# Patient Record
Sex: Male | Born: 1955
Health system: Southern US, Community
[De-identification: ages and names within clinical notes are randomized; demographics above are authoritative.]

## PROBLEM LIST (undated history)

## (undated) DIAGNOSIS — F410 Panic disorder [episodic paroxysmal anxiety] without agoraphobia: Secondary | ICD-10-CM

## (undated) DIAGNOSIS — Z85828 Personal history of other malignant neoplasm of skin: Secondary | ICD-10-CM

## (undated) DIAGNOSIS — F329 Major depressive disorder, single episode, unspecified: Secondary | ICD-10-CM

## (undated) DIAGNOSIS — K219 Gastro-esophageal reflux disease without esophagitis: Secondary | ICD-10-CM

## (undated) DIAGNOSIS — J849 Interstitial pulmonary disease, unspecified: Secondary | ICD-10-CM

## (undated) DIAGNOSIS — I719 Aortic aneurysm of unspecified site, without rupture: Secondary | ICD-10-CM

## (undated) DIAGNOSIS — I10 Essential (primary) hypertension: Secondary | ICD-10-CM

## (undated) DIAGNOSIS — E119 Type 2 diabetes mellitus without complications: Secondary | ICD-10-CM

## (undated) DIAGNOSIS — F32A Depression, unspecified: Secondary | ICD-10-CM

## (undated) DIAGNOSIS — E785 Hyperlipidemia, unspecified: Secondary | ICD-10-CM

## (undated) DIAGNOSIS — J189 Pneumonia, unspecified organism: Secondary | ICD-10-CM

## (undated) DIAGNOSIS — J449 Chronic obstructive pulmonary disease, unspecified: Secondary | ICD-10-CM

## (undated) DIAGNOSIS — J439 Emphysema, unspecified: Secondary | ICD-10-CM

## (undated) HISTORY — DX: Gastro-esophageal reflux disease without esophagitis: K21.9

## (undated) HISTORY — DX: Essential (primary) hypertension: I10

## (undated) HISTORY — DX: Chronic obstructive pulmonary disease, unspecified: J44.9

## (undated) HISTORY — DX: Interstitial pulmonary disease, unspecified: J84.9

## (undated) HISTORY — DX: Hyperlipidemia, unspecified: E78.5

## (undated) HISTORY — DX: Personal history of other malignant neoplasm of skin: Z85.828

## (undated) HISTORY — DX: Panic disorder (episodic paroxysmal anxiety): F41.0

## (undated) HISTORY — DX: Depression, unspecified: F32.A

## (undated) HISTORY — PX: OTHER SURGICAL HISTORY: SHX169

## (undated) HISTORY — DX: Type 2 diabetes mellitus without complications: E11.9

## (undated) HISTORY — DX: Major depressive disorder, single episode, unspecified: F32.9

## (undated) HISTORY — PX: HERNIA REPAIR: SHX51

---

## 1998-09-18 ENCOUNTER — Ambulatory Visit (HOSPITAL_COMMUNITY): Admission: RE | Admit: 1998-09-18 | Discharge: 1998-09-18 | Payer: Self-pay | Admitting: Psychiatry

## 2005-10-16 LAB — HM COLONOSCOPY: HM Colonoscopy: NORMAL

## 2005-11-09 ENCOUNTER — Inpatient Hospital Stay (HOSPITAL_COMMUNITY): Admission: EM | Admit: 2005-11-09 | Discharge: 2005-11-10 | Payer: Self-pay | Admitting: Emergency Medicine

## 2005-11-15 ENCOUNTER — Ambulatory Visit (HOSPITAL_COMMUNITY): Admission: RE | Admit: 2005-11-15 | Discharge: 2005-11-15 | Payer: Self-pay | Admitting: Interventional Cardiology

## 2006-12-31 ENCOUNTER — Ambulatory Visit (HOSPITAL_COMMUNITY): Admission: RE | Admit: 2006-12-31 | Discharge: 2006-12-31 | Payer: Self-pay | Admitting: Family Medicine

## 2008-07-16 HISTORY — PX: BRONCHOSCOPY: SUR163

## 2008-08-03 ENCOUNTER — Inpatient Hospital Stay (HOSPITAL_COMMUNITY): Admission: EM | Admit: 2008-08-03 | Discharge: 2008-08-21 | Payer: Self-pay | Admitting: Emergency Medicine

## 2008-08-03 ENCOUNTER — Ambulatory Visit: Payer: Self-pay | Admitting: Pulmonary Disease

## 2008-08-12 ENCOUNTER — Ambulatory Visit: Payer: Self-pay | Admitting: Cardiovascular Disease

## 2008-08-12 ENCOUNTER — Encounter (INDEPENDENT_AMBULATORY_CARE_PROVIDER_SITE_OTHER): Payer: Self-pay | Admitting: Pulmonary Disease

## 2008-08-26 ENCOUNTER — Ambulatory Visit: Payer: Self-pay | Admitting: Pulmonary Disease

## 2008-08-26 DIAGNOSIS — E119 Type 2 diabetes mellitus without complications: Secondary | ICD-10-CM

## 2008-08-26 DIAGNOSIS — J8410 Pulmonary fibrosis, unspecified: Secondary | ICD-10-CM | POA: Insufficient documentation

## 2008-08-26 DIAGNOSIS — R0902 Hypoxemia: Secondary | ICD-10-CM

## 2008-08-26 DIAGNOSIS — I10 Essential (primary) hypertension: Secondary | ICD-10-CM

## 2008-08-26 DIAGNOSIS — J9691 Respiratory failure, unspecified with hypoxia: Secondary | ICD-10-CM | POA: Insufficient documentation

## 2008-09-16 ENCOUNTER — Telehealth (INDEPENDENT_AMBULATORY_CARE_PROVIDER_SITE_OTHER): Payer: Self-pay | Admitting: *Deleted

## 2008-09-23 ENCOUNTER — Ambulatory Visit: Payer: Self-pay | Admitting: Pulmonary Disease

## 2008-09-23 DIAGNOSIS — J449 Chronic obstructive pulmonary disease, unspecified: Secondary | ICD-10-CM

## 2008-09-23 HISTORY — DX: Chronic obstructive pulmonary disease, unspecified: J44.9

## 2008-09-30 ENCOUNTER — Ambulatory Visit: Payer: Self-pay | Admitting: Pulmonary Disease

## 2008-09-30 ENCOUNTER — Ambulatory Visit: Payer: Self-pay | Admitting: Internal Medicine

## 2008-12-07 ENCOUNTER — Ambulatory Visit: Payer: Self-pay | Admitting: Pulmonary Disease

## 2008-12-07 DIAGNOSIS — J438 Other emphysema: Secondary | ICD-10-CM | POA: Insufficient documentation

## 2008-12-16 ENCOUNTER — Telehealth (INDEPENDENT_AMBULATORY_CARE_PROVIDER_SITE_OTHER): Payer: Self-pay | Admitting: *Deleted

## 2009-02-08 ENCOUNTER — Ambulatory Visit: Payer: Self-pay | Admitting: Pulmonary Disease

## 2009-03-11 ENCOUNTER — Ambulatory Visit: Payer: Self-pay | Admitting: Pulmonary Disease

## 2009-04-22 ENCOUNTER — Ambulatory Visit: Payer: Self-pay | Admitting: Pulmonary Disease

## 2009-07-21 ENCOUNTER — Ambulatory Visit: Payer: Self-pay | Admitting: Pulmonary Disease

## 2009-08-24 ENCOUNTER — Ambulatory Visit: Payer: Self-pay | Admitting: Pulmonary Disease

## 2009-09-13 ENCOUNTER — Encounter: Payer: Self-pay | Admitting: Pulmonary Disease

## 2009-11-17 ENCOUNTER — Ambulatory Visit: Payer: Self-pay | Admitting: Pulmonary Disease

## 2010-04-20 ENCOUNTER — Ambulatory Visit: Payer: Self-pay | Admitting: Pulmonary Disease

## 2010-04-27 ENCOUNTER — Encounter: Payer: Self-pay | Admitting: Pulmonary Disease

## 2010-04-28 ENCOUNTER — Encounter: Payer: Self-pay | Admitting: Pulmonary Disease

## 2010-05-05 ENCOUNTER — Ambulatory Visit: Payer: Self-pay | Admitting: Internal Medicine

## 2010-10-18 ENCOUNTER — Ambulatory Visit
Admission: RE | Admit: 2010-10-18 | Discharge: 2010-10-18 | Payer: Self-pay | Source: Home / Self Care | Attending: Pulmonary Disease | Admitting: Pulmonary Disease

## 2010-10-18 DIAGNOSIS — R079 Chest pain, unspecified: Secondary | ICD-10-CM | POA: Insufficient documentation

## 2010-11-17 NOTE — Assessment & Plan Note (Signed)
Summary: 4-6 months/apc   Visit Type:  Follow-up  CC:  Emphysema.  ILD.  The patient c/o increased sob and dizziness. Cough with clear mucus that is worse in the mornings. The patient says that he has had a few episodes when first waking in the mornings with SOB.Marland Kitchen  History of Present Illness: I saw [name] in follow up for his pneumonitis and COPD/emphysema.  He has occasional cough with clear sputum.  He denies fever, sweats, hemoptysis, or chest pain.  He gets an occasional whistle in his chest.  He is not using combivent much, but this helps when he uses it.  He is not smoking.  He uses his oxygen with exertion, and this helps.  He has not seen his primary care physician for more than one year, and his previous PCP is no longer practicing in the [place] area.   Current Medications (verified): 1)  Combivent 103-18 Mcg/act Aero (Ipratropium-Albuterol) .... As Needed 2)  Adult Aspirin Ec Low Strength 81 Mg Tbec (Aspirin) .... Once Daily 3)  Hydrochlorothiazide 25 Mg Tabs (Hydrochlorothiazide) .Marland Kitchen.. 1 By Mouth Daily 4)  Oxygen .... Continuous 1.5 Liters With Activity  Allergies (verified): 1)  ! * Latex 2)  ! Morphine 3)  ! * Dalmamine 4)  ! * Statins  Past History:  Past Medical History: Reviewed history from 03/11/2009 and no changes required. HTN      - Echo 10/09 EF 60% Dyslipidemia GERD Panic attacks ILD COPD      - PFT 09/23/08 FEV1 2.89 (84%), FVC 3.70 (78%), TLC 5.28 (78%), DLCO 64%;mixed obstructive and        restrictive pattern     - Spirometry 03/11/09 FEV1 3.35 (97%), FVC 4.10 (86%), FEV1% 82 Steroid responsive pneumonitis 04/09     - required mechanical ventilation during hospitalization     - The following tests during his hospitalization were negative: PCP, ANA, HIV, BAL     - ACE 25, RF 20, CRP 28, A1AT 263, ESR 109 Depression  Past Surgical History: Reviewed history from 08/26/2008 and no changes required. 2 hernia Left shoulder Ganglion cyst on  foot skin cancer Ear surgery Bronchoscopy 10/09  Vital Signs:  Patient profile:   55 year old male Height:      70 inches (177.80 cm) Weight:      217 pounds (98.64 kg) BMI:     31.25 O2 Sat:      97 % on 1.5 L/min Temp:     98.1 degrees F (36.72 degrees C) oral Pulse rate:   119 / minute BP sitting:   120 / 80  (right arm) Cuff size:   regular  Vitals Entered By: Michel Bickers CMA (April 20, 2010 2:53 PM)  O2 Sat at Rest %:  97 O2 Flow:  1.5 L/min CC: Emphysema.  ILD.  The patient c/o increased sob and dizziness. Cough with clear mucus that is worse in the mornings. The patient says that he has had a few episodes when first waking in the mornings with SOB. Comments Medications reviewed and phone number verified. Michel Bickers CMA  April 20, 2010 2:54 PM   Physical Exam  General:  on supplemental oxygen and obese.   Nose:  Narrow nasal angle, no discharge. Mouth:  no deformity or lesions Neck:  no JVD.   Lungs:  Decreased breath sounds, shallow respirations, coarse breath sounds, no wheezing Heart:  regular rhythm, normal rate, and no murmurs.   Extremities:  no clubbing, cyanosis,  edema, or deformity noted Cervical Nodes:  no significant adenopathy   Impression & Recommendations:  Problem # 1:  EMPHYSEMA (ICD-492.8)  Continue on as needed combivent.  Orders: Est. Patient Level III (16109) Primary Care Referral (Primary)  Problem # 2:  INTERSTITIAL LUNG DISEASE (ICD-515)  Stable.  Continue clinical observation.  Problem # 3:  HYPOXEMIA (ICD-799.02)  continue oxygen 1.5 liters as needed with exertion.  will re-assess need for home oxygen at next follow up.  Orders: Est. Patient Level III (60454) Primary Care Referral (Primary)  Problem # 4:  HYPERTENSION (ICD-401.9) he no longer has a primary care physician.  will see if he can get set up with a PCP with [facility].  Complete Medication List: 1)  Combivent 103-18 Mcg/act Aero (Ipratropium-albuterol) .... As  needed 2)  Adult Aspirin Ec Low Strength 81 Mg Tbec (Aspirin) .... Once daily 3)  Hydrochlorothiazide 25 Mg Tabs (Hydrochlorothiazide) .Marland Kitchen.. 1 by mouth daily 4)  Oxygen  .... Continuous 1.5 liters with activity  Patient Instructions: 1)  Will arrange for referral to primary care doctor 2)  Combivent two puffs up to four times per day as needed  3)  Follow up in 6 months

## 2010-11-17 NOTE — Procedures (Signed)
Summary: Oximetry/[facility]  Oximetry/[facility]   Imported By: Sherian Rein 05/12/2010 09:52:55  _____________________________________________________________________  External Attachment:    Type:   Image     Comment:   External Document

## 2010-11-17 NOTE — Assessment & Plan Note (Signed)
Summary: rov ////kp   Primary Provider/Referring Provider:  Ace Gins  CC:  Pt here for follow up. Pt states no improvement from last OV. Pt c/o S.O.B with activity.  History of Present Illness: I saw [name] in follow up for his pneumonitis and COPD/emphysema.  His breathing has been about the same.  He gets occasional cough and wheeze.  He brings up clear sputum in the morning.  He denies fever, chest pain, or hemoptysis.  He uses his combivent about 3 or 4 times per week.  He does get winded when he exerts himself too much.     Current Medications (verified): 1)  Combivent 103-18 Mcg/act Aero (Ipratropium-Albuterol) .... As Needed 2)  Prilosec Otc 20 Mg Tbec (Omeprazole Magnesium) .... Once Daily As Needed 3)  Adult Aspirin Ec Low Strength 81 Mg Tbec (Aspirin) .... Once Daily 4)  Hydrochlorothiazide 25 Mg Tabs (Hydrochlorothiazide) .Marland Kitchen.. 1 By Mouth Daily 5)  Oxygen .... Continuous 1.5 Liters With Activity  Allergies (verified): 1)  ! * Latex 2)  ! Morphine 3)  ! * Dalmamine 4)  ! * Statins  Past History:  Past Medical History: Reviewed history from 03/11/2009 and no changes required. HTN      - Echo 10/09 EF 60% Dyslipidemia GERD Panic attacks ILD COPD      - PFT 09/23/08 FEV1 2.89 (84%), FVC 3.70 (78%), TLC 5.28 (78%), DLCO 64%;mixed obstructive and        restrictive pattern     - Spirometry 03/11/09 FEV1 3.35 (97%), FVC 4.10 (86%), FEV1% 82 Steroid responsive pneumonitis 04/09     - required mechanical ventilation during hospitalization     - The following tests during his hospitalization were negative: PCP, ANA, HIV, BAL     - ACE 25, RF 20, CRP 28, A1AT 263, ESR 109 Depression  Past Surgical History: Reviewed history from 08/26/2008 and no changes required. 2 hernia Left shoulder Ganglion cyst on foot skin cancer Ear surgery Bronchoscopy 10/09  Vital Signs:  Patient profile:   55 year old male Height:      70 inches Weight:      223.13  pounds O2 Sat:      97 % on Room air Temp:     98.9 degrees F oral Pulse rate:   108 / minute BP sitting:   148 / 92  (left arm) Cuff size:   regular  Vitals Entered By: Zackery Barefoot CMA (November 17, 2009 1:35 PM)  O2 Flow:  Room air CC: Pt here for follow up. Pt states no improvement from last OV. Pt c/o S.O.B with activity Comments Medications reviewed with patient Verified pt's contact number Zackery Barefoot CMA  November 17, 2009 1:39 PM   Ambulatory Pulse Oximetry  Resting; HR_110____    02 Sat_96%ra____  Lap1 (185 feet)   HR__95___   02 Sat__93%ra___ Lap2 (185 feet)   HR__106___   02 Sat__95%ra___    Lap3 (185 feet)   HR__100___   02 Sat__95%ra___  _x__Test Completed without Difficulty ___Test Stopped due to:  Gweneth Dimitri RN  November 17, 2009 1:56 PM    Physical Exam  Nose:  Narrow nasal angle, no discharge. Mouth:  no deformity or lesions Neck:  no JVD.   Lungs:  Decreased breath sounds, shallow respirations, coarse breath sounds, no wheezing Heart:  regular rhythm, normal rate, and no murmurs.   Abdomen:  bowel sounds positive; abdomen soft and non-tender without masses, or organomegaly Extremities:  no  clubbing, cyanosis, edema, or deformity noted Cervical Nodes:  no significant adenopathy   Impression & Recommendations:  Problem # 1:  EMPHYSEMA (ICD-492.8)  Continue on as needed combivent.  Problem # 2:  INTERSTITIAL LUNG DISEASE (ICD-515)  Stable.  Continue clinical observation.  Problem # 3:  HYPOXEMIA (ICD-799.02) Oxygen level on room air was okay at rest and exertion.  Will arrange for ONO on room air.  Medications Added to Medication List This Visit: 1)  Combivent 103-18 Mcg/act Aero (Ipratropium-albuterol) .... As needed 2)  Oxygen  .... Continuous 1.5 liters with activity  Complete Medication List: 1)  Combivent 103-18 Mcg/act Aero (Ipratropium-albuterol) .... As needed 2)  Prilosec Otc 20 Mg Tbec (Omeprazole magnesium) .... Once  daily as needed 3)  Adult Aspirin Ec Low Strength 81 Mg Tbec (Aspirin) .... Once daily 4)  Hydrochlorothiazide 25 Mg Tabs (Hydrochlorothiazide) .Marland Kitchen.. 1 by mouth daily 5)  Oxygen  .... Continuous 1.5 liters with activity  Other Orders: Est. Patient Level III (16109) DME Referral (DME)  Patient Instructions: 1)  Follow up in 4 to 6 months   Immunization History:  Pneumovax Immunization History:    Pneumovax:  historical (08/17/2008)

## 2010-11-17 NOTE — Assessment & Plan Note (Signed)
Summary: NEW/ NO INSU/ $184/[facility]WS  #   Vital Signs:  Patient profile:   55 year old male Height:      70 inches Weight:      214.13 pounds BMI:     30.84 O2 Sat:      98 % on 2 L/min Temp:     98.0 degrees F oral Pulse rate:   80 / minute Pulse rhythm:   regular Resp:     16 per minute BP sitting:   118 / 80  (left arm) Cuff size:   large  Vitals Entered By: Rock Nephew CMA (May 05, 2010 4:41 PM)  Nutrition Counseling: Patient's BMI is greater than 25 and therefore counseled on weight management options.  O2 Flow:  2 L/min  Primary Care Provider:  Etta Grandchild MD   History of Present Illness:  Hypertension Follow-Up      This is a 55 year old man who presents for Hypertension follow-up.  The patient denies lightheadedness, urinary frequency, headaches, edema, rash, and fatigue.  Associated symptoms include exercise intolerance.  The patient denies the following associated symptoms: chest pain, chest pressure, dyspnea, palpitations, syncope, leg edema, and pedal edema.  Compliance with medications (by patient report) has been near 100%.  The patient reports that dietary compliance has been good.  The patient reports exercising 3-4X per week.  Adjunctive measures currently used by the patient include salt restriction and relaxation.    Preventive Screening-Counseling & Management  Alcohol-Tobacco     Alcohol drinks/day: <1     Alcohol type: all     >5/day in last 3 mos: no     Alcohol Counseling: not indicated; use of alcohol is not excessive or problematic     Feels need to cut down: no     Feels annoyed by complaints: no     Feels guilty re: drinking: no     Needs 'eye opener' in am: no     Smoking Status: quit     Year Quit: 2005     Pack years: 15y 1pk a day or more     Tobacco Counseling: to remain off tobacco products  Caffeine-Diet-Exercise     Does Patient Exercise: yes  Hep-HIV-STD-Contraception     Hepatitis Risk: no risk noted     HIV Risk: no risk  noted     STD Risk: no risk noted      Drug Use:  no.        Blood Transfusions:  no.    Medications Prior to Update: 1)  Combivent 103-18 Mcg/act Aero (Ipratropium-Albuterol) .... As Needed 2)  Adult Aspirin Ec Low Strength 81 Mg Tbec (Aspirin) .... Once Daily 3)  Hydrochlorothiazide 25 Mg Tabs (Hydrochlorothiazide) .Marland Kitchen.. 1 By Mouth Daily 4)  Oxygen .... Continuous 1.5 Liters With Activity  Current Medications (verified): 1)  Combivent 103-18 Mcg/act Aero (Ipratropium-Albuterol) .... As Needed 2)  Adult Aspirin Ec Low Strength 81 Mg Tbec (Aspirin) .... Once Daily 3)  Hydrochlorothiazide 25 Mg Tabs (Hydrochlorothiazide) .Marland Kitchen.. 1 By Mouth Daily 4)  Oxygen .... Continuous 1.5 Liters With Activity  Allergies (verified): 1)  ! * Latex 2)  ! Morphine 3)  ! * Dalmamine 4)  ! * Statins  Past History:  Past Medical History: Last updated: 03/11/2009 HTN      - Echo 10/09 EF 60% Dyslipidemia GERD Panic attacks ILD COPD      - PFT 09/23/08 FEV1 2.89 (84%), FVC 3.70 (78%), TLC 5.28 (78%), DLCO 64%;mixed  obstructive and        restrictive pattern     - Spirometry 03/11/09 FEV1 3.35 (97%), FVC 4.10 (86%), FEV1% 82 Steroid responsive pneumonitis 04/09     - required mechanical ventilation during hospitalization     - The following tests during his hospitalization were negative: PCP, ANA, HIV, BAL     - ACE 25, RF 20, CRP 28, A1AT 263, ESR 109 Depression  Past Surgical History: Last updated: 08/26/2008 2 hernia Left shoulder Ganglion cyst on foot skin cancer Ear surgery Bronchoscopy 10/09  Family History: Last updated: 12/07/2008 Mother - 1, fire accident Father - [place] age 67 Brother - [place] Sister - allergy, asthma Brother - Emphysema  Social History: Last updated: 05/05/2010 Divorced.  No children. 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 Mother - 22, fire accident Father - MI age 29 Brother - MI Sister - allergy, asthma Brother - Emphysema  Social History: Reviewed history from 08/26/2008 and no changes required. Divorced.  No children. 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  The patient denies anorexia, fever, weight loss, weight gain, chest pain, prolonged cough, headaches, hemoptysis, abdominal pain, hematuria, difficulty walking, and depression.    Physical Exam  General:  alert, well-developed, well-nourished, well-hydrated, appropriate dress, normal appearance, and healthy-appearing.   Head:  normocephalic and atraumatic.   Mouth:  no deformity or lesions Neck:  no JVD.   Lungs:  Decreased breath sounds, shallow respirations, coarse breath sounds, no wheezing Heart:  regular rhythm, normal rate, and no murmurs.   Abdomen:  bowel sounds positive; abdomen soft and non-tender without masses, or organomegaly Msk:  normal ROM, no joint tenderness, no joint swelling, no joint warmth, and no redness over joints.   Pulses:  pulses normal Extremities:  no clubbing, cyanosis, edema, or deformity noted Skin:  turgor normal, color normal, no rashes, no suspicious lesions, no ecchymoses, no petechiae, no purpura, no ulcerations, and no edema.   Cervical Nodes:  no anterior cervical adenopathy and no posterior cervical adenopathy.   Psych:  Cognition and judgment appear intact. Alert and cooperative with normal attention span and concentration. No apparent delusions, illusions, hallucinations  Diabetes  Management Exam:    Foot Exam (with socks and/or shoes not present):       Sensory-Pinprick/Light touch:          Left medial foot (L-4): normal          Left dorsal foot (L-5): normal          Left lateral foot (S-1): normal          Right medial foot (L-4): normal          Right dorsal foot (L-5): normal          Right lateral foot (S-1): normal       Sensory-Monofilament:          Left foot: normal          Right foot: normal  Inspection:          Left foot: normal          Right foot: normal       Nails:          Left foot: normal          Right foot: normal   Impression & Recommendations:  Problem # 1:  HYPERTENSION (ICD-401.9) Assessment Improved  His updated medication list for this problem includes:    Hydrochlorothiazide 25 Mg Tabs (Hydrochlorothiazide) .Marland Kitchen... 1 by mouth daily  BP today: 118/80 Prior BP: 120/80 (04/20/2010)  Problem # 2:  EMPHYSEMA (ICD-492.8) Assessment: Unchanged  Complete Medication List: 1)  Combivent 103-18 Mcg/act Aero (Ipratropium-albuterol) .... As needed 2)  Adult Aspirin Ec Low Strength 81 Mg Tbec (Aspirin) .... Once daily 3)  Hydrochlorothiazide 25 Mg Tabs (Hydrochlorothiazide) .Marland Kitchen.. 1 by mouth daily 4)  Oxygen  .... Continuous 1.5 liters with activity  Patient Instructions: 1)  Please schedule a follow-up appointment in 2 months. 2)  Check your blood sugars regularly. If your readings are usually above 200  or below 70 you should contact our office. 3)  It is important that your Diabetic A1c level is checked every 3 months. 4)  See your eye doctor yearly to check for diabetic eye damage. 5)  Check your feet each night for sore areas, calluses or signs of infection. 6)  Check your Blood Pressure regularly. If it is above 130/80: you should make an appointment. Prescriptions: COMBIVENT 103-18 MCG/ACT AERO (IPRATROPIUM-ALBUTEROL) as needed  #15 Gram x 11   Entered and Authorized by:   Etta Grandchild MD   Signed by:   Etta Grandchild MD on 05/05/2010   Method used:   Print then Give to Patient   RxID:   [id number]   Preventive Care Screening  Last Tetanus Booster:    Date:  10/16/2005    Results:  Historical   Colonoscopy:    Date:  10/16/2005    Results:  normal

## 2010-11-17 NOTE — Assessment & Plan Note (Signed)
Summary: f/u 6 months///kp   Primary Provider/Referring Provider:  Etta Grandchild MD  CC:  6 month follow up. Pt c/o intermittent sharp to sore chest discomfort, breathing is the same but has increased use of Combivent and oxygen liters, and dizziness.  History of Present Illness: I saw [name] in follow up for his pneumonitis and COPD/emphysema.  He developed allergies and cough with sputum about one month ago.  He did not seek therapy for this.  While he was having this he developed pleuritic type chest pain which would typical last a few minutes.  His cough and chest pain have improved.  He was also having fever and sinus congestion, but these have improved also.  He denies hemoptysis.  He is using his combivent 3 or 4 times per week.  He uses his oxygen with exeriton.  He has gotten approval for medicare, and this will take effect in April 2012.  Preventive Screening-Counseling & Management  Alcohol-Tobacco     Alcohol drinks/day: <1     Alcohol type: all     Smoking Status: quit     Year Quit: 2005     Pack years: 15y 1pk a day or more  Current Medications (verified): 1)  Combivent 103-18 Mcg/act Aero (Ipratropium-Albuterol) .... As Needed 2)  Adult Aspirin Ec Low Strength 81 Mg Tbec (Aspirin) .... Once Daily 3)  Hydrochlorothiazide 25 Mg Tabs (Hydrochlorothiazide) .Marland Kitchen.. 1 By Mouth Daily 4)  Oxygen .... Continuous 1.5 Liters With Activity  Allergies (verified): 1)  ! * Latex 2)  ! Morphine 3)  ! * Dalmamine 4)  ! * Statins  Past History:  Past Medical History: Reviewed history from 03/11/2009 and no changes required. HTN      - Echo 10/09 EF 60% Dyslipidemia GERD Panic attacks ILD COPD      - PFT 09/23/08 FEV1 2.89 (84%), FVC 3.70 (78%), TLC 5.28 (78%), DLCO 64%;mixed obstructive and        restrictive pattern     - Spirometry 03/11/09 FEV1 3.35 (97%), FVC 4.10 (86%), FEV1% 82 Steroid responsive pneumonitis 04/09     - required mechanical ventilation during  hospitalization     - The following tests during his hospitalization were negative: PCP, ANA, HIV, BAL     - ACE 25, RF 20, CRP 28, A1AT 263, ESR 109 Depression  Past Surgical History: Reviewed history from 08/26/2008 and no changes required. 2 hernia Left shoulder Ganglion cyst on foot skin cancer Ear surgery Bronchoscopy 10/09  Vital Signs:  Patient profile:   55 year old male Height:      70 inches Weight:      216.4 pounds BMI:     31.16 O2 Sat:      98 % on 1.5 L/min pulsed Temp:     97.8 degrees F oral Pulse rate:   106 / minute BP sitting:   132 / 84  (left arm) Cuff size:   large  Vitals Entered By: [name] CMA (October 18, 2010 1:59 PM)  O2 Flow:  1.5 L/min pulsed CC: 6 month follow up. Pt c/o intermittent sharp to sore chest discomfort, breathing is the same but has increased use of Combivent and oxygen liters, dizziness Comments Medications reviewed with patient Verified contact number and pharmacy with patient [name] CMA  October 18, 2010 1:59 PM    Physical Exam  General:  on supplemental oxygen and obese.   Nose:  Narrow nasal angle, no discharge. Mouth:  no deformity or lesions Neck:  no JVD.   Lungs:  Decreased breath sounds, shallow respirations, coarse breath sounds, no wheezing Heart:  regular rhythm, normal rate, and no murmurs.   Extremities:  no clubbing, cyanosis, edema, or deformity noted Neurologic:  normal CN II-XII and strength normal.   Cervical Nodes:  no significant adenopathy Psych:  alert and cooperative; normal mood and affect; normal attention span and concentration   Impression & Recommendations:  Problem # 1:  EMPHYSEMA (ICD-492.8) Continue combivent.  Will re-assess with additional PFT in April.  Problem # 2:  INTERSTITIAL LUNG DISEASE (ICD-515)  Stable.  Continue clinical observation.  Will re-assess at next visit.  Problem # 3:  HYPOXEMIA (ICD-799.02) Continue oxygen with exertion.  Will re-assess at  next visit.  Problem # 4:  CHEST PAIN (ICD-786.50) His recent symptoms were likely related to bronchitis.  He has improved clinically.  I have recommended clinical observation.  I don't think he needs a chest xray at this time.  Other Orders: Est. Patient Level III (54098)  Patient Instructions: 1)  Follow up in April 2012   Immunization History:  Influenza Immunization History:    Influenza:  historical (08/22/2010)   Prevention & Chronic Care Immunizations   Influenza vaccine: Historical  (08/22/2010)    Tetanus booster: 10/16/2005: Historical    Pneumococcal vaccine: Historical  (08/17/2008)  Colorectal Screening   Hemoccult: Not documented    Colonoscopy: normal  (10/16/2005)  Other Screening   PSA: Not documented   Smoking status: quit  (10/18/2010)  Diabetes Mellitus   HgbA1C: Not documented    Eye exam: Not documented    Foot exam: yes  (05/05/2010)   High risk foot: Not documented   Foot care education: Not documented    Urine microalbumin/creatinine ratio: Not documented  Lipids   Total Cholesterol: Not documented   LDL: Not documented   LDL Direct: Not documented   HDL: Not documented   Triglycerides: Not documented  Hypertension   Last Blood Pressure: 132 / 84  (10/18/2010)   Serum creatinine: Not documented   Serum potassium Not documented  Self-Management Support :    Diabetes self-management support: Not documented    Hypertension self-management support: Not documented

## 2010-11-17 NOTE — Miscellaneous (Signed)
Summary: Overnight oximetry on room air  Clinical Lists Changes Test time 10hrs 18 min.  Mean SpO2 92%, lowest 83%.  Spent 1hr 13 min(12%) with SpO2 < 90%, and 52(1.1%) sec with SpO2 < 88%.  Results d/w pt over the phone.  Advised to continue with current use of supplemental oxygen with exertion and sleep.

## 2010-11-23 ENCOUNTER — Other Ambulatory Visit: Payer: Self-pay

## 2010-11-23 ENCOUNTER — Ambulatory Visit (INDEPENDENT_AMBULATORY_CARE_PROVIDER_SITE_OTHER): Payer: Self-pay | Admitting: Internal Medicine

## 2010-11-23 ENCOUNTER — Other Ambulatory Visit: Payer: Self-pay | Admitting: Internal Medicine

## 2010-11-23 ENCOUNTER — Encounter: Payer: Self-pay | Admitting: Internal Medicine

## 2010-11-23 ENCOUNTER — Encounter (INDEPENDENT_AMBULATORY_CARE_PROVIDER_SITE_OTHER): Payer: Self-pay | Admitting: *Deleted

## 2010-11-23 DIAGNOSIS — E119 Type 2 diabetes mellitus without complications: Secondary | ICD-10-CM

## 2010-11-23 DIAGNOSIS — F4322 Adjustment disorder with anxiety: Secondary | ICD-10-CM | POA: Insufficient documentation

## 2010-11-23 DIAGNOSIS — R0902 Hypoxemia: Secondary | ICD-10-CM

## 2010-11-23 DIAGNOSIS — I10 Essential (primary) hypertension: Secondary | ICD-10-CM

## 2010-11-23 LAB — HM DIABETES FOOT EXAM

## 2010-11-23 LAB — HEPATIC FUNCTION PANEL
ALT: 26 U/L (ref 0–53)
AST: 20 U/L (ref 0–37)
Alkaline Phosphatase: 71 U/L (ref 39–117)
Bilirubin, Direct: 0 mg/dL (ref 0.0–0.3)
Total Bilirubin: 0.5 mg/dL (ref 0.3–1.2)

## 2010-11-23 LAB — CBC WITH DIFFERENTIAL/PLATELET
Basophils Absolute: 0.1 10*3/uL (ref 0.0–0.1)
Eosinophils Relative: 2.9 % (ref 0.0–5.0)
HCT: 45.7 % (ref 39.0–52.0)
Lymphocytes Relative: 22.4 % (ref 12.0–46.0)
Monocytes Relative: 8.5 % (ref 3.0–12.0)
Neutrophils Relative %: 64.8 % (ref 43.0–77.0)
Platelets: 252 10*3/uL (ref 150.0–400.0)
RDW: 13.5 % (ref 11.5–14.6)
WBC: 8.7 10*3/uL (ref 4.5–10.5)

## 2010-11-23 LAB — BASIC METABOLIC PANEL
BUN: 15 mg/dL (ref 6–23)
Calcium: 9.8 mg/dL (ref 8.4–10.5)
GFR: 79.14 mL/min (ref >60.00)
Glucose, Bld: 101 mg/dL — ABNORMAL HIGH (ref 70–99)
Potassium: 4.1 mEq/L (ref 3.5–5.1)

## 2010-11-23 LAB — HEMOGLOBIN A1C: Hgb A1c MFr Bld: 5.8 % (ref 4.6–6.5)
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
## 2011-02-28 NOTE — Discharge Summary (Signed)
NAMEMAKALE, PINDELL                ACCOUNT NO.:  [id number]   MEDICAL RECORD NO.:  [id number]          PATIENT TYPE:  INP   LOCATION:  3038                         FACILITY:  [facility]   PHYSICIAN:  Oley Balm. Sung Amabile, MD   DATE OF BIRTH:  30-Aug-1956   DATE OF ADMISSION:  08/02/2008  DATE OF DISCHARGE:  08/18/2008                               DISCHARGE SUMMARY   FINAL DIAGNOSES:  1. Pneumonitis with resultant acute respiratory failure/ventilator-      dependent respiratory failure.  2. Hypertension.   PROCEDURES:  1. Right subclavian triple lumen catheter placed, August 04, 2008.  2. Endotracheal tube placed, August 04, 2008.  3. Foley catheter placed, August 04, 2008.  4. Oral gastric tube placed, August 04, 2008.   LABORATORY DATA:  Pneumocystis smear on August 04, 2008, negative.  Bronchioalveolar lavage on August 04, 2008, negative.  Blood cultures  on August 04, 2008 negative.  On August 17, 2008; sodium 140,  potassium 3.8, chloride 108, CO2 of 24, glucose 144, BUN 24, creatinine  0.82.  White blood cell count 16.4, hemoglobin 10.4, hematocrit 30.4,  platelet count 485.  Angiotensin-converting enzyme 25, this was obtained  on August 12, 2008.  ANA on August 12, 2008 negative.  Rheumatoid  factor less than 20 on August 12, 2008.  C-reactive protein August 12, 2008, 9.5.  Alpha 1-antitrypsin 263.  ESR 109.  HIV antibody  nonreactive.  BNP 108.   BRIEF HISTORY:  This is a 55 year old male patient admitted on August 03, 2008, with a diagnosis of pneumonia, hypotension, and hypoxemia.  The patient developed persistent desaturation with increased work of  breathing and metabolic acidosis and respiratory alkalosis.  He was  initially admitted on to the [facility] Internal Medicine Service and was  transferred to Critical Care Service for these reasons.  The patient  became progressively hypoxic with increased work of breathing  necessitating endotracheal intubation and  mechanical ventilation.  He  was therefore transferred to the Critical Care Service.   HOSPITAL COURSE:  1. Acute respiratory failure in the setting of pneumonitis of unclear      etiology.  Mr. Florea as previously mentioned was intubated for      acute hypoxic respiratory failure in the setting of diffuse      pulmonary infiltrates.  Given his negative culture findings, he was      empirically placed on high-dose steroids started on August 16, 2008.  He had a dramatic response to steroids and was successfully      extubated following this.  Upon time of critical care sign off,      certainly viral pneumonitis is his potential etiology, additionally      BOOP/COPD are potential reasons for development of pulmonary      infiltrates.  At any rate, the patient is rapidly improving.  He is      currently off antibiotics and now will be transitioned to a      prednisone taper.  Upon time of critical care sign off, he is  currently on 30 mg of Solu-Medrol IV every 6 hours for four doses,      he will then go 30 mg q.12 h. for four doses, then prednisone 40 mg      daily for four doses, then prednisone 20 mg daily to be followed in      the outpatient setting by Pulmonology.  He will require outpatient      followup and followup chest x-ray.  2. Hypertension.  This is currently being managed with his current      antihypertensive regimen.  See MAR.   INSTRUCTIONS UPON TIME OF DISCHARGE:  1. Currently, cleared for physical activity, occupational therapy, and      rehabilitation services.  2. Diet as tolerated.  3. Discharge medications currently will defer until final time of      discharge, which will be eventually dictated by the [facility]      Internal Medicine Service.      Zenia Resides, NP      Oley Balm. Sung Amabile, MD  Electronically Signed    PB/MEDQ  D:  08/19/2008  T:  08/20/2008  Job:  [id number]

## 2011-02-28 NOTE — H&P (Signed)
NAMEJATAVION, PEASTER                ACCOUNT NO.:  [id number]   MEDICAL RECORD NO.:  [id number]          PATIENT TYPE:  EMS   LOCATION:  MAJO                         FACILITY:  [facility]   PHYSICIAN:  Lonia Blood, M.D.      DATE OF BIRTH:  [birth year]   DATE OF ADMISSION:  08/02/2008  DATE OF DISCHARGE:                              HISTORY & PHYSICAL   PRIMARY CARE PHYSICIAN:  [facility].   PRESENTING COMPLAINT:  Fever, cough, and shortness of breath.   HISTORY OF PRESENT ILLNESS:  The patient is a 55 year old man with only  history of hypertension.  The patient came in with complaint of flu-like  symptoms for about a week including fever up to 102, some cough, and  some sore throat.  He has had headaches, some diarrhea, nausea, and  vomiting.  In the last 2 days, the fever resolved, but the patient has  continued to have shortness of breath.  He has also had some cough,  pleuritic chest pain, and sputum which is yellow.  The shortness of  breath has continued.  The symptoms were not relieved by anything.  He  has had some nasal congestion and nausea.  He denied any chest pain.  Denied any hemoptysis.  He denied any other complaint except malaise.   His past medical history is significant only for hypertension.   Allergy to [facility] and MORPHINE that causes itching and nausea.   Medications include fexofenadine 180 mg daily, flunisolide once a day,  hydrochlorothiazide 12.5 mg daily, and lisinopril 40 mg daily.   SOCIAL HISTORY:  The patient lives in [place].  He was a former  smoker, but has not smoked in a while.  Denied alcohol or IV drug use.   FAMILY HISTORY:  Significant for hypertension and coronary artery  disease.  His father died at the age of 66 from coronary artery disease.   REVIEW OF SYSTEMS:  A 12-point review of systems is negative except for  HPI.   PHYSICAL EXAMINATION:  VITAL SIGNS:  Temperature is 97.6, his blood  pressure is currently 82/50,  pulse initially 103, currently 80,  respiratory rate 20, sats 86% on room air.  GENERAL:  The patient is awake, alert, obviously in acute distress.  HEENT:  PERRL.  EOMI.  NECK:  Supple.  No JVD, no lymphadenopathy.  RESPIRATORY:  He has decreased air entry bilaterally with some crackles.  Mild rales.  CARDIOVASCULAR:  Mildly tachycardiac.  ABDOMEN:  Soft and nontender with positive bowel sounds.  EXTREMITIES:  No edema, cyanosis, or clubbing.   His labs showed a white count of 7.0, hemoglobin 14.7, and platelet  count 239.  Sodium is 135, potassium 3.7, chloride 100, BUN 44,  creatinine 1.8, glucose 123 with ionized calcium of 1.16.  His chest x-  ray showed low lung volumes with patchy interstitial and air space  opacities with atypical infection being possible.   ASSESSMENT:  This is a 55 year old gentleman presenting with flu-like  symptoms, febrile illness, and patchy infiltrate suggestive of atypical  pneumonia.  The patient also has significant hypoxia,  active renal  failure, and significant hypotension, which has not responded to initial  fluid therapy.   PLAN:  1. Atypical pneumonia with hypotension.  The patient seemed to have      evidenced what appears to be septic shock at this point.  We will      admit him to step-down unit.  We will start IV Rocephin and      Zithromax and IV fluids.  If his blood pressure does not improve      with fluid resuscitation, I will consider adding a pressor like      dopamine.  I will continue to monitor him very closely.  We will      consider H1N1 as part of his course, although he is not febrile at      this point.  That may have happened earlier and the patient is      having secondary bacterial infection.  2. Hypoxemia.  Again, we will put the patient on oxygen and follow him      closely in the hospital.  If his symptoms worsen, we will not      hesitate to use either BiPAP or intubation as necessary.  I will      check ABG on the  patient as well.  Also, check CT chest to further      characterize his lesions.  3. Acute renal failure.  I will hydrate the patient.  I will follow      his renal function closely.  4. History of hypertension.  I will hold his blood pressure      medications at this point until the patient's blood pressure      stabilizes, but now that he is hypotensive, we will not give him      any blood pressure lowering medicine.      Lonia Blood, M.D.  Electronically Signed     LG/MEDQ  D:  08/03/2008  T:  08/03/2008  Job:  [id number]

## 2011-02-28 NOTE — Discharge Summary (Signed)
NAMEMESSIAH, AHR NO.:  [id number]   MEDICAL RECORD NO.:  [id number]          PATIENT TYPE:  INP   LOCATION:  3038                         FACILITY:  [facility]   PHYSICIAN:  Marcellus Scott, MD     DATE OF BIRTH:  1956-04-20   DATE OF ADMISSION:  08/03/2008  DATE OF DISCHARGE:  08/21/2008                               DISCHARGE SUMMARY   ADDENDUM   PRIMARY MEDICAL DOCTOR:  Ace Gins of [facility].   PRIMARY PULMONOLOGIST:  Coralyn Helling, MD.   This is an addendum to the interim discharge summary that was done by  the pulmonary critical care team on August 19, 2008.  Please refer to  that note for details.  This summary will update the events since  August 19, 2008.   DISCHARGE DIAGNOSES:  1. Steroid-responsive pneumonitis.  2. Right upper lobe chronic interstitial lung disease.  3. Hypoxemia secondary to steroid-responsive pneumonitis.  4. Status post ventilatory-dependent respiratory failure.  5. Status post bronchoalveolitis obliterans with organizing pneumonia.  6. Chronic obstructive pulmonary disease.  7. Rule out obstructive sleep apnea syndrome.  8. Hypertension.  9. Gastroesophageal reflux disease.  10.Anemia.  11.Leukocytosis.   DISCHARGE MEDICATIONS:  1. Enteric-coated aspirin 81 mg p.o. daily.  2. Prilosec over the counter 1 p.o. daily.  3. Combivent 2 puffs inhaled q.i.d.  4. Prednisone taper which is 40 mg p.o. daily from August 22, 2008      for 4 days, then 20 mg p.o. daily until seen by pulmonary      physicians.  5. HCTZ 12.5 mg p.o. daily.  6. Oxygen via nasal cannula 2 liters per minute.   DISCONTINUED MEDICATIONS:  1. Fexofenadine.  2. Flunisolide.  3. Lisinopril.   PERTINENT LABS SINCE August 19 2008:  Chest x-ray on August 20, 2008.  Impression:  Diffuse parenchymal disease persists with findings  suspicious for an acute infiltrate superimposed on chronic disease.   PERTINENT LABS:  Cardiac panel  today x1 negative.  Basic metabolic panel  on August 19, 2008 unremarkable with BUN 21, creatinine 0.79.  CBC on  August 19, 2008 with hemoglobin 11.6, hematocrit 34.2, white blood  cells 10.3, platelets 482.   CONTINUED CONSULTATIONS:  Pulmonary critical care team, Dr. Craige Cotta.   HOSPITAL COURSE AND PATIENT DISPOSITION:  Since August 19, 2008 the  patient's main complaint was one of dyspnea on moderate exertion and  minimally-productive cough.  He has been intermittently hypoxemic both  at rest and mainly on exertion.  This has, however, continued to  gradually improve.  Pulmonary physicians have followed the patient today  and have indicated that from their standpoint the patient can be  discharged on home 02 and a steroid taper as indicated above.  He is to  follow up with them in 1-2 weeks' time.  Arrangements will be made for a  home health RN and home oxygen.  The patient complained of a brief  episode of epigastric sharp pain with retrosternal burning which  spontaneously subsided, especially after burping.  The patient at this  time was found to be  saturating at 93% on room air.  With exertion on  room air his oxygen saturations are anywhere between 85-87%.  At this  time the patient is stable to be discharged and followed up by the  pulmonary physicians and his primary medical doctor as an outpatient.  He has been advised to seek immediate medical attention if there is any  deterioration of his condition.      Marcellus Scott, MD  Electronically Signed     AH/MEDQ  D:  08/21/2008  T:  08/21/2008  Job:  [id number]   cc:   Ace Gins, MD  Coralyn Helling, MD

## 2011-03-03 NOTE — H&P (Signed)
NAMERAYDELL, MANERS                ACCOUNT NO.:  [id number]   MEDICAL RECORD NO.:  [id number]          PATIENT TYPE:  INP   LOCATION:  0103                         FACILITY:  [facility]   PHYSICIAN:  Lonia Blood, M.D.       DATE OF BIRTH:  1956-02-02   DATE OF ADMISSION:  11/09/2005  DATE OF DISCHARGE:                                HISTORY & PHYSICAL   PRIMARY CARE PHYSICIAN:  [name]. Andrey Campanile, M.D.   CHIEF COMPLAINT:  Chest pain.   HISTORY OF PRESENT ILLNESS:  [name] is a 55 year old Caucasian male with  a history of hypertension, tobacco abuse, and hyperlipidemia, who presents  to [facility] emergency room for a couple of episodes of sharp, left-sided  chest pain that lasted for about a couple of minutes and that occurred while  he was working.  He does not associate the pain with the effort, but he does  a lot of physical effort hauling doors.  His pain was associated with  nausea, dizziness, but not with the diaphoresis or dyspnea.  The patient has  no prior cardiac history.  Currently, he is in no acute distress and does  not have any chest pain.   PAST MEDICAL HISTORY:  1.  Hypertension.  2.  Hernia.  3.  Left shoulder surgery.  4.  Hyperlipidemia.  5.  Gastroesophageal reflux disease.  6.  Head injury.   FAMILY HISTORY:  Patient's father died at age 85 with a myocardial  infarction.  The patient's mother died at age 57 in a fire.  The patient has  seven siblings.  All are alive.  One brother had an MI.   SOCIAL HISTORY:  The patient drinks alcohol once a week.  He used to smoke a  pack of cigarettes a day but quit two years ago.  He is divorced and has no  children.  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   He has an allergy to [facility]  He also reports that he is intolerant to  STATINS, they make him have myalgias.   HOME MEDICATIONS:  1.  Micardis.  2.  Aspirin 81 mg daily.  3.  Allegra p.r.n.  4.  Ambien p.r.n. at bedtime for insomnia.   REVIEW OF SYSTEMS:   As per HPI.  All other systems are negative.   PHYSICAL EXAMINATION:  VITAL SIGNS:  Upon admission, temperature 97.1, pulse  101, respirations 19, blood pressure 128/80.  Saturation 100% on room air.  GENERAL:  Patient is well-developed and well-nourished in no acute distress.  Alert and oriented to person, place, and time.  HEENT:  Head is normocephalic and atraumatic.  Pupils are equal, round and  reactive to light and accommodation.  Extraocular muscles are intact.  Sclerae are anicteric.  Conjunctivae pink.  Throat is clear.  NECK:  Without JVD.  No carotid bruits.  No thyromegaly.  CHEST:  Clear to auscultation bilaterally without wheezes, rhonchi, or  crackles.  HEART:  PMI is in the 5th intercostal space.  Heart has a regular rate and  rhythm without murmurs, rubs  or gallops.  ABDOMEN:  Soft, nontender, nondistended.  Bowel sounds are present.  EXTREMITIES:  No edema.  Pulses present.  SKIN:  Warm and dry.  MUSCULOSKELETAL:  Patient has intact muscle bulk and tone.  NEUROLOGIC:  Cranial nerves III-XII are intact.  Strength is 5/5 in all four  extremities.  Sensation is intact.   LABORATORY VALUES:  At the time of admission, sodium is 138, potassium 3.6,  chloride 106, bicarb 22, BUN 10, creatinine 0.9, glucose 103.  White blood  cell count 10.5, hemoglobin 14.3, platelet count 274.  Myoglobin is 96.5, CK-  MB 1.2.  Troponin I less than 0.05.   Portable chest x-ray shows questionable left hilar opacity.   EKG shows normal sinus rhythm.   ASSESSMENT/PLAN:  1.  Chest pain, atypical angina.  Multiple risk factors for coronary artery      disease.  Doubt the patient has an acute coronary syndrome.  The plan is      to admit to telemetry, rule out myocardial infarction.  Continue      aspirin.  Add beta blockers.  Cardiology consultation in the morning for      a possible stress test.  2.  Hypertension:  For now, we will use beta blocker.  3.  Hyperlipidemia:  We will check a  fasting lipid panel and decide about      the need to use statin.      Lonia Blood, M.D.  Electronically Signed     SL/MEDQ  D:  11/09/2005  T:  11/09/2005  Job:  [id number]   cc:   [name]. Andrey Campanile, M.D.  Fax: [phone]

## 2011-03-03 NOTE — Consult Note (Signed)
NAMEHILTON, [name]                ACCOUNT NO.:  [id number]   MEDICAL RECORD NO.:  [id number]          PATIENT TYPE:  INP   LOCATION:  1432                         FACILITY:  [facility]   PHYSICIAN:  [name], MDDATE OF BIRTH:  [birth year]   DATE OF CONSULTATION:  11/10/2005  DATE OF DISCHARGE:                                   CONSULTATION   REFERRED BY:  Eric [name], M.D.   PRIMARY CARE PHYSICIAN:  Eric [name], M.D.   CHIEF COMPLAINT:  Chest pain.   HISTORY OF PRESENT ILLNESS:  [name] is a 55 year old Caucasian male with  a history of hypertension, hyperlipidemia and prior tobacco abuse. He  presented to the emergency room yesterday due to intermittent exertional  chest pain for the past week. Yesterday, he had a very severe episode which  was rated 7-8/10. It stayed in his left chest and he described it as a  crampy and squeezing pain. It lasted a few minutes, he did have some  shortness of breath and some nausea. He did not have palpitations. He did  not have syncope, no diaphoresis. He has had chest pain before and was  evaluated a few years ago. He did not have any evidence of coronary artery  disease during his previous evaluation. His chest pain was somewhat relieved  with nitroglycerin and aspirin that was given in the emergency room. Since  that time, he has not had any severe chest pain per his report. Overall, he  feels fairly well.   PAST MEDICAL HISTORY:  1.  Hypertension.  2.  Dyslipidemia.  3.  Insomnia.  4.  GERD.  5.  History of panic attacks.   MEDIATIONS AT HOME:  1.  Micardis, unknown dosage.  2.  Aspirin 81 mg daily.  3.  Allegra p.r.n.  4.  Ambien p.r.n.   ALLERGIES:  DALMANE, ZETIA, and intolerant to STATIN secondary to myalgias.   PAST SURGICAL HISTORY:  Hernia repair, left shoulder surgery, plastic  surgery on his ear and ganglion cyst removal.   FAMILY HISTORY:  Father died of an MI at age 20. He has a brother who has  had  coronary artery disease.   SOCIAL HISTORY:  He is divorced without children and he lives alone. He does  not smoke. He quit 2 years ago. He does drink alcohol and he does not use  illicit drugs. He used marijuana in the past, he has not used in the past 5  years. He does not exercise.   REVIEW OF SYSTEMS:  Significant for chest pain, shortness of breath, and  fatigue as well as nausea and dizziness. He denies diaphoresis, syncope. He  has not had any fevers or chills, no weight loss, no focal weakness. All  other systems negative.   PHYSICAL EXAMINATION:  VITAL SIGNS:  Blood pressure 100/58, pulse of 55,  respiratory rate of 16.  GENERAL:  He is awake, alert, in no apparent distress.  HEENT:  Normocephalic, atraumatic. Eyes, extraocular movements intact.  Mouth, oropharynx is clear.  NECK:  Supple. No JVD, no carotid bruits.  CARDIOVASCULAR:  Regular rate and rhythm, S1, S2. No murmurs, rubs or  gallops.  LUNGS:  Clear to auscultation bilaterally.  ABDOMEN:  Soft, nontender, nondistended.  EXTREMITIES:  Showed no edema, 2+ dorsalis pedis pulses bilaterally.  SKIN:  No rash.  BACK:  No kyphoscoliosis.  NEUROLOGIC:  No focal deficit.  PSYCHE:  Normal mood and affect.   LABORATORY DATA:  Troponin's negative x3 on point of care test. He has 2  other normal troponin's. LDL of 134, hematocrit of 09.8, creatinine of 0.9.  ECG showed normal sinus rhythm, no ST-T wave changes. Chest x-ray showed no  active disease, no arrhythmias noted on telemetry.   MEDICAL DECISION MAKING:  A 55 year old with chest pain and hypertension.   PLAN:  1.  I had a long discussion with the patient regarding various options. We      discussed possible catheterization on Monday after an inpatient stay      over the weekend. He prefers to go home and have an outpatient stress      test. We have scheduled the stress test on Monday morning, January 29.      The patient does know that if has more chest pain he  will need to return      to the emergency room. Since he is aware that he has to come back if he      has more symptoms, I think the outpatient stress test is reasonable.  2.  He will continue aspirin 81 mg daily.  3.  He will be given sublingual nitroglycerin as well.  4.  We also discussed diet and other lifestyle modifications to help lower      his cholesterol.  5.  I encouraged him to continue to abstain from smoking.  6.  I will see the patient on Monday at the time of his stress test.      [name], MD  Electronically Signed     JSV/MEDQ  D:  11/10/2005  T:  11/11/2005  Job:  [id number]

## 2011-03-03 NOTE — Discharge Summary (Signed)
[name], [name]                ACCOUNT NO.:  [id number]   MEDICAL RECORD NO.:  [id number]          PATIENT TYPE:  INP   LOCATION:  1432                         FACILITY:  [facility]   PHYSICIAN:  Isidor Holts, M.D.  DATE OF BIRTH:  03-23-1956   DATE OF ADMISSION:  11/09/2005  DATE OF DISCHARGE:  11/10/2005                                 DISCHARGE SUMMARY   PRIMARY MEDICAL DOCTOR:  Dr. Benedetto Goad   DISCHARGE DIAGNOSES:  1.  Recurrent chest pain, rule out coronary artery disease.  2.  Hypertension.  3.  Dyslipidemia.  4.  Gastroesophageal reflux disease.   DISCHARGE MEDICATIONS:  1.  Aspirin 325 mg p.o. daily.  2.  Nitroglycerin 0.4 mg sublingual p.r.n. every 5 minutes for chest pain.  3.  Continue Micardis at pre-admission dosage.   PROCEDURES:  1.  Chest x-ray dated November 10, 2005.  Heart size normal, no effusions or      edema.  No focal opacities identified.  No active cardiopulmonary      disease.  2.  EKG dated November 09, 2005.  This showed sinus rhythm, regular, normal      axis, rate 73 per minute, no acute ischemic changes.   CONSULTATIONS:  Dr. Eldridge Dace, [facility].   ADMISSION HISTORY:  As in the H&P of November 09, 2005, dictated by Dr. Lonia Blood. However, in brief, this is a 55 year old male, with known history of  hypertension, hyperlipidemia, GERD, and positive family history of early  coronary artery disease, who presents with recurrent episodes of left-sided  chest pain, precipitated by exertion, denied shortness of breath or  diaphoresis.  The patient was admitted for further evaluation,  investigation, and management.   CLINICAL COURSE:  1.  Recurrent chest pain.  The patient does have a variety of risk factors      for coronary artery disease, including gender, age, prior smoking      history, dyslipidemia, hypertension, and positive family history.  It      was felt that patient needed appropriate work-up to rule out coronary      artery  disease.  He was placed on telemetric monitoring which was quite      uneventful, remained in sinus rhythm, had only one small twinge of chest      discomfort in the a.m. of November 10, 2005, otherwise remained      asymptomatic.  Chest x-ray was quite unremarkable.  Cardiac enzymes      remained unelevated.  Dr. Eldridge Dace from [facility] was called on      consultation, he saw the patient and tried to persuade him to stay until      11/13/05, to have a stress test, but the patient was insistent on being      discharged.  He has therefore been discharged on aspirin and sublingual      p.r.n. nitrate, and arrangements have been put in place for patient to      have an outpatient stress test on 11/13/05.  After discussion with Dr.      Eldridge Dace, recommendation is  that the patient be taken off beta blockers      for now, so as not to interfere with stress test findings.  The patient      is agreeable with this plan.   1.  Hypertension.  The patient remained normotensive throughout the course      of his hospital stay.   1.  Dyslipidemia.  Lipid profile shows the following findings:  Total      cholesterol 194, triglycerides 90, HDL 42, LDL 134.  This appears      reasonable.  I understand that in the past, the patient has been unable      to tolerate STATINS, due to myalgia, however, should he prove to have      significant coronary artery disease, the target LDL should be less than      70.  The patient may benefit from Zetia and [place], which I will leave      to the patient's primary medical doctor.   1.  Gastroesophageal reflux disease.  This did not prove troublesome during      the course of the patient's hospital stay.   DISPOSITION:  The patient was discharged in satisfactory condition on  November 10, 2005.   DIET:  Healthy heart diet.   ACTIVITY:  As tolerated.   WOUND CARE:  Not applicable.   PAIN MANAGEMENT:  Not applicable.   FOLLOW UP INSTRUCTIONS:  The patient is  to return to Dr. Hoyle Barr office,  [facility], on November 13, 2005.  He is to have a stress test at that  time.  Further management will depend on stress test results.  This has been  communicated to the patient.  He has been supplied with the appropriate  contact information.  He has verbalized understanding.  He is otherwise, to  follow up routinely with his primary medical doctor, Dr. Benedetto Goad, per  prior scheduled appointments.      Isidor Holts, M.D.  Electronically Signed     CO/MEDQ  D:  11/10/2005  T:  11/10/2005  Job:  [id number]   cc:   [name]. Andrey Campanile, M.D.  Fax: [phone]   [name], MD  Fax: [phone]

## 2011-03-07 ENCOUNTER — Other Ambulatory Visit (INDEPENDENT_AMBULATORY_CARE_PROVIDER_SITE_OTHER): Payer: Medicare Other

## 2011-03-07 ENCOUNTER — Encounter: Payer: Self-pay | Admitting: Internal Medicine

## 2011-03-07 ENCOUNTER — Ambulatory Visit (INDEPENDENT_AMBULATORY_CARE_PROVIDER_SITE_OTHER): Payer: Medicare Other | Admitting: Internal Medicine

## 2011-03-07 ENCOUNTER — Ambulatory Visit (INDEPENDENT_AMBULATORY_CARE_PROVIDER_SITE_OTHER)
Admission: RE | Admit: 2011-03-07 | Discharge: 2011-03-07 | Disposition: A | Discharge status: Home / Self Care | Payer: Medicare Other | Source: Ambulatory Visit | Attending: Internal Medicine | Admitting: Internal Medicine

## 2011-03-07 ENCOUNTER — Other Ambulatory Visit (INDEPENDENT_AMBULATORY_CARE_PROVIDER_SITE_OTHER): Payer: Medicare Other | Admitting: Internal Medicine

## 2011-03-07 VITALS — BP 112/68 | HR 82 | Temp 98.3°F | Ht 70.0 in | Wt 214.0 lb

## 2011-03-07 DIAGNOSIS — J8410 Pulmonary fibrosis, unspecified: Secondary | ICD-10-CM

## 2011-03-07 DIAGNOSIS — I10 Essential (primary) hypertension: Secondary | ICD-10-CM

## 2011-03-07 DIAGNOSIS — Z79899 Other long term (current) drug therapy: Secondary | ICD-10-CM

## 2011-03-07 DIAGNOSIS — Z0000 Encounter for general adult medical examination without abnormal findings: Secondary | ICD-10-CM

## 2011-03-07 DIAGNOSIS — Z13220 Encounter for screening for lipoid disorders: Secondary | ICD-10-CM

## 2011-03-07 DIAGNOSIS — E119 Type 2 diabetes mellitus without complications: Secondary | ICD-10-CM

## 2011-03-07 DIAGNOSIS — R0902 Hypoxemia: Secondary | ICD-10-CM

## 2011-03-07 DIAGNOSIS — Z125 Encounter for screening for malignant neoplasm of prostate: Secondary | ICD-10-CM

## 2011-03-07 LAB — COMPREHENSIVE METABOLIC PANEL
Albumin: 4 g/dL (ref 3.5–5.2)
Calcium: 9.6 mg/dL (ref 8.4–10.5)
Chloride: 109 mEq/L (ref 96–112)
GFR: 71.04 mL/min (ref >60.00)
Total Protein: 6.3 g/dL (ref 6.0–8.3)

## 2011-03-07 LAB — LDL CHOLESTEROL, DIRECT: Direct LDL: 219.3 mg/dL

## 2011-03-07 LAB — LIPID PANEL: Cholesterol: 257 mg/dL — ABNORMAL HIGH (ref 0–200)

## 2011-03-07 NOTE — Progress Notes (Signed)
Subjective:    Patient ID: [name], male    DOB: 1956-06-10, 55 y.o.   MRN: [id number]  HPI He returns for a complete physical and he tells me that he needs to have a chest xray done so Dr. Craige Cotta can see it during his next visit with Dr. Craige Cotta. He fells well and says that his SOB and DOE are at their baseline levels.   Review of Systems  Constitutional: Negative for fever, chills, diaphoresis, activity change, appetite change, fatigue and unexpected weight change.  HENT: Negative for sore throat, facial swelling, trouble swallowing, neck pain, neck stiffness and voice change.   Eyes: Negative for photophobia and visual disturbance.  Respiratory: Negative for apnea, cough, choking, chest tightness, shortness of breath, wheezing and stridor.   Cardiovascular: Negative for chest pain, palpitations and leg swelling.  Gastrointestinal: Negative for nausea, vomiting, abdominal pain, diarrhea, constipation, blood in stool and abdominal distention.  Genitourinary: Negative for dysuria, urgency, frequency, hematuria, decreased urine volume, enuresis and difficulty urinating.  Musculoskeletal: Negative for myalgias, back pain, joint swelling, arthralgias and gait problem.  Skin: Negative for color change, pallor and rash.  Neurological: Negative for dizziness, tremors, seizures, syncope, facial asymmetry, speech difficulty, weakness, light-headedness, numbness and headaches.  Hematological: Negative for adenopathy. Does not bruise/bleed easily.  Psychiatric/Behavioral: Negative for suicidal ideas, hallucinations, behavioral problems, confusion, sleep disturbance, self-injury, dysphoric mood, decreased concentration and agitation. The patient is not nervous/anxious and is not hyperactive.        Objective:   Physical Exam  Vitals reviewed. Constitutional: He is oriented to person, place, and time. He appears well-developed and well-nourished. No distress.  HENT:  Head: Normocephalic and  atraumatic.  Right Ear: External ear normal.  Left Ear: External ear normal.  Nose: Nose normal.  Mouth/Throat: Oropharynx is clear and moist. No oropharyngeal exudate.  Eyes: Conjunctivae and EOM are normal. Pupils are equal, round, and reactive to light. Right eye exhibits no discharge. Left eye exhibits no discharge. No scleral icterus.  Neck: Normal range of motion. Neck supple. No JVD present. No tracheal deviation present. No thyromegaly present.  Cardiovascular: Normal rate, regular rhythm, normal heart sounds and intact distal pulses.  Exam reveals no gallop and no friction rub.   No murmur heard. Pulmonary/Chest: Effort normal and breath sounds normal. No stridor. No respiratory distress. He has no wheezes. He has no rales. He exhibits no tenderness.  Abdominal: Soft. Bowel sounds are normal. He exhibits no distension and no mass. There is no tenderness. There is no rebound and no guarding. Hernia confirmed negative in the right inguinal area and confirmed negative in the left inguinal area.  Genitourinary: Rectum normal, prostate normal, testes normal and penis normal. Rectal exam shows no external hemorrhoid, no internal hemorrhoid, no fissure, no mass, no tenderness and anal tone normal. Guaiac negative stool. Prostate is not enlarged and not tender. Cremasteric reflex is present. Right testis shows no mass, no swelling and no tenderness. Left testis shows no mass, no swelling and no tenderness. Circumcised. No phimosis, hypospadias, penile erythema or penile tenderness. No discharge found.  Musculoskeletal: Normal range of motion. He exhibits no edema and no tenderness.  Lymphadenopathy:    He has no cervical adenopathy.       Right: No inguinal adenopathy present.       Left: No inguinal adenopathy present.  Neurological: He is alert and oriented to person, place, and time. He has normal reflexes. He displays normal reflexes. No cranial nerve deficit. He exhibits  normal muscle tone.  Coordination normal.  Skin: Skin is warm and dry. No rash noted. He is not diaphoretic. No erythema. No pallor.  Psychiatric: He has a normal mood and affect. His behavior is normal. Judgment and thought content normal.        Lab Results  Component Value Date   WBC 8.7 11/23/2010   HGB 16.0 11/23/2010   HCT 45.7 11/23/2010   PLT 252.0 11/23/2010   ALT 26 11/23/2010   AST 20 11/23/2010   NA 140 11/23/2010   K 4.1 11/23/2010   CL 105 11/23/2010   CREATININE 1.0 11/23/2010   BUN 15 11/23/2010   CO2 26 11/23/2010   HGBA1C 5.8 11/23/2010    Assessment & Plan:

## 2011-03-07 NOTE — Patient Instructions (Signed)
Health Maintenance in Males MAINTAIN REGULAR HEALTH EXAMS  Maintain a healthy diet and normal weight. Increased weight leads to problems with blood pressure and diabetes. Decrease fat in the diet and increase exercise. Obtain a proper diet from your caregiver if necessary.   High blood pressure causes heart and blood vessel problems. Check blood pressures regularly and keep your blood pressure at normal limits. Aerobic exercise helps this. Persistent elevations of blood pressure should be treated with medications if weight loss and exercise are ineffective.   Avoid smoking, drinking in excess (more than 2 drinks per day), or use of street drugs. Do not share needles with anyone. Ask for help if you need assistance or instructions on stopping the use of alcohol, cigarettes, or drugs.   Maintain normal blood lipids and cholesterol. Your caregiver can give you information to lower your risk of heart disease or stroke.   Ask your caregiver if you are in need of early heart disease screening because of a strong family history of heart disease or signs of elevated testosterone (male sex hormone) levels. These can predispose you to early heart disease.   Practice safe sex. Practicing safe sex decreases your risk for a sexually transmitted infection (STI). Some of the STIs are gonorrhea, chlamydia, syphilis, trichimonas, herpes, human papillomavirus (HPV), and human immunodeficiency virus (HIV). Herpes, HIV, and HPV are viral illnesses that have no cure. These can result in disability, cancer, and death.   It is not safe for someone who has AIDS or is HIV positive to have unprotected sex with a partner who is HIV positive. The reason for this is the fact that there are many different strains of HIV. If you have a strain that is readily treated with medications and then suddenly introduce a strain from a partner that has no further treatment options, you may suddenly have a strain of HIV that is untreatable.  Even if you are both positive for HIV, it is still necessary to practice safe sex.   Use sunscreen with a SPF of 15 or greater. Being outside in the sun when your shadow caused by the sun is shorter than you are, means you are being exposed to sun at greater intensity. Lighter skinned people are at a greater risk of skin cancer.   Keep carbon monoxide and smoke detectors in your home and functioning at all times. Change the batteries every 6 months.   Do monthly examinations of your testicles. The best time to do this is after a hot shower or bath when the tissues are loose. Notify your caregivers of any lumps, tenderness, or changes in size or shape.   Notify your caregiver of new moles or changes in moles, especially if there is a change in shape or color. Also notify your caregiver if a mole is larger than the size of a pencil eraser.   Stay current with your tetanus shots and other required immunizations.  The Body Mass Index (BMI) is a way of measuring how much of your body is fat. Having a BMI above 27 increases the risk of heart disease, diabetes, hypertension, stroke, and other problems related to obesity. Document Released: 03/30/2008 Document Re-Released: 03/22/2010 [employer] Patient Information 2011 [employer]. 

## 2011-03-13 ENCOUNTER — Encounter: Payer: Self-pay | Admitting: Internal Medicine

## 2011-03-13 DIAGNOSIS — Z0000 Encounter for general adult medical examination without abnormal findings: Secondary | ICD-10-CM | POA: Insufficient documentation

## 2011-03-13 NOTE — Assessment & Plan Note (Signed)
His blood sugars have been well controlled, I will recheck his labs today

## 2011-03-13 NOTE — Assessment & Plan Note (Signed)
BP looks good today, I will monitor his lytes and renal function today

## 2011-03-13 NOTE — Assessment & Plan Note (Signed)
Routine labs done, will follow-up on them and advise further

## 2011-03-13 NOTE — Assessment & Plan Note (Signed)
O2 level looks good today, cxr ordered at his request

## 2011-07-05 ENCOUNTER — Encounter: Payer: Self-pay | Admitting: Internal Medicine

## 2011-07-05 ENCOUNTER — Other Ambulatory Visit (INDEPENDENT_AMBULATORY_CARE_PROVIDER_SITE_OTHER): Payer: Medicare Other

## 2011-07-05 ENCOUNTER — Ambulatory Visit (INDEPENDENT_AMBULATORY_CARE_PROVIDER_SITE_OTHER): Payer: Medicare Other | Admitting: Internal Medicine

## 2011-07-05 ENCOUNTER — Other Ambulatory Visit: Payer: Self-pay | Admitting: Internal Medicine

## 2011-07-05 VITALS — BP 132/80 | HR 79 | Temp 98.1°F | Resp 16 | Wt 214.2 lb

## 2011-07-05 DIAGNOSIS — E782 Mixed hyperlipidemia: Secondary | ICD-10-CM

## 2011-07-05 DIAGNOSIS — I10 Essential (primary) hypertension: Secondary | ICD-10-CM

## 2011-07-05 DIAGNOSIS — E119 Type 2 diabetes mellitus without complications: Secondary | ICD-10-CM

## 2011-07-05 DIAGNOSIS — Z23 Encounter for immunization: Secondary | ICD-10-CM

## 2011-07-05 DIAGNOSIS — J438 Other emphysema: Secondary | ICD-10-CM

## 2011-07-05 DIAGNOSIS — E785 Hyperlipidemia, unspecified: Secondary | ICD-10-CM | POA: Insufficient documentation

## 2011-07-05 DIAGNOSIS — E781 Pure hyperglyceridemia: Secondary | ICD-10-CM | POA: Insufficient documentation

## 2011-07-05 LAB — CBC WITH DIFFERENTIAL/PLATELET
Basophils Absolute: 0.1 10*3/uL (ref 0.0–0.1)
HCT: 43.6 % (ref 39.0–52.0)
Lymphocytes Relative: 23.1 % (ref 12.0–46.0)
Lymphs Abs: 2 10*3/uL (ref 0.7–4.0)
Monocytes Relative: 10.9 % (ref 3.0–12.0)
Platelets: 234 10*3/uL (ref 150.0–400.0)
RDW: 13.7 % (ref 11.5–14.6)

## 2011-07-05 LAB — LIPID PANEL
Cholesterol: 266 mg/dL — ABNORMAL HIGH (ref 0–200)
VLDL: 27.6 mg/dL (ref 0.0–40.0)

## 2011-07-05 LAB — COMPREHENSIVE METABOLIC PANEL
Albumin: 4 g/dL (ref 3.5–5.2)
Alkaline Phosphatase: 76 U/L (ref 39–117)
BUN: 13 mg/dL (ref 6–23)
CO2: 27 mEq/L (ref 19–32)
Calcium: 9.5 mg/dL (ref 8.4–10.5)
GFR: 77.23 mL/min (ref >60.00)
Glucose, Bld: 74 mg/dL (ref 70–99)
Potassium: 3.7 mEq/L (ref 3.5–5.1)

## 2011-07-05 LAB — TSH: TSH: 1.29 u[IU]/mL (ref 0.35–5.50)

## 2011-07-05 MED ORDER — COLESEVELAM HCL 3.75 G PO PACK: 1.0000 | PACK | Freq: Every day | ORAL | Status: DC

## 2011-07-05 NOTE — Progress Notes (Signed)
Subjective:    Patient ID: EL PILE, male    DOB: [birth year], 55 y.o.   MRN: [id number]  Hypertension This is a chronic problem. The current episode started more than 1 year ago. The problem has been gradually improving since onset. The problem is controlled. Associated symptoms include shortness of breath (chronic and unchanged). Pertinent negatives include no anxiety, blurred vision, chest pain, headaches, malaise/fatigue, neck pain, orthopnea, palpitations, peripheral edema, PND or sweats. There are no associated agents to hypertension. Past treatments include diuretics. The current treatment provides moderate improvement. There are no compliance problems.       Review of Systems  Constitutional: Negative.  Negative for malaise/fatigue.  HENT: Negative for sore throat, facial swelling, trouble swallowing, neck pain and voice change.   Eyes: Negative.  Negative for blurred vision.  Respiratory: Positive for shortness of breath (chronic and unchanged). Negative for apnea, cough, choking, chest tightness, wheezing and stridor.   Cardiovascular: Negative for chest pain, palpitations, orthopnea, leg swelling and PND.  Gastrointestinal: Negative for nausea, vomiting, abdominal pain, constipation, abdominal distention and anal bleeding.  Genitourinary: Negative for dysuria, urgency, frequency, hematuria, flank pain, decreased urine volume, enuresis and difficulty urinating.  Musculoskeletal: Negative for myalgias, back pain, joint swelling, arthralgias and gait problem.  Skin: Negative for color change, pallor, rash and wound.  Neurological: Negative for dizziness, tremors, syncope, facial asymmetry, speech difficulty, weakness, light-headedness, numbness and headaches.  Hematological: Negative for adenopathy. Does not bruise/bleed easily.  Psychiatric/Behavioral: Negative.        Objective:   Physical Exam  Vitals reviewed. Constitutional: He is oriented to person, place, and time. He  appears well-developed and well-nourished. No distress.  HENT:  Mouth/Throat: Oropharynx is clear and moist. No oropharyngeal exudate.  Eyes: Conjunctivae are normal. Right eye exhibits no discharge. Left eye exhibits no discharge. No scleral icterus.  Neck: Normal range of motion. Neck supple. No JVD present. No tracheal deviation present. No thyromegaly present.  Cardiovascular: Normal rate, regular rhythm, normal heart sounds and intact distal pulses.  Exam reveals no gallop and no friction rub.   No murmur heard. Pulmonary/Chest: Effort normal and breath sounds normal. No stridor. No respiratory distress. He has no wheezes. He has no rales. He exhibits no tenderness.  Abdominal: Soft. Bowel sounds are normal. He exhibits no distension and no mass. There is no tenderness. There is no rebound and no guarding.  Musculoskeletal: Normal range of motion. He exhibits no edema and no tenderness.  Lymphadenopathy:    He has no cervical adenopathy.  Neurological: He is oriented to person, place, and time. He displays normal reflexes. He exhibits normal muscle tone.  Skin: Skin is warm and dry. No rash noted. He is not diaphoretic. No erythema. No pallor.  Psychiatric: He has a normal mood and affect. His behavior is normal. Judgment and thought content normal.      Lab Results  Component Value Date   WBC 8.7 11/23/2010   HGB 16.0 11/23/2010   HCT 45.7 11/23/2010   PLT 252.0 11/23/2010   CHOL 257* 03/07/2011   TRIG 158.0* 03/07/2011   HDL 40.30 03/07/2011   LDLDIRECT 219.3 03/07/2011   ALT 31 03/07/2011   AST 24 03/07/2011   NA 140 03/07/2011   K 4.2 03/07/2011   CL 109 03/07/2011   CREATININE 1.1 03/07/2011   BUN 21 03/07/2011   CO2 25 03/07/2011   PSA 0.50 03/07/2011   HGBA1C 5.6 03/07/2011      Assessment & Plan:

## 2011-07-05 NOTE — Patient Instructions (Signed)
Diabetes, Type 2 Diabetes is a lasting (chronic) disease. In type 2 diabetes, the pancreas does not make enough insulin (a hormone), and the body does not respond normally to the insulin that is made. This type of diabetes was also previously called adult onset diabetes. About 90% of all those who have diabetes have type 2. It usually occurs after the age of 40 but can occur at any age. CAUSES Unlike type 1 diabetes, which happens because insulin is no longer being made, type 2 diabetes happens because the body is making less insulin and has trouble using the insulin properly. SYMPTOMS  Drinking more than usual.   Urinating more than usual.   Blurred vision.   Dry, itchy skin.   Frequent infection like yeast infections in women.   More tired than usual (fatigue).  TREATMENT  Healthy eating.   Exercise.   Medication, if needed.   Monitoring blood glucose (sugar).   Seeing your caregiver regularly.  HOME CARE INSTRUCTIONS  Check your blood glucose (sugar) at least once daily. More frequent monitoring may be necessary, depending on your medications and on how well your diabetes is controlled. Your caregiver will advise you.   Take your medicine as directed by your caregiver.   Do not smoke.   Make wise food choices. Ask your caregiver for information. Weight loss can improve your diabetes.   Learn about low blood glucose (hypoglycemia) and how to treat it.   Get your eyes checked regularly.   Have a yearly physical exam. Have your blood pressure checked. Get your blood and urine tested.   Wear a pendant or bracelet saying that you have diabetes.   Check your feet every night for sores. Let your caregiver know if you have sores that are not healing.  SEEK MEDICAL CARE IF:  You are having problems keeping your blood glucose at target range.   You feel you might be having problems with your medicines.   You have symptoms of an illness that is not improving after 24  hours.   You have a sore or wound that is not healing.   You notice a change in vision or a new problem with your vision.   You develop a fever of more than 100.5.  Document Released: 10/02/2005 Document Re-Released: 10/24/2009 [employer] Patient Information 2011 [employer]. 

## 2011-07-06 ENCOUNTER — Encounter: Payer: Self-pay | Admitting: Internal Medicine

## 2011-07-06 LAB — LDL CHOLESTEROL, DIRECT: Direct LDL: 191.1 mg/dL

## 2011-07-06 NOTE — Assessment & Plan Note (Signed)
His BP is well controlled 

## 2011-07-06 NOTE — Assessment & Plan Note (Signed)
I will check his A1C  

## 2011-07-06 NOTE — Assessment & Plan Note (Signed)
He refuses to take a statin, he's afraid of side effects and will not change his mind

## 2011-07-13 ENCOUNTER — Ambulatory Visit: Payer: Medicare Other | Admitting: Pulmonary Disease

## 2011-07-17 LAB — BLOOD GAS, ARTERIAL
Acid-Base Excess: 0.6
Acid-base deficit: 0.7
Acid-base deficit: 2.5 — ABNORMAL HIGH
Acid-base deficit: 6.5 — ABNORMAL HIGH
Bicarbonate: 18.1 — ABNORMAL LOW
Bicarbonate: 18.3 — ABNORMAL LOW
Bicarbonate: 22.8
Bicarbonate: 24
Bicarbonate: 24.7 — ABNORMAL HIGH
Bicarbonate: 24.9 — ABNORMAL HIGH
Drawn by: 310571
FIO2: 0.4
FIO2: 0.4
FIO2: 0.4
FIO2: 0.5
FIO2: 0.6
FIO2: 0.6
MECHVT: 0.49
MECHVT: 0.49
MECHVT: 490
MECHVT: 560
O2 Content: 15
O2 Saturation: 91.7
O2 Saturation: 92.3
O2 Saturation: 94.1
O2 Saturation: 94.2
O2 Saturation: 98.5
O2 Saturation: 98.6
O2 Saturation: 99.2
PEEP: 10
PEEP: 5
PEEP: 8
Patient temperature: 98.6
Patient temperature: 98.6
Patient temperature: 98.6
Patient temperature: 98.6
Patient temperature: 98.6
Patient temperature: 98.6
Patient temperature: 98.8
RATE: 32
RATE: 32
RATE: 32
RATE: 35
TCO2: 18.9
TCO2: 19.2
TCO2: 23
TCO2: 25.7
pCO2 arterial: 30.6 — ABNORMAL LOW
pCO2 arterial: 32.5 — ABNORMAL LOW
pCO2 arterial: 33.4 — ABNORMAL LOW
pCO2 arterial: 33.8 — ABNORMAL LOW
pH, Arterial: 7.381
pH, Arterial: 7.469 — ABNORMAL HIGH
pH, Arterial: 7.472 — ABNORMAL HIGH
pH, Arterial: 7.48 — ABNORMAL HIGH
pH, Arterial: 7.493 — ABNORMAL HIGH
pO2, Arterial: 57 — ABNORMAL LOW
pO2, Arterial: 58.7 — ABNORMAL LOW
pO2, Arterial: 59.7 — ABNORMAL LOW
pO2, Arterial: 59.7 — ABNORMAL LOW
pO2, Arterial: 66 — ABNORMAL LOW
pO2, Arterial: 89.4

## 2011-07-17 LAB — GLUCOSE, CAPILLARY
Glucose-Capillary: 100 — ABNORMAL HIGH
Glucose-Capillary: 100 — ABNORMAL HIGH
Glucose-Capillary: 101 — ABNORMAL HIGH
Glucose-Capillary: 102 — ABNORMAL HIGH
Glucose-Capillary: 103 — ABNORMAL HIGH
Glucose-Capillary: 103 — ABNORMAL HIGH
Glucose-Capillary: 104 — ABNORMAL HIGH
Glucose-Capillary: 106 — ABNORMAL HIGH
Glucose-Capillary: 107 — ABNORMAL HIGH
Glucose-Capillary: 107 — ABNORMAL HIGH
Glucose-Capillary: 108 — ABNORMAL HIGH
Glucose-Capillary: 110 — ABNORMAL HIGH
Glucose-Capillary: 113 — ABNORMAL HIGH
Glucose-Capillary: 115 — ABNORMAL HIGH
Glucose-Capillary: 118 — ABNORMAL HIGH
Glucose-Capillary: 119 — ABNORMAL HIGH
Glucose-Capillary: 119 — ABNORMAL HIGH
Glucose-Capillary: 121 — ABNORMAL HIGH
Glucose-Capillary: 123 — ABNORMAL HIGH
Glucose-Capillary: 125 — ABNORMAL HIGH
Glucose-Capillary: 126 — ABNORMAL HIGH
Glucose-Capillary: 146 — ABNORMAL HIGH
Glucose-Capillary: 155 — ABNORMAL HIGH
Glucose-Capillary: 165 — ABNORMAL HIGH
Glucose-Capillary: 204 — ABNORMAL HIGH
Glucose-Capillary: 85
Glucose-Capillary: 87
Glucose-Capillary: 89
Glucose-Capillary: 90
Glucose-Capillary: 92
Glucose-Capillary: 94
Glucose-Capillary: 97
Glucose-Capillary: 97
Glucose-Capillary: 99

## 2011-07-17 LAB — CBC
HCT: 27.1 — ABNORMAL LOW
HCT: 27.9 — ABNORMAL LOW
HCT: 28.5 — ABNORMAL LOW
HCT: 28.9 — ABNORMAL LOW
HCT: 31.7 — ABNORMAL LOW
Hemoglobin: 10.3 — ABNORMAL LOW
Hemoglobin: 10.4 — ABNORMAL LOW
Hemoglobin: 11.2 — ABNORMAL LOW
Hemoglobin: 14.7
Hemoglobin: 9.4 — ABNORMAL LOW
Hemoglobin: 9.6 — ABNORMAL LOW
Hemoglobin: 9.8 — ABNORMAL LOW
Hemoglobin: 9.8 — ABNORMAL LOW
MCHC: 33.8
MCHC: 34.1
MCHC: 34.2
MCHC: 34.7
MCHC: 34.8
MCHC: 35.1
MCV: 88.8
MCV: 89.9
MCV: 90
MCV: 90.3
MCV: 90.8
Platelets: 229
Platelets: 282
Platelets: 335
Platelets: 428 — ABNORMAL HIGH
Platelets: 444 — ABNORMAL HIGH
Platelets: 517 — ABNORMAL HIGH
RBC: 3.1 — ABNORMAL LOW
RBC: 3.12 — ABNORMAL LOW
RBC: 3.17 — ABNORMAL LOW
RBC: 3.22 — ABNORMAL LOW
RBC: 3.34 — ABNORMAL LOW
RBC: 3.57 — ABNORMAL LOW
RBC: 4.03 — ABNORMAL LOW
RBC: 4.79
RDW: 13
RDW: 13.1
RDW: 13.1
RDW: 13.2
RDW: 13.3
RDW: 13.5
RDW: 13.5
WBC: 11 — ABNORMAL HIGH
WBC: 12.8 — ABNORMAL HIGH
WBC: 5.5
WBC: 6.7
WBC: 6.8
WBC: 7
WBC: 8
WBC: 8.3

## 2011-07-17 LAB — ALPHA-1-ANTITRYPSIN: A-1 Antitrypsin, Ser: 263 — ABNORMAL HIGH

## 2011-07-17 LAB — COMPREHENSIVE METABOLIC PANEL
ALT: 112 — ABNORMAL HIGH
ALT: 49
ALT: 65 — ABNORMAL HIGH
AST: 30
AST: 53 — ABNORMAL HIGH
Albumin: 2 — ABNORMAL LOW
Albumin: 2.1 — ABNORMAL LOW
CO2: 19
CO2: 22
Calcium: 8.3 — ABNORMAL LOW
Chloride: 109
Chloride: 116 — ABNORMAL HIGH
Creatinine, Ser: 0.88
GFR calc Af Amer: >60
GFR calc Af Amer: >60
GFR calc non Af Amer: >60
GFR calc non Af Amer: >60
Glucose, Bld: 128 — ABNORMAL HIGH
Potassium: 3.1 — ABNORMAL LOW
Potassium: 3.5
Potassium: 5.2 — ABNORMAL HIGH
Sodium: 141
Sodium: 141
Sodium: 142
Total Bilirubin: 0.7
Total Bilirubin: 0.7
Total Protein: 5.8 — ABNORMAL LOW

## 2011-07-17 LAB — BASIC METABOLIC PANEL
BUN: 10
BUN: 12
BUN: 6
BUN: 6
BUN: 8
CO2: 24
CO2: 24
CO2: 25
Calcium: 7.7 — ABNORMAL LOW
Calcium: 8.1 — ABNORMAL LOW
Calcium: 8.6
Calcium: 8.7
Calcium: 8.9
Calcium: 9.2
Chloride: 109
Chloride: 109
Chloride: 110
Chloride: 111
Chloride: 112
Creatinine, Ser: 0.62
Creatinine, Ser: 0.68
Creatinine, Ser: 0.68
Creatinine, Ser: 0.84
GFR calc Af Amer: >60
GFR calc Af Amer: >60
GFR calc Af Amer: >60
GFR calc non Af Amer: >60
GFR calc non Af Amer: >60
GFR calc non Af Amer: >60
GFR calc non Af Amer: >60
GFR calc non Af Amer: >60
GFR calc non Af Amer: >60
Glucose, Bld: 103 — ABNORMAL HIGH
Glucose, Bld: 109 — ABNORMAL HIGH
Glucose, Bld: 126 — ABNORMAL HIGH
Glucose, Bld: 153 — ABNORMAL HIGH
Glucose, Bld: 160 — ABNORMAL HIGH
Glucose, Bld: 97
Potassium: 3.1 — ABNORMAL LOW
Potassium: 3.6
Potassium: 3.6
Potassium: 3.9
Potassium: 4.5
Sodium: 139
Sodium: 139
Sodium: 141
Sodium: 142
Sodium: 143

## 2011-07-17 LAB — APTT: aPTT: 25

## 2011-07-17 LAB — CULTURE, BLOOD (SINGLE): Culture: NO GROWTH

## 2011-07-17 LAB — URINALYSIS, MICROSCOPIC ONLY
Bilirubin Urine: NEGATIVE
Ketones, ur: NEGATIVE
Nitrite: NEGATIVE
Urobilinogen, UA: 0.2
pH: 5

## 2011-07-17 LAB — FOLATE: Folate: 10.2

## 2011-07-17 LAB — POCT I-STAT, CHEM 8
BUN: 44 — ABNORMAL HIGH
Chloride: 100
Glucose, Bld: 123 — ABNORMAL HIGH
HCT: 42
Potassium: 3.7

## 2011-07-17 LAB — IRON AND TIBC
Iron: 30 — ABNORMAL LOW
Saturation Ratios: 17 — ABNORMAL LOW
TIBC: 174 — ABNORMAL LOW

## 2011-07-17 LAB — RAPID URINE DRUG SCREEN, HOSP PERFORMED
Cocaine: NOT DETECTED
Tetrahydrocannabinol: NOT DETECTED

## 2011-07-17 LAB — CULTURE, BLOOD (ROUTINE X 2)

## 2011-07-17 LAB — HEPATIC FUNCTION PANEL
ALT: 51
AST: 39 — ABNORMAL HIGH
Bilirubin, Direct: 0.2
Total Bilirubin: 0.6

## 2011-07-17 LAB — DIFFERENTIAL
Basophils Relative: 1
Eosinophils Absolute: 0.1
Eosinophils Relative: 1
Lymphocytes Relative: 14
Lymphs Abs: 0.7
Lymphs Abs: 1
Monocytes Absolute: 0.9
Monocytes Relative: 14 — ABNORMAL HIGH
Monocytes Relative: 14 — ABNORMAL HIGH
Neutro Abs: 5
Neutrophils Relative %: 71

## 2011-07-17 LAB — POCT I-STAT 3, ART BLOOD GAS (G3+): pH, Arterial: 7.406

## 2011-07-17 LAB — HIV ANTIBODY (ROUTINE TESTING W REFLEX): HIV: NONREACTIVE

## 2011-07-17 LAB — PHOSPHORUS
Phosphorus: 3.1
Phosphorus: 3.8
Phosphorus: 4
Phosphorus: 4.2

## 2011-07-17 LAB — CARDIAC PANEL(CRET KIN+CKTOT+MB+TROPI)
CK, MB: 1.2
CK, MB: 1.7
Troponin I: <0.01

## 2011-07-17 LAB — CK TOTAL AND CKMB (NOT AT ARMC): CK, MB: 1.6

## 2011-07-17 LAB — MAGNESIUM
Magnesium: 2.2
Magnesium: 2.3

## 2011-07-17 LAB — PROTIME-INR: INR: 1.2

## 2011-07-17 LAB — CULTURE, BAL-QUANTITATIVE W GRAM STAIN
Colony Count: NO GROWTH
Culture: NO GROWTH

## 2011-07-17 LAB — RETICULOCYTES: Retic Count, Absolute: 42.7

## 2011-07-17 LAB — P CARINII SMEAR DFA

## 2011-07-17 LAB — B-NATRIURETIC PEPTIDE (CONVERTED LAB): Pro B Natriuretic peptide (BNP): 108 — ABNORMAL HIGH

## 2011-07-17 LAB — TSH: TSH: 1.372

## 2011-07-17 LAB — TROPONIN I: Troponin I: 0.01

## 2011-07-17 LAB — SEDIMENTATION RATE: Sed Rate: 109 — ABNORMAL HIGH

## 2011-07-17 LAB — VIRUS CULTURE

## 2011-07-18 LAB — CBC
HCT: 30.4 — ABNORMAL LOW
Hemoglobin: 10 — ABNORMAL LOW
Hemoglobin: 10.4 — ABNORMAL LOW
MCHC: 34.4
Platelets: 482 — ABNORMAL HIGH
Platelets: 485 — ABNORMAL HIGH
RBC: 3.26 — ABNORMAL LOW
RBC: 3.74 — ABNORMAL LOW
RDW: 13.8
WBC: 10.3
WBC: 18.3 — ABNORMAL HIGH

## 2011-07-18 LAB — BASIC METABOLIC PANEL
BUN: 21
Calcium: 8.8
Chloride: 108
Creatinine, Ser: 0.79
Creatinine, Ser: 0.82
GFR calc Af Amer: >60
GFR calc Af Amer: >60
GFR calc non Af Amer: >60
GFR calc non Af Amer: >60

## 2011-07-18 LAB — GLUCOSE, CAPILLARY
Glucose-Capillary: 118 — ABNORMAL HIGH
Glucose-Capillary: 128 — ABNORMAL HIGH
Glucose-Capillary: 139 — ABNORMAL HIGH
Glucose-Capillary: 146 — ABNORMAL HIGH
Glucose-Capillary: 149 — ABNORMAL HIGH
Glucose-Capillary: 151 — ABNORMAL HIGH
Glucose-Capillary: 152 — ABNORMAL HIGH
Glucose-Capillary: 156 — ABNORMAL HIGH
Glucose-Capillary: 164 — ABNORMAL HIGH
Glucose-Capillary: 180 — ABNORMAL HIGH
Glucose-Capillary: 92

## 2011-07-18 LAB — COMPREHENSIVE METABOLIC PANEL
ALT: 110 — ABNORMAL HIGH
AST: 39 — ABNORMAL HIGH
Alkaline Phosphatase: 97
CO2: 24
Calcium: 8.9
GFR calc Af Amer: >60
GFR calc non Af Amer: >60
Glucose, Bld: 154 — ABNORMAL HIGH
Potassium: 3.8
Sodium: 140

## 2011-07-18 LAB — CARDIAC PANEL(CRET KIN+CKTOT+MB+TROPI)
CK, MB: 0.9
Relative Index: INVALID
Troponin I: 0.01

## 2011-08-08 ENCOUNTER — Telehealth: Payer: Self-pay | Admitting: Pulmonary Disease

## 2011-08-08 ENCOUNTER — Encounter: Payer: Self-pay | Admitting: Pulmonary Disease

## 2011-08-08 ENCOUNTER — Ambulatory Visit (INDEPENDENT_AMBULATORY_CARE_PROVIDER_SITE_OTHER): Payer: Medicare Other | Admitting: Pulmonary Disease

## 2011-08-08 DIAGNOSIS — J8410 Pulmonary fibrosis, unspecified: Secondary | ICD-10-CM

## 2011-08-08 DIAGNOSIS — R0902 Hypoxemia: Secondary | ICD-10-CM

## 2011-08-08 DIAGNOSIS — J438 Other emphysema: Secondary | ICD-10-CM

## 2011-08-08 MED ORDER — BUDESONIDE-FORMOTEROL FUMARATE 160-4.5 MCG/ACT IN AERO: 2.0000 | INHALATION_SPRAY | Freq: Two times a day (BID) | RESPIRATORY_TRACT | Status: DC

## 2011-08-08 MED ORDER — IPRATROPIUM-ALBUTEROL 18-103 MCG/ACT IN AERO: 2.0000 | INHALATION_SPRAY | RESPIRATORY_TRACT | Status: DC | PRN

## 2011-08-08 NOTE — Telephone Encounter (Signed)
Called pharmacy spoke with [name] her the directions on the combivent inhaler.

## 2011-08-08 NOTE — Assessment & Plan Note (Signed)
Continue 1.5 to 2 liters oxygen with exertion and sleep.

## 2011-08-08 NOTE — Progress Notes (Signed)
Chief Complaint  Patient presents with  . Follow-up    Pt states he has his good and bad days. Pt has very little dry cough. Pt c/o wheezing, chest tightness x 2 days    History of Present Illness:  55 yo male former smoker with COPD/emphysema.  He now has insurance.  He has been using combivent twice per day since his allergies have gotten worse.  He has more wheeze and cough with clear sputum.  He has clear nasal drainage.  He has been using 1.5 to 2 liters oxygen with exertion and sleep.  He has a portable pulse oximeter and his reading have gone down to mid 80's at times w/o oxygen.  When this happens he feels tired and fatigued.  He had his flu shot last month.   Past Medical History  Diagnosis Date  . GERD (gastroesophageal reflux disease)   . Depression   . ILD (interstitial lung disease)   . Panic attack   . Dyslipidemia   . Hypertension     Echo 10/09 EF 60%  . Dyslipidemia   . COPD (chronic obstructive pulmonary disease) 09/23/08    -PFT 09/23/08 FEV1 2.89(84%), FVC 3.70 (78%), TLC 5.28 (78%), DLCO 64% mixed obstructive and  restrictive pattern.  Spirometry 03/11/09 FEV 3.35 (97%),FVC 4.10 (86%), FEV% 82. Steroid responsive pneumonitis 04/09- required mechanical ventilation during hospitalization - The following hospital test were negative: PCP,ANA,HIV,BAL. - ACE25,RF20,CRP 28,A1AT 263, ESR 109  . History of skin cancer     Past Surgical History  Procedure Date  . Hernia repair     2 hernia  . Left shoulder surgery   . Ganglion cyst on foot   . Bronchoscopy 10/09    Current Outpatient Prescriptions on File Prior to Visit  Medication Sig Dispense Refill  . aspirin 81 MG EC tablet Take 81 mg by mouth daily.        . clonazePAM (KLONOPIN) 1 MG tablet Take 1 mg by mouth 2 (two) times daily as needed. Take 1/2 -1 by mouth two times a day as needed for anxiety       . Colesevelam HCl 3.75 G PACK Take 1 each by mouth daily.  90 each  3  . hydrochlorothiazide 25 MG tablet  Take 25 mg by mouth daily.        . NON FORMULARY Oxygen continuous 1.5 liters with activity         Allergies  Allergen Reactions  . Latex   . Morphine   . Statins     REACTION: weakness, muscle pains    Physical Exam:  Blood pressure 118/82, pulse 117, temperature 98.2 F (36.8 C), temperature source Oral, height 5\' 10"  (1.778 m), weight 213 lb 9.6 oz (96.888 kg), SpO2 96.00%.  General - Obese HEENT - Clear nasal drainage, no sinus tenderness, TM clear, no oral exudate, no LAN Cardiac - S1S2 regular, no murmur Chest - prolonged exhalation, no wheeze/rales/dullness Abdomen - soft, nontender Extremities - no e/c/c Skin - no rashes Neurologic - normal strength, CN intact Psychiatric - normal mood, behavior  Lab Results  Component Value Date   WBC 8.8 07/05/2011   HGB 14.8 07/05/2011   HCT 43.6 07/05/2011   PLT 234.0 07/05/2011   GLUCOSE 74 07/05/2011   CHOL 266* 07/05/2011   TRIG 138.0 07/05/2011   HDL 42.70 07/05/2011   LDLDIRECT 191.1 07/05/2011   ALT 31 07/05/2011   AST 24 07/05/2011   NA 144 07/05/2011   K 3.7 07/05/2011  CL 110 07/05/2011   CREATININE 1.1 07/05/2011   BUN 13 07/05/2011   CO2 27 07/05/2011   TSH 1.29 07/05/2011   PSA 0.50 03/07/2011   INR 1.2 08/04/2008   HGBA1C 5.5 07/05/2011   CHEST - 2 VIEW 03/07/11 Comparison: 07/21/2009  Findings: Chronic interstitial markings, likely reflecting emphysema. No pleural effusion or pneumothorax.  Cardiomediastinal silhouette is within normal limits.  Mild degenerative changes of the visualized thoracolumbar spine.  IMPRESSION:  No evidence of acute cardiopulmonary disease. Emphysematous changes.   Assessment/Plan:  EMPHYSEMA He has noticed more trouble with his breathing with increase in his allergies.  Will have him try symbicort (have given him sample of this).  He is to continue as needed combivent.  Advised him he can try using nasal irrigation and allegra as needed for his sinuses.  Will arrange for pulmonary  function test and call him with results.  Hypoxemia Continue 1.5 to 2 liters oxygen with exertion and sleep.     Outpatient Encounter Prescriptions as of 08/08/2011  Medication Sig Dispense Refill  . albuterol-ipratropium (COMBIVENT) 18-103 MCG/ACT inhaler Inhale 2 puffs into the lungs as needed.  1 Inhaler  11  . aspirin 81 MG EC tablet Take 81 mg by mouth daily.        . clonazePAM (KLONOPIN) 1 MG tablet Take 1 mg by mouth 2 (two) times daily as needed. Take 1/2 -1 by mouth two times a day as needed for anxiety       . Colesevelam HCl 3.75 G PACK Take 1 each by mouth daily.  90 each  3  . fexofenadine (ALLEGRA) 180 MG tablet Take 180 mg by mouth daily.        . hydrochlorothiazide 25 MG tablet Take 25 mg by mouth daily.        Marland Kitchen ibuprofen (ADVIL,MOTRIN) 200 MG tablet Take 200 mg by mouth every 6 (six) hours as needed.        . NON FORMULARY Oxygen continuous 1.5 liters with activity       . DISCONTD: albuterol-ipratropium (COMBIVENT) 18-103 MCG/ACT inhaler Inhale 2 puffs into the lungs as needed.        . budesonide-formoterol (SYMBICORT) 160-4.5 MCG/ACT inhaler Inhale 2 puffs into the lungs 2 (two) times daily.  1 Inhaler  12    Socrates Cahoon 08/08/2011, 3:29 PM

## 2011-08-08 NOTE — Patient Instructions (Signed)
Will schedule breathing test (PFT) and call with results Symbicort two puffs twice per day, and rinse mouth after using Try using nasal irrigation (saline sinus rinse) as needed Follow up in 6 months

## 2011-08-08 NOTE — Assessment & Plan Note (Signed)
He has noticed more trouble with his breathing with increase in his allergies.  Will have him try symbicort (have given him sample of this).  He is to continue as needed combivent.  Advised him he can try using nasal irrigation and allegra as needed for his sinuses.  Will arrange for pulmonary function test and call him with results.

## 2011-08-23 ENCOUNTER — Ambulatory Visit (INDEPENDENT_AMBULATORY_CARE_PROVIDER_SITE_OTHER): Payer: Medicare Other | Admitting: Pulmonary Disease

## 2011-08-23 DIAGNOSIS — J438 Other emphysema: Secondary | ICD-10-CM

## 2011-08-23 LAB — PULMONARY FUNCTION TEST

## 2011-08-23 NOTE — Progress Notes (Signed)
PFT was done today.  

## 2011-09-19 ENCOUNTER — Encounter: Payer: Self-pay | Admitting: Pulmonary Disease

## 2011-09-19 ENCOUNTER — Telehealth: Payer: Self-pay | Admitting: Pulmonary Disease

## 2011-09-19 NOTE — Telephone Encounter (Signed)
PFT 08/23/11>>FEV1 3.68(110%), FEV1% 75, TLC 5.38(80%), DLCO 60%, no BD  Will have my nurse inform patient that PFT shows mild changes of emphysema, but numbers looked better compared to 2009.  No change to current inhaler regimen.

## 2011-09-20 NOTE — Telephone Encounter (Signed)
I spoke with patient about results and he verbalized understanding and had no questions 

## 2011-10-11 ENCOUNTER — Encounter: Payer: Self-pay | Admitting: Pulmonary Disease

## 2011-11-02 ENCOUNTER — Encounter: Payer: Self-pay | Admitting: Internal Medicine

## 2011-11-02 ENCOUNTER — Other Ambulatory Visit (INDEPENDENT_AMBULATORY_CARE_PROVIDER_SITE_OTHER): Payer: Medicare Other

## 2011-11-02 ENCOUNTER — Ambulatory Visit (INDEPENDENT_AMBULATORY_CARE_PROVIDER_SITE_OTHER): Payer: Medicare Other | Admitting: Internal Medicine

## 2011-11-02 DIAGNOSIS — I10 Essential (primary) hypertension: Secondary | ICD-10-CM

## 2011-11-02 DIAGNOSIS — E782 Mixed hyperlipidemia: Secondary | ICD-10-CM

## 2011-11-02 DIAGNOSIS — E119 Type 2 diabetes mellitus without complications: Secondary | ICD-10-CM

## 2011-11-02 LAB — CBC WITH DIFFERENTIAL/PLATELET
Basophils Absolute: 0.1 10*3/uL (ref 0.0–0.1)
Eosinophils Absolute: 0.3 10*3/uL (ref 0.0–0.7)
HCT: 43.7 % (ref 39.0–52.0)
Hemoglobin: 15.1 g/dL (ref 13.0–17.0)
Lymphs Abs: 2 10*3/uL (ref 0.7–4.0)
MCHC: 34.7 g/dL (ref 30.0–36.0)
Monocytes Relative: 8.3 % (ref 3.0–12.0)
Neutro Abs: 6.5 10*3/uL (ref 1.4–7.7)
RDW: 13.4 % (ref 11.5–14.6)

## 2011-11-02 LAB — URINALYSIS, ROUTINE W REFLEX MICROSCOPIC
Hgb urine dipstick: NEGATIVE
Ketones, ur: NEGATIVE
Leukocytes, UA: NEGATIVE
Specific Gravity, Urine: >=1.03 (ref 1.000–1.030)
Urine Glucose: NEGATIVE
Urobilinogen, UA: 0.2 (ref 0.0–1.0)

## 2011-11-02 NOTE — Progress Notes (Signed)
Subjective:    Patient ID: [name], male    DOB: 04-10-1956, 56 y.o.   MRN: [id number]  Diabetes He presents for his follow-up diabetic visit. He has type 2 diabetes mellitus. His disease course has been stable. There are no hypoglycemic associated symptoms. Pertinent negatives for hypoglycemia include no dizziness, headaches, seizures, speech difficulty, sweats or tremors. Pertinent negatives for diabetes include no blurred vision, no chest pain, no fatigue, no foot paresthesias, no foot ulcerations, no polydipsia, no polyphagia, no polyuria, no visual change, no weakness and no weight loss. There are no hypoglycemic complications. Symptoms are stable. There are no diabetic complications. Current diabetic treatment includes diet. He is compliant with treatment all of the time. His weight is stable. He is following a generally healthy diet. Meal planning includes avoidance of concentrated sweets. He has not had a previous visit with a dietician. He participates in exercise intermittently. There is no change in his home blood glucose trend. An ACE inhibitor/angiotensin II receptor blocker is not being taken. He does not see a podiatrist.Eye exam is not current.  Hypertension This is a chronic problem. The current episode started more than 1 year ago. The problem has been gradually improving since onset. The problem is controlled. Pertinent negatives include no anxiety, blurred vision, chest pain, headaches, malaise/fatigue, neck pain, orthopnea, palpitations, peripheral edema, PND, shortness of breath or sweats. Past treatments include diuretics. The current treatment provides significant improvement. There are no compliance problems.       Review of Systems  Constitutional: Negative.  Negative for weight loss, malaise/fatigue and fatigue.  HENT: Negative.  Negative for neck pain.   Eyes: Negative.  Negative for blurred vision.  Respiratory: Negative for cough, chest tightness, shortness of  breath, wheezing and stridor.   Cardiovascular: Negative for chest pain, palpitations, orthopnea, leg swelling and PND.  Gastrointestinal: Negative for nausea, vomiting, abdominal pain, diarrhea, constipation and anal bleeding.  Genitourinary: Negative.  Negative for polyuria.  Musculoskeletal: Negative for myalgias, back pain, joint swelling, arthralgias and gait problem.  Skin: Negative.   Neurological: Negative for dizziness, tremors, seizures, syncope, facial asymmetry, speech difficulty, weakness, light-headedness, numbness and headaches.  Hematological: Negative for polydipsia, polyphagia and adenopathy. Does not bruise/bleed easily.  Psychiatric/Behavioral: Negative.        Objective:   Physical Exam  Vitals reviewed. Constitutional: He is oriented to person, place, and time. He appears well-developed and well-nourished. No distress.  HENT:  Head: Normocephalic and atraumatic.  Mouth/Throat: Oropharynx is clear and moist. No oropharyngeal exudate.  Eyes: Conjunctivae are normal. Right eye exhibits no discharge. Left eye exhibits no discharge. No scleral icterus.  Neck: Normal range of motion. Neck supple. No JVD present. No tracheal deviation present. No thyromegaly present.  Cardiovascular: Normal rate, regular rhythm, normal heart sounds and intact distal pulses.  Exam reveals no gallop and no friction rub.   No murmur heard. Pulmonary/Chest: Effort normal and breath sounds normal. No stridor. No respiratory distress. He has no wheezes. He has no rales. He exhibits no tenderness.  Abdominal: Soft. Bowel sounds are normal. He exhibits no distension and no mass. There is no tenderness. There is no rebound and no guarding.  Musculoskeletal: Normal range of motion. He exhibits no edema and no tenderness.  Lymphadenopathy:    He has no cervical adenopathy.  Neurological: He is oriented to person, place, and time.  Skin: Skin is warm and dry. No rash noted. He is not diaphoretic. No  erythema. No pallor.  Psychiatric: He  has a normal mood and affect. His behavior is normal. Judgment and thought content normal.      Lab Results  Component Value Date   WBC 8.8 07/05/2011   HGB 14.8 07/05/2011   HCT 43.6 07/05/2011   PLT 234.0 07/05/2011   GLUCOSE 74 07/05/2011   CHOL 266* 07/05/2011   TRIG 138.0 07/05/2011   HDL 42.70 07/05/2011   LDLDIRECT 191.1 07/05/2011   ALT 31 07/05/2011   AST 24 07/05/2011   NA 144 07/05/2011   K 3.7 07/05/2011   CL 110 07/05/2011   CREATININE 1.1 07/05/2011   BUN 13 07/05/2011   CO2 27 07/05/2011   TSH 1.29 07/05/2011   PSA 0.50 03/07/2011   INR 1.2 08/04/2008   HGBA1C 5.5 07/05/2011      Assessment & Plan:

## 2011-11-02 NOTE — Patient Instructions (Signed)

## 2011-11-03 LAB — COMPREHENSIVE METABOLIC PANEL
AST: 20 U/L (ref 0–37)
BUN: 20 mg/dL (ref 6–23)
Calcium: 9.1 mg/dL (ref 8.4–10.5)
Chloride: 105 mEq/L (ref 96–112)
Creatinine, Ser: 1 mg/dL (ref 0.4–1.5)

## 2011-11-03 LAB — TSH: TSH: 1.48 u[IU]/mL (ref 0.35–5.50)

## 2011-11-03 LAB — LIPID PANEL: Cholesterol: 240 mg/dL — ABNORMAL HIGH (ref 0–200)

## 2011-11-03 NOTE — Assessment & Plan Note (Signed)
I will check his a1c today 

## 2011-11-03 NOTE — Assessment & Plan Note (Signed)
He will not take a statin, I will check his trig level today

## 2011-11-03 NOTE — Assessment & Plan Note (Signed)
His BP is well controlled, I will check his lytes and renal function 

## 2011-12-07 ENCOUNTER — Telehealth: Payer: Self-pay

## 2011-12-07 NOTE — Telephone Encounter (Signed)
Patient called [facility] requesting lab results from 10/2011, never received letter thanks

## 2011-12-07 NOTE — Telephone Encounter (Signed)
Last labs showed high blood sugar but not diabetic yet, and high cholesterol and triglycerides, all others were ok

## 2011-12-08 NOTE — Telephone Encounter (Signed)
Letter mailed

## 2011-12-27 ENCOUNTER — Other Ambulatory Visit: Payer: Self-pay | Admitting: Internal Medicine

## 2012-02-06 ENCOUNTER — Encounter: Payer: Self-pay | Admitting: Pulmonary Disease

## 2012-02-06 ENCOUNTER — Ambulatory Visit (INDEPENDENT_AMBULATORY_CARE_PROVIDER_SITE_OTHER): Payer: Medicare Other | Admitting: Pulmonary Disease

## 2012-02-06 VITALS — BP 122/76 | HR 93 | Temp 98.3°F | Ht 70.0 in | Wt 217.2 lb

## 2012-02-06 DIAGNOSIS — R05 Cough: Secondary | ICD-10-CM | POA: Insufficient documentation

## 2012-02-06 DIAGNOSIS — J438 Other emphysema: Secondary | ICD-10-CM

## 2012-02-06 DIAGNOSIS — J8410 Pulmonary fibrosis, unspecified: Secondary | ICD-10-CM

## 2012-02-06 DIAGNOSIS — J309 Allergic rhinitis, unspecified: Secondary | ICD-10-CM

## 2012-02-06 DIAGNOSIS — R0902 Hypoxemia: Secondary | ICD-10-CM

## 2012-02-06 NOTE — Progress Notes (Signed)
Chief Complaint  Patient presents with  . Follow-up    Pt states he still gets SOB w/ exertion, little wheezing and chest tx, cough w/ clear phlem, PND due to allergies--taking allegra to help with allergies    History of Present Illness: [name] is a 56 y.o. male former smoker with Emphysema and exertional/nocturnal hypoxemia.  Has hx of steroid responsive pneumonitis in 2009.  He has noticed more sinus congestion, and ear pressure with increase in allergies.  He has dyspnea on exertion, but no worse that usual.  He will use his oxygen at times with exertion.  He does not always use it with sleep.  He is not having much cough.  He occasionally brings up clear sputum.  He uses his combivent 2 or 3 times per week.  He was prescribed symbicort, but has not started using this.   Past Medical History  Diagnosis Date  . GERD (gastroesophageal reflux disease)   . Depression   . ILD (interstitial lung disease)   . Panic attack   . Dyslipidemia   . Hypertension     Echo 10/09 EF 60%  . Dyslipidemia   . COPD (chronic obstructive pulmonary disease) 09/23/08    -PFT 09/23/08 FEV1 2.89(84%), FVC 3.70 (78%), TLC 5.28 (78%), DLCO 64% mixed obstructive and  restrictive pattern.  Spirometry 03/11/09 FEV 3.35 (97%),FVC 4.10 (86%), FEV% 82. Steroid responsive pneumonitis 04/09- required mechanical ventilation during hospitalization - The following hospital test were negative: PCP,ANA,HIV,BAL. - ACE25,RF20,CRP 28,A1AT 263, ESR 109  . History of skin cancer     Past Surgical History  Procedure Date  . Hernia repair     2 hernia  . Left shoulder surgery   . Ganglion cyst on foot   . Bronchoscopy 10/09    Allergies  Allergen Reactions  . Latex   . Morphine   . Statins     REACTION: weakness, muscle pains    Physical Exam:  Blood pressure 122/76, pulse 93, temperature 98.3 F (36.8 C), temperature source Oral, height 5\' 10"  (1.778 m), weight 217 lb 3.2 oz (98.521 kg), SpO2 98.00%. Body  mass index is 31.16 kg/(m^2). Wt Readings from Last 2 Encounters:  02/06/12 217 lb 3.2 oz (98.521 kg)  11/02/11 210 lb (95.255 kg)    General - Obese  HEENT - Clear nasal drainage, no sinus tenderness, TM clear, no oral exudate, no LAN  Cardiac - S1S2 regular, no murmur  Chest - prolonged exhalation, no wheeze/rales/dullness  Abdomen - soft, nontender  Extremities - no e/c/c  Skin - no rashes  Neurologic - normal strength, CN intact  Psychiatric - normal mood, behavior  PFT 08/23/11>>FEV1 3.68(110%), FEV1% 75, TLC 5.38(80%), DLCO 60%, no BD  Assessment/Plan:  Outpatient Encounter Prescriptions as of 02/06/2012  Medication Sig Dispense Refill  . albuterol-ipratropium (COMBIVENT) 18-103 MCG/ACT inhaler Inhale 2 puffs into the lungs as needed.  1 Inhaler  11  . aspirin 81 MG EC tablet Take 81 mg by mouth daily.        . budesonide-formoterol (SYMBICORT) 160-4.5 MCG/ACT inhaler Inhale 2 puffs into the lungs 2 (two) times daily.  1 Inhaler  12  . fexofenadine (ALLEGRA) 180 MG tablet Take 180 mg by mouth daily.        . hydrochlorothiazide (HYDRODIURIL) 25 MG tablet TAKE ONE TABLET BY MOUTH EVERY DAY  90 tablet  1  . ibuprofen (ADVIL,MOTRIN) 200 MG tablet Take 200 mg by mouth every 6 (six) hours as needed.        Marland Kitchen  NON FORMULARY Oxygen continuous 1.5 liters with activity       . DISCONTD: clonazePAM (KLONOPIN) 1 MG tablet Take 1 mg by mouth 2 (two) times daily as needed. Take 1/2 -1 by mouth two times a day as needed for anxiety       . DISCONTD: Colesevelam HCl 3.75 G PACK Take 1 each by mouth daily.  90 each  3    [name] Pager:  [phone] 02/06/2012, 2:04 PM

## 2012-02-06 NOTE — Assessment & Plan Note (Signed)
Had steroid responsive pneumonitis in 2009.  He has diffusion defect on PFT, but this has been stable.  Recent chest xrays have been stable also.  Monitor clinically.   

## 2012-02-06 NOTE — Assessment & Plan Note (Signed)
He has required supplemental oxygen with exertion and sleep since he was in hospital in 2009 with steroid responsive pneumonitis.     

## 2012-02-06 NOTE — Assessment & Plan Note (Signed)
He can continue combivent as needed.  I don't think he needs symbicort at this time.  Will follow up in one year.  He is to call if help needed sooner.

## 2012-02-06 NOTE — Assessment & Plan Note (Signed)
Continue allegra as needed.

## 2012-02-06 NOTE — Patient Instructions (Signed)
Follow up in one year Call if help needed sooner 

## 2012-05-01 ENCOUNTER — Encounter: Payer: Self-pay | Admitting: Internal Medicine

## 2012-05-01 ENCOUNTER — Other Ambulatory Visit (INDEPENDENT_AMBULATORY_CARE_PROVIDER_SITE_OTHER): Payer: Medicare Other

## 2012-05-01 ENCOUNTER — Ambulatory Visit (INDEPENDENT_AMBULATORY_CARE_PROVIDER_SITE_OTHER): Payer: Medicare Other | Admitting: Internal Medicine

## 2012-05-01 VITALS — BP 122/80 | HR 76 | Temp 97.2°F | Resp 16 | Wt 212.0 lb

## 2012-05-01 DIAGNOSIS — E119 Type 2 diabetes mellitus without complications: Secondary | ICD-10-CM

## 2012-05-01 DIAGNOSIS — I10 Essential (primary) hypertension: Secondary | ICD-10-CM

## 2012-05-01 LAB — URINALYSIS, ROUTINE W REFLEX MICROSCOPIC
Leukocytes, UA: NEGATIVE
Nitrite: NEGATIVE
Specific Gravity, Urine: 1.01 (ref 1.000–1.030)
Urobilinogen, UA: 0.2 (ref 0.0–1.0)
pH: 5.5 (ref 5.0–8.0)

## 2012-05-01 LAB — BASIC METABOLIC PANEL
CO2: 24 mEq/L (ref 19–32)
Calcium: 9.2 mg/dL (ref 8.4–10.5)
Chloride: 106 mEq/L (ref 96–112)
Glucose, Bld: 116 mg/dL — ABNORMAL HIGH (ref 70–99)
Sodium: 139 mEq/L (ref 135–145)

## 2012-05-01 LAB — HEMOGLOBIN A1C: Hgb A1c MFr Bld: 5.6 % (ref 4.6–6.5)

## 2012-05-01 LAB — MICROALBUMIN / CREATININE URINE RATIO
Creatinine,U: 71.2 mg/dL
Microalb Creat Ratio: 0.3 mg/g (ref 0.0–30.0)

## 2012-05-01 NOTE — Progress Notes (Signed)
  Subjective:    Patient ID: [name], male    DOB: 1956/06/21, 56 y.o.   MRN: [id number]  Hypertension This is a chronic problem. The current episode started more than 1 year ago. The problem has been gradually improving since onset. The problem is controlled. Associated symptoms include shortness of breath (chronic, unchanged). Pertinent negatives include no anxiety, blurred vision, chest pain, headaches, malaise/fatigue, neck pain, orthopnea, palpitations, peripheral edema, PND or sweats. There are no associated agents to hypertension. Risk factors for coronary artery disease include no known risk factors. Past treatments include diuretics. The current treatment provides significant improvement. There are no compliance problems.       Review of Systems  Constitutional: Negative.  Negative for malaise/fatigue.  HENT: Negative.  Negative for neck pain.   Eyes: Negative.  Negative for blurred vision.  Respiratory: Positive for shortness of breath (chronic, unchanged).   Cardiovascular: Negative.  Negative for chest pain, palpitations, orthopnea and PND.  Gastrointestinal: Negative.   Genitourinary: Negative.   Musculoskeletal: Negative.   Skin: Negative.   Neurological: Negative.  Negative for headaches.  Hematological: Negative.   Psychiatric/Behavioral: Negative.        Objective:   Physical Exam  Vitals reviewed. Constitutional: He is oriented to person, place, and time. He appears well-developed and well-nourished. No distress.  HENT:  Head: Normocephalic and atraumatic.  Mouth/Throat: Oropharynx is clear and moist. No oropharyngeal exudate.  Eyes: Conjunctivae are normal. Right eye exhibits no discharge. Left eye exhibits no discharge. No scleral icterus.  Neck: Normal range of motion. Neck supple. No JVD present. No tracheal deviation present. No thyromegaly present.  Cardiovascular: Normal rate, regular rhythm, normal heart sounds and intact distal pulses.  Exam reveals  no gallop and no friction rub.   No murmur heard. Pulmonary/Chest: Effort normal and breath sounds normal. No stridor. No respiratory distress. He has no decreased breath sounds. He has no wheezes. He has no rhonchi. He has no rales. He exhibits no tenderness.  Abdominal: Soft. Bowel sounds are normal. He exhibits no distension and no mass. There is no tenderness. There is no rebound and no guarding.  Musculoskeletal: Normal range of motion. He exhibits no edema and no tenderness.  Lymphadenopathy:    He has no cervical adenopathy.  Neurological: He is oriented to person, place, and time.  Skin: Skin is warm and dry. No rash noted. He is not diaphoretic. No erythema. No pallor.  Psychiatric: He has a normal mood and affect. His behavior is normal. Judgment and thought content normal.      Lab Results  Component Value Date   WBC 9.8 11/02/2011   HGB 15.1 11/02/2011   HCT 43.7 11/02/2011   PLT 232.0 11/02/2011   GLUCOSE 109* 11/02/2011   CHOL 240* 11/02/2011   TRIG 180.0* 11/02/2011   HDL 37.80* 11/02/2011   LDLDIRECT 163.2 11/02/2011   ALT 27 11/02/2011   AST 20 11/02/2011   NA 137 11/02/2011   K 4.0 11/02/2011   CL 105 11/02/2011   CREATININE 1.0 11/02/2011   BUN 20 11/02/2011   CO2 25 11/02/2011   TSH 1.48 11/02/2011   PSA 0.50 03/07/2011   INR 1.2 08/04/2008   HGBA1C 5.6 11/02/2011      Assessment & Plan:

## 2012-05-01 NOTE — Patient Instructions (Signed)

## 2012-05-02 NOTE — Assessment & Plan Note (Signed)
I will check his a1c today and will see if he needs any meds, I will also check his renal function

## 2012-05-02 NOTE — Assessment & Plan Note (Signed)
His BP is well controlled, I will check his lytes and renal function today 

## 2012-07-14 ENCOUNTER — Other Ambulatory Visit: Payer: Self-pay | Admitting: Internal Medicine

## 2012-07-16 ENCOUNTER — Other Ambulatory Visit: Payer: Self-pay | Admitting: Internal Medicine

## 2012-07-17 ENCOUNTER — Other Ambulatory Visit: Payer: Self-pay | Admitting: Internal Medicine

## 2012-09-04 ENCOUNTER — Encounter: Payer: Self-pay | Admitting: Internal Medicine

## 2012-09-04 ENCOUNTER — Ambulatory Visit (INDEPENDENT_AMBULATORY_CARE_PROVIDER_SITE_OTHER): Payer: Medicare Other | Admitting: Internal Medicine

## 2012-09-04 ENCOUNTER — Other Ambulatory Visit (INDEPENDENT_AMBULATORY_CARE_PROVIDER_SITE_OTHER): Payer: Medicare Other

## 2012-09-04 VITALS — BP 118/94 | HR 92 | Temp 97.2°F | Resp 20 | Ht 70.0 in | Wt 214.0 lb

## 2012-09-04 DIAGNOSIS — I10 Essential (primary) hypertension: Secondary | ICD-10-CM

## 2012-09-04 DIAGNOSIS — Z0000 Encounter for general adult medical examination without abnormal findings: Secondary | ICD-10-CM

## 2012-09-04 DIAGNOSIS — Z125 Encounter for screening for malignant neoplasm of prostate: Secondary | ICD-10-CM

## 2012-09-04 LAB — COMPREHENSIVE METABOLIC PANEL
Albumin: 4.2 g/dL (ref 3.5–5.2)
BUN: 14 mg/dL (ref 6–23)
CO2: 25 mEq/L (ref 19–32)
Calcium: 9.4 mg/dL (ref 8.4–10.5)
Chloride: 104 mEq/L (ref 96–112)
Glucose, Bld: 117 mg/dL — ABNORMAL HIGH (ref 70–99)
Potassium: 4 mEq/L (ref 3.5–5.1)

## 2012-09-04 LAB — CBC WITH DIFFERENTIAL/PLATELET
Basophils Relative: 1.2 % (ref 0.0–3.0)
Eosinophils Absolute: 0.3 10*3/uL (ref 0.0–0.7)
Eosinophils Relative: 3.3 % (ref 0.0–5.0)
Lymphocytes Relative: 25.6 % (ref 12.0–46.0)
Neutrophils Relative %: 61.2 % (ref 43.0–77.0)
RBC: 5.07 Mil/uL (ref 4.22–5.81)
WBC: 9.2 10*3/uL (ref 4.5–10.5)

## 2012-09-04 LAB — TSH: TSH: 1.3 u[IU]/mL (ref 0.35–5.50)

## 2012-09-04 MED ORDER — HYDROCHLOROTHIAZIDE 25 MG PO TABS: 25.0000 mg | ORAL_TABLET | Freq: Every day | ORAL | Status: DC

## 2012-09-04 MED ORDER — NEBIVOLOL HCL 5 MG PO TABS: 5.0000 mg | ORAL_TABLET | Freq: Every day | ORAL | Status: DC

## 2012-09-04 NOTE — Progress Notes (Signed)
Subjective:    Patient ID: [name], male    DOB: [birth year], 56 y.o.   MRN: [id number]  Hypertension This is a chronic problem. The current episode started more than 1 year ago. The problem has been gradually worsening since onset. The problem is uncontrolled. Associated symptoms include shortness of breath (chronic,unchanged). Pertinent negatives include no anxiety, blurred vision, chest pain, headaches, malaise/fatigue, neck pain, orthopnea, palpitations, peripheral edema, PND or sweats. There are no associated agents to hypertension. Past treatments include diuretics. The current treatment provides moderate improvement. Compliance problems include exercise and diet.       Review of Systems  Constitutional: Negative for fever, chills, malaise/fatigue, diaphoresis, activity change, appetite change, fatigue and unexpected weight change.  HENT: Negative.  Negative for neck pain.   Eyes: Negative.  Negative for blurred vision.  Respiratory: Positive for shortness of breath (chronic,unchanged). Negative for apnea, cough, choking, chest tightness, wheezing and stridor.   Cardiovascular: Negative for chest pain, palpitations, orthopnea, leg swelling and PND.  Gastrointestinal: Negative for nausea, vomiting, abdominal pain, diarrhea, constipation and blood in stool.  Genitourinary: Negative.   Musculoskeletal: Negative for myalgias, back pain, joint swelling, arthralgias and gait problem.  Skin: Negative for color change, pallor, rash and wound.  Neurological: Negative.  Negative for headaches.  Hematological: Negative for adenopathy. Does not bruise/bleed easily.  Psychiatric/Behavioral: Negative.        Objective:   Physical Exam  Vitals reviewed. Constitutional: He is oriented to person, place, and time. He appears well-developed and well-nourished. No distress.  HENT:  Head: Normocephalic and atraumatic.  Mouth/Throat: Oropharynx is clear and moist. No oropharyngeal exudate.    Eyes: Conjunctivae normal are normal. Right eye exhibits no discharge. Left eye exhibits no discharge. No scleral icterus.  Neck: Normal range of motion. Neck supple. No JVD present. No tracheal deviation present. No thyromegaly present.  Cardiovascular: Normal rate, regular rhythm, normal heart sounds and intact distal pulses.  Exam reveals no gallop and no friction rub.   No murmur heard. Pulmonary/Chest: Effort normal and breath sounds normal. No stridor. No respiratory distress. He has no wheezes. He has no rales. He exhibits no tenderness.  Abdominal: Soft. Bowel sounds are normal. He exhibits no distension and no mass. There is no tenderness. There is no rebound and no guarding. Hernia confirmed negative in the right inguinal area and confirmed negative in the left inguinal area.  Genitourinary: Rectum normal, prostate normal, testes normal and penis normal. Rectal exam shows no external hemorrhoid, no internal hemorrhoid, no fissure, no mass and no tenderness. Guaiac negative stool. Prostate is not enlarged and not tender. Right testis shows no mass, no swelling and no tenderness. Right testis is descended. Left testis shows no mass, no swelling and no tenderness. Left testis is descended. Circumcised. No penile erythema or penile tenderness. No discharge found.  Musculoskeletal: Normal range of motion. He exhibits no edema and no tenderness.  Lymphadenopathy:    He has no cervical adenopathy.       Right: No inguinal adenopathy present.       Left: No inguinal adenopathy present.  Neurological: He is oriented to person, place, and time.  Skin: Skin is warm and dry. No rash noted. He is not diaphoretic. No erythema. No pallor.  Psychiatric: He has a normal mood and affect. His behavior is normal. Judgment and thought content normal.      Lab Results  Component Value Date   WBC 9.8 11/02/2011   HGB 15.1 11/02/2011  HCT 43.7 11/02/2011   PLT 232.0 11/02/2011   GLUCOSE 116* 05/01/2012    CHOL 240* 11/02/2011   TRIG 180.0* 11/02/2011   HDL 37.80* 11/02/2011   LDLDIRECT 163.2 11/02/2011   ALT 27 11/02/2011   AST 20 11/02/2011   NA 139 05/01/2012   K 4.0 05/01/2012   CL 106 05/01/2012   CREATININE 1.0 05/01/2012   BUN 16 05/01/2012   CO2 24 05/01/2012   TSH 1.48 11/02/2011   PSA 0.50 03/07/2011   INR 1.2 08/04/2008   HGBA1C 5.6 05/01/2012   MICROALBUR 0.2 05/01/2012      Assessment & Plan:

## 2012-09-04 NOTE — Patient Instructions (Signed)
Health Maintenance, Males A healthy lifestyle and preventative care can promote health and wellness.  Maintain regular health, dental, and eye exams.  Eat a healthy diet. Foods like vegetables, fruits, whole grains, low-fat dairy products, and lean protein foods contain the nutrients you need without too many calories. Decrease your intake of foods high in solid fats, added sugars, and salt. Get information about a proper diet from your caregiver, if necessary.  Regular physical exercise is one of the most important things you can do for your health. Most adults should get at least 150 minutes of moderate-intensity exercise (any activity that increases your heart rate and causes you to sweat) each week. In addition, most adults need muscle-strengthening exercises on 2 or more days a week.   Maintain a healthy weight. The body mass index (BMI) is a screening tool to identify possible weight problems. It provides an estimate of body fat based on height and weight. Your caregiver can help determine your BMI, and can help you achieve or maintain a healthy weight. For adults 20 years and older:  A BMI below 18.5 is considered underweight.  A BMI of 18.5 to 24.9 is normal.  A BMI of 25 to 29.9 is considered overweight.  A BMI of 30 and above is considered obese.  Maintain normal blood lipids and cholesterol by exercising and minimizing your intake of saturated fat. Eat a balanced diet with plenty of fruits and vegetables. Blood tests for lipids and cholesterol should begin at age 20 and be repeated every 5 years. If your lipid or cholesterol levels are high, you are over 50, or you are a high risk for heart disease, you may need your cholesterol levels checked more frequently.Ongoing high lipid and cholesterol levels should be treated with medicines, if diet and exercise are not effective.  If you smoke, find out from your caregiver how to quit. If you do not use tobacco, do not start.  If you  choose to drink alcohol, do not exceed 2 drinks per day. One drink is considered to be 12 ounces (355 mL) of beer, 5 ounces (148 mL) of wine, or 1.5 ounces (44 mL) of liquor.  Avoid use of street drugs. Do not share needles with anyone. Ask for help if you need support or instructions about stopping the use of drugs.  High blood pressure causes heart disease and increases the risk of stroke. Blood pressure should be checked at least every 1 to 2 years. Ongoing high blood pressure should be treated with medicines if weight loss and exercise are not effective.  If you are 45 to 58 years old, ask your caregiver if you should take aspirin to prevent heart disease.  Diabetes screening involves taking a blood sample to check your fasting blood sugar level. This should be done once every 3 years, after age 45, if you are within normal weight and without risk factors for diabetes. Testing should be considered at a younger age or be carried out more frequently if you are overweight and have at least 1 risk factor for diabetes.  Colorectal cancer can be detected and often prevented. Most routine colorectal cancer screening begins at the age of 50 and continues through age 75. However, your caregiver may recommend screening at an earlier age if you have risk factors for colon cancer. On a yearly basis, your caregiver may provide home test kits to check for hidden blood in the stool. Use of a small camera at the end of a tube,   to directly examine the colon (sigmoidoscopy or colonoscopy), can detect the earliest forms of colorectal cancer. Talk to your caregiver about this at age 50, when routine screening begins. Direct examination of the colon should be repeated every 5 to 10 years through age 75, unless early forms of pre-cancerous polyps or small growths are found.  Hepatitis C blood testing is recommended for all people born from 1945 through 1965 and any individual with known risks for hepatitis C.  Healthy  men should no longer receive prostate-specific antigen (PSA) blood tests as part of routine cancer screening. Consult with your caregiver about prostate cancer screening.  Testicular cancer screening is not recommended for adolescents or adult males who have no symptoms. Screening includes self-exam, caregiver exam, and other screening tests. Consult with your caregiver about any symptoms you have or any concerns you have about testicular cancer.  Practice safe sex. Use condoms and avoid high-risk sexual practices to reduce the spread of sexually transmitted infections (STIs).  Use sunscreen with a sun protection factor (SPF) of 30 or greater. Apply sunscreen liberally and repeatedly throughout the day. You should seek shade when your shadow is shorter than you. Protect yourself by wearing long sleeves, pants, a wide-brimmed hat, and sunglasses year round, whenever you are outdoors.  Notify your caregiver of new moles or changes in moles, especially if there is a change in shape or color. Also notify your caregiver if a mole is larger than the size of a pencil eraser.  A one-time screening for abdominal aortic aneurysm (AAA) and surgical repair of large AAAs by sound wave imaging (ultrasonography) is recommended for ages 65 to 75 years who are current or former smokers.  Stay current with your immunizations. Document Released: 03/30/2008 Document Revised: 12/25/2011 Document Reviewed: 02/27/2011 [employer] Patient Information 2013 [employer]. Hypertension As your heart beats, it forces blood through your arteries. This force is your blood pressure. If the pressure is too high, it is called hypertension (HTN) or high blood pressure. HTN is dangerous because you may have it and not know it. High blood pressure may mean that your heart has to work harder to pump blood. Your arteries may be narrow or stiff. The extra work puts you at risk for heart disease, stroke, and other problems.  Blood pressure  consists of two numbers, a higher number over a lower, 110/72, for example. It is stated as "110 over 72." The ideal is below 120 for the top number (systolic) and under 80 for the bottom (diastolic). Write down your blood pressure today. You should pay close attention to your blood pressure if you have certain conditions such as:  Heart failure.  Prior heart attack.  Diabetes  Chronic kidney disease.  Prior stroke.  Multiple risk factors for heart disease. To see if you have HTN, your blood pressure should be measured while you are seated with your arm held at the level of the heart. It should be measured at least twice. A one-time elevated blood pressure reading (especially in the Emergency Department) does not mean that you need treatment. There may be conditions in which the blood pressure is different between your right and left arms. It is important to see your caregiver soon for a recheck. Most people have essential hypertension which means that there is not a specific cause. This type of high blood pressure may be lowered by changing lifestyle factors such as:  Stress.  Smoking.  Lack of exercise.  Excessive weight.  Drug/tobacco/alcohol use.    Eating less salt. Most people do not have symptoms from high blood pressure until it has caused damage to the body. Effective treatment can often prevent, delay or reduce that damage. TREATMENT  When a cause has been identified, treatment for high blood pressure is directed at the cause. There are a large number of medications to treat HTN. These fall into several categories, and your caregiver will help you select the medicines that are best for you. Medications may have side effects. You should review side effects with your caregiver. If your blood pressure stays high after you have made lifestyle changes or started on medicines,   Your medication(s) may need to be changed.  Other problems may need to be addressed.  Be certain you  understand your prescriptions, and know how and when to take your medicine.  Be sure to follow up with your caregiver within the time frame advised (usually within two weeks) to have your blood pressure rechecked and to review your medications.  If you are taking more than one medicine to lower your blood pressure, make sure you know how and at what times they should be taken. Taking two medicines at the same time can result in blood pressure that is too low. SEEK IMMEDIATE MEDICAL CARE IF:  You develop a severe headache, blurred or changing vision, or confusion.  You have unusual weakness or numbness, or a faint feeling.  You have severe chest or abdominal pain, vomiting, or breathing problems. MAKE SURE YOU:   Understand these instructions.  Will watch your condition.  Will get help right away if you are not doing well or get worse. Document Released: 10/02/2005 Document Revised: 12/25/2011 Document Reviewed: 05/22/2008 [employer] Patient Information 2013 [employer].  

## 2012-09-04 NOTE — Assessment & Plan Note (Signed)

## 2012-09-04 NOTE — Assessment & Plan Note (Signed)
His BP is not well controlled so I have added Bystolic to the HCTZ, today I will check his lytes and renal function

## 2012-10-17 ENCOUNTER — Ambulatory Visit (INDEPENDENT_AMBULATORY_CARE_PROVIDER_SITE_OTHER): Payer: Medicare Other | Admitting: Internal Medicine

## 2012-10-17 ENCOUNTER — Encounter: Payer: Self-pay | Admitting: Internal Medicine

## 2012-10-17 VITALS — BP 128/82 | HR 81 | Temp 98.4°F | Resp 16 | Wt 218.0 lb

## 2012-10-17 DIAGNOSIS — E782 Mixed hyperlipidemia: Secondary | ICD-10-CM

## 2012-10-17 DIAGNOSIS — I10 Essential (primary) hypertension: Secondary | ICD-10-CM

## 2012-10-17 NOTE — Progress Notes (Signed)
  Subjective:    Patient ID: [name], male    DOB: 11-03-1955, 57 y.o.   MRN: [id number]  Hypertension This is a chronic problem. The current episode started more than 1 year ago. The problem has been gradually improving since onset. The problem is controlled. Pertinent negatives include no anxiety, blurred vision, chest pain, headaches, malaise/fatigue, neck pain, orthopnea, palpitations, peripheral edema, PND, shortness of breath or sweats. Past treatments include beta blockers and diuretics. The current treatment provides significant improvement. Compliance problems include exercise and diet.       Review of Systems  Constitutional: Negative.  Negative for malaise/fatigue.  HENT: Negative.  Negative for neck pain.   Eyes: Negative.  Negative for blurred vision.  Respiratory: Negative.  Negative for shortness of breath.   Cardiovascular: Negative.  Negative for chest pain, palpitations, orthopnea and PND.  Gastrointestinal: Negative.   Genitourinary: Negative.   Musculoskeletal: Negative.   Skin: Negative.   Neurological: Negative.  Negative for headaches.  Hematological: Negative.   Psychiatric/Behavioral: Negative.        Objective:   Physical Exam  Vitals reviewed. Constitutional: He is oriented to person, place, and time. He appears well-developed and well-nourished. No distress.  HENT:  Head: Normocephalic and atraumatic.  Mouth/Throat: Oropharynx is clear and moist. No oropharyngeal exudate.  Eyes: Conjunctivae normal are normal. Right eye exhibits no discharge. Left eye exhibits no discharge. No scleral icterus.  Neck: Normal range of motion. Neck supple. No JVD present. No tracheal deviation present. No thyromegaly present.  Cardiovascular: Normal rate, regular rhythm, normal heart sounds and intact distal pulses.  Exam reveals no gallop and no friction rub.   No murmur heard. Pulmonary/Chest: Effort normal and breath sounds normal. No stridor. No respiratory  distress. He has no wheezes. He has no rales. He exhibits no tenderness.  Abdominal: Soft. Bowel sounds are normal. He exhibits no distension and no mass. There is no tenderness. There is no rebound and no guarding.  Musculoskeletal: Normal range of motion. He exhibits no edema and no tenderness.  Lymphadenopathy:    He has no cervical adenopathy.  Neurological: He is oriented to person, place, and time.  Skin: Skin is warm and dry. No rash noted. He is not diaphoretic. No erythema. No pallor.  Psychiatric: He has a normal mood and affect. His behavior is normal. Judgment and thought content normal.      Lab Results  Component Value Date   WBC 9.2 09/04/2012   HGB 15.5 09/04/2012   HCT 45.5 09/04/2012   PLT 239.0 09/04/2012   GLUCOSE 117* 09/04/2012   CHOL 240* 11/02/2011   TRIG 180.0* 11/02/2011   HDL 37.80* 11/02/2011   LDLDIRECT 163.2 11/02/2011   ALT 32 09/04/2012   AST 22 09/04/2012   NA 137 09/04/2012   K 4.0 09/04/2012   CL 104 09/04/2012   CREATININE 1.1 09/04/2012   BUN 14 09/04/2012   CO2 25 09/04/2012   TSH 1.30 09/04/2012   PSA 0.52 09/04/2012   INR 1.2 08/04/2008   HGBA1C 5.6 05/01/2012   MICROALBUR 0.2 05/01/2012      Assessment & Plan:

## 2012-10-17 NOTE — Assessment & Plan Note (Signed)
His BP is well controlled 

## 2012-10-17 NOTE — Assessment & Plan Note (Signed)
He will not take a statin 

## 2012-10-17 NOTE — Patient Instructions (Signed)

## 2013-02-05 ENCOUNTER — Ambulatory Visit (INDEPENDENT_AMBULATORY_CARE_PROVIDER_SITE_OTHER): Payer: Medicare Other | Admitting: Pulmonary Disease

## 2013-02-05 ENCOUNTER — Encounter: Payer: Self-pay | Admitting: Pulmonary Disease

## 2013-02-05 VITALS — BP 108/72 | HR 100 | Temp 97.9°F | Ht 70.0 in | Wt 218.0 lb

## 2013-02-05 DIAGNOSIS — J8410 Pulmonary fibrosis, unspecified: Secondary | ICD-10-CM

## 2013-02-05 DIAGNOSIS — J438 Other emphysema: Secondary | ICD-10-CM

## 2013-02-05 DIAGNOSIS — R0902 Hypoxemia: Secondary | ICD-10-CM

## 2013-02-05 MED ORDER — MOMETASONE FUROATE 50 MCG/ACT NA SUSP: 2.0000 | Freq: Every day | NASAL | Status: DC

## 2013-02-05 MED ORDER — IPRATROPIUM-ALBUTEROL 20-100 MCG/ACT IN AERS: 1.0000 | INHALATION_SPRAY | Freq: Four times a day (QID) | RESPIRATORY_TRACT | Status: DC | PRN

## 2013-02-05 NOTE — Assessment & Plan Note (Signed)
He has required supplemental oxygen with exertion and sleep since he was in hospital in 2009 with steroid responsive pneumonitis.     

## 2013-02-05 NOTE — Assessment & Plan Note (Signed)
Had steroid responsive pneumonitis in 2009.  He has diffusion defect on PFT, but this has been stable.  Recent chest xrays have been stable also.  Monitor clinically.

## 2013-02-05 NOTE — Assessment & Plan Note (Signed)
Have given him sample of nasonex.  He can try either claritin or zyrtec in place of allegra.

## 2013-02-05 NOTE — Assessment & Plan Note (Signed)
Stable on spirometry.  Will continue prn combivent.

## 2013-02-05 NOTE — Patient Instructions (Signed)
Nasonex two sprays each nostril daily until sample finished Can try using claritin or zyrtec in place of allegra (fexofenidine) for allergies Follow up in 1 year

## 2013-02-05 NOTE — Addendum Note (Signed)
Addended by: Tommie Sams on: 02/05/2013 03:38 PM   Modules accepted: Orders

## 2013-02-05 NOTE — Progress Notes (Signed)
Chief Complaint  Patient presents with  . Follow-up    Pt reports he has his good and bad days with his breathing. Since pollen started falling it has been a little more short. Pt states he is sore in his ribs. Pt c/o slight dry cough, PND, clears his throat, itchy eyes d/t allergies. Pt is taking fexofenadine but not helping.     History of Present Illness: [name] is a 57 y.o. male former smoker with Emphysema and exertional/nocturnal hypoxemia.  Has hx of steroid responsive pneumonitis in 2009.  He has noticed more cough and sputum over the past few weeks.  He feels this is coming from his sinuses and then draining into his throat.  He has tried fexofenadine for his allergies, but this has not helped.  He has noticed occasional wheezing.  He has been getting sore in his sides when coughing.  He denies fever.  He does not need to use combivent much, but this helps when he uses it.  He is still using oxygen with exertion, but not so much at night.   TESTS: A1AT 08/12/08 >> A1AT 263 PFT 09/23/08 >> FEV1 2.89 (84%), FEV1% 78, TLC 5.28 (78%), DLCO 64% PFT 08/23/11 >> FEV1 3.68(110%), FEV1% 75, TLC 5.38(80%), DLCO 60%, no BD Spirometry 02/05/13 >> FEV1 3.62 (97%), FEV1% 79  [name]  has a past medical history of GERD (gastroesophageal reflux disease); Depression; ILD (interstitial lung disease); Panic attack; Dyslipidemia; Hypertension; Dyslipidemia; COPD (chronic obstructive pulmonary disease) (09/23/08); and History of skin cancer.  [name]  has past surgical history that includes Hernia repair; Left shoulder surgery; Ganglion cyst on foot; and Bronchoscopy (10/09).  Prior to Admission medications   Medication Sig Start Date End Date Taking? Authorizing Provider  albuterol-ipratropium (COMBIVENT) 18-103 MCG/ACT inhaler Inhale 2 puffs into the lungs as needed. 08/08/11  Yes Coralyn Helling, MD  aspirin 81 MG EC tablet Take 81 mg by mouth daily.     Yes Historical Provider, MD   fexofenadine (ALLEGRA) 180 MG tablet Take 180 mg by mouth daily.     Yes Historical Provider, MD  hydrochlorothiazide (HYDRODIURIL) 25 MG tablet Take 1 tablet (25 mg total) by mouth daily. 09/04/12  Yes Etta Grandchild, MD  ibuprofen (ADVIL,MOTRIN) 200 MG tablet Take 200 mg by mouth every 6 (six) hours as needed.     Yes Historical Provider, MD  nebivolol (BYSTOLIC) 5 MG tablet Take 1 tablet (5 mg total) by mouth daily. 09/04/12  Yes Etta Grandchild, MD  NON FORMULARY Oxygen continuous 1.5 liters with activity    Yes Historical Provider, MD    Allergies  Allergen Reactions  . Latex   . Morphine   . Statins     REACTION: weakness, muscle pains     Physical Exam:  General - No distress ENT - No sinus tenderness, no oral exudate, no LAN Cardiac - s1s2 regular, no murmur Chest - No wheeze/rales/dullness Back - No focal tenderness Abd - Soft, non-tender Ext - No edema Neuro - Normal strength Skin - No rashes Psych - normal mood, and behavior   Assessment/Plan:  Coralyn Helling, MD [place] Pager:  [phone]

## 2013-02-17 ENCOUNTER — Encounter: Payer: Self-pay | Admitting: Internal Medicine

## 2013-02-17 ENCOUNTER — Ambulatory Visit (INDEPENDENT_AMBULATORY_CARE_PROVIDER_SITE_OTHER): Payer: Medicare Other

## 2013-02-17 ENCOUNTER — Ambulatory Visit (INDEPENDENT_AMBULATORY_CARE_PROVIDER_SITE_OTHER): Payer: Medicare Other | Admitting: Internal Medicine

## 2013-02-17 VITALS — BP 118/76 | HR 79 | Temp 98.3°F | Resp 16 | Wt 216.0 lb

## 2013-02-17 DIAGNOSIS — I10 Essential (primary) hypertension: Secondary | ICD-10-CM

## 2013-02-17 DIAGNOSIS — E782 Mixed hyperlipidemia: Secondary | ICD-10-CM

## 2013-02-17 NOTE — Progress Notes (Signed)
Subjective:    Patient ID: [name], male    DOB: 1956-06-05, 57 y.o.   MRN: [id number]  Hypertension This is a chronic problem. The current episode started more than 1 year ago. The problem is unchanged. The problem is controlled. Associated symptoms include malaise/fatigue. Pertinent negatives include no anxiety, blurred vision, chest pain, headaches, neck pain, orthopnea, palpitations, peripheral edema, PND, shortness of breath or sweats. Agents associated with hypertension include NSAIDs. Past treatments include beta blockers and diuretics. The current treatment provides significant improvement. Compliance problems include exercise and diet.       Review of Systems  Constitutional: Positive for malaise/fatigue. Negative for fever, chills, diaphoresis, activity change, appetite change, fatigue and unexpected weight change.  HENT: Negative.  Negative for neck pain.   Eyes: Negative.  Negative for blurred vision.  Respiratory: Negative.  Negative for cough, chest tightness, shortness of breath, wheezing and stridor.   Cardiovascular: Negative.  Negative for chest pain, palpitations, orthopnea, leg swelling and PND.  Gastrointestinal: Negative.  Negative for nausea, vomiting, abdominal pain, diarrhea, constipation and blood in stool.  Endocrine: Negative.   Genitourinary: Negative.   Musculoskeletal: Negative.  Negative for myalgias, back pain, joint swelling, arthralgias and gait problem.  Skin: Negative.  Negative for color change, pallor, rash and wound.  Allergic/Immunologic: Negative.   Neurological: Negative.  Negative for dizziness, speech difficulty, weakness, light-headedness and headaches.  Hematological: Negative.  Negative for adenopathy. Does not bruise/bleed easily.  Psychiatric/Behavioral: Negative.        Objective:   Physical Exam  Vitals reviewed. Constitutional: He is oriented to person, place, and time. He appears well-developed and well-nourished.  Non-toxic  appearance. He has a sickly appearance (on continuous oxygen). He does not appear ill. No distress.  HENT:  Head: Normocephalic and atraumatic.  Mouth/Throat: Oropharynx is clear and moist. No oropharyngeal exudate.  Eyes: Conjunctivae are normal. Right eye exhibits no discharge. Left eye exhibits no discharge. No scleral icterus.  Neck: Normal range of motion. Neck supple. No JVD present. No tracheal deviation present. No thyromegaly present.  Cardiovascular: Normal rate, regular rhythm, normal heart sounds and intact distal pulses.  Exam reveals no gallop and no friction rub.   No murmur heard. Pulmonary/Chest: Effort normal and breath sounds normal. No stridor. No respiratory distress. He has no wheezes. He has no rales. He exhibits no tenderness.  Abdominal: Soft. Bowel sounds are normal. He exhibits no distension and no mass. There is no tenderness. There is no rebound and no guarding.  Musculoskeletal: Normal range of motion. He exhibits no edema and no tenderness.  Lymphadenopathy:    He has no cervical adenopathy.  Neurological: He is oriented to person, place, and time.  Skin: Skin is warm and dry. No rash noted. He is not diaphoretic. No erythema. No pallor.  Psychiatric: He has a normal mood and affect. His behavior is normal. Judgment and thought content normal.     Lab Results  Component Value Date   WBC 9.2 09/04/2012   HGB 15.5 09/04/2012   HCT 45.5 09/04/2012   PLT 239.0 09/04/2012   GLUCOSE 117* 09/04/2012   CHOL 240* 11/02/2011   TRIG 180.0* 11/02/2011   HDL 37.80* 11/02/2011   LDLDIRECT 163.2 11/02/2011   ALT 32 09/04/2012   AST 22 09/04/2012   NA 137 09/04/2012   K 4.0 09/04/2012   CL 104 09/04/2012   CREATININE 1.1 09/04/2012   BUN 14 09/04/2012   CO2 25 09/04/2012   TSH 1.30 09/04/2012  PSA 0.52 09/04/2012   INR 1.2 08/04/2008   HGBA1C 5.6 05/01/2012   MICROALBUR 0.2 05/01/2012       Assessment & Plan:

## 2013-02-17 NOTE — Assessment & Plan Note (Signed)
I will recheck his FLP and will treat if needed

## 2013-02-17 NOTE — Assessment & Plan Note (Signed)
His BP is well controlled Today I will check his lytes and renal function 

## 2013-02-17 NOTE — Patient Instructions (Signed)

## 2013-02-18 LAB — COMPREHENSIVE METABOLIC PANEL
ALT: 32 U/L (ref 0–53)
AST: 21 U/L (ref 0–37)
Alkaline Phosphatase: 71 U/L (ref 39–117)
BUN: 19 mg/dL (ref 6–23)
Calcium: 9.7 mg/dL (ref 8.4–10.5)
Chloride: 106 mEq/L (ref 96–112)
Creatinine, Ser: 1 mg/dL (ref 0.4–1.5)
Potassium: 3.8 mEq/L (ref 3.5–5.1)

## 2013-02-18 LAB — TSH: TSH: 1.61 u[IU]/mL (ref 0.35–5.50)

## 2013-02-18 LAB — LIPID PANEL
HDL: 33.4 mg/dL — ABNORMAL LOW (ref >39.00)
Total CHOL/HDL Ratio: 9
Triglycerides: 390 mg/dL — ABNORMAL HIGH (ref 0.0–149.0)

## 2013-02-19 ENCOUNTER — Encounter: Payer: Self-pay | Admitting: Internal Medicine

## 2013-02-19 LAB — LDL CHOLESTEROL, DIRECT: Direct LDL: 185.1 mg/dL

## 2013-06-25 ENCOUNTER — Ambulatory Visit (INDEPENDENT_AMBULATORY_CARE_PROVIDER_SITE_OTHER): Payer: Medicare Other | Admitting: Internal Medicine

## 2013-06-25 ENCOUNTER — Encounter: Payer: Self-pay | Admitting: Internal Medicine

## 2013-06-25 ENCOUNTER — Ambulatory Visit (INDEPENDENT_AMBULATORY_CARE_PROVIDER_SITE_OTHER): Payer: Medicare Other

## 2013-06-25 VITALS — BP 126/84 | HR 110 | Temp 98.6°F | Resp 24 | Wt 208.0 lb

## 2013-06-25 DIAGNOSIS — H6011 Cellulitis of right external ear: Secondary | ICD-10-CM

## 2013-06-25 DIAGNOSIS — I10 Essential (primary) hypertension: Secondary | ICD-10-CM

## 2013-06-25 DIAGNOSIS — H60399 Other infective otitis externa, unspecified ear: Secondary | ICD-10-CM

## 2013-06-25 DIAGNOSIS — E782 Mixed hyperlipidemia: Secondary | ICD-10-CM

## 2013-06-25 DIAGNOSIS — H6010 Cellulitis of external ear, unspecified ear: Secondary | ICD-10-CM | POA: Insufficient documentation

## 2013-06-25 DIAGNOSIS — J309 Allergic rhinitis, unspecified: Secondary | ICD-10-CM

## 2013-06-25 DIAGNOSIS — Z23 Encounter for immunization: Secondary | ICD-10-CM

## 2013-06-25 LAB — COMPREHENSIVE METABOLIC PANEL
Albumin: 4.3 g/dL (ref 3.5–5.2)
BUN: 13 mg/dL (ref 6–23)
CO2: 26 mEq/L (ref 19–32)
Calcium: 9.5 mg/dL (ref 8.4–10.5)
Chloride: 102 mEq/L (ref 96–112)
Creatinine, Ser: 1 mg/dL (ref 0.4–1.5)
GFR: 85.06 mL/min (ref >60.00)
Glucose, Bld: 114 mg/dL — ABNORMAL HIGH (ref 70–99)
Potassium: 3.5 mEq/L (ref 3.5–5.1)

## 2013-06-25 LAB — LIPID PANEL
Cholesterol: 263 mg/dL — ABNORMAL HIGH (ref 0–200)
VLDL: 42 mg/dL — ABNORMAL HIGH (ref 0.0–40.0)

## 2013-06-25 MED ORDER — CEFUROXIME AXETIL 500 MG PO TABS: 500.0000 mg | ORAL_TABLET | Freq: Two times a day (BID) | ORAL | Status: DC

## 2013-06-25 MED ORDER — MOMETASONE FUROATE 50 MCG/ACT NA SUSP: 4.0000 | Freq: Every day | NASAL | Status: DC

## 2013-06-25 MED ORDER — MUPIROCIN 2 % EX OINT: TOPICAL_OINTMENT | Freq: Three times a day (TID) | CUTANEOUS | Status: DC

## 2013-06-25 NOTE — Progress Notes (Signed)
Subjective:    Patient ID: [name], male    DOB: 01-18-1956, 57 y.o.   MRN: [id number]  HPI Comments: He returns today to f/up on his lipids but he also complains that a bug bit his right ear lobe about three weeks ago and since then he has had some sores on the area that keep draining yellow pus. There is mild pain and swelling as well.  Hyperlipidemia This is a chronic problem. The problem is resistant. Recent lipid tests were reviewed and are variable. Exacerbating diseases include obesity. He has no history of chronic renal disease, diabetes, hypothyroidism, liver disease or nephrotic syndrome. Factors aggravating his hyperlipidemia include fatty foods. Associated symptoms include shortness of breath. Pertinent negatives include no chest pain, focal sensory loss, focal weakness, leg pain or myalgias. Current antihyperlipidemic treatment includes diet change. The current treatment provides no improvement of lipids.      Review of Systems  Constitutional: Negative for fever, chills, diaphoresis, appetite change and fatigue.  HENT: Negative.   Eyes: Negative.   Respiratory: Positive for shortness of breath. Negative for apnea, cough, choking, chest tightness, wheezing and stridor.   Cardiovascular: Negative.  Negative for chest pain, palpitations and leg swelling.  Gastrointestinal: Negative.  Negative for nausea, vomiting, abdominal pain, diarrhea and constipation.  Endocrine: Negative.   Genitourinary: Negative.   Musculoskeletal: Negative.  Negative for myalgias.  Skin: Negative.   Allergic/Immunologic: Negative.   Neurological: Negative.  Negative for dizziness, tremors, focal weakness, speech difficulty, weakness, light-headedness, numbness and headaches.  Hematological: Negative.  Negative for adenopathy. Does not bruise/bleed easily.  Psychiatric/Behavioral: Negative.        Objective:   Physical Exam  Vitals reviewed. Constitutional: He appears well-developed and  well-nourished. No distress.  HENT:  Head: Normocephalic and atraumatic.  Right Ear: Hearing, tympanic membrane and ear canal normal. There is drainage.  Left Ear: Hearing, tympanic membrane, external ear and ear canal normal.  Ears:  Mouth/Throat: No oropharyngeal exudate.  Eyes: Conjunctivae are normal. Right eye exhibits no discharge. Left eye exhibits no discharge. No scleral icterus.  Neck: Normal range of motion. Neck supple. No JVD present. No tracheal deviation present. No thyromegaly present.  Cardiovascular: Normal rate, regular rhythm, normal heart sounds and intact distal pulses.  Exam reveals no gallop and no friction rub.   No murmur heard. Pulmonary/Chest: Effort normal and breath sounds normal. No stridor. No respiratory distress. He has no wheezes. He has no rales. He exhibits no tenderness.  Abdominal: Soft. Bowel sounds are normal. He exhibits no distension and no mass. There is no tenderness. There is no rebound and no guarding.  Musculoskeletal: Normal range of motion. He exhibits no edema and no tenderness.  Lymphadenopathy:    He has no cervical adenopathy.  Skin: Skin is warm and dry. No rash noted. He is not diaphoretic. No erythema. No pallor.  Psychiatric: He has a normal mood and affect. His behavior is normal. Judgment and thought content normal.     Lab Results  Component Value Date   WBC 9.2 09/04/2012   HGB 15.5 09/04/2012   HCT 45.5 09/04/2012   PLT 239.0 09/04/2012   GLUCOSE 108* 02/17/2013   CHOL 297* 02/17/2013   TRIG 390.0* 02/17/2013   HDL 33.40* 02/17/2013   LDLDIRECT 185.1 02/17/2013   ALT 32 02/17/2013   AST 21 02/17/2013   NA 138 02/17/2013   K 3.8 02/17/2013   CL 106 02/17/2013   CREATININE 1.0 02/17/2013   BUN 19 02/17/2013  CO2 22 02/17/2013   TSH 1.61 02/17/2013   PSA 0.52 09/04/2012   INR 1.2 08/04/2008   HGBA1C 5.6 05/01/2012   MICROALBUR 0.2 05/01/2012       Assessment & Plan:

## 2013-06-25 NOTE — Assessment & Plan Note (Signed)
This looks like impetigo - will treat with ceftin and bactroban

## 2013-06-25 NOTE — Assessment & Plan Note (Signed)
His BP is well controlled Today I will check his lytes and renal function 

## 2013-06-25 NOTE — Assessment & Plan Note (Signed)
He is working on his lifestyle modifications Will recheck his FLP today He will not take a statin

## 2013-06-25 NOTE — Patient Instructions (Signed)
Cellulitis Cellulitis is an infection of the skin and the tissue beneath it. The infected area is usually red and tender. Cellulitis occurs most often in the arms and lower legs.  CAUSES  Cellulitis is caused by bacteria that enter the skin through cracks or cuts in the skin. The most common types of bacteria that cause cellulitis are Staphylococcus and Streptococcus. SYMPTOMS   Redness and warmth.  Swelling.  Tenderness or pain.  Fever. DIAGNOSIS  Your caregiver can usually determine what is wrong based on a physical exam. Blood tests may also be done. TREATMENT  Treatment usually involves taking an antibiotic medicine. HOME CARE INSTRUCTIONS   Take your antibiotics as directed. Finish them even if you start to feel better.  Keep the infected arm or leg elevated to reduce swelling.  Apply a warm cloth to the affected area up to 4 times per day to relieve pain.  Only take over-the-counter or prescription medicines for pain, discomfort, or fever as directed by your caregiver.  Keep all follow-up appointments as directed by your caregiver. SEEK MEDICAL CARE IF:   You notice red streaks coming from the infected area.  Your red area gets larger or turns dark in color.  Your bone or joint underneath the infected area becomes painful after the skin has healed.  Your infection returns in the same area or another area.  You notice a swollen bump in the infected area.  You develop new symptoms. SEEK IMMEDIATE MEDICAL CARE IF:   You have a fever.  You feel very sleepy.  You develop vomiting or diarrhea.  You have a general ill feeling (malaise) with muscle aches and pains. MAKE SURE YOU:   Understand these instructions.  Will watch your condition.  Will get help right away if you are not doing well or get worse. Document Released: 07/12/2005 Document Revised: 04/02/2012 Document Reviewed: 12/18/2011 [employer] Patient Information 2014 [employer].  

## 2013-07-09 ENCOUNTER — Encounter: Payer: Self-pay | Admitting: Internal Medicine

## 2013-07-09 ENCOUNTER — Ambulatory Visit (INDEPENDENT_AMBULATORY_CARE_PROVIDER_SITE_OTHER): Payer: Medicare Other | Admitting: Internal Medicine

## 2013-07-09 VITALS — BP 116/78 | HR 104 | Temp 98.6°F | Resp 16 | Wt 209.8 lb

## 2013-07-09 DIAGNOSIS — I10 Essential (primary) hypertension: Secondary | ICD-10-CM

## 2013-07-09 DIAGNOSIS — E785 Hyperlipidemia, unspecified: Secondary | ICD-10-CM

## 2013-07-09 DIAGNOSIS — H60399 Other infective otitis externa, unspecified ear: Secondary | ICD-10-CM

## 2013-07-09 DIAGNOSIS — E781 Pure hyperglyceridemia: Secondary | ICD-10-CM | POA: Insufficient documentation

## 2013-07-09 DIAGNOSIS — H6011 Cellulitis of right external ear: Secondary | ICD-10-CM

## 2013-07-09 MED ORDER — COLESEVELAM HCL 3.75 G PO PACK: 1.0000 | PACK | Freq: Every day | ORAL | Status: DC

## 2013-07-09 NOTE — Assessment & Plan Note (Signed)
He will try welchol

## 2013-07-09 NOTE — Assessment & Plan Note (Signed)
This has resolved.

## 2013-07-09 NOTE — Assessment & Plan Note (Signed)
His BP is well controlled 

## 2013-07-09 NOTE — Patient Instructions (Signed)

## 2013-07-09 NOTE — Assessment & Plan Note (Signed)
He will reduce his caloric intake and will lose weight

## 2013-07-09 NOTE — Progress Notes (Signed)
  Subjective:    Patient ID: [name], male    DOB: 02-Mar-1956, 57 y.o.   MRN: [id number]  Wound Check He was originally treated 10 to 14 days ago. Previous treatment included oral antibiotics. His temperature was unmeasured prior to arrival. There has been no drainage from the wound. There is no redness present. There is no swelling present. The pain has no pain. He has no difficulty moving the affected extremity or digit.      Review of Systems  Constitutional: Negative.  Negative for fever, chills, diaphoresis, appetite change and fatigue.  HENT: Negative.   Eyes: Negative.   Respiratory: Positive for shortness of breath. Negative for cough, choking, chest tightness and stridor.   Cardiovascular: Negative.  Negative for chest pain, palpitations and leg swelling.  Gastrointestinal: Negative.  Negative for nausea, vomiting, abdominal pain, diarrhea, constipation and blood in stool.  Endocrine: Negative.   Genitourinary: Negative.   Musculoskeletal: Negative.  Negative for myalgias, back pain, joint swelling, arthralgias and gait problem.  Skin: Negative.   Allergic/Immunologic: Negative.   Neurological: Negative.  Negative for dizziness, tremors, weakness, light-headedness, numbness and headaches.  Hematological: Negative.  Negative for adenopathy. Does not bruise/bleed easily.  Psychiatric/Behavioral: Negative.        Objective:   Physical Exam  Vitals reviewed. Constitutional: He is oriented to person, place, and time. He appears well-developed and well-nourished. No distress.  HENT:  Head: Normocephalic and atraumatic.  Ears:  Mouth/Throat: Oropharynx is clear and moist. No oropharyngeal exudate.  Eyes: Conjunctivae and EOM are normal. Right eye exhibits no discharge. Left eye exhibits no discharge. No scleral icterus.  Neck: Normal range of motion. Neck supple. No JVD present. No tracheal deviation present. No thyromegaly present.  Cardiovascular: Normal rate, regular  rhythm, normal heart sounds and intact distal pulses.  Exam reveals no gallop and no friction rub.   No murmur heard. Pulmonary/Chest: Breath sounds normal. No accessory muscle usage or stridor. Tachypnea noted. No respiratory distress. He has no decreased breath sounds. He has no wheezes. He has no rhonchi. He has no rales. He exhibits no tenderness.  Abdominal: Soft. Bowel sounds are normal. He exhibits no distension and no mass. There is no tenderness. There is no rebound and no guarding.  Musculoskeletal: Normal range of motion. He exhibits no edema and no tenderness.  Lymphadenopathy:    He has no cervical adenopathy.  Neurological: He is oriented to person, place, and time.  Skin: Skin is warm and dry. No rash noted. He is not diaphoretic. No erythema. No pallor.  Psychiatric: He has a normal mood and affect. His behavior is normal. Judgment and thought content normal.      Lab Results  Component Value Date   WBC 9.2 09/04/2012   HGB 15.5 09/04/2012   HCT 45.5 09/04/2012   PLT 239.0 09/04/2012   GLUCOSE 114* 06/25/2013   CHOL 263* 06/25/2013   TRIG 210.0* 06/25/2013   HDL 33.10* 06/25/2013   LDLDIRECT 189.3 06/25/2013   ALT 31 06/25/2013   AST 22 06/25/2013   NA 137 06/25/2013   K 3.5 06/25/2013   CL 102 06/25/2013   CREATININE 1.0 06/25/2013   BUN 13 06/25/2013   CO2 26 06/25/2013   TSH 1.61 02/17/2013   PSA 0.52 09/04/2012   INR 1.2 08/04/2008   HGBA1C 5.6 05/01/2012   MICROALBUR 0.2 05/01/2012      Assessment & Plan:

## 2013-07-22 ENCOUNTER — Other Ambulatory Visit: Payer: Self-pay | Admitting: Internal Medicine

## 2013-07-24 ENCOUNTER — Other Ambulatory Visit: Payer: Self-pay | Admitting: Internal Medicine

## 2013-10-03 ENCOUNTER — Telehealth: Payer: Self-pay

## 2013-10-03 MED ORDER — FLUNISOLIDE 29 MCG/ACT NA SOLN: 2.0000 | Freq: Two times a day (BID) | NASAL | Status: DC

## 2013-10-03 NOTE — Telephone Encounter (Signed)
Revcieved fax from insurance stating that they will no longer pay for Nasonex effective 10/16/13, alternatives are fluisolide, and fluticasone. Per MD ok to switch to flunisolide

## 2013-11-12 ENCOUNTER — Other Ambulatory Visit (INDEPENDENT_AMBULATORY_CARE_PROVIDER_SITE_OTHER): Payer: Medicare Other

## 2013-11-12 ENCOUNTER — Ambulatory Visit (INDEPENDENT_AMBULATORY_CARE_PROVIDER_SITE_OTHER): Payer: Medicare Other | Admitting: Internal Medicine

## 2013-11-12 ENCOUNTER — Encounter: Payer: Self-pay | Admitting: Internal Medicine

## 2013-11-12 VITALS — BP 136/88 | HR 88 | Temp 98.1°F | Resp 16 | Ht 70.0 in | Wt 208.0 lb

## 2013-11-12 DIAGNOSIS — I10 Essential (primary) hypertension: Secondary | ICD-10-CM

## 2013-11-12 DIAGNOSIS — R7309 Other abnormal glucose: Secondary | ICD-10-CM

## 2013-11-12 DIAGNOSIS — E118 Type 2 diabetes mellitus with unspecified complications: Secondary | ICD-10-CM | POA: Insufficient documentation

## 2013-11-12 LAB — BASIC METABOLIC PANEL
BUN: 15 mg/dL (ref 6–23)
CALCIUM: 9.2 mg/dL (ref 8.4–10.5)
CHLORIDE: 104 meq/L (ref 96–112)
CO2: 26 mEq/L (ref 19–32)
CREATININE: 1 mg/dL (ref 0.4–1.5)
GFR: 81.99 mL/min (ref >60.00)
Glucose, Bld: 124 mg/dL — ABNORMAL HIGH (ref 70–99)
Potassium: 3.9 mEq/L (ref 3.5–5.1)
Sodium: 137 mEq/L (ref 135–145)

## 2013-11-12 LAB — HEMOGLOBIN A1C: HEMOGLOBIN A1C: 6 % (ref 4.6–6.5)

## 2013-11-12 NOTE — Patient Instructions (Signed)

## 2013-11-12 NOTE — Assessment & Plan Note (Signed)
He has pre-diabetes 

## 2013-11-12 NOTE — Progress Notes (Signed)
Pre visit review using our clinic review tool, if applicable. No additional management support is needed unless otherwise documented below in the visit note. 

## 2013-11-12 NOTE — Progress Notes (Signed)
   Subjective:    Patient ID: [name], male    DOB: 08/07/1956, 58 y.o.   MRN: [id number]  Hypertension This is a chronic problem. The current episode started more than 1 year ago. The problem is unchanged. The problem is controlled. Associated symptoms include shortness of breath. Pertinent negatives include no anxiety, blurred vision, chest pain, headaches, malaise/fatigue, neck pain, orthopnea, palpitations, peripheral edema, PND or sweats. Past treatments include diuretics. The current treatment provides mild improvement. Compliance problems include exercise and diet.       Review of Systems  Constitutional: Negative.  Negative for fever, chills, malaise/fatigue, diaphoresis, appetite change and fatigue.  HENT: Negative.   Eyes: Negative.  Negative for blurred vision.  Respiratory: Positive for shortness of breath. Negative for apnea, cough, choking, chest tightness and stridor.   Cardiovascular: Negative.  Negative for chest pain, palpitations, orthopnea, leg swelling and PND.  Gastrointestinal: Negative.  Negative for nausea, vomiting, abdominal pain, diarrhea, constipation and blood in stool.  Endocrine: Negative.   Genitourinary: Negative.   Musculoskeletal: Negative.  Negative for neck pain.  Skin: Negative.   Allergic/Immunologic: Negative.   Neurological: Negative.  Negative for headaches.  Hematological: Negative.  Negative for adenopathy. Does not bruise/bleed easily.  Psychiatric/Behavioral: Negative.        Objective:   Physical Exam  Vitals reviewed. Constitutional: He is oriented to person, place, and time. He appears well-developed and well-nourished. No distress.  HENT:  Head: Normocephalic and atraumatic.  Mouth/Throat: Oropharynx is clear and moist. No oropharyngeal exudate.  Eyes: Conjunctivae are normal. Right eye exhibits no discharge. Left eye exhibits no discharge. No scleral icterus.  Neck: Normal range of motion. Neck supple. No JVD present. No  tracheal deviation present. No thyromegaly present.  Cardiovascular: Normal rate, regular rhythm, normal heart sounds and intact distal pulses.  Exam reveals no gallop and no friction rub.   No murmur heard. Pulmonary/Chest: Effort normal and breath sounds normal. No stridor. No respiratory distress. He has no wheezes. He has no rales. He exhibits no tenderness.  Abdominal: Soft. Bowel sounds are normal. He exhibits no distension and no mass. There is no tenderness. There is no rebound and no guarding.  Musculoskeletal: Normal range of motion. He exhibits no edema and no tenderness.  Lymphadenopathy:    He has no cervical adenopathy.  Neurological: He is oriented to person, place, and time.  Skin: Skin is warm and dry. No rash noted. He is not diaphoretic. No erythema. No pallor.  Psychiatric: He has a normal mood and affect. His behavior is normal. Judgment and thought content normal.     Lab Results  Component Value Date   WBC 9.2 09/04/2012   HGB 15.5 09/04/2012   HCT 45.5 09/04/2012   PLT 239.0 09/04/2012   GLUCOSE 114* 06/25/2013   CHOL 263* 06/25/2013   TRIG 210.0* 06/25/2013   HDL 33.10* 06/25/2013   LDLDIRECT 189.3 06/25/2013   ALT 31 06/25/2013   AST 22 06/25/2013   NA 137 06/25/2013   K 3.5 06/25/2013   CL 102 06/25/2013   CREATININE 1.0 06/25/2013   BUN 13 06/25/2013   CO2 26 06/25/2013   TSH 1.61 02/17/2013   PSA 0.52 09/04/2012   INR 1.2 08/04/2008   HGBA1C 5.6 05/01/2012   MICROALBUR 0.2 05/01/2012       Assessment & Plan:

## 2013-11-12 NOTE — Assessment & Plan Note (Signed)
His BP is well controlled Lytes and renal function are stable 

## 2014-01-28 ENCOUNTER — Encounter: Payer: Self-pay | Admitting: Nurse Practitioner

## 2014-01-28 ENCOUNTER — Ambulatory Visit (INDEPENDENT_AMBULATORY_CARE_PROVIDER_SITE_OTHER): Payer: Medicare Other | Admitting: Nurse Practitioner

## 2014-01-28 VITALS — BP 136/90 | HR 73 | Temp 98.1°F | Ht 70.0 in | Wt 222.0 lb

## 2014-01-28 DIAGNOSIS — J441 Chronic obstructive pulmonary disease with (acute) exacerbation: Secondary | ICD-10-CM

## 2014-01-28 MED ORDER — DOXYCYCLINE HYCLATE 100 MG PO TABS: 100.0000 mg | ORAL_TABLET | Freq: Two times a day (BID) | ORAL | Status: DC

## 2014-01-28 NOTE — Patient Instructions (Signed)
Please start antibiotic. Also rinse sinuses daily throughout allergy season with Lloyd HugerNeil med sinus rinse to clear sinuses of pollen and other things you are allergic to. Follow up with pulmonary doctor or call us sooner if you are not feeling better.

## 2014-01-28 NOTE — Progress Notes (Signed)
Pre visit review using our clinic review tool, if applicable. No additional management support is needed unless otherwise documented below in the visit note. 

## 2014-01-28 NOTE — Progress Notes (Signed)
   Subjective:    Patient ID: [name], male    DOB: 05/26/1956, 58 y.o.   MRN: [id number]  Cough This is a recurrent problem. The current episode started in the past 7 days. The problem has been unchanged. The cough is productive of sputum (yellow). Associated symptoms include nasal congestion, postnasal drip, a sore throat and wheezing. Pertinent negatives include no chest pain, chills, ear congestion, fever, headaches or shortness of breath (no more than usual. pt uses oxygen 24h). The symptoms are aggravated by pollens. Treatments tried: nasonex-occasionally due to cost, takes allegra occasionally-doesn't think helps. The treatment provided no relief. His past medical history is significant for COPD and environmental allergies.      Review of Systems  Constitutional: Negative for fever, chills and fatigue.  HENT: Positive for congestion, postnasal drip, sinus pressure and sore throat.   Respiratory: Positive for cough, chest tightness and wheezing. Negative for shortness of breath (no more than usual. pt uses oxygen 24h).   Cardiovascular: Negative for chest pain.  Musculoskeletal: Negative for back pain.  Allergic/Immunologic: Positive for environmental allergies.  Neurological: Negative for headaches.       Objective:   Physical Exam  Vitals reviewed. Constitutional: He is oriented to person, place, and time. He appears well-developed and well-nourished. No distress.  HENT:  Head: Normocephalic and atraumatic.  Right Ear: External ear normal.  Left Ear: External ear normal.  Mouth/Throat: Oropharynx is clear and moist. No oropharyngeal exudate.  Eyes: Conjunctivae are normal. Right eye exhibits no discharge. Left eye exhibits no discharge.  Neck: Normal range of motion. Neck supple. No thyromegaly present.  Cardiovascular: Normal rate, regular rhythm and normal heart sounds.   No murmur heard. Pulmonary/Chest: Effort normal and breath sounds normal. No respiratory  distress. He has no wheezes. He has no rales.  Using 02 by Lequire   Lymphadenopathy:    He has no cervical adenopathy.  Neurological: He is alert and oriented to person, place, and time.  Skin: Skin is warm and dry.  Psychiatric: He has a normal mood and affect. His behavior is normal. Thought content normal.          Assessment & Plan:  1. COPD exacerbation Possibly allergy flare - doxycycline (VIBRA-TABS) 100 MG tablet; Take 1 tablet (100 mg total) by mouth 2 (two) times daily.  Dispense: 14 tablet; Refill: 0 Daily sinus rinse. Use throughout allergy season.

## 2014-02-04 ENCOUNTER — Other Ambulatory Visit: Payer: Self-pay | Admitting: Internal Medicine

## 2014-02-09 ENCOUNTER — Encounter: Payer: Self-pay | Admitting: Pulmonary Disease

## 2014-02-09 ENCOUNTER — Ambulatory Visit (INDEPENDENT_AMBULATORY_CARE_PROVIDER_SITE_OTHER): Payer: Medicare Other | Admitting: Pulmonary Disease

## 2014-02-09 ENCOUNTER — Ambulatory Visit (INDEPENDENT_AMBULATORY_CARE_PROVIDER_SITE_OTHER)
Admission: RE | Admit: 2014-02-09 | Discharge: 2014-02-09 | Disposition: A | Discharge status: Home / Self Care | Payer: Medicare Other | Source: Ambulatory Visit | Attending: Pulmonary Disease | Admitting: Pulmonary Disease

## 2014-02-09 VITALS — BP 126/92 | HR 103 | Ht 70.0 in | Wt 224.6 lb

## 2014-02-09 DIAGNOSIS — R0902 Hypoxemia: Secondary | ICD-10-CM

## 2014-02-09 DIAGNOSIS — R058 Other specified cough: Secondary | ICD-10-CM

## 2014-02-09 DIAGNOSIS — J438 Other emphysema: Secondary | ICD-10-CM

## 2014-02-09 DIAGNOSIS — J8410 Pulmonary fibrosis, unspecified: Secondary | ICD-10-CM

## 2014-02-09 DIAGNOSIS — J849 Interstitial pulmonary disease, unspecified: Secondary | ICD-10-CM

## 2014-02-09 DIAGNOSIS — J439 Emphysema, unspecified: Secondary | ICD-10-CM

## 2014-02-09 DIAGNOSIS — R05 Cough: Secondary | ICD-10-CM

## 2014-02-09 DIAGNOSIS — R059 Cough, unspecified: Secondary | ICD-10-CM

## 2014-02-09 MED ORDER — FLUTICASONE PROPIONATE 50 MCG/ACT NA SUSP: 2.0000 | Freq: Every day | NASAL | Status: DC

## 2014-02-09 NOTE — Assessment & Plan Note (Signed)
Had steroid responsive pneumonitis in 2009.  Will arrange for PFT and CXR to further assess status of this.

## 2014-02-09 NOTE — Assessment & Plan Note (Signed)
He can continue nasal irrigation and try generic flonase.

## 2014-02-09 NOTE — Assessment & Plan Note (Signed)
He is to continue prn combivent >> finances have limited ability for additional therapies.  Will arrange for repeat PFT.

## 2014-02-09 NOTE — Assessment & Plan Note (Signed)
He has required supplemental oxygen with exertion and sleep since he was in hospital in 2009 with steroid responsive pneumonitis.

## 2014-02-09 NOTE — Patient Instructions (Signed)
Chest xray today Will schedule breathing test (PFT) Flonase two sprays each nostril daily as needed for allergies and sinus congestion Follow up in 1 year

## 2014-02-09 NOTE — Progress Notes (Signed)
Chief Complaint  Patient presents with  . Follow-up    Pt c/o breathing has mildly worsened. Pt states he has had 2 episodes of desat since last OV. C/o SOB with moderate activity. C/o intermittent CP with and without activity. Denies cough.     History of Present Illness: [name] is a 58 y.o. male former smoker with Emphysema and exertional/nocturnal hypoxemia.  Has hx of steroid responsive pneumonitis in 2009.  He was given course of doxycycline earlier this month.  He has trouble with sinus congestion and post-nasal drip.  This triggers a cough.  He uses combivent 2 to 3 times per week and this helps.  He continues to use oxygen > he checked his oxygen at home and was low when not using oxygen with activity.  He denies fever, chest pain, skin rash, or hemoptysis.  TESTS: A1AT 08/12/08 >> A1AT 263 PFT 09/23/08 >> FEV1 2.89 (84%), FEV1% 78, TLC 5.28 (78%), DLCO 64% PFT 08/23/11 >> FEV1 3.68(110%), FEV1% 75, TLC 5.38(80%), DLCO 60%, no BD Spirometry 02/05/13 >> FEV1 3.62 (97%), FEV1% 79  [name]  has a past medical history of GERD (gastroesophageal reflux disease); Depression; ILD (interstitial lung disease); Panic attack; Dyslipidemia; Hypertension; Dyslipidemia; COPD (chronic obstructive pulmonary disease) (09/23/08); and History of skin cancer.  [name]  has past surgical history that includes Hernia repair; Left shoulder surgery; Ganglion cyst on foot; and Bronchoscopy (10/09).  Prior to Admission medications   Medication Sig Start Date End Date Taking? Authorizing Provider  albuterol-ipratropium (COMBIVENT) 18-103 MCG/ACT inhaler Inhale 2 puffs into the lungs as needed. 08/08/11  Yes Coralyn HellingVineet Destany Severns, MD  aspirin 81 MG EC tablet Take 81 mg by mouth daily.     Yes Historical Provider, MD  fexofenadine (ALLEGRA) 180 MG tablet Take 180 mg by mouth daily.     Yes Historical Provider, MD  hydrochlorothiazide (HYDRODIURIL) 25 MG tablet Take 1 tablet (25 mg total) by mouth  daily. 09/04/12  Yes Etta Grandchildhomas L Jones, MD  ibuprofen (ADVIL,MOTRIN) 200 MG tablet Take 200 mg by mouth every 6 (six) hours as needed.     Yes Historical Provider, MD  nebivolol (BYSTOLIC) 5 MG tablet Take 1 tablet (5 mg total) by mouth daily. 09/04/12  Yes Etta Grandchildhomas L Jones, MD  NON FORMULARY Oxygen continuous 1.5 liters with activity    Yes Historical Provider, MD    Allergies  Allergen Reactions  . Latex   . Morphine   . Statins     REACTION: weakness, muscle pains     Physical Exam:  General - No distress, wearing oxygen ENT - No sinus tenderness, no oral exudate, no LAN Cardiac - s1s2 regular, no murmur Chest - No wheeze/rales/dullness Back - No focal tenderness Abd - Soft, non-tender Ext - No edema Neuro - Normal strength Skin - No rashes Psych - normal mood, and behavior   Assessment/Plan:  Coralyn HellingVineet Pate Aylward, MD [facility] Pager:  [phone]

## 2014-02-10 ENCOUNTER — Telehealth: Payer: Self-pay | Admitting: Pulmonary Disease

## 2014-02-10 NOTE — Telephone Encounter (Signed)
Dg Chest 2 View  02/09/2014   CLINICAL DATA:  COPD.  Former smoker.  EXAM: CHEST  2 VIEW  COMPARISON:  None.  FINDINGS: The heart size and mediastinal contours are within normal limits. Marked coarsened interstitial markings are identified bilaterally. No superimposed airspace consolidation identified. The visualized skeletal structures are unremarkable.  IMPRESSION: 1. Chronic interstitial lung disease. 2. No acute cardiopulmonary abnormalities.   Electronically Signed   By: Signa Kellaylor  Stroud M.D.   On: 02/09/2014 14:32    Will have my nurse inform pt that CXR shows chronic changes of emphysema and scar tissue similar to old xrays.  No change to current treatment plan.  Will call back once breathing test results available.

## 2014-02-19 NOTE — Telephone Encounter (Signed)
Pt is aware of results. 

## 2014-03-12 ENCOUNTER — Ambulatory Visit (INDEPENDENT_AMBULATORY_CARE_PROVIDER_SITE_OTHER): Payer: Medicare Other | Admitting: Pulmonary Disease

## 2014-03-12 DIAGNOSIS — J438 Other emphysema: Secondary | ICD-10-CM

## 2014-03-12 DIAGNOSIS — J849 Interstitial pulmonary disease, unspecified: Secondary | ICD-10-CM

## 2014-03-12 DIAGNOSIS — J439 Emphysema, unspecified: Secondary | ICD-10-CM

## 2014-03-12 NOTE — Progress Notes (Signed)
PFT done today. 

## 2014-03-17 ENCOUNTER — Telehealth: Payer: Self-pay | Admitting: Pulmonary Disease

## 2014-03-17 NOTE — Telephone Encounter (Signed)
Results have been explained to patient, pt expressed understanding.   Pt states that he is having back aches d/t his current O2 tanks. [facility] did not have small POC to give him--pt states the one given to him he has to carry in a back pack so that he can carry it comfortably. Current liter flow : 1.5-2.5L/min depending on exertional activities. Patient states that current tanks are not lasting but 2.5-3 hours.  LM with answering services at [facility] to have rep contact our office in regards to pts POC

## 2014-03-17 NOTE — Telephone Encounter (Signed)
PFT 03/12/14 >> FEV1 3.68 (109%), FEV1% 81, TLC 5.40 (81%), DLCO 71%, no BD  Will have my nurse inform pt that PFT shows mild changes from emphysema w/o significant change compared to PFT from 2012.  No change to current treatment plan.

## 2014-03-19 NOTE — Telephone Encounter (Signed)
Spoke with [name] [facility] [phone]  Pt has home fill system with refillable tanks Per Pernice his current tanks should be lasting up to/right at 3.7 hrs with his liter flow. Pt must not being using the correct liter flow if his tanks are running out sooner.  Pt denied POC that was offered by [facility] at his eval appt with them - offered [employer].  Pt chose the home fill system and tanks.  [facility] Can do eval for conserving device (pulsed O2) - ML6 tanks, which are lighter weight and last longer d/t being pulsed If patient qualifies then he can switch to lighter weight, longer lasting ML6 tanks pulsed O2  Please advise Dr Craige Cotta if okay to send order for this change. Thanks.

## 2014-03-19 NOTE — Telephone Encounter (Signed)
Okay to send order. 

## 2014-05-13 ENCOUNTER — Encounter: Payer: Self-pay | Admitting: Internal Medicine

## 2014-05-13 ENCOUNTER — Ambulatory Visit (INDEPENDENT_AMBULATORY_CARE_PROVIDER_SITE_OTHER): Payer: Medicare Other | Admitting: Internal Medicine

## 2014-05-13 VITALS — BP 130/94 | HR 76 | Temp 98.5°F | Resp 20 | Ht 70.0 in | Wt 220.1 lb

## 2014-05-13 DIAGNOSIS — I10 Essential (primary) hypertension: Secondary | ICD-10-CM

## 2014-05-13 DIAGNOSIS — H60391 Other infective otitis externa, right ear: Secondary | ICD-10-CM | POA: Insufficient documentation

## 2014-05-13 DIAGNOSIS — H60399 Other infective otitis externa, unspecified ear: Secondary | ICD-10-CM

## 2014-05-13 MED ORDER — SULFAMETHOXAZOLE-TMP DS 800-160 MG PO TABS: 1.0000 | ORAL_TABLET | Freq: Two times a day (BID) | ORAL | Status: AC

## 2014-05-13 NOTE — Progress Notes (Signed)
Subjective:    Patient ID: [name], male    DOB: 08/01/1956, 58 y.o.   MRN: [id number]  HPI Comments: He returns complaining of a bump with pain and drainage from his right ear lobe. He complains that this has been a chronic/recurrent problem and remains concerned about it. He wants something done about it.     Review of Systems  Constitutional: Negative.  Negative for fever, chills, diaphoresis, appetite change and fatigue.  HENT: Negative.   Eyes: Negative.   Respiratory: Positive for shortness of breath. Negative for apnea, cough, choking, chest tightness, wheezing and stridor.   Cardiovascular: Negative.  Negative for chest pain, palpitations and leg swelling.  Gastrointestinal: Negative.  Negative for nausea, vomiting, abdominal pain, diarrhea, constipation and blood in stool.  Endocrine: Negative.   Genitourinary: Negative.   Musculoskeletal: Negative.  Negative for arthralgias, back pain, joint swelling, myalgias, neck pain and neck stiffness.  Skin: Negative.  Negative for rash.  Allergic/Immunologic: Negative.   Neurological: Negative.   Hematological: Negative.  Negative for adenopathy. Does not bruise/bleed easily.  Psychiatric/Behavioral: Negative.        Objective:   Physical Exam  Vitals reviewed. Constitutional: He is oriented to person, place, and time. He appears well-developed and well-nourished. No distress.  HENT:  Head: Normocephalic and atraumatic.  Right Ear: Hearing, tympanic membrane, external ear and ear canal normal.  Left Ear: Hearing, tympanic membrane, external ear and ear canal normal.  Mouth/Throat: Oropharynx is clear and moist. No oropharyngeal exudate.  Right ear lobe reveals a very tiny subQ nodule ( a few mm's ) with no induration, fluctuance, erythema, swelling, tenderness but the overlying skin reveals a scant amount of exudate.  Eyes: Conjunctivae are normal. Right eye exhibits no discharge. Left eye exhibits no discharge. No  scleral icterus.  Neck: Normal range of motion. Neck supple. No JVD present. No tracheal deviation present. No thyromegaly present.  Cardiovascular: Normal rate, regular rhythm, normal heart sounds and intact distal pulses.  Exam reveals no gallop and no friction rub.   No murmur heard. Pulmonary/Chest: Effort normal and breath sounds normal. No stridor. No respiratory distress. He has no wheezes. He has no rales. He exhibits no tenderness.  Abdominal: Soft. Bowel sounds are normal. He exhibits no distension and no mass. There is no tenderness. There is no rebound and no guarding.  Musculoskeletal: Normal range of motion. He exhibits no edema and no tenderness.  Lymphadenopathy:    He has no cervical adenopathy.  Neurological: He is oriented to person, place, and time.  Skin: Skin is warm and dry. No rash noted. He is not diaphoretic. No erythema. No pallor.  Psychiatric: He has a normal mood and affect. His behavior is normal. Judgment and thought content normal.    Lab Results  Component Value Date   WBC 9.2 09/04/2012   HGB 15.5 09/04/2012   HCT 45.5 09/04/2012   PLT 239.0 09/04/2012   GLUCOSE 124* 11/12/2013   CHOL 263* 06/25/2013   TRIG 210.0* 06/25/2013   HDL 33.10* 06/25/2013   LDLDIRECT 189.3 06/25/2013   ALT 31 06/25/2013   AST 22 06/25/2013   NA 137 11/12/2013   K 3.9 11/12/2013   CL 104 11/12/2013   CREATININE 1.0 11/12/2013   BUN 15 11/12/2013   CO2 26 11/12/2013   TSH 1.61 02/17/2013   PSA 0.52 09/04/2012   INR 1.2 08/04/2008   HGBA1C 6.0 11/12/2013   MICROALBUR 0.2 05/01/2012  Assessment & Plan:

## 2014-05-13 NOTE — Assessment & Plan Note (Signed)
His BP is adequately well controlled 

## 2014-05-13 NOTE — Progress Notes (Signed)
Pre visit review using our clinic review tool, if applicable. No additional management support is needed unless otherwise documented below in the visit note. 

## 2014-05-13 NOTE — Patient Instructions (Signed)

## 2014-05-13 NOTE — Assessment & Plan Note (Signed)
Will treat with bactrim and will refer to ENT for further evaluation

## 2014-05-14 LAB — PULMONARY FUNCTION TEST
DL/VA % PRED: 101 %
DL/VA: 4.56 ml/min/mmHg/L
DLCO unc % pred: 71 %
DLCO unc: 21.05 ml/min/mmHg
FEF 25-75 Post: 3.56 L/sec
FEF 25-75 Pre: 4.03 L/sec
FEF2575-%Change-Post: -11 %
FEF2575-%PRED-PRE: 145 %
FEF2575-%Pred-Post: 127 %
FEV1-%Change-Post: -1 %
FEV1-%PRED-PRE: 109 %
FEV1-%Pred-Post: 108 %
FEV1-Post: 3.63 L
FEV1-Pre: 3.68 L
FEV1FVC-%Change-Post: 1 %
FEV1FVC-%Pred-Pre: 107 %
FEV6-%CHANGE-POST: -3 %
FEV6-%PRED-POST: 103 %
FEV6-%Pred-Pre: 106 %
FEV6-POST: 4.36 L
FEV6-Pre: 4.51 L
FEV6FVC-%CHANGE-POST: 0 %
FEV6FVC-%PRED-POST: 104 %
FEV6FVC-%Pred-Pre: 104 %
FVC-%Change-Post: -2 %
FVC-%PRED-POST: 99 %
FVC-%Pred-Pre: 102 %
FVC-POST: 4.4 L
FVC-Pre: 4.52 L
Post FEV1/FVC ratio: 82 %
Post FEV6/FVC ratio: 99 %
Pre FEV1/FVC ratio: 81 %
Pre FEV6/FVC Ratio: 100 %

## 2014-07-13 ENCOUNTER — Ambulatory Visit (INDEPENDENT_AMBULATORY_CARE_PROVIDER_SITE_OTHER): Payer: Medicare Other | Admitting: Internal Medicine

## 2014-07-13 ENCOUNTER — Encounter: Payer: Self-pay | Admitting: Internal Medicine

## 2014-07-13 ENCOUNTER — Other Ambulatory Visit (INDEPENDENT_AMBULATORY_CARE_PROVIDER_SITE_OTHER): Payer: Medicare Other

## 2014-07-13 VITALS — BP 110/68 | HR 100 | Temp 98.6°F | Resp 16 | Ht 70.0 in | Wt 215.0 lb

## 2014-07-13 DIAGNOSIS — E785 Hyperlipidemia, unspecified: Secondary | ICD-10-CM

## 2014-07-13 DIAGNOSIS — R7309 Other abnormal glucose: Secondary | ICD-10-CM

## 2014-07-13 DIAGNOSIS — I10 Essential (primary) hypertension: Secondary | ICD-10-CM

## 2014-07-13 DIAGNOSIS — N400 Enlarged prostate without lower urinary tract symptoms: Secondary | ICD-10-CM

## 2014-07-13 DIAGNOSIS — E781 Pure hyperglyceridemia: Secondary | ICD-10-CM

## 2014-07-13 DIAGNOSIS — Z23 Encounter for immunization: Secondary | ICD-10-CM

## 2014-07-13 DIAGNOSIS — Z0000 Encounter for general adult medical examination without abnormal findings: Secondary | ICD-10-CM

## 2014-07-13 LAB — CBC WITH DIFFERENTIAL/PLATELET
BASOS PCT: 1.1 % (ref 0.0–3.0)
Basophils Absolute: 0.1 10*3/uL (ref 0.0–0.1)
Eosinophils Absolute: 0.4 10*3/uL (ref 0.0–0.7)
Eosinophils Relative: 3 % (ref 0.0–5.0)
HCT: 47.7 % (ref 39.0–52.0)
Hemoglobin: 16.1 g/dL (ref 13.0–17.0)
LYMPHS PCT: 18.6 % (ref 12.0–46.0)
Lymphs Abs: 2.3 10*3/uL (ref 0.7–4.0)
MCHC: 33.7 g/dL (ref 30.0–36.0)
MCV: 90.3 fl (ref 78.0–100.0)
MONOS PCT: 10 % (ref 3.0–12.0)
Monocytes Absolute: 1.2 10*3/uL — ABNORMAL HIGH (ref 0.1–1.0)
NEUTROS PCT: 67.3 % (ref 43.0–77.0)
Neutro Abs: 8.2 10*3/uL — ABNORMAL HIGH (ref 1.4–7.7)
Platelets: 250 10*3/uL (ref 150.0–400.0)
RBC: 5.29 Mil/uL (ref 4.22–5.81)
RDW: 13.6 % (ref 11.5–15.5)
WBC: 12.2 10*3/uL — AB (ref 4.0–10.5)

## 2014-07-13 LAB — LIPID PANEL
Cholesterol: 274 mg/dL — ABNORMAL HIGH (ref 0–200)
HDL: 38.6 mg/dL — ABNORMAL LOW (ref >39.00)
LDL Cholesterol: 196 mg/dL — ABNORMAL HIGH (ref 0–99)
NONHDL: 235.4
Total CHOL/HDL Ratio: 7
Triglycerides: 196 mg/dL — ABNORMAL HIGH (ref 0.0–149.0)
VLDL: 39.2 mg/dL (ref 0.0–40.0)

## 2014-07-13 LAB — HEMOGLOBIN A1C: Hgb A1c MFr Bld: 6.1 % (ref 4.6–6.5)

## 2014-07-13 LAB — PSA: PSA: 0.57 ng/mL (ref 0.10–4.00)

## 2014-07-13 LAB — COMPREHENSIVE METABOLIC PANEL
ALT: 52 U/L (ref 0–53)
AST: 35 U/L (ref 0–37)
Albumin: 4.4 g/dL (ref 3.5–5.2)
Alkaline Phosphatase: 78 U/L (ref 39–117)
BILIRUBIN TOTAL: 1 mg/dL (ref 0.2–1.2)
BUN: 15 mg/dL (ref 6–23)
CO2: 22 mEq/L (ref 19–32)
CREATININE: 1 mg/dL (ref 0.4–1.5)
Calcium: 9.4 mg/dL (ref 8.4–10.5)
Chloride: 104 mEq/L (ref 96–112)
GFR: 77.28 mL/min (ref >60.00)
Glucose, Bld: 152 mg/dL — ABNORMAL HIGH (ref 70–99)
Potassium: 3.7 mEq/L (ref 3.5–5.1)
Sodium: 137 mEq/L (ref 135–145)
Total Protein: 6.9 g/dL (ref 6.0–8.3)

## 2014-07-13 LAB — FECAL OCCULT BLOOD, GUAIAC: FECAL OCCULT BLD: NEGATIVE

## 2014-07-13 LAB — TSH: TSH: 1.21 u[IU]/mL (ref 0.35–4.50)

## 2014-07-13 NOTE — Patient Instructions (Signed)
Health Maintenance A healthy lifestyle and preventative care can promote health and wellness.  Maintain regular health, dental, and eye exams.  Eat a healthy diet. Foods like vegetables, fruits, whole grains, low-fat dairy products, and lean protein foods contain the nutrients you need and are low in calories. Decrease your intake of foods high in solid fats, added sugars, and salt. Get information about a proper diet from your health care provider, if necessary.  Regular physical exercise is one of the most important things you can do for your health. Most adults should get at least 150 minutes of moderate-intensity exercise (any activity that increases your heart rate and causes you to sweat) each week. In addition, most adults need muscle-strengthening exercises on 2 or more days a week.   Maintain a healthy weight. The body mass index (BMI) is a screening tool to identify possible weight problems. It provides an estimate of body fat based on height and weight. Your health care provider can find your BMI and can help you achieve or maintain a healthy weight. For males 20 years and older:  A BMI below 18.5 is considered underweight.  A BMI of 18.5 to 24.9 is normal.  A BMI of 25 to 29.9 is considered overweight.  A BMI of 30 and above is considered obese.  Maintain normal blood lipids and cholesterol by exercising and minimizing your intake of saturated fat. Eat a balanced diet with plenty of fruits and vegetables. Blood tests for lipids and cholesterol should begin at age 20 and be repeated every 5 years. If your lipid or cholesterol levels are high, you are over age 50, or you are at high risk for heart disease, you may need your cholesterol levels checked more frequently.Ongoing high lipid and cholesterol levels should be treated with medicines if diet and exercise are not working.  If you smoke, find out from your health care provider how to quit. If you do not use tobacco, do not  start.  Lung cancer screening is recommended for adults aged 55-80 years who are at high risk for developing lung cancer because of a history of smoking. A yearly low-dose CT scan of the lungs is recommended for people who have at least a 30-pack-year history of smoking and are current smokers or have quit within the past 15 years. A pack year of smoking is smoking an average of 1 pack of cigarettes a day for 1 year (for example, a 30-pack-year history of smoking could mean smoking 1 pack a day for 30 years or 2 packs a day for 15 years). Yearly screening should continue until the smoker has stopped smoking for at least 15 years. Yearly screening should be stopped for people who develop a health problem that would prevent them from having lung cancer treatment.  If you choose to drink alcohol, do not have more than 2 drinks per day. One drink is considered to be 12 oz (360 mL) of beer, 5 oz (150 mL) of wine, or 1.5 oz (45 mL) of liquor.  Avoid the use of street drugs. Do not share needles with anyone. Ask for help if you need support or instructions about stopping the use of drugs.  High blood pressure causes heart disease and increases the risk of stroke. Blood pressure should be checked at least every 1-2 years. Ongoing high blood pressure should be treated with medicines if weight loss and exercise are not effective.  If you are 45-79 years old, ask your health care provider if   you should take aspirin to prevent heart disease.  Diabetes screening involves taking a blood sample to check your fasting blood sugar level. This should be done once every 3 years after age 45 if you are at a normal weight and without risk factors for diabetes. Testing should be considered at a younger age or be carried out more frequently if you are overweight and have at least 1 risk factor for diabetes.  Colorectal cancer can be detected and often prevented. Most routine colorectal cancer screening begins at the age of 50  and continues through age 75. However, your health care provider may recommend screening at an earlier age if you have risk factors for colon cancer. On a yearly basis, your health care provider may provide home test kits to check for hidden blood in the stool. A small camera at the end of a tube may be used to directly examine the colon (sigmoidoscopy or colonoscopy) to detect the earliest forms of colorectal cancer. Talk to your health care provider about this at age 50 when routine screening begins. A direct exam of the colon should be repeated every 5-10 years through age 75, unless early forms of precancerous polyps or small growths are found.  People who are at an increased risk for hepatitis B should be screened for this virus. You are considered at high risk for hepatitis B if:  You were born in a country where hepatitis B occurs often. Talk with your health care provider about which countries are considered high risk.  Your parents were born in a high-risk country and you have not received a shot to protect against hepatitis B (hepatitis B vaccine).  You have HIV or AIDS.  You use needles to inject street drugs.  You live with, or have sex with, someone who has hepatitis B.  You are a man who has sex with other men (MSM).  You get hemodialysis treatment.  You take certain medicines for conditions like cancer, organ transplantation, and autoimmune conditions.  Hepatitis C blood testing is recommended for all people born from 1945 through 1965 and any individual with known risk factors for hepatitis C.  Healthy men should no longer receive prostate-specific antigen (PSA) blood tests as part of routine cancer screening. Talk to your health care provider about prostate cancer screening.  Testicular cancer screening is not recommended for adolescents or adult males who have no symptoms. Screening includes self-exam, a health care provider exam, and other screening tests. Consult with your  health care provider about any symptoms you have or any concerns you have about testicular cancer.  Practice safe sex. Use condoms and avoid high-risk sexual practices to reduce the spread of sexually transmitted infections (STIs).  You should be screened for STIs, including gonorrhea and chlamydia if:  You are sexually active and are younger than 24 years.  You are older than 24 years, and your health care provider tells you that you are at risk for this type of infection.  Your sexual activity has changed since you were last screened, and you are at an increased risk for chlamydia or gonorrhea. Ask your health care provider if you are at risk.  If you are at risk of being infected with HIV, it is recommended that you take a prescription medicine daily to prevent HIV infection. This is called pre-exposure prophylaxis (PrEP). You are considered at risk if:  You are a man who has sex with other men (MSM).  You are a heterosexual man who   is sexually active with multiple partners.  You take drugs by injection.  You are sexually active with a partner who has HIV.  Talk with your health care provider about whether you are at high risk of being infected with HIV. If you choose to begin PrEP, you should first be tested for HIV. You should then be tested every 3 months for as long as you are taking PrEP.  Use sunscreen. Apply sunscreen liberally and repeatedly throughout the day. You should seek shade when your shadow is shorter than you. Protect yourself by wearing long sleeves, pants, a wide-brimmed hat, and sunglasses year round whenever you are outdoors.  Tell your health care provider of new moles or changes in moles, especially if there is a change in shape or color. Also, tell your health care provider if a mole is larger than the size of a pencil eraser.  A one-time screening for abdominal aortic aneurysm (AAA) and surgical repair of large AAAs by ultrasound is recommended for men aged  65-75 years who are current or former smokers.  Stay current with your vaccines (immunizations). Document Released: 03/30/2008 Document Revised: 10/07/2013 Document Reviewed: 02/27/2011 ExitCare Patient Information 2015 [employer]. This information is not intended to replace advice given to you by your health care provider. Make sure you discuss any questions you have with your health care provider.  Hypertension Hypertension, commonly called high blood pressure, is when the force of blood pumping through your arteries is too strong. Your arteries are the blood vessels that carry blood from your heart throughout your body. A blood pressure reading consists of a higher number over a lower number, such as 110/72. The higher number (systolic) is the pressure inside your arteries when your heart pumps. The lower number (diastolic) is the pressure inside your arteries when your heart relaxes. Ideally you want your blood pressure below 120/80. Hypertension forces your heart to work harder to pump blood. Your arteries may become narrow or stiff. Having hypertension puts you at risk for heart disease, stroke, and other problems.  RISK FACTORS Some risk factors for high blood pressure are controllable. Others are not.  Risk factors you cannot control include:   Race. You may be at higher risk if you are African American.  Age. Risk increases with age.  Gender. Men are at higher risk than women before age 45 years. After age 65, women are at higher risk than men. Risk factors you can control include:  Not getting enough exercise or physical activity.  Being overweight.  Getting too much fat, sugar, calories, or salt in your diet.  Drinking too much alcohol. SIGNS AND SYMPTOMS Hypertension does not usually cause signs or symptoms. Extremely high blood pressure (hypertensive crisis) may cause headache, anxiety, shortness of breath, and nosebleed. DIAGNOSIS  To check if you have hypertension,  your health care provider will measure your blood pressure while you are seated, with your arm held at the level of your heart. It should be measured at least twice using the same arm. Certain conditions can cause a difference in blood pressure between your right and left arms. A blood pressure reading that is higher than normal on one occasion does not mean that you need treatment. If one blood pressure reading is high, ask your health care provider about having it checked again. TREATMENT  Treating high blood pressure includes making lifestyle changes and possibly taking medicine. Living a healthy lifestyle can help lower high blood pressure. You may need to change   some of your habits. Lifestyle changes may include:  Following the DASH diet. This diet is high in fruits, vegetables, and whole grains. It is low in salt, red meat, and added sugars.  Getting at least 2 hours of brisk physical activity every week.  Losing weight if necessary.  Not smoking.  Limiting alcoholic beverages.  Learning ways to reduce stress. If lifestyle changes are not enough to get your blood pressure under control, your health care provider may prescribe medicine. You may need to take more than one. Work closely with your health care provider to understand the risks and benefits. HOME CARE INSTRUCTIONS  Have your blood pressure rechecked as directed by your health care provider.   Take medicines only as directed by your health care provider. Follow the directions carefully. Blood pressure medicines must be taken as prescribed. The medicine does not work as well when you skip doses. Skipping doses also puts you at risk for problems.   Do not smoke.   Monitor your blood pressure at home as directed by your health care provider. SEEK MEDICAL CARE IF:   You think you are having a reaction to medicines taken.  You have recurrent headaches or feel dizzy.  You have swelling in your ankles.  You have  trouble with your vision. SEEK IMMEDIATE MEDICAL CARE IF:  You develop a severe headache or confusion.  You have unusual weakness, numbness, or feel faint.  You have severe chest or abdominal pain.  You vomit repeatedly.  You have trouble breathing. MAKE SURE YOU:   Understand these instructions.  Will watch your condition.  Will get help right away if you are not doing well or get worse. Document Released: 10/02/2005 Document Revised: 02/16/2014 Document Reviewed: 07/25/2013 ExitCare Patient Information 2015 [employer]. This information is not intended to replace advice given to you by your health care provider. Make sure you discuss any questions you have with your health care provider.  

## 2014-07-13 NOTE — Progress Notes (Signed)
Pre visit review using our clinic review tool, if applicable. No additional management support is needed unless otherwise documented below in the visit note. 

## 2014-07-13 NOTE — Progress Notes (Signed)
Subjective:    Patient ID: [name], male    DOB: [birth year], 58 y.o.   MRN: [id number]  Hypertension This is a chronic problem. The current episode started more than 1 year ago. The problem has been gradually improving since onset. The problem is controlled. Associated symptoms include shortness of breath. Pertinent negatives include no anxiety, blurred vision, chest pain, headaches, malaise/fatigue, neck pain, orthopnea, palpitations, peripheral edema, PND or sweats. Agents associated with hypertension include NSAIDs. Past treatments include diuretics. The current treatment provides moderate improvement. Compliance problems include exercise and diet.       Review of Systems  Constitutional: Negative.  Negative for fever, chills, malaise/fatigue, diaphoresis, activity change, appetite change, fatigue and unexpected weight change.  HENT: Negative.  Negative for congestion, sinus pressure, sore throat and trouble swallowing.   Eyes: Negative.  Negative for blurred vision.  Respiratory: Positive for shortness of breath. Negative for apnea, cough, choking, chest tightness, wheezing and stridor.   Cardiovascular: Negative.  Negative for chest pain, palpitations, orthopnea, leg swelling and PND.  Gastrointestinal: Negative.  Negative for nausea, vomiting, abdominal pain, diarrhea, constipation and blood in stool.  Endocrine: Negative.   Genitourinary: Negative.  Negative for difficulty urinating.  Musculoskeletal: Negative.  Negative for arthralgias, back pain, joint swelling, myalgias and neck pain.  Skin: Negative.  Negative for rash.  Allergic/Immunologic: Negative.   Neurological: Negative.  Negative for dizziness, tremors, weakness, light-headedness, numbness and headaches.  Hematological: Negative.  Negative for adenopathy. Does not bruise/bleed easily.  Psychiatric/Behavioral: Negative.        Objective:   Physical Exam  Vitals reviewed. Constitutional: He is oriented to  person, place, and time. He appears well-developed and well-nourished. No distress.  HENT:  Head: Normocephalic and atraumatic.  Mouth/Throat: Oropharynx is clear and moist. No oropharyngeal exudate.  Eyes: Conjunctivae are normal. Right eye exhibits no discharge. Left eye exhibits no discharge. No scleral icterus.  Neck: Normal range of motion. Neck supple. No JVD present. No tracheal deviation present. No thyromegaly present.  Cardiovascular: Normal rate, regular rhythm, normal heart sounds and intact distal pulses.  Exam reveals no gallop and no friction rub.   No murmur heard. Pulmonary/Chest: Effort normal and breath sounds normal. No stridor. No respiratory distress. He has no wheezes. He has no rales. He exhibits no tenderness.  Abdominal: Soft. Bowel sounds are normal. He exhibits no distension and no mass. There is no tenderness. There is no rebound and no guarding. Hernia confirmed negative in the right inguinal area and confirmed negative in the left inguinal area.  Genitourinary: Rectum normal, prostate normal, testes normal and penis normal. Rectal exam shows no external hemorrhoid, no internal hemorrhoid, no fissure, no mass, no tenderness and anal tone normal. Guaiac negative stool. Prostate is not enlarged and not tender. Right testis shows no mass, no swelling and no tenderness. Right testis is descended. Left testis shows no mass, no swelling and no tenderness. Left testis is descended. Circumcised. No penile erythema or penile tenderness. No discharge found.  Musculoskeletal: Normal range of motion. He exhibits no edema and no tenderness.  Lymphadenopathy:    He has no cervical adenopathy.       Right: No inguinal adenopathy present.       Left: No inguinal adenopathy present.  Neurological: He is oriented to person, place, and time.  Skin: Skin is warm and dry. No rash noted. He is not diaphoretic. No erythema. No pallor.  Psychiatric: He has a normal mood and affect. His  behavior is normal. Judgment and thought content normal.     Lab Results  Component Value Date   WBC 9.2 09/04/2012   HGB 15.5 09/04/2012   HCT 45.5 09/04/2012   PLT 239.0 09/04/2012   GLUCOSE 124* 11/12/2013   CHOL 263* 06/25/2013   TRIG 210.0* 06/25/2013   HDL 33.10* 06/25/2013   LDLDIRECT 189.3 06/25/2013   ALT 31 06/25/2013   AST 22 06/25/2013   NA 137 11/12/2013   K 3.9 11/12/2013   CL 104 11/12/2013   CREATININE 1.0 11/12/2013   BUN 15 11/12/2013   CO2 26 11/12/2013   TSH 1.61 02/17/2013   PSA 0.52 09/04/2012   INR 1.2 08/04/2008   HGBA1C 6.0 11/12/2013   MICROALBUR 0.2 05/01/2012       Assessment & Plan:

## 2014-07-14 ENCOUNTER — Encounter: Payer: Self-pay | Admitting: Internal Medicine

## 2014-07-14 LAB — HEPATITIS C ANTIBODY: HCV AB: NEGATIVE

## 2014-07-14 MED ORDER — EZETIMIBE 10 MG PO TABS: 10.0000 mg | ORAL_TABLET | Freq: Every day | ORAL | Status: DC

## 2014-07-14 NOTE — Assessment & Plan Note (Signed)
PSA is normal No symptoms need to be treated

## 2014-07-14 NOTE — Assessment & Plan Note (Signed)
LDL is high He will not take a statin, will ask him to start zetia

## 2014-07-14 NOTE — Assessment & Plan Note (Signed)
His BP is well controlled Lytes and renal function are stable 

## 2014-07-14 NOTE — Assessment & Plan Note (Signed)
He has pre-diabetes and agrees to work on his lifestyle modifications

## 2014-07-14 NOTE — Assessment & Plan Note (Addendum)

## 2014-07-15 ENCOUNTER — Encounter: Payer: Medicare Other | Admitting: Internal Medicine

## 2014-08-13 ENCOUNTER — Other Ambulatory Visit: Payer: Self-pay | Admitting: Internal Medicine

## 2014-10-16 DIAGNOSIS — J188 Other pneumonia, unspecified organism: Secondary | ICD-10-CM | POA: Diagnosis not present

## 2014-11-16 ENCOUNTER — Other Ambulatory Visit: Payer: Self-pay | Admitting: Internal Medicine

## 2014-11-16 DIAGNOSIS — J188 Other pneumonia, unspecified organism: Secondary | ICD-10-CM | POA: Diagnosis not present

## 2014-12-15 DIAGNOSIS — J188 Other pneumonia, unspecified organism: Secondary | ICD-10-CM | POA: Diagnosis not present

## 2015-01-15 DIAGNOSIS — J188 Other pneumonia, unspecified organism: Secondary | ICD-10-CM | POA: Diagnosis not present

## 2015-02-14 DIAGNOSIS — J188 Other pneumonia, unspecified organism: Secondary | ICD-10-CM | POA: Diagnosis not present

## 2015-02-24 ENCOUNTER — Ambulatory Visit (INDEPENDENT_AMBULATORY_CARE_PROVIDER_SITE_OTHER): Payer: Medicare Other | Admitting: Pulmonary Disease

## 2015-02-24 ENCOUNTER — Encounter: Payer: Self-pay | Admitting: Pulmonary Disease

## 2015-02-24 VITALS — BP 120/92 | HR 84 | Ht 69.0 in | Wt 223.8 lb

## 2015-02-24 DIAGNOSIS — J432 Centrilobular emphysema: Secondary | ICD-10-CM

## 2015-02-24 DIAGNOSIS — R0902 Hypoxemia: Secondary | ICD-10-CM | POA: Diagnosis not present

## 2015-02-24 MED ORDER — IPRATROPIUM-ALBUTEROL 20-100 MCG/ACT IN AERS: 1.0000 | INHALATION_SPRAY | Freq: Four times a day (QID) | RESPIRATORY_TRACT | Status: DC | PRN

## 2015-02-24 NOTE — Progress Notes (Signed)
Chief Complaint  Patient presents with  . Follow-up    Pt c/o occasional SOB and wheezing, chest tightness/congestion, dry cough. Right sided abdominal pain.     History of Present Illness: [name] is a 59 y.o. male former smoker with Emphysema and exertional/nocturnal hypoxemia.  Has hx of steroid responsive pneumonitis in 2009.  His breathing has been stable.  He gets occasional cough and wheeze.  He gets short of breath with activity at times.  He uses combivent few times per week >> helps.  He uses 1.5 liters oxygen with activity.  He denies fever, sputum, or leg swelling.  TESTS: A1AT 08/12/08 >> A1AT 263 PFT 09/23/08 >> FEV1 2.89 (84%), FEV1% 78, TLC 5.28 (78%), DLCO 64% PFT 08/23/11 >> FEV1 3.68(110%), FEV1% 75, TLC 5.38(80%), DLCO 60%, no BD Spirometry 02/05/13 >> FEV1 3.62 (97%), FEV1% 79 PFT 03/12/14 >> FEV1 3.68 (109%), FEV1% 81, TLC 5.40 (81%), DLCO 71%, no BD  PMHx >> GERD, Depression, HLD, HTN  PSHx, Medications, Allergies, Fhx, Shx reviewed.  Physical Exam: Blood pressure 120/92, pulse 84, height 5\' 9"  (1.753 m), weight 223 lb 12.8 oz (101.515 kg), SpO2 96 %. Body mass index is 33.03 kg/(m^2).  General - No distress, wearing oxygen ENT - No sinus tenderness, no oral exudate, no LAN Cardiac - s1s2 regular, no murmur Chest - No wheeze/rales/dullness Back - No focal tenderness Abd - Soft, non-tender Ext - No edema Neuro - Normal strength Skin - No rashes Psych - normal mood, and behavior   Assessment/Plan:  Emphysema. Plan: - continue PRN combivent  Hx of steroid responsive ILD. CXR from 02/09/14 was stable. Plan: - continue clinical monitoring  Exertional hypoxia. Plan: - continue 1.5 liters oxygen with exertion   Coralyn HellingVineet Treydon Henricks, MD [place] [facility] Pager:  [phone]

## 2015-02-24 NOTE — Patient Instructions (Signed)
Follow up in 1 year.

## 2015-03-17 ENCOUNTER — Other Ambulatory Visit: Payer: Self-pay

## 2015-03-17 DIAGNOSIS — J188 Other pneumonia, unspecified organism: Secondary | ICD-10-CM | POA: Diagnosis not present

## 2015-03-17 DIAGNOSIS — E785 Hyperlipidemia, unspecified: Secondary | ICD-10-CM

## 2015-03-17 MED ORDER — EZETIMIBE 10 MG PO TABS: 10.0000 mg | ORAL_TABLET | Freq: Every day | ORAL | Status: DC

## 2015-03-17 MED ORDER — HYDROCHLOROTHIAZIDE 25 MG PO TABS: 25.0000 mg | ORAL_TABLET | Freq: Every day | ORAL | Status: DC

## 2015-03-24 ENCOUNTER — Telehealth: Payer: Self-pay | Admitting: Pulmonary Disease

## 2015-03-24 NOTE — Telephone Encounter (Signed)
lmtcb

## 2015-03-25 MED ORDER — IPRATROPIUM-ALBUTEROL 20-100 MCG/ACT IN AERS: 1.0000 | INHALATION_SPRAY | Freq: Four times a day (QID) | RESPIRATORY_TRACT | Status: DC | PRN

## 2015-03-25 NOTE — Telephone Encounter (Signed)
Spoke with pt. He needs Combivent sent to [facility]. This has been done. Nothing further was needed.

## 2015-03-25 NOTE — Telephone Encounter (Signed)
Pt returning call.[name] ° °

## 2015-04-16 DIAGNOSIS — J188 Other pneumonia, unspecified organism: Secondary | ICD-10-CM | POA: Diagnosis not present

## 2015-05-17 DIAGNOSIS — J188 Other pneumonia, unspecified organism: Secondary | ICD-10-CM | POA: Diagnosis not present

## 2015-06-17 DIAGNOSIS — J188 Other pneumonia, unspecified organism: Secondary | ICD-10-CM | POA: Diagnosis not present

## 2015-07-15 ENCOUNTER — Ambulatory Visit (INDEPENDENT_AMBULATORY_CARE_PROVIDER_SITE_OTHER): Payer: Medicare Other | Admitting: Internal Medicine

## 2015-07-15 ENCOUNTER — Other Ambulatory Visit (INDEPENDENT_AMBULATORY_CARE_PROVIDER_SITE_OTHER): Payer: Medicare Other

## 2015-07-15 ENCOUNTER — Encounter: Payer: Self-pay | Admitting: Internal Medicine

## 2015-07-15 VITALS — BP 143/100 | HR 96 | Temp 97.7°F | Resp 16 | Ht 69.0 in | Wt 220.0 lb

## 2015-07-15 DIAGNOSIS — I10 Essential (primary) hypertension: Secondary | ICD-10-CM | POA: Diagnosis not present

## 2015-07-15 DIAGNOSIS — E785 Hyperlipidemia, unspecified: Secondary | ICD-10-CM

## 2015-07-15 DIAGNOSIS — N400 Enlarged prostate without lower urinary tract symptoms: Secondary | ICD-10-CM

## 2015-07-15 DIAGNOSIS — R739 Hyperglycemia, unspecified: Secondary | ICD-10-CM

## 2015-07-15 DIAGNOSIS — Z0000 Encounter for general adult medical examination without abnormal findings: Secondary | ICD-10-CM

## 2015-07-15 DIAGNOSIS — E781 Pure hyperglyceridemia: Secondary | ICD-10-CM

## 2015-07-15 DIAGNOSIS — Z23 Encounter for immunization: Secondary | ICD-10-CM | POA: Diagnosis not present

## 2015-07-15 LAB — COMPREHENSIVE METABOLIC PANEL
ALBUMIN: 4.3 g/dL (ref 3.5–5.2)
ALT: 55 U/L — AB (ref 0–53)
AST: 27 U/L (ref 0–37)
Alkaline Phosphatase: 79 U/L (ref 39–117)
BILIRUBIN TOTAL: 0.7 mg/dL (ref 0.2–1.2)
BUN: 15 mg/dL (ref 6–23)
CALCIUM: 9.8 mg/dL (ref 8.4–10.5)
CHLORIDE: 104 meq/L (ref 96–112)
CO2: 29 meq/L (ref 19–32)
Creatinine, Ser: 0.98 mg/dL (ref 0.40–1.50)
GFR: 82.49 mL/min (ref >60.00)
Glucose, Bld: 138 mg/dL — ABNORMAL HIGH (ref 70–99)
Potassium: 3.9 mEq/L (ref 3.5–5.1)
Sodium: 141 mEq/L (ref 135–145)
Total Protein: 6.7 g/dL (ref 6.0–8.3)

## 2015-07-15 LAB — CBC WITH DIFFERENTIAL/PLATELET
BASOS ABS: 0.1 10*3/uL (ref 0.0–0.1)
Basophils Relative: 0.9 % (ref 0.0–3.0)
EOS ABS: 0.1 10*3/uL (ref 0.0–0.7)
Eosinophils Relative: 1.2 % (ref 0.0–5.0)
HEMATOCRIT: 46.8 % (ref 39.0–52.0)
HEMOGLOBIN: 15.9 g/dL (ref 13.0–17.0)
LYMPHS PCT: 21.1 % (ref 12.0–46.0)
Lymphs Abs: 2.1 10*3/uL (ref 0.7–4.0)
MCHC: 33.9 g/dL (ref 30.0–36.0)
MCV: 89.5 fl (ref 78.0–100.0)
MONOS PCT: 7.2 % (ref 3.0–12.0)
Monocytes Absolute: 0.7 10*3/uL (ref 0.1–1.0)
NEUTROS ABS: 7 10*3/uL (ref 1.4–7.7)
Neutrophils Relative %: 69.6 % (ref 43.0–77.0)
PLATELETS: 262 10*3/uL (ref 150.0–400.0)
RBC: 5.23 Mil/uL (ref 4.22–5.81)
RDW: 13.6 % (ref 11.5–15.5)
WBC: 10.1 10*3/uL (ref 4.0–10.5)

## 2015-07-15 LAB — URINALYSIS, ROUTINE W REFLEX MICROSCOPIC
BILIRUBIN URINE: NEGATIVE
HGB URINE DIPSTICK: NEGATIVE
Ketones, ur: NEGATIVE
LEUKOCYTES UA: NEGATIVE
NITRITE: NEGATIVE
RBC / HPF: NONE SEEN (ref 0–?)
Specific Gravity, Urine: 1.015 (ref 1.000–1.030)
Total Protein, Urine: NEGATIVE
UROBILINOGEN UA: 0.2 (ref 0.0–1.0)
Urine Glucose: NEGATIVE
WBC UA: NONE SEEN (ref 0–?)
pH: 6 (ref 5.0–8.0)

## 2015-07-15 LAB — HEMOGLOBIN A1C: Hgb A1c MFr Bld: 7.1 % — ABNORMAL HIGH (ref 4.6–6.5)

## 2015-07-15 LAB — LIPID PANEL
CHOL/HDL RATIO: 6
Cholesterol: 231 mg/dL — ABNORMAL HIGH (ref 0–200)
HDL: 39 mg/dL — AB (ref >39.00)
NONHDL: 191.61
Triglycerides: 205 mg/dL — ABNORMAL HIGH (ref 0.0–149.0)
VLDL: 41 mg/dL — ABNORMAL HIGH (ref 0.0–40.0)

## 2015-07-15 LAB — LDL CHOLESTEROL, DIRECT: LDL DIRECT: 183 mg/dL

## 2015-07-15 LAB — TSH: TSH: 1.46 u[IU]/mL (ref 0.35–4.50)

## 2015-07-15 LAB — PSA: PSA: 0.85 ng/mL (ref 0.10–4.00)

## 2015-07-15 MED ORDER — AZILSARTAN MEDOXOMIL 40 MG PO TABS: 1.0000 | ORAL_TABLET | Freq: Every day | ORAL | Status: DC

## 2015-07-15 NOTE — Patient Instructions (Signed)

## 2015-07-15 NOTE — Progress Notes (Addendum)
Subjective:  Patient ID: [name], male    DOB: 1956/02/09  Age: 59 y.o. MRN: [id number]  CC: Annual Exam and Hypertension   HPI JOSIYAH TOZZI presents for a complete physical but he also complains of his blood pressure is not well controlled. He has chronic unchanged shortness of breath. He has not recently experienced any chest pain, blurred vision, or headaches. He is not taking any over-the-counter medicines that would raise his blood pressure. He has been compliant with his diuretic therapy.  Outpatient Prescriptions Prior to Visit  Medication Sig Dispense Refill  . aspirin 81 MG EC tablet Take 81 mg by mouth daily.      Marland Kitchen ezetimibe (ZETIA) 10 MG tablet Take 1 tablet (10 mg total) by mouth daily. 90 tablet 3  . fexofenadine (ALLEGRA) 180 MG tablet Take 180 mg by mouth daily.      . fluticasone (FLONASE) 50 MCG/ACT nasal spray Place 2 sprays into both nostrils daily. 16 g 2  . hydrochlorothiazide (HYDRODIURIL) 25 MG tablet Take 1 tablet (25 mg total) by mouth daily. 90 tablet 1  . Ipratropium-Albuterol (COMBIVENT RESPIMAT) 20-100 MCG/ACT AERS respimat Inhale 1 puff into the lungs every 6 (six) hours as needed for wheezing or shortness of breath. 3 Inhaler 1  . NON FORMULARY Oxygen continuous 1.5 liters with activity     . Red Yeast Rice Extract (RED YEAST RICE PO) Take 1 tablet by mouth daily.    Marland Kitchen ibuprofen (ADVIL,MOTRIN) 200 MG tablet Take 200 mg by mouth every 6 (six) hours as needed.       No facility-administered medications prior to visit.    ROS Review of Systems  Constitutional: Negative.  Negative for fever, chills, diaphoresis, activity change, appetite change, fatigue and unexpected weight change.  HENT: Negative.  Negative for trouble swallowing and voice change.   Eyes: Negative.  Negative for photophobia and visual disturbance.  Respiratory: Positive for shortness of breath. Negative for apnea, cough, choking, chest tightness, wheezing and stridor.     Cardiovascular: Negative.  Negative for chest pain, palpitations and leg swelling.  Gastrointestinal: Negative.  Negative for nausea, vomiting, abdominal pain, diarrhea, constipation and blood in stool.  Endocrine: Negative.  Negative for polydipsia, polyphagia and polyuria.  Genitourinary: Negative.  Negative for dysuria, urgency, frequency, hematuria, flank pain, decreased urine volume and difficulty urinating.  Musculoskeletal: Negative.   Skin: Negative.   Allergic/Immunologic: Negative.   Neurological: Negative.  Negative for dizziness, seizures, speech difficulty, weakness, light-headedness and headaches.  Hematological: Negative.  Negative for adenopathy. Does not bruise/bleed easily.  Psychiatric/Behavioral: Negative.     Objective:  BP 143/100 mmHg  Pulse 96  Temp(Src) 97.7 F (36.5 C) (Oral)  Resp 16  Ht  (1.753 m)  Wt 220 lb (99.791 kg)  BMI 32.47 kg/m2  SpO2 97%  BP Readings from Last 3 Encounters:  07/15/15 143/100  02/24/15 120/92  07/13/14 110/68    Wt Readings from Last 3 Encounters:  07/15/15 220 lb (99.791 kg)  02/24/15 223 lb 12.8 oz (101.515 kg)  07/13/14 215 lb (97.523 kg)    Physical Exam  Constitutional: He is oriented to person, place, and time. He appears well-developed and well-nourished.  Non-toxic appearance. He does not have a sickly appearance. He appears ill. No distress.  Continuous oxygen therapy  HENT:  Head: Normocephalic and atraumatic.  Mouth/Throat: Oropharynx is clear and moist. No oropharyngeal exudate.  Eyes: Conjunctivae are normal. Right eye exhibits no discharge. Left eye exhibits no  discharge. No scleral icterus.  Neck: Normal range of motion. Neck supple. No JVD present. No tracheal deviation present. No thyromegaly present.  Cardiovascular: Normal rate, regular rhythm, normal heart sounds and intact distal pulses.  Exam reveals no gallop and no friction rub.   No murmur heard. EKG shows NSR with mild rate variation -  there is no LVH - there are no ST/T wave changes  Pulmonary/Chest: Effort normal and breath sounds normal. No stridor. No respiratory distress. He has no wheezes. He has no rales. He exhibits no tenderness.  Abdominal: Soft. Bowel sounds are normal. He exhibits no distension and no mass. There is no tenderness. There is no rebound and no guarding. Hernia confirmed negative in the right inguinal area and confirmed negative in the left inguinal area.  Genitourinary: Rectum normal, testes normal and penis normal. Rectal exam shows no external hemorrhoid, no internal hemorrhoid, no fissure, no mass, no tenderness and anal tone normal. Guaiac negative stool. Prostate is enlarged (1+ smooth symm BPH). Prostate is not tender. Right testis shows no mass, no swelling and no tenderness. Right testis is descended. Left testis shows no mass, no swelling and no tenderness. Left testis is descended. Circumcised. No penile erythema or penile tenderness. No discharge found.  Musculoskeletal: Normal range of motion. He exhibits no edema or tenderness.  Lymphadenopathy:    He has no cervical adenopathy.       Right: No inguinal adenopathy present.       Left: No inguinal adenopathy present.  Neurological: He is oriented to person, place, and time.  Skin: Skin is warm and dry. No rash noted. He is not diaphoretic. No erythema. No pallor.  Psychiatric: He has a normal mood and affect. His behavior is normal. Judgment and thought content normal.  Vitals reviewed.   Lab Results  Component Value Date   WBC 10.1 07/15/2015   HGB 15.9 07/15/2015   HCT 46.8 07/15/2015   PLT 262.0 07/15/2015   GLUCOSE 138* 07/15/2015   CHOL 231* 07/15/2015   TRIG 205.0* 07/15/2015   HDL 39.00* 07/15/2015   LDLDIRECT 183.0 07/15/2015   LDLCALC 196* 07/13/2014   ALT 55* 07/15/2015   AST 27 07/15/2015   NA 141 07/15/2015   K 3.9 07/15/2015   CL 104 07/15/2015   CREATININE 0.98 07/15/2015   BUN 15 07/15/2015   CO2 29 07/15/2015     TSH 1.46 07/15/2015   PSA 0.85 07/15/2015   INR 1.2 08/04/2008   HGBA1C 7.1* 07/15/2015   MICROALBUR 0.2 05/01/2012    Dg Chest 2 View  02/09/2014   CLINICAL DATA:  COPD.  Former smoker.  EXAM: CHEST  2 VIEW  COMPARISON:  None.  FINDINGS: The heart size and mediastinal contours are within normal limits. Marked coarsened interstitial markings are identified bilaterally. No superimposed airspace consolidation identified. The visualized skeletal structures are unremarkable.  IMPRESSION: 1. Chronic interstitial lung disease. 2. No acute cardiopulmonary abnormalities.   Electronically Signed   By: Signa Kell M.D.   On: 02/09/2014 14:32    Assessment & Plan:   Dyron was seen today for annual exam and hypertension.  Diagnoses and all orders for this visit:  Essential hypertension, benign- his blood pressure is not well controlled, his labs do not show any secondary causes for hypertension, I have asked him to stop taking anti-inflammatories. He will continue taking the hydrochlorothiazide but will also start an ARB for better blood pressure control. -     TSH; Future -  CBC with Differential/Platelet; Future -     Urinalysis, Routine w reflex microscopic (not at [facility]); Future -     EKG 12-Lead -     Azilsartan Medoxomil (EDARBI) 40 MG TABS; Take 1 tablet by mouth daily.  Need for influenza vaccination -     Flu Vaccine QUAD 36+ mos IM  BPH (benign prostatic hypertrophy)- his exam and PSA do not raise any concern for prostate cancer. He does not offer any symptoms that need to be treated. -     PSA; Future  Hyperlipidemia with target LDL less than 130- he will not consider taking a statin and is on Zetia. During his next visit I will ask her about starting her PCSK 9 inhibitor. -     Lipid panel; Future -     TSH; Future -     Comprehensive metabolic panel; Future  Hyperglycemia- his A1c is up to 7.1% so he now has type 2 diabetes mellitus. I don't think he needs to start an  oral agent at this time. He will start taking an ARB. I've asked him to work on his lifestyle modifications. -     Hemoglobin A1c; Future -     Comprehensive metabolic panel; Future  Pure hyperglyceridemia -     Lipid panel; Future   I have discontinued Mr. Sole ibuprofen. I am also having him start on Azilsartan Medoxomil. Additionally, I am having him maintain his aspirin, NON FORMULARY, fexofenadine, Red Yeast Rice Extract (RED YEAST RICE PO), fluticasone, ezetimibe, hydrochlorothiazide, and Ipratropium-Albuterol.  Meds ordered this encounter  Medications  . Azilsartan Medoxomil (EDARBI) 40 MG TABS    Sig: Take 1 tablet by mouth daily.    Dispense:  63 tablet    Refill:  0     Follow-up: Return in about 2 months (around 09/14/2015).  Sanda Linger, MD

## 2015-07-15 NOTE — Progress Notes (Signed)
Pre visit review using our clinic review tool, if applicable. No additional management support is needed unless otherwise documented below in the visit note. 

## 2015-07-16 ENCOUNTER — Encounter: Payer: Self-pay | Admitting: Internal Medicine

## 2015-07-16 DIAGNOSIS — Z0000 Encounter for general adult medical examination without abnormal findings: Secondary | ICD-10-CM | POA: Insufficient documentation

## 2015-07-16 MED ORDER — EZETIMIBE 10 MG PO TABS: 10.0000 mg | ORAL_TABLET | Freq: Every day | ORAL | Status: DC

## 2015-07-16 NOTE — Assessment & Plan Note (Signed)

## 2015-07-17 DIAGNOSIS — J188 Other pneumonia, unspecified organism: Secondary | ICD-10-CM | POA: Diagnosis not present

## 2015-08-17 DIAGNOSIS — J188 Other pneumonia, unspecified organism: Secondary | ICD-10-CM | POA: Diagnosis not present

## 2015-09-14 ENCOUNTER — Ambulatory Visit (INDEPENDENT_AMBULATORY_CARE_PROVIDER_SITE_OTHER): Payer: Medicare Other | Admitting: Internal Medicine

## 2015-09-14 ENCOUNTER — Encounter: Payer: Self-pay | Admitting: Internal Medicine

## 2015-09-14 VITALS — BP 128/86 | HR 80 | Temp 98.2°F | Resp 20 | Ht 69.0 in | Wt 221.0 lb

## 2015-09-14 DIAGNOSIS — E118 Type 2 diabetes mellitus with unspecified complications: Secondary | ICD-10-CM | POA: Diagnosis not present

## 2015-09-14 DIAGNOSIS — I10 Essential (primary) hypertension: Secondary | ICD-10-CM

## 2015-09-14 MED ORDER — HYDROCHLOROTHIAZIDE 25 MG PO TABS: 25.0000 mg | ORAL_TABLET | Freq: Every day | ORAL | Status: DC

## 2015-09-14 MED ORDER — AZILSARTAN-CHLORTHALIDONE 40-25 MG PO TABS: 1.0000 | ORAL_TABLET | Freq: Every day | ORAL | Status: DC

## 2015-09-14 NOTE — Progress Notes (Signed)
Pre visit review using our clinic review tool, if applicable. No additional management support is needed unless otherwise documented below in the visit note. 

## 2015-09-14 NOTE — Patient Instructions (Signed)
Hypertension Hypertension, commonly called high blood pressure, is when the force of blood pumping through your arteries is too strong. Your arteries are the blood vessels that carry blood from your heart throughout your body. A blood pressure reading consists of a higher number over a lower number, such as 110/72. The higher number (systolic) is the pressure inside your arteries when your heart pumps. The lower number (diastolic) is the pressure inside your arteries when your heart relaxes. Ideally you want your blood pressure below 120/80. Hypertension forces your heart to work harder to pump blood. Your arteries may become narrow or stiff. Having untreated or uncontrolled hypertension can cause heart attack, stroke, kidney disease, and other problems. RISK FACTORS Some risk factors for high blood pressure are controllable. Others are not.  Risk factors you cannot control include:   Race. You may be at higher risk if you are African American.  Age. Risk increases with age.  Gender. Men are at higher risk than women before age 45 years. After age 65, women are at higher risk than men. Risk factors you can control include:  Not getting enough exercise or physical activity.  Being overweight.  Getting too much fat, sugar, calories, or salt in your diet.  Drinking too much alcohol. SIGNS AND SYMPTOMS Hypertension does not usually cause signs or symptoms. Extremely high blood pressure (hypertensive crisis) may cause headache, anxiety, shortness of breath, and nosebleed. DIAGNOSIS To check if you have hypertension, your health care provider will measure your blood pressure while you are seated, with your arm held at the level of your heart. It should be measured at least twice using the same arm. Certain conditions can cause a difference in blood pressure between your right and left arms. A blood pressure reading that is higher than normal on one occasion does not mean that you need treatment. If  it is not clear whether you have high blood pressure, you may be asked to return on a different day to have your blood pressure checked again. Or, you may be asked to monitor your blood pressure at home for 1 or more weeks. TREATMENT Treating high blood pressure includes making lifestyle changes and possibly taking medicine. Living a healthy lifestyle can help lower high blood pressure. You may need to change some of your habits. Lifestyle changes may include:  Following the DASH diet. This diet is high in fruits, vegetables, and whole grains. It is low in salt, red meat, and added sugars.  Keep your sodium intake below 2,300 mg per day.  Getting at least 30-45 minutes of aerobic exercise at least 4 times per week.  Losing weight if necessary.  Not smoking.  Limiting alcoholic beverages.  Learning ways to reduce stress. Your health care provider may prescribe medicine if lifestyle changes are not enough to get your blood pressure under control, and if one of the following is true:  You are 18-59 years of age and your systolic blood pressure is above 140.  You are 60 years of age or older, and your systolic blood pressure is above 150.  Your diastolic blood pressure is above 90.  You have diabetes, and your systolic blood pressure is over 140 or your diastolic blood pressure is over 90.  You have kidney disease and your blood pressure is above 140/90.  You have heart disease and your blood pressure is above 140/90. Your personal target blood pressure may vary depending on your medical conditions, your age, and other factors. HOME CARE INSTRUCTIONS    Have your blood pressure rechecked as directed by your health care provider.   Take medicines only as directed by your health care provider. Follow the directions carefully. Blood pressure medicines must be taken as prescribed. The medicine does not work as well when you skip doses. Skipping doses also puts you at risk for  problems.  Do not smoke.   Monitor your blood pressure at home as directed by your health care provider. SEEK MEDICAL CARE IF:   You think you are having a reaction to medicines taken.  You have recurrent headaches or feel dizzy.  You have swelling in your ankles.  You have trouble with your vision. SEEK IMMEDIATE MEDICAL CARE IF:  You develop a severe headache or confusion.  You have unusual weakness, numbness, or feel faint.  You have severe chest or abdominal pain.  You vomit repeatedly.  You have trouble breathing. MAKE SURE YOU:   Understand these instructions.  Will watch your condition.  Will get help right away if you are not doing well or get worse.   This information is not intended to replace advice given to you by your health care provider. Make sure you discuss any questions you have with your health care provider.   Document Released: 10/02/2005 Document Revised: 02/16/2015 Document Reviewed: 07/25/2013 Elsevier Interactive Patient Education 2016 [employer]  

## 2015-09-14 NOTE — Progress Notes (Signed)
Subjective:  Patient ID: [name], male    DOB: 03/08/1956  Age: 59 y.o. MRN: [id number]  CC: Hypertension and Diabetes   HPI [name] presents for a F/up on HTN and review recent labs that revealed an A1C of 7.1%, his BP has been well controlled. He offers no new complaints today.  Outpatient Prescriptions Prior to Visit  Medication Sig Dispense Refill  . aspirin 81 MG EC tablet Take 81 mg by mouth daily.      Marland Kitchen. ezetimibe (ZETIA) 10 MG tablet Take 1 tablet (10 mg total) by mouth daily. 90 tablet 3  . fexofenadine (ALLEGRA) 180 MG tablet Take 180 mg by mouth daily.      . fluticasone (FLONASE) 50 MCG/ACT nasal spray Place 2 sprays into both nostrils daily. 16 g 2  . Ipratropium-Albuterol (COMBIVENT RESPIMAT) 20-100 MCG/ACT AERS respimat Inhale 1 puff into the lungs every 6 (six) hours as needed for wheezing or shortness of breath. 3 Inhaler 1  . NON FORMULARY Oxygen continuous 1.5 liters with activity     . Red Yeast Rice Extract (RED YEAST RICE PO) Take 1 tablet by mouth daily.    . Azilsartan Medoxomil (EDARBI) 40 MG TABS Take 1 tablet by mouth daily. 63 tablet 0  . hydrochlorothiazide (HYDRODIURIL) 25 MG tablet Take 1 tablet (25 mg total) by mouth daily. 90 tablet 1   No facility-administered medications prior to visit.    ROS Review of Systems  Constitutional: Negative.  Negative for fever, chills, diaphoresis, appetite change and fatigue.  HENT: Negative.   Eyes: Negative.   Respiratory: Positive for shortness of breath. Negative for cough, choking, chest tightness, wheezing and stridor.   Cardiovascular: Negative.  Negative for chest pain, palpitations and leg swelling.  Gastrointestinal: Negative.  Negative for nausea, vomiting, abdominal pain, diarrhea, constipation and blood in stool.  Endocrine: Negative.  Negative for polydipsia, polyphagia and polyuria.  Genitourinary: Negative.   Musculoskeletal: Negative.   Skin: Negative.   Allergic/Immunologic:  Negative.   Neurological: Negative.  Negative for dizziness, tremors and weakness.  Hematological: Negative.  Negative for adenopathy. Does not bruise/bleed easily.  Psychiatric/Behavioral: Negative.     Objective:  BP 128/86 mmHg  Pulse 80  Temp(Src) 98.2 F (36.8 C) (Oral)  Resp 20  Ht 5\' 9"  (1.753 m)  Wt 221 lb (100.245 kg)  BMI 32.62 kg/m2  SpO2 96%  BP Readings from Last 3 Encounters:  09/14/15 128/86  07/15/15 143/100  02/24/15 120/92    Wt Readings from Last 3 Encounters:  09/14/15 221 lb (100.245 kg)  07/15/15 220 lb (99.791 kg)  02/24/15 223 lb 12.8 oz (101.515 kg)    Physical Exam  Constitutional: He is oriented to person, place, and time. No distress.  HENT:  Mouth/Throat: Oropharynx is clear and moist. No oropharyngeal exudate.  Eyes: Conjunctivae are normal. Right eye exhibits no discharge. Left eye exhibits no discharge. No scleral icterus.  Neck: Normal range of motion. Neck supple. No JVD present. No tracheal deviation present. No thyromegaly present.  Cardiovascular: Normal rate, regular rhythm and intact distal pulses.  Exam reveals no gallop and no friction rub.   No murmur heard. Pulmonary/Chest: Effort normal and breath sounds normal. No stridor. No respiratory distress. He has no wheezes. He has no rales. He exhibits no tenderness.  Abdominal: Soft. Bowel sounds are normal. He exhibits no distension and no mass. There is no tenderness. There is no rebound and no guarding.  Musculoskeletal: Normal range of motion.  He exhibits no edema or tenderness.  Lymphadenopathy:    He has no cervical adenopathy.  Neurological: He is oriented to person, place, and time.  Skin: Skin is warm and dry. No rash noted. He is not diaphoretic. No erythema. No pallor.  Vitals reviewed.   Lab Results  Component Value Date   WBC 10.1 07/15/2015   HGB 15.9 07/15/2015   HCT 46.8 07/15/2015   PLT 262.0 07/15/2015   GLUCOSE 138* 07/15/2015   CHOL 231* 07/15/2015   TRIG  205.0* 07/15/2015   HDL 39.00* 07/15/2015   LDLDIRECT 183.0 07/15/2015   LDLCALC 196* 07/13/2014   ALT 55* 07/15/2015   AST 27 07/15/2015   NA 141 07/15/2015   K 3.9 07/15/2015   CL 104 07/15/2015   CREATININE 0.98 07/15/2015   BUN 15 07/15/2015   CO2 29 07/15/2015   TSH 1.46 07/15/2015   PSA 0.85 07/15/2015   INR 1.2 08/04/2008   HGBA1C 7.1* 07/15/2015   MICROALBUR 0.2 05/01/2012    Dg Chest 2 View  02/09/2014  CLINICAL DATA:  COPD.  Former smoker. EXAM: CHEST  2 VIEW COMPARISON:  None. FINDINGS: The heart size and mediastinal contours are within normal limits. Marked coarsened interstitial markings are identified bilaterally. No superimposed airspace consolidation identified. The visualized skeletal structures are unremarkable. IMPRESSION: 1. Chronic interstitial lung disease. 2. No acute cardiopulmonary abnormalities. Electronically Signed   By: Signa Kell M.D.   On: 02/09/2014 14:32    Assessment & Plan:   [name] was seen today for hypertension and diabetes.  Diagnoses and all orders for this visit:  Essential hypertension, benign- his BP is well controlled, I will consolidate his ARB/thiazide combo into Edarbyclor -     Azilsartan-Chlorthalidone (EDARBYCLOR) 40-25 MG TABS; Take 1 tablet by mouth daily.  Type 2 diabetes mellitus with complication, without long-term current use of insulin (HCC)- he does not need a medication for this, he will work on a weight loss program, I will recheck his A1C in about 4 months  Other orders -     Discontinue: hydrochlorothiazide (HYDRODIURIL) 25 MG tablet; Take 1 tablet (25 mg total) by mouth daily.  I have discontinued Mr. Virts hydrochlorothiazide, Azilsartan Medoxomil, and hydrochlorothiazide. I am also having him start on Azilsartan-Chlorthalidone. Additionally, I am having him maintain his aspirin, NON FORMULARY, fexofenadine, Red Yeast Rice Extract (RED YEAST RICE PO), fluticasone, Ipratropium-Albuterol, and ezetimibe.  Meds  ordered this encounter  Medications  . DISCONTD: hydrochlorothiazide (HYDRODIURIL) 25 MG tablet    Sig: Take 1 tablet (25 mg total) by mouth daily.    Dispense:  90 tablet    Refill:  1  . Azilsartan-Chlorthalidone (EDARBYCLOR) 40-25 MG TABS    Sig: Take 1 tablet by mouth daily.    Dispense:  90 tablet    Refill:  1     Follow-up: Return in about 4 months (around 01/12/2016).  Sanda Linger, MD

## 2015-09-16 DIAGNOSIS — J188 Other pneumonia, unspecified organism: Secondary | ICD-10-CM | POA: Diagnosis not present

## 2015-10-17 DIAGNOSIS — J188 Other pneumonia, unspecified organism: Secondary | ICD-10-CM | POA: Diagnosis not present

## 2015-11-17 DIAGNOSIS — J188 Other pneumonia, unspecified organism: Secondary | ICD-10-CM | POA: Diagnosis not present

## 2015-12-15 DIAGNOSIS — J188 Other pneumonia, unspecified organism: Secondary | ICD-10-CM | POA: Diagnosis not present

## 2016-01-12 ENCOUNTER — Ambulatory Visit (INDEPENDENT_AMBULATORY_CARE_PROVIDER_SITE_OTHER): Payer: Medicare Other | Admitting: Internal Medicine

## 2016-01-12 ENCOUNTER — Other Ambulatory Visit (INDEPENDENT_AMBULATORY_CARE_PROVIDER_SITE_OTHER): Payer: Medicare Other

## 2016-01-12 ENCOUNTER — Encounter: Payer: Self-pay | Admitting: Internal Medicine

## 2016-01-12 VITALS — BP 98/70 | HR 71 | Temp 98.0°F | Resp 16 | Ht 69.0 in | Wt 210.0 lb

## 2016-01-12 DIAGNOSIS — E785 Hyperlipidemia, unspecified: Secondary | ICD-10-CM

## 2016-01-12 DIAGNOSIS — Z23 Encounter for immunization: Secondary | ICD-10-CM | POA: Diagnosis not present

## 2016-01-12 DIAGNOSIS — I10 Essential (primary) hypertension: Secondary | ICD-10-CM

## 2016-01-12 DIAGNOSIS — E118 Type 2 diabetes mellitus with unspecified complications: Secondary | ICD-10-CM

## 2016-01-12 DIAGNOSIS — E781 Pure hyperglyceridemia: Secondary | ICD-10-CM

## 2016-01-12 DIAGNOSIS — Z1211 Encounter for screening for malignant neoplasm of colon: Secondary | ICD-10-CM | POA: Insufficient documentation

## 2016-01-12 LAB — URINALYSIS, ROUTINE W REFLEX MICROSCOPIC
BILIRUBIN URINE: NEGATIVE
HGB URINE DIPSTICK: NEGATIVE
LEUKOCYTES UA: NEGATIVE
NITRITE: NEGATIVE
RBC / HPF: NONE SEEN (ref 0–?)
Specific Gravity, Urine: <=1.005 — AB (ref 1.000–1.030)
TOTAL PROTEIN, URINE-UPE24: NEGATIVE
Urine Glucose: >=1000 — AB
Urobilinogen, UA: 0.2 (ref 0.0–1.0)
pH: 5.5 (ref 5.0–8.0)

## 2016-01-12 LAB — MICROALBUMIN / CREATININE URINE RATIO
Creatinine,U: 60.1 mg/dL
MICROALB/CREAT RATIO: 1.2 mg/g (ref 0.0–30.0)
Microalb, Ur: <0.7 mg/dL (ref 0.0–1.9)

## 2016-01-12 LAB — BASIC METABOLIC PANEL
BUN: 24 mg/dL — AB (ref 6–23)
CHLORIDE: 100 meq/L (ref 96–112)
CO2: 25 meq/L (ref 19–32)
CREATININE: 0.98 mg/dL (ref 0.40–1.50)
Calcium: 9.8 mg/dL (ref 8.4–10.5)
GFR: 82.36 mL/min (ref >60.00)
GLUCOSE: 309 mg/dL — AB (ref 70–99)
Potassium: 3.9 mEq/L (ref 3.5–5.1)
Sodium: 133 mEq/L — ABNORMAL LOW (ref 135–145)

## 2016-01-12 LAB — LIPID PANEL
Cholesterol: 276 mg/dL — ABNORMAL HIGH (ref 0–200)
HDL: 30.2 mg/dL — AB (ref >39.00)
Total CHOL/HDL Ratio: 9
Triglycerides: 438 mg/dL — ABNORMAL HIGH (ref 0.0–149.0)

## 2016-01-12 LAB — LDL CHOLESTEROL, DIRECT: LDL DIRECT: 131 mg/dL

## 2016-01-12 LAB — TSH: TSH: 1.23 u[IU]/mL (ref 0.35–4.50)

## 2016-01-12 LAB — HEMOGLOBIN A1C

## 2016-01-12 NOTE — Progress Notes (Signed)
Subjective:  Patient ID: [name], male    DOB: 07-23-1956  Age: 60 y.o. MRN: [id number]  CC: Hypertension; Hyperlipidemia; and Diabetes   HPI [name] presents for follow-up on diabetes, hypertension and hypercholesterolemia. He complains of increased thirst. His blood sugars have not been well controlled with numbers that are around 285, 290 and 210 throughout the day. With lifestyle modifications he has been able to lose 11 pounds.  Outpatient Prescriptions Prior to Visit  Medication Sig Dispense Refill  . aspirin 81 MG EC tablet Take 81 mg by mouth daily.      . Azilsartan-Chlorthalidone (EDARBYCLOR) 40-25 MG TABS Take 1 tablet by mouth daily. 90 tablet 1  . fexofenadine (ALLEGRA) 180 MG tablet Take 180 mg by mouth daily.      . fluticasone (FLONASE) 50 MCG/ACT nasal spray Place 2 sprays into both nostrils daily. 16 g 2  . Ipratropium-Albuterol (COMBIVENT RESPIMAT) 20-100 MCG/ACT AERS respimat Inhale 1 puff into the lungs every 6 (six) hours as needed for wheezing or shortness of breath. 3 Inhaler 1  . NON FORMULARY Oxygen continuous 1.5 liters with activity     . Red Yeast Rice Extract (RED YEAST RICE PO) Take 1 tablet by mouth daily.    Marland Kitchen ezetimibe (ZETIA) 10 MG tablet Take 1 tablet (10 mg total) by mouth daily. (Patient not taking: Reported on 01/12/2016) 90 tablet 3   No facility-administered medications prior to visit.    ROS Review of Systems  Constitutional: Negative.  Negative for fever, chills, diaphoresis, appetite change and fatigue.  HENT: Negative.   Eyes: Negative.  Negative for visual disturbance.  Respiratory: Positive for shortness of breath. Negative for cough, choking, chest tightness, wheezing and stridor.   Cardiovascular: Negative.  Negative for chest pain, palpitations and leg swelling.  Gastrointestinal: Negative.  Negative for nausea, vomiting, abdominal pain, diarrhea, constipation and blood in stool.  Endocrine: Positive for polydipsia.  Negative for polyphagia and polyuria.  Genitourinary: Negative.  Negative for difficulty urinating.  Musculoskeletal: Negative.  Negative for myalgias, back pain, joint swelling, arthralgias and neck pain.  Skin: Negative.  Negative for color change.  Allergic/Immunologic: Negative.   Neurological: Negative.  Negative for dizziness, tremors, speech difficulty, light-headedness, numbness and headaches.  Hematological: Negative.  Negative for adenopathy. Does not bruise/bleed easily.  Psychiatric/Behavioral: Negative.     Objective:  BP 98/70 mmHg  Pulse 71  Temp(Src) 98 F (36.7 C) (Oral)  Resp 16  Ht  (1.753 m)  Wt 210 lb (95.255 kg)  BMI 31.00 kg/m2  SpO2 95%  BP Readings from Last 3 Encounters:  01/12/16 98/70  09/14/15 128/86  07/15/15 143/100    Wt Readings from Last 3 Encounters:  01/12/16 210 lb (95.255 kg)  09/14/15 221 lb (100.245 kg)  07/15/15 220 lb (99.791 kg)    Physical Exam  Constitutional: He is oriented to person, place, and time. No distress.  HENT:  Mouth/Throat: Oropharynx is clear and moist. No oropharyngeal exudate.  Eyes: Conjunctivae are normal. Right eye exhibits no discharge. Left eye exhibits no discharge. No scleral icterus.  Neck: Normal range of motion. Neck supple. No JVD present. No tracheal deviation present. No thyromegaly present.  Cardiovascular: Normal rate, regular rhythm, normal heart sounds and intact distal pulses.  Exam reveals no gallop and no friction rub.   No murmur heard. Pulmonary/Chest: Effort normal and breath sounds normal. No stridor. No respiratory distress. He has no wheezes. He has no rales. He exhibits no tenderness.  Abdominal: Soft. Bowel sounds are normal. He exhibits no distension and no mass. There is no tenderness. There is no rebound and no guarding.  Musculoskeletal: Normal range of motion. He exhibits no edema or tenderness.  Lymphadenopathy:    He has no cervical adenopathy.  Neurological: He is  oriented to person, place, and time.  Skin: Skin is warm and dry. No rash noted. He is not diaphoretic. No erythema. No pallor.  Vitals reviewed.   Lab Results  Component Value Date   WBC 10.1 07/15/2015   HGB 15.9 07/15/2015   HCT 46.8 07/15/2015   PLT 262.0 07/15/2015   GLUCOSE 309* 01/12/2016   CHOL 276* 01/12/2016   TRIG * 01/12/2016    438.0 Triglyceride is over 400; calculations on Lipids are invalid.   HDL 30.20* 01/12/2016   LDLDIRECT 131.0 01/12/2016   LDLCALC 196* 07/13/2014   ALT 55* 07/15/2015   AST 27 07/15/2015   NA 133* 01/12/2016   K 3.9 01/12/2016   CL 100 01/12/2016   CREATININE 0.98 01/12/2016   BUN 24* 01/12/2016   CO2 25 01/12/2016   TSH 1.23 01/12/2016   PSA 0.85 07/15/2015   INR 1.2 08/04/2008   HGBA1C 10.9 Repeated and verified X2.* 01/12/2016   MICROALBUR <0.7 01/12/2016    Dg Chest 2 View  02/09/2014  CLINICAL DATA:  COPD.  Former smoker. EXAM: CHEST  2 VIEW COMPARISON:  None. FINDINGS: The heart size and mediastinal contours are within normal limits. Marked coarsened interstitial markings are identified bilaterally. No superimposed airspace consolidation identified. The visualized skeletal structures are unremarkable. IMPRESSION: 1. Chronic interstitial lung disease. 2. No acute cardiopulmonary abnormalities. Electronically Signed   By: Signa Kellaylor  Stroud M.D.   On: 02/09/2014 14:32    Assessment & Plan:   [name] was seen today for hypertension, hyperlipidemia and diabetes.  Diagnoses and all orders for this visit:  Essential hypertension, benign- his blood pressure is adequately well-controlled, lytes and renal function are stable. -     Basic metabolic panel; Future -     Urinalysis, Routine w reflex microscopic (not at [facility]); Future  Type 2 diabetes mellitus with complication, without long-term current use of insulin (HCC)- his A1c is up to 10.9% and he has a C-peptide level in the upper range of normal so I think the main cause for his  diabetes is insulin resistance, I initially prescribed Jentadueto but was told that it was too expensive so I have I therefore asked him to start metformin and pioglitazone. I've also asked him to see endocrinology for further evaluation of this and to get his annual eye exam done. -     Basic metabolic panel; Future -     TSH; Future -     Microalbumin / creatinine urine ratio; Future -     C-peptide; Future -     Ambulatory referral to Ophthalmology -     Ambulatory referral to Endocrinology -     Discontinue: Linagliptin-Metformin HCl ER (JENTADUETO XR) 2.02-999 MG TB24; Take 2 tablets by mouth daily. -     metFORMIN (GLUCOPHAGE) 1000 MG tablet; Take 1 tablet (1,000 mg total) by mouth 2 (two) times daily with a meal. -     pioglitazone (ACTOS) 15 MG tablet; Take 1 tablet (15 mg total) by mouth daily.  Pure hyperglyceridemia- his triglycerides are up to 438 so I've asked him to start taking and omega-3 fish oil treat this. -     Lipid panel; Future  Hyperlipidemia with target  LDL less than 130- zetia is too expensive and he refuses to take a statin, will treat his high triglyceride level. -     Lipid panel; Future -     Hemoglobin A1c; Future  Screen for colon cancer -     Ambulatory referral to Gastroenterology  Need for Tdap vaccination -     Tdap vaccine greater than or equal to 7yo IM  I have discontinued Mr. Deroos ezetimibe and Linagliptin-Metformin HCl ER. I am also having him start on metFORMIN and pioglitazone. Additionally, I am having him maintain his aspirin, NON FORMULARY, fexofenadine, Red Yeast Rice Extract (RED YEAST RICE PO), fluticasone, Ipratropium-Albuterol, and Azilsartan-Chlorthalidone.  Meds ordered this encounter  Medications  . DISCONTD: Linagliptin-Metformin HCl ER (JENTADUETO XR) 2.02-999 MG TB24    Sig: Take 2 tablets by mouth daily.    Dispense:  60 tablet    Refill:  5  . metFORMIN (GLUCOPHAGE) 1000 MG tablet    Sig: Take 1 tablet (1,000 mg total) by  mouth 2 (two) times daily with a meal.    Dispense:  180 tablet    Refill:  1  . pioglitazone (ACTOS) 15 MG tablet    Sig: Take 1 tablet (15 mg total) by mouth daily.    Dispense:  90 tablet    Refill:  1     Follow-up: Return in about 4 months (around 05/13/2016).  Sanda Linger, MD

## 2016-01-12 NOTE — Progress Notes (Signed)
Pre visit review using our clinic review tool, if applicable. No additional management support is needed unless otherwise documented below in the visit note. 

## 2016-01-12 NOTE — Patient Instructions (Signed)

## 2016-01-13 ENCOUNTER — Encounter: Payer: Self-pay | Admitting: Internal Medicine

## 2016-01-13 LAB — C-PEPTIDE: C-Peptide: 3.75 ng/mL (ref 0.80–3.85)

## 2016-01-13 MED ORDER — LINAGLIPTIN-METFORMIN HCL ER 2.5-1000 MG PO TB24: 2.0000 | ORAL_TABLET | Freq: Every day | ORAL | Status: DC

## 2016-01-14 ENCOUNTER — Telehealth: Payer: Self-pay | Admitting: *Deleted

## 2016-01-14 MED ORDER — PIOGLITAZONE HCL 15 MG PO TABS: 15.0000 mg | ORAL_TABLET | Freq: Every day | ORAL | Status: DC

## 2016-01-14 MED ORDER — METFORMIN HCL 1000 MG PO TABS: 1000.0000 mg | ORAL_TABLET | Freq: Two times a day (BID) | ORAL | Status: DC

## 2016-01-14 NOTE — Telephone Encounter (Signed)
Receive call pt states Dr. Yetta BarreJones sent a new diabetes medication (Jentadueto) med cost over $400, and he can not afford. Requesting md to rx something else cheaper. Pls send to [facility]. Inform pt md out of the office will be Monday when he address msg...Raechel Chute/lmb

## 2016-01-15 DIAGNOSIS — J188 Other pneumonia, unspecified organism: Secondary | ICD-10-CM | POA: Diagnosis not present

## 2016-01-16 MED ORDER — ICOSAPENT ETHYL 1 G PO CAPS: 2.0000 | ORAL_CAPSULE | Freq: Two times a day (BID) | ORAL | Status: DC

## 2016-01-17 NOTE — Telephone Encounter (Signed)
Notified pt w/MD response.../lmb 

## 2016-01-17 NOTE — Telephone Encounter (Signed)
Pls advise on msg below.../lmb 

## 2016-01-17 NOTE — Telephone Encounter (Signed)
This has been taken care of A generic option was sent to his pharmacy Also note that his triglycerides were very high so I sent a fish oil supplement to his pharmacy to lower his triglyceride levels.

## 2016-01-18 ENCOUNTER — Telehealth: Payer: Self-pay | Admitting: *Deleted

## 2016-01-18 ENCOUNTER — Other Ambulatory Visit: Payer: Self-pay | Admitting: *Deleted

## 2016-01-18 DIAGNOSIS — E781 Pure hyperglyceridemia: Secondary | ICD-10-CM

## 2016-01-18 MED ORDER — OMEGA-3-ACID ETHYL ESTERS 1 G PO CAPS: 2.0000 g | ORAL_CAPSULE | Freq: Two times a day (BID) | ORAL | Status: DC

## 2016-01-18 NOTE — Telephone Encounter (Signed)
Notified pt md sent alternative med to pharmacy...Raechel Chute/lmb

## 2016-01-18 NOTE — Telephone Encounter (Signed)
changed

## 2016-01-18 NOTE — Telephone Encounter (Signed)
Received call pt states the supplement md sent in yesterday his insurance does not cover & he can't afford med. Wanting to know is there a cheaper alternative he can take...Raechel Chute/lmb

## 2016-02-04 ENCOUNTER — Encounter: Payer: Medicare Other | Attending: Endocrinology | Admitting: Dietician

## 2016-02-04 ENCOUNTER — Ambulatory Visit (INDEPENDENT_AMBULATORY_CARE_PROVIDER_SITE_OTHER): Payer: Medicare Other | Admitting: Endocrinology

## 2016-02-04 ENCOUNTER — Encounter: Payer: Self-pay | Admitting: Dietician

## 2016-02-04 ENCOUNTER — Encounter: Payer: Self-pay | Admitting: Endocrinology

## 2016-02-04 VITALS — Ht 69.0 in | Wt 213.0 lb

## 2016-02-04 VITALS — BP 116/80 | HR 100 | Temp 98.5°F | Ht 69.0 in | Wt 213.0 lb

## 2016-02-04 DIAGNOSIS — E118 Type 2 diabetes mellitus with unspecified complications: Secondary | ICD-10-CM | POA: Diagnosis not present

## 2016-02-04 DIAGNOSIS — E1165 Type 2 diabetes mellitus with hyperglycemia: Secondary | ICD-10-CM

## 2016-02-04 DIAGNOSIS — IMO0002 Reserved for concepts with insufficient information to code with codable children: Secondary | ICD-10-CM

## 2016-02-04 MED ORDER — PIOGLITAZONE HCL 45 MG PO TABS: 45.0000 mg | ORAL_TABLET | Freq: Every day | ORAL | Status: DC

## 2016-02-04 MED ORDER — GLUCOSE BLOOD VI STRP: 1.0000 | ORAL_STRIP | Freq: Every day | Status: DC

## 2016-02-04 NOTE — Progress Notes (Signed)
Subjective:    Patient ID: [name], male    DOB: 1956-03-09, 60 y.o.   MRN: [id number]  HPI pt states DM was dx'ed in 2007; he has mild neuropathy of the lower extremities, but no associated chronic complications; he has never been on insulin (except while in the hospital on steroids); pt says his diet is good, but exercise is limited by lung dz; he has never had pancreatitis, severe hypoglycemia or DKA.  He has been on meds x a few weeks only.   Past Medical History  Diagnosis Date  . GERD (gastroesophageal reflux disease)   . Depression   . ILD (interstitial lung disease) ([place])   . Panic attack   . Dyslipidemia   . Hypertension     Echo 10/09 EF 60%  . Dyslipidemia   . COPD (chronic obstructive pulmonary disease) ([place]) 09/23/08    -PFT 09/23/08 FEV1 2.89(84%), FVC 3.70 (78%), TLC 5.28 (78%), DLCO 64% mixed obstructive and  restrictive pattern.  Spirometry 03/11/09 FEV 3.35 (97%),FVC 4.10 (86%), FEV% 82. Steroid responsive pneumonitis 04/09- required mechanical ventilation during hospitalization - The following hospital test were negative: PCP,ANA,HIV,BAL. - ACE25,RF20,CRP 28,A1AT 263, ESR 109  . History of skin cancer   . Diabetes mellitus without complication [facility])     Past Surgical History  Procedure Laterality Date  . Hernia repair      2 hernia  . Left shoulder surgery    . Ganglion cyst on foot    . Bronchoscopy  10/09    Social History   Social History  . Marital Status: Divorced    Spouse Name: N/A  . Number of Children: N/A  . Years of Education: N/A   Occupational History  . Theme park manager    Social History Main Topics  . Smoking status: Former Smoker -- 1.00 packs/day for 16 years    Types: Cigarettes    Quit date: 10/17/2003  . Smokeless tobacco: Never Used  . Alcohol Use: No  . Drug Use: No  . Sexual Activity: Not Currently   Other Topics Concern  . Not on file   Social History Narrative   Divorced. No Children   Drug use-No   Regular  exercise-yes   Lives alone    Current Outpatient Prescriptions on File Prior to Visit  Medication Sig Dispense Refill  . aspirin 81 MG EC tablet Take 81 mg by mouth daily.      . Azilsartan-Chlorthalidone (EDARBYCLOR) 40-25 MG TABS Take 1 tablet by mouth daily. 90 tablet 1  . fexofenadine (ALLEGRA) 180 MG tablet Take 180 mg by mouth daily.      . fluticasone (FLONASE) 50 MCG/ACT nasal spray Place 2 sprays into both nostrils daily. 16 g 2  . Ipratropium-Albuterol (COMBIVENT RESPIMAT) 20-100 MCG/ACT AERS respimat Inhale 1 puff into the lungs every 6 (six) hours as needed for wheezing or shortness of breath. 3 Inhaler 1  . metFORMIN (GLUCOPHAGE) 1000 MG tablet Take 1 tablet (1,000 mg total) by mouth 2 (two) times daily with a meal. 180 tablet 1  . NON FORMULARY Oxygen continuous 1.5 liters with activity     . omega-3 acid ethyl esters (LOVAZA) 1 g capsule Take 2 capsules (2 g total) by mouth 2 (two) times daily. 120 capsule 11  . Red Yeast Rice Extract (RED YEAST RICE PO) Take 1 tablet by mouth daily.     No current facility-administered medications on file prior to visit.    Allergies  Allergen Reactions  .  Latex   . Morphine   . Statins     REACTION: weakness, muscle pains    Family History  Problem Relation Age of Onset  . Heart attack Father 62  . Heart attack Brother   . Emphysema Brother   . Diabetes Brother   . Diabetes Father     BP 116/80 mmHg  Pulse 100  Temp(Src) 98.5 F (36.9 C) (Oral)  Ht _0  (1.753 m)  Wt 213 lb (96.616 kg)  BMI 31.44 kg/m2  SpO2 96%    Review of Systems denies weight loss, blurry vision, headache, chest pain, n/v, urinary frequency, excessive diaphoresis, memory loss, cold intolerance, and easy bruising.  He has chronic doe, rhinorrhea, and leg cramps.       Objective:   Physical Exam VS: see vs page GEN: no distress.  Has 02 on HEAD: head: no deformity eyes: no periorbital swelling, no proptosis external nose and ears are  normal mouth: no lesion seen NECK: supple, thyroid is not enlarged CHEST WALL: no deformity LUNGS: clear to auscultation CV: reg rate and rhythm, no murmur ABD: abdomen is soft, nontender.  no hepatosplenomegaly.  not distended.  no hernia MUSCULOSKELETAL: muscle bulk and strength are grossly normal.  no obvious joint swelling.  gait is normal and steady EXTEMITIES: no deformity.  no ulcer on the feet.  feet are of normal color and temp.  no edema PULSES: dorsalis pedis intact bilat.  no carotid bruit NEURO:  cn 2-12 grossly intact.   readily moves all 4's.  sensation is intact to touch on the feet SKIN:  Normal texture and temperature.  No rash or suspicious lesion is visible.   NODES:  None palpable at the neck PSYCH: alert, well-oriented.  Does not appear anxious nor depressed.     Lab Results  Component Value Date   HGBA1C 10.9 Repeated and verified X2.* 01/12/2016   I personally reviewed electrocardiogram tracing (07/15/15): Indication:  Impression: normal  I have reviewed outside records, and summarized: Pt was noted to have severely elevated a1c, and referred here.     Assessment & Plan:  DM: severe exacerbation.  He probably needs insulin. pulm fibrosis: new to me.  As he may need to go back on steroids, I also have him learn insulin injections at a future ov here.  Patient is advised the following: Patient Instructions  good diet and exercise significantly improve the control of your diabetes.  please let me know if you wish to be referred to a dietician.  high blood sugar is very risky to your health.  you should see an eye doctor and dentist every year.  It is very important to get all recommended vaccinations.  controlling your blood pressure and cholesterol drastically reduces the damage diabetes does to your body.  Those who smoke should quit.  please discuss these with your doctor.  check your blood sugar once a day.  vary the time of day when you check, between  before the 3 meals, and at bedtime.  also check if you have symptoms of your blood sugar being too high or too low.  please keep a record of the readings and bring it to your next appointment here (or you can bring the meter itself).  You can write it on any piece of paper.  please call us sooner if your blood sugar goes below 70, or if you have a lot of readings over 200.   You may need additional medication, so please call  if the blood sugar goes over 200.   Please come back for a follow-up appointment in 3 months.

## 2016-02-04 NOTE — Patient Instructions (Signed)
Plan:  Aim for 3 Carb Choices per meal (45 grams) +/- 1 either way  Aim for 0-1 Carbs per snack if hungry  Include protein (beans,dairy, meat) in moderation with your meals and snacks Consider reading food labels for Total Carbohydrate and Fat Grams of foods Consider  increasing your activity level by walking or arm chair exercises for 10 minutes daily as tolerated Consider checking BG at alternate times per day as directed by MD  Consider taking medication as directed by MD  Be sure to have a light meal or snack in the middle of the day. Consider supplement such as Glucerna or Boost Glucose Control.

## 2016-02-04 NOTE — Patient Instructions (Addendum)
good diet and exercise significantly improve the control of your diabetes.  please let me know if you wish to be referred to a dietician.  high blood sugar is very risky to your health.  you should see an eye doctor and dentist every year.  It is very important to get all recommended vaccinations.  controlling your blood pressure and cholesterol drastically reduces the damage diabetes does to your body.  Those who smoke should quit.  please discuss these with your doctor.  check your blood sugar once a day.  vary the time of day when you check, between before the 3 meals, and at bedtime.  also check if you have symptoms of your blood sugar being too high or too low.  please keep a record of the readings and bring it to your next appointment here (or you can bring the meter itself).  You can write it on any piece of paper.  please call us sooner if your blood sugar goes below 70, or if you have a lot of readings over 200.   You may need additional medication, so please call if the blood sugar goes over 200.   Please come back for a follow-up appointment in 3 months.

## 2016-02-04 NOTE — Progress Notes (Signed)
Diabetes Self-Management Education  Visit Type: First/Initial  Appt. Start Time: 1500 Appt. End Time: 1600  02/04/2016  [name], identified by name and date of birth, is a 60 y.o. male with a diagnosis of Diabetes: Type 2. Other hx includes COPD on chronic Oxygen, HTN, GERD, dyslipidemia.  Labs noted 01/12/16 cholesterol 276, Triglycerides 430, HDL 30, LDL 130.  Patient lives alone. His brother and sister in law live close by and are very supportive.  He only eats twice per day and overall diet is deficient in protein many days.  He is on disability.  He is unable to exercise much due to dyspnea.  ASSESSMENT  Height 5\' 9"  (1.753 m), weight 213 lb (96.616 kg). Body mass index is 31.44 kg/(m^2).      Diabetes Self-Management Education - 02/04/16 1505    Visit Information   Visit Type First/Initial   Initial Visit   Diabetes Type Type 2   Are you currently following a meal plan? No   Are you taking your medications as prescribed? Yes   Date Diagnosed 06/2015   Health Coping   How would you rate your overall health? Fair   Psychosocial Assessment   Patient Belief/Attitude about Diabetes Other (comment)  Not sure about anything yet   Self-care barriers None   Self-management support Doctor's office   Other persons present Patient;Family Member  brother and sister in law   Patient Concerns Nutrition/Meal planning;Glycemic Control   Special Needs None   Preferred Learning Style No preference indicated   Learning Readiness Ready   How often do you need to have someone help you when you read instructions, pamphlets, or other written materials from your doctor or pharmacy? 1 - Never   What is the last grade level you completed in school? 12 years   Complications   Last HgB A1C per patient/outside source 10.9 %  01/12/16 increased from 7.1% 07/14/16   How often do you check your blood sugar? --  occasionally.  last time checked 146 fasting   Fasting Blood glucose range (mg/dL)  [id number]   Number of hypoglycemic episodes per month 0   Number of hyperglycemic episodes per week 0   Have you had a dilated eye exam in the past 12 months? No   Have you had a dental exam in the past 12 months? No   Are you checking your feet? Yes   How many days per week are you checking your feet? 1   Dietary Intake   Breakfast cornflakes strawberries, 2% milk coffee with 2% milk OR scrambled eggs and sausage, white wheat toast, jelly  12-1   Snack (morning) none   Lunch none   Snack (afternoon) none   Dinner salad with tuna, orange OR salad with strawberries, banana feta cheese OR seafood, salad, and steamed veges once per week  7-9   Snack (evening) occasional apple or orange or nuts   Beverage(s) 2% milk, water, regular gingerale, or diet cranberry juice, v-8 juice (tomato)   Exercise   Exercise Type ADL's   How many days per week to you exercise? 0   How many minutes per day do you exercise? 0   Total minutes per week of exercise 0   Patient Education   Previous Diabetes Education No   Disease state  Definition of diabetes, type 1 and 2, and the diagnosis of diabetes;Factors that contribute to the development of diabetes;Explored patient's options for treatment of their diabetes   Nutrition management  Role of diet in the treatment of diabetes and the relationship between the three main macronutrients and blood glucose level;Food label reading, portion sizes and measuring food.;Meal options for control of blood glucose level and chronic complications.   Physical activity and exercise  Role of exercise on diabetes management, blood pressure control and cardiac health.;Helped patient identify appropriate exercises in relation to his/her diabetes, diabetes complications and other health issue.   Chronic complications Relationship between chronic complications and blood glucose control;Retinopathy and reason for yearly dilated eye exams;Dental care   Psychosocial adjustment Worked with  patient to identify barriers to care and solutions;Role of stress on diabetes;Brainstormed with patient on coping mechanisms for social situations, getting support from significant others, dealing with feelings about diabetes   Individualized Goals (developed by patient)   Nutrition General guidelines for healthy choices and portions discussed   Physical Activity Exercise 3-5 times per week;15 minutes per day   Medications take my medication as prescribed   Monitoring  test my blood glucose as discussed   Reducing Risk do foot checks daily   Outcomes   Future DMSE 3-4 months   Program Status Completed      Individualized Plan for Diabetes Self-Management Training:   Learning Objective:  Patient will have a greater understanding of diabetes self-management. Patient education plan is to attend individual and/or group sessions per assessed needs and concerns.   Plan:   Patient Instructions  Plan:  Aim for 3 Carb Choices per meal (45 grams) +/- 1 either way  Aim for 0-1 Carbs per snack if hungry  Include protein (beans,dairy, meat) in moderation with your meals and snacks Consider reading food labels for Total Carbohydrate and Fat Grams of foods Consider  increasing your activity level by walking or arm chair exercises for 10 minutes daily as tolerated Consider checking BG at alternate times per day as directed by MD  Consider taking medication as directed by MD  Be sure to have a light meal or snack in the middle of the day. Consider supplement such as Glucerna or Boost Glucose Control.    Expected Outcomes:   Patient showed interest in learning.  Good compliance expected but some challenge due to health issues.  Education material provided: Living Well with Diabetes, Food label handouts, A1C conversion sheet, Meal plan card, My Plate and Snack sheet  If problems or questions, patient to contact team via:  Phone and Email  Future DSME appointment: 3-4 months

## 2016-02-09 ENCOUNTER — Encounter: Payer: Self-pay | Admitting: Pulmonary Disease

## 2016-02-09 ENCOUNTER — Ambulatory Visit (INDEPENDENT_AMBULATORY_CARE_PROVIDER_SITE_OTHER): Payer: Medicare Other | Admitting: Pulmonary Disease

## 2016-02-09 VITALS — BP 120/64 | HR 93 | Ht 69.0 in | Wt 215.0 lb

## 2016-02-09 DIAGNOSIS — J849 Interstitial pulmonary disease, unspecified: Secondary | ICD-10-CM

## 2016-02-09 DIAGNOSIS — J439 Emphysema, unspecified: Secondary | ICD-10-CM

## 2016-02-09 DIAGNOSIS — J9611 Chronic respiratory failure with hypoxia: Secondary | ICD-10-CM

## 2016-02-09 NOTE — Patient Instructions (Signed)
Follow up in 1 year.

## 2016-02-09 NOTE — Progress Notes (Signed)
Current Outpatient Prescriptions on File Prior to Visit  Medication Sig  . aspirin 81 MG EC tablet Take 81 mg by mouth daily.    . Azilsartan-Chlorthalidone (EDARBYCLOR) 40-25 MG TABS Take 1 tablet by mouth daily.  . fexofenadine (ALLEGRA) 180 MG tablet Take 180 mg by mouth daily.    . fluticasone (FLONASE) 50 MCG/ACT nasal spray Place 2 sprays into both nostrils daily.  Marland Kitchen. glucose blood (ONETOUCH VERIO) test strip 1 each by Other route daily. And lancets 1/day  . Ipratropium-Albuterol (COMBIVENT RESPIMAT) 20-100 MCG/ACT AERS respimat Inhale 1 puff into the lungs every 6 (six) hours as needed for wheezing or shortness of breath.  . metFORMIN (GLUCOPHAGE) 1000 MG tablet Take 1 tablet (1,000 mg total) by mouth 2 (two) times daily with a meal.  . NON FORMULARY Oxygen continuous 1.5 liters with activity   . omega-3 acid ethyl esters (LOVAZA) 1 g capsule Take 2 capsules (2 g total) by mouth 2 (two) times daily. (Patient taking differently: Take 1 g by mouth 2 (two) times daily. )  . pioglitazone (ACTOS) 45 MG tablet Take 1 tablet (45 mg total) by mouth daily.  . Red Yeast Rice Extract (RED YEAST RICE PO) Take 1 tablet by mouth daily.   No current facility-administered medications on file prior to visit.    Chief Complaint  Patient presents with  . Follow-up    Pt states that breathing has been okay since last OV - no current issues with SOB/cough/wheezing. Pt reports having allergy symptoms with pollen and weather change.     Tests A1AT 08/12/08 >> A1AT 263 PFT 09/23/08 >> FEV1 2.89 (84%), FEV1% 78, TLC 5.28 (78%), DLCO 64% PFT 08/23/11 >> FEV1 3.68(110%), FEV1% 75, TLC 5.38(80%), DLCO 60%, no BD Spirometry 02/05/13 >> FEV1 3.62 (97%), FEV1% 79 PFT 03/12/14 >> FEV1 3.68 (109%), FEV1% 81, TLC 5.40 (81%), DLCO 71%, no BD  Past medical history GERD, Depression, HLD, HTN, DM  PSHx, Medications, Allergies, Fhx, Shx reviewed.  Vital signs BP 120/64 mmHg  Pulse 93  Ht 5\' 9"  (1.753 m)  Wt 215  lb (97.523 kg)  BMI 31.74 kg/m2  SpO2 97%  History of Present Illness: [name] is a 60 y.o. male former smoker with Emphysema and exertional/nocturnal hypoxemia.  Has hx of steroid responsive pneumonitis in 2009.  Since his last visit he was dx with DM.  His breathing has been stable.  He uses combivent a few times per month.  He is not having cough, wheeze, sputum, fever, chest pain, skin rash, or leg swelling.  He is keeping up with his activities w/o too much difficulty if he goes at steady pace.  He continues to use 1.5 liters oxygen.   Physical Exam:  General - No distress, wearing oxygen ENT - No sinus tenderness, no oral exudate, no LAN Cardiac - s1s2 regular, no murmur Chest - faint basilar crackles b/l, no wheeze Back - No focal tenderness Abd - Soft, non-tender Ext - No edema Neuro - Normal strength Skin - No rashes Psych - normal mood, and behavior  Discussion: His respiratory disease symptoms have been stable for several years.  We discussed whether he should have follow up testing (CXR, PFT) to monitor for disease progression.  He did not feel this was necessary at this time.  Assessment/Plan:  Emphysema. - continue PRN combivent  Hx of steroid responsive ILD >> stable - continue clinical monitoring  Chronic respiratory failure with hypoxia - continue 1.5 liters oxygen with exertion  Patient Instructions  Follow up in 1 year    Coralyn Helling, MD [facility] Pager:  [phone] 02/09/2016, 2:35 PM

## 2016-02-14 DIAGNOSIS — J188 Other pneumonia, unspecified organism: Secondary | ICD-10-CM | POA: Diagnosis not present

## 2016-03-08 ENCOUNTER — Encounter: Payer: Self-pay | Admitting: Gastroenterology

## 2016-03-08 ENCOUNTER — Ambulatory Visit (INDEPENDENT_AMBULATORY_CARE_PROVIDER_SITE_OTHER): Payer: Medicare Other | Admitting: Gastroenterology

## 2016-03-08 VITALS — BP 92/70 | HR 68 | Ht 69.0 in | Wt 216.0 lb

## 2016-03-08 DIAGNOSIS — Z1211 Encounter for screening for malignant neoplasm of colon: Secondary | ICD-10-CM | POA: Diagnosis not present

## 2016-03-08 NOTE — Progress Notes (Signed)
HPI :  60 y/o male here to discuss colon cancer screening. He is a new patient to our practice. He thinks his colonoscopy was 10-11 years ago. He reports it was normal without polyps, told to come back in 10 years. He thinks his grandmother had colon cancer, but no other relatives with colon cancer. He does not have blood in the stools, perhaps rare blood noted on the toilet paper with wiping he thinks due to longstanding hemorrhoids.  He has been on metformin for DM, and has noted some increased frequency on that regimen but no diarrhea. No abdominal pains. Weight is stable.   He is followed by pulmonary for COPD and ILD. He uses oxygen during the day and at night. He has some baseline dyspnea with exertion. He denies chest pains. He has no history of anemia.    Past Medical History  Diagnosis Date  . GERD (gastroesophageal reflux disease)   . Depression   . ILD (interstitial lung disease) ([place])   . Panic attack   . Dyslipidemia   . Hypertension     Echo 10/09 EF 60%  . Dyslipidemia   . COPD (chronic obstructive pulmonary disease) ([place]) 09/23/08    -PFT 09/23/08 FEV1 2.89(84%), FVC 3.70 (78%), TLC 5.28 (78%), DLCO 64% mixed obstructive and  restrictive pattern.  Spirometry 03/11/09 FEV 3.35 (97%),FVC 4.10 (86%), FEV% 82. Steroid responsive pneumonitis 04/09- required mechanical ventilation during hospitalization - The following hospital test were negative: PCP,ANA,HIV,BAL. - ACE25,RF20,CRP 28,A1AT 263, ESR 109  . History of skin cancer   . Diabetes mellitus without complication [facility])      Past Surgical History  Procedure Laterality Date  . Hernia repair      2 hernia  . Left shoulder surgery    . Ganglion cyst on foot    . Bronchoscopy  10/09   Family History  Problem Relation Age of Onset  . Heart attack Father 36  . Heart attack Brother   . Emphysema Brother   . Diabetes Brother   . Diabetes Father    Social History  Substance Use Topics  . Smoking status: Former  Smoker -- 1.00 packs/day for 16 years    Types: Cigarettes    Quit date: 10/17/2003  . Smokeless tobacco: Never Used  . Alcohol Use: No   Current Outpatient Prescriptions  Medication Sig Dispense Refill  . aspirin 81 MG EC tablet Take 81 mg by mouth daily.      . Azilsartan-Chlorthalidone (EDARBYCLOR) 40-25 MG TABS Take 1 tablet by mouth daily. 90 tablet 1  . fexofenadine (ALLEGRA) 180 MG tablet Take 180 mg by mouth daily.      . fluticasone (FLONASE) 50 MCG/ACT nasal spray Place 2 sprays into both nostrils daily. 16 g 2  . glucose blood (ONETOUCH VERIO) test strip 1 each by Other route daily. And lancets 1/day 100 each 12  . Ipratropium-Albuterol (COMBIVENT RESPIMAT) 20-100 MCG/ACT AERS respimat Inhale 1 puff into the lungs every 6 (six) hours as needed for wheezing or shortness of breath. 3 Inhaler 1  . metFORMIN (GLUCOPHAGE) 1000 MG tablet Take 1 tablet (1,000 mg total) by mouth 2 (two) times daily with a meal. 180 tablet 1  . NON FORMULARY Oxygen continuous 1.5 liters with activity     . omega-3 acid ethyl esters (LOVAZA) 1 g capsule Take 2 capsules (2 g total) by mouth 2 (two) times daily. (Patient taking differently: Take 1 g by mouth 2 (two) times daily. ) 120 capsule 11  .  pioglitazone (ACTOS) 45 MG tablet Take 1 tablet (45 mg total) by mouth daily. 90 tablet 3  . Red Yeast Rice Extract (RED YEAST RICE PO) Take 1 tablet by mouth daily. Reported on 03/08/2016     No current facility-administered medications for this visit.   Allergies  Allergen Reactions  . Latex   . Morphine   . Statins     REACTION: weakness, muscle pains     Review of Systems: All systems reviewed and negative except where noted in HPI.   Lab Results  Component Value Date   WBC 10.1 07/15/2015   HGB 15.9 07/15/2015   HCT 46.8 07/15/2015   MCV 89.5 07/15/2015   PLT 262.0 07/15/2015    Lab Results  Component Value Date   CREATININE 0.98 01/12/2016   BUN 24* 01/12/2016   NA 133* 01/12/2016   K  3.9 01/12/2016   CL 100 01/12/2016   CO2 25 01/12/2016       Physical Exam: BP 92/70 mmHg  Pulse 68  Ht _0  (1.753 m)  Wt 216 lb (97.977 kg)  BMI 31.88 kg/m2 Constitutional: Pleasant,well-developed, male in no acute distress. HEENT: Normocephalic and atraumatic. Conjunctivae are normal. No scleral icterus. Neck supple.  Cardiovascular: Normal rate, regular rhythm.  Pulmonary/chest: Effort normal and mildly decreased BS B. No obvious wheezing. Abdominal: Soft, nondistended, nontender. Bowel sounds active throughout. There are no masses palpable. No hepatomegaly. Extremities: no edema Lymphadenopathy: No cervical adenopathy noted. Neurological: Alert and oriented to person place and time. Skin: Skin is warm and dry. No rashes noted. Psychiatric: Normal mood and affect. Behavior is normal.   ASSESSMENT AND PLAN: 60 y/o male with history of oxygen dependant COPD and ILD, here to discuss colon cancer screening. He has stable bowel symptoms for years, no anemia, no significant family history of colon cancer. He has some baseline mild hemorrhoid symptoms. I discussed screening options including optical colonoscopy and stool based testing. Following our conversation, given his history of lung disease I think a noninvasive modality for screening would be preferable for him initially. In this light, he wished to proceed with Cologuard. If this test is positive, we will coordinate a colonoscopy to be done at the hospital with anesthesia support. If negative, he would not warrant repeat screening for another 3 years. He agreed with the plan. Otherwise, noted to have mildly low BP today but no symptoms from it. He has a BP cuff at home, recommend he check it and if it remains low he should hold his BP medication and follow up with his primary care.   [place] Cellar, MD [facility] Pager [phone]

## 2016-03-08 NOTE — Patient Instructions (Signed)
We have sent your demographic and insurance information to [facility]. They should contact you within the next week regarding your Cologuard (colon cancer screening) test. If you have not heard from them within the next week, please call our office at [phone].  Please have your blood pressure rechecked again to make sure it does not remain low.  If you are age 865 or older, your body mass index should be between 23-30. Your Body mass index is 31.88 kg/(m^2). If this is out of the aforementioned range listed, please consider follow up with your Primary Care Provider.  If you are age 62 or younger, your body mass index should be between 19-25. Your Body mass index is 31.88 kg/(m^2). If this is out of the aformentioned range listed, please consider follow up with your Primary Care Provider.

## 2016-03-16 DIAGNOSIS — J188 Other pneumonia, unspecified organism: Secondary | ICD-10-CM | POA: Diagnosis not present

## 2016-03-21 DIAGNOSIS — Z1212 Encounter for screening for malignant neoplasm of rectum: Secondary | ICD-10-CM | POA: Diagnosis not present

## 2016-03-21 DIAGNOSIS — Z1211 Encounter for screening for malignant neoplasm of colon: Secondary | ICD-10-CM | POA: Diagnosis not present

## 2016-04-07 ENCOUNTER — Other Ambulatory Visit: Payer: Self-pay

## 2016-04-07 LAB — COLOGUARD: Cologuard: NEGATIVE

## 2016-04-10 ENCOUNTER — Encounter: Payer: Self-pay | Admitting: Gastroenterology

## 2016-04-15 DIAGNOSIS — J188 Other pneumonia, unspecified organism: Secondary | ICD-10-CM | POA: Diagnosis not present

## 2016-05-04 ENCOUNTER — Encounter: Payer: Medicare Other | Attending: Endocrinology | Admitting: Dietician

## 2016-05-04 ENCOUNTER — Encounter: Payer: Self-pay | Admitting: Endocrinology

## 2016-05-04 ENCOUNTER — Ambulatory Visit (INDEPENDENT_AMBULATORY_CARE_PROVIDER_SITE_OTHER): Payer: Medicare Other | Admitting: Endocrinology

## 2016-05-04 VITALS — BP 116/70 | HR 96 | Temp 98.0°F | Wt 221.5 lb

## 2016-05-04 DIAGNOSIS — E118 Type 2 diabetes mellitus with unspecified complications: Secondary | ICD-10-CM | POA: Diagnosis not present

## 2016-05-04 DIAGNOSIS — E119 Type 2 diabetes mellitus without complications: Secondary | ICD-10-CM

## 2016-05-04 LAB — POCT GLYCOSYLATED HEMOGLOBIN (HGB A1C): HEMOGLOBIN A1C: 5.9

## 2016-05-04 MED ORDER — PIOGLITAZONE HCL 30 MG PO TABS: 30.0000 mg | ORAL_TABLET | Freq: Every day | ORAL | Status: DC

## 2016-05-04 NOTE — Patient Instructions (Addendum)
check your blood sugar once a day.  vary the time of day when you check, between before the 3 meals, and at bedtime.  also check if you have symptoms of your blood sugar being too high or too low.  please keep a record of the readings and bring it to your next appointment here (or you can bring the meter itself).  You can write it on any piece of paper.  please call us sooner if your blood sugar goes below 70, or if you have a lot of readings over 200.   Please reduce the pioglitizone to 30 mg daily.  I have sent a prescription to your pharmacy.  Please come back for a follow-up appointment in 6 months.

## 2016-05-04 NOTE — Patient Instructions (Addendum)
Great job on your A1C! Be sure to drink adequate fluid. Have 3 meals per day.   Be sure to have protein each time you eat:  Here are easy choices.  Peanut butter  Greek yogurt  Small portion of cheese  Beans (rinsed canned)  Eggs  Yemenuna or other fish  Rotisserie chicken  [place]urkey  Consider Boost Glucose Control, Glucerna or Sugar Free DIRECTVCarnation Breakfast Essentials when you do not feel like eating a meal.

## 2016-05-04 NOTE — Progress Notes (Signed)
Appointment 10:15-10:30  Nutrition Follow up:  Initial DSME evaluation completed 02/04/16.  Diagnosis of Diabetes: Type 2. Other hx includes COPD on chronic Oxygen, HTN, GERD, dyslipidemia. Labs noted 01/12/16 cholesterol 276, Triglycerides 430, HDL 30, LDL 130.  His last HgbA1C was 5.9% (05/04/16) which has decreased from 7.1% (07/15/15).  My concern at that time and currently continues to be his lack of protein intake and balance of nutrition.  His weight has increased today possibly due to fluid.  Patient lives alone. His brother and sister in law live close by and are very supportive. He only eats twice per day and overall diet is deficient in protein many days. He is on disability. He is unable to exercise much due to dyspnea but gets increased activity during the summer due to yard work. He states that his sister is constantly encouraging him to drink more water and eat better.  Wt Readings from Last 3 Encounters:  05/04/16 221 lb 8 oz (100.472 kg)  03/08/16 216 lb (97.977 kg)  02/09/16 215 lb (97.523 kg)   Diet and sleep history:  Wakes early but does not eat.  States he only sleeps for about 2 hours.  States that he has not been good about wearing his oxygen at night due to the sound of the oxygen machine.  Intake 05/03/16: Breakfast:  2 eggs, sausage, grits usually around lunch time. Lunch:  None Dinner:  Bologna and [employer] Cheese Sandwich, carrots and radishes OR TV dinner OR salad (without protein) Snack:  Apple  Intervention: Continued to discuss the importance of adequate fluid intake and regular meal schedule including protein with all meals. Discussed importance of wearing his oxygen each night.  Discussed supplement options to help with nutritional intake.  Great job on your A1C! Be sure to drink adequate fluid. Have 3 meals per day.   Be sure to have protein each time you eat:  Here are easy choices.  Peanut butter  Greek yogurt  Small portion of cheese  Beans  (rinsed canned)  Eggs  Yemenuna or other fish  Rotisserie chicken  [place]urkey  Consider Boost Glucose Control, Glucerna or Sugar Free DIRECTVCarnation Breakfast Essentials when you do not feel like eating a meal.  Patient was able to verbalize the information but question changes due to difficulty/motivation.  He is to call for an appointment as needed.  Oran ReinLaura Nafeesa Dils, RD, LDN

## 2016-05-04 NOTE — Progress Notes (Signed)
Subjective:    Patient ID: [name], male    DOB: 03/03/1956, 60 y.o.   MRN: [id number]  HPI  Pt returns for f/u of diabetes mellitus: DM type: 2 Dx'ed: 3154 Complications: polyneuropathy Therapy: 2 oral meds DKA: never Severe hypoglycemia: never Pancreatitis: never Other: he has never been on insulin (except while in the hospital on steroids). Interval history: he says cbg varies from 69- 153.  pt states he feels well in general, except for chronic doe.  Past Medical History  Diagnosis Date  . GERD (gastroesophageal reflux disease)   . Depression   . ILD (interstitial lung disease) ([place])   . Panic attack   . Dyslipidemia   . Hypertension     Echo 10/09 EF 60%  . Dyslipidemia   . COPD (chronic obstructive pulmonary disease) ([place]) 09/23/08    -PFT 09/23/08 FEV1 2.89(84%), FVC 3.70 (78%), TLC 5.28 (78%), DLCO 64% mixed obstructive and  restrictive pattern.  Spirometry 03/11/09 FEV 3.35 (97%),FVC 4.10 (86%), FEV% 82. Steroid responsive pneumonitis 04/09- required mechanical ventilation during hospitalization - The following hospital test were negative: PCP,ANA,HIV,BAL. - ACE25,RF20,CRP 28,A1AT 263, ESR 109  . History of skin cancer   . Diabetes mellitus without complication [facility])     Past Surgical History  Procedure Laterality Date  . Hernia repair      2 hernia  . Left shoulder surgery    . Ganglion cyst on foot    . Bronchoscopy  10/09    Social History   Social History  . Marital Status: Divorced    Spouse Name: N/A  . Number of Children: N/A  . Years of Education: N/A   Occupational History  . Theme park manager    Social History Main Topics  . Smoking status: Former Smoker -- 1.00 packs/day for 16 years    Types: Cigarettes    Quit date: 10/17/2003  . Smokeless tobacco: Never Used  . Alcohol Use: No  . Drug Use: No  . Sexual Activity: Not Currently   Other Topics Concern  . Not on file   Social History Narrative   Divorced. No Children   Drug  use-No   Regular exercise-yes   Lives alone    Current Outpatient Prescriptions on File Prior to Visit  Medication Sig Dispense Refill  . aspirin 81 MG EC tablet Take 81 mg by mouth daily.      . Azilsartan-Chlorthalidone (EDARBYCLOR) 40-25 MG TABS Take 1 tablet by mouth daily. 90 tablet 1  . fexofenadine (ALLEGRA) 180 MG tablet Take 180 mg by mouth daily.      Marland Kitchen glucose blood (ONETOUCH VERIO) test strip 1 each by Other route daily. And lancets 1/day 100 each 12  . Ipratropium-Albuterol (COMBIVENT RESPIMAT) 20-100 MCG/ACT AERS respimat Inhale 1 puff into the lungs every 6 (six) hours as needed for wheezing or shortness of breath. 3 Inhaler 1  . metFORMIN (GLUCOPHAGE) 1000 MG tablet Take 1 tablet (1,000 mg total) by mouth 2 (two) times daily with a meal. 180 tablet 1  . NON FORMULARY Oxygen continuous 1.5 liters with activity     . Red Yeast Rice Extract (RED YEAST RICE PO) Take 1 tablet by mouth daily. Reported on 03/08/2016    . fluticasone (FLONASE) 50 MCG/ACT nasal spray Place 2 sprays into both nostrils daily. (Patient not taking: Reported on 05/04/2016) 16 g 2   No current facility-administered medications on file prior to visit.    Allergies  Allergen Reactions  . Latex   .  Morphine   . Statins     REACTION: weakness, muscle pains    Family History  Problem Relation Age of Onset  . Heart attack Father 21  . Heart attack Brother   . Emphysema Brother   . Diabetes Brother   . Diabetes Father     BP 116/70 mmHg  Pulse 96  Temp(Src) 98 F (36.7 C) (Oral)  Wt 221 lb 8 oz (100.472 kg)  Review of Systems He has slight swelling of the legs    Objective:   Physical Exam VITAL SIGNS:  See vs page GENERAL: no distress Pulses: dorsalis pedis intact bilat.   MSK: no deformity of the feet CV: trace bilat leg edema Skin:  no ulcer on the feet.  normal color and temp on the feet. Neuro: sensation is intact to touch on the feet   A1c=5.9%    Assessment & Plan:  Type 2  DM: well-controlled Edema, new  Patient is advised the following: Patient Instructions  check your blood sugar once a day.  vary the time of day when you check, between before the 3 meals, and at bedtime.  also check if you have symptoms of your blood sugar being too high or too low.  please keep a record of the readings and bring it to your next appointment here (or you can bring the meter itself).  You can write it on any piece of paper.  please call us sooner if your blood sugar goes below 70, or if you have a lot of readings over 200.   Please reduce the pioglitizone to 30 mg daily.  I have sent a prescription to your pharmacy.  Please come back for a follow-up appointment in 6 months.     Renato Shin, MD

## 2016-05-08 ENCOUNTER — Ambulatory Visit (INDEPENDENT_AMBULATORY_CARE_PROVIDER_SITE_OTHER): Payer: Medicare Other | Admitting: Internal Medicine

## 2016-05-08 ENCOUNTER — Other Ambulatory Visit (INDEPENDENT_AMBULATORY_CARE_PROVIDER_SITE_OTHER): Payer: Medicare Other

## 2016-05-08 ENCOUNTER — Encounter: Payer: Self-pay | Admitting: Internal Medicine

## 2016-05-08 VITALS — BP 124/82 | HR 109 | Temp 98.4°F | Resp 18 | Wt 220.0 lb

## 2016-05-08 DIAGNOSIS — I10 Essential (primary) hypertension: Secondary | ICD-10-CM

## 2016-05-08 DIAGNOSIS — Z23 Encounter for immunization: Secondary | ICD-10-CM

## 2016-05-08 DIAGNOSIS — E118 Type 2 diabetes mellitus with unspecified complications: Secondary | ICD-10-CM | POA: Diagnosis not present

## 2016-05-08 LAB — BASIC METABOLIC PANEL
BUN: 20 mg/dL (ref 6–23)
CHLORIDE: 101 meq/L (ref 96–112)
CO2: 29 mEq/L (ref 19–32)
Calcium: 10 mg/dL (ref 8.4–10.5)
Creatinine, Ser: 1.29 mg/dL (ref 0.40–1.50)
GFR: 59.91 mL/min — ABNORMAL LOW (ref >60.00)
Glucose, Bld: 94 mg/dL (ref 70–99)
POTASSIUM: 4.4 meq/L (ref 3.5–5.1)
Sodium: 138 mEq/L (ref 135–145)

## 2016-05-08 MED ORDER — CHLORTHALIDONE 25 MG PO TABS
25.0000 mg | ORAL_TABLET | Freq: Every day | ORAL | 1 refills | Status: DC
Start: 2016-05-08 — End: 2016-11-02

## 2016-05-08 NOTE — Progress Notes (Signed)
Pre visit review using our clinic review tool, if applicable. No additional management support is needed unless otherwise documented below in the visit note. 

## 2016-05-08 NOTE — Patient Instructions (Signed)
Hypertension Hypertension, commonly called high blood pressure, is when the force of blood pumping through your arteries is too strong. Your arteries are the blood vessels that carry blood from your heart throughout your body. A blood pressure reading consists of a higher number over a lower number, such as 110/72. The higher number (systolic) is the pressure inside your arteries when your heart pumps. The lower number (diastolic) is the pressure inside your arteries when your heart relaxes. Ideally you want your blood pressure below 120/80. Hypertension forces your heart to work harder to pump blood. Your arteries may become narrow or stiff. Having untreated or uncontrolled hypertension can cause heart attack, stroke, kidney disease, and other problems. RISK FACTORS Some risk factors for high blood pressure are controllable. Others are not.  Risk factors you cannot control include:   Race. You may be at higher risk if you are African American.  Age. Risk increases with age.  Gender. Men are at higher risk than women before age 45 years. After age 65, women are at higher risk than men. Risk factors you can control include:  Not getting enough exercise or physical activity.  Being overweight.  Getting too much fat, sugar, calories, or salt in your diet.  Drinking too much alcohol. SIGNS AND SYMPTOMS Hypertension does not usually cause signs or symptoms. Extremely high blood pressure (hypertensive crisis) may cause headache, anxiety, shortness of breath, and nosebleed. DIAGNOSIS To check if you have hypertension, your health care provider will measure your blood pressure while you are seated, with your arm held at the level of your heart. It should be measured at least twice using the same arm. Certain conditions can cause a difference in blood pressure between your right and left arms. A blood pressure reading that is higher than normal on one occasion does not mean that you need treatment. If  it is not clear whether you have high blood pressure, you may be asked to return on a different day to have your blood pressure checked again. Or, you may be asked to monitor your blood pressure at home for 1 or more weeks. TREATMENT Treating high blood pressure includes making lifestyle changes and possibly taking medicine. Living a healthy lifestyle can help lower high blood pressure. You may need to change some of your habits. Lifestyle changes may include:  Following the DASH diet. This diet is high in fruits, vegetables, and whole grains. It is low in salt, red meat, and added sugars.  Keep your sodium intake below 2,300 mg per day.  Getting at least 30-45 minutes of aerobic exercise at least 4 times per week.  Losing weight if necessary.  Not smoking.  Limiting alcoholic beverages.  Learning ways to reduce stress. Your health care provider may prescribe medicine if lifestyle changes are not enough to get your blood pressure under control, and if one of the following is true:  You are 18-59 years of age and your systolic blood pressure is above 140.  You are 60 years of age or older, and your systolic blood pressure is above 150.  Your diastolic blood pressure is above 90.  You have diabetes, and your systolic blood pressure is over 140 or your diastolic blood pressure is over 90.  You have kidney disease and your blood pressure is above 140/90.  You have heart disease and your blood pressure is above 140/90. Your personal target blood pressure may vary depending on your medical conditions, your age, and other factors. HOME CARE INSTRUCTIONS    Have your blood pressure rechecked as directed by your health care provider.   Take medicines only as directed by your health care provider. Follow the directions carefully. Blood pressure medicines must be taken as prescribed. The medicine does not work as well when you skip doses. Skipping doses also puts you at risk for  problems.  Do not smoke.   Monitor your blood pressure at home as directed by your health care provider. SEEK MEDICAL CARE IF:   You think you are having a reaction to medicines taken.  You have recurrent headaches or feel dizzy.  You have swelling in your ankles.  You have trouble with your vision. SEEK IMMEDIATE MEDICAL CARE IF:  You develop a severe headache or confusion.  You have unusual weakness, numbness, or feel faint.  You have severe chest or abdominal pain.  You vomit repeatedly.  You have trouble breathing. MAKE SURE YOU:   Understand these instructions.  Will watch your condition.  Will get help right away if you are not doing well or get worse.   This information is not intended to replace advice given to you by your health care provider. Make sure you discuss any questions you have with your health care provider.   Document Released: 10/02/2005 Document Revised: 02/16/2015 Document Reviewed: 07/25/2013 Elsevier Interactive Patient Education 2016 [employer]  

## 2016-05-08 NOTE — Progress Notes (Signed)
Subjective:  Patient ID: [name], male    DOB: [birth year]  Age: 60 y.o. MRN: [id number]  CC: Hypertension   HPI JARRICK HENRICHS presents for a blood pressure check. He is concerned that his blood pressure has been too low and he has had a few episodes of dizziness and lightheadedness. He is taking an ARB and thiazide diuretic combination. He is willing to stop the ARB but he thinks he needs to continue taking the thiazide diuretic because if he retains fluid he would be catastrophic for his lung disease.  Outpatient Medications Prior to Visit  Medication Sig Dispense Refill  . aspirin 81 MG EC tablet Take 81 mg by mouth daily.      . fexofenadine (ALLEGRA) 180 MG tablet Take 180 mg by mouth daily.      . fluticasone (FLONASE) 50 MCG/ACT nasal spray Place 2 sprays into both nostrils daily. 16 g 2  . glucose blood (ONETOUCH VERIO) test strip 1 each by Other route daily. And lancets 1/day 100 each 12  . Ipratropium-Albuterol (COMBIVENT RESPIMAT) 20-100 MCG/ACT AERS respimat Inhale 1 puff into the lungs every 6 (six) hours as needed for wheezing or shortness of breath. 3 Inhaler 1  . metFORMIN (GLUCOPHAGE) 1000 MG tablet Take 1 tablet (1,000 mg total) by mouth 2 (two) times daily with a meal. 180 tablet 1  . NON FORMULARY Oxygen continuous 1.5 liters with activity     . Omega-3 Fatty Acids (FISH OIL PO) Take 1 capsule by mouth 2 (two) times daily.    . pioglitazone (ACTOS) 30 MG tablet Take 1 tablet (30 mg total) by mouth daily. 90 tablet 3  . Red Yeast Rice Extract (RED YEAST RICE PO) Take 1 tablet by mouth daily. Reported on 03/08/2016    . Azilsartan-Chlorthalidone (EDARBYCLOR) 40-25 MG TABS Take 1 tablet by mouth daily. 90 tablet 1   No facility-administered medications prior to visit.     ROS Review of Systems  Constitutional: Negative.  Negative for activity change, appetite change, chills, diaphoresis, fatigue and unexpected weight change.  HENT: Negative.   Eyes: Negative.   Negative for visual disturbance.  Respiratory: Positive for shortness of breath. Negative for cough, choking, chest tightness and stridor.   Cardiovascular: Negative.  Negative for chest pain, palpitations and leg swelling.  Gastrointestinal: Negative.  Negative for abdominal pain, diarrhea, nausea and vomiting.  Endocrine: Negative.   Genitourinary: Negative.  Negative for difficulty urinating, dysuria and urgency.  Musculoskeletal: Negative.  Negative for arthralgias, back pain, myalgias and neck pain.  Skin: Negative.   Neurological: Positive for dizziness and light-headedness. Negative for weakness.  Hematological: Negative.   Psychiatric/Behavioral: Negative.     Objective:  BP 124/82   Pulse (!) 109   Temp 98.4 F (36.9 C) (Oral)   Resp 18   Wt 220 lb (99.8 kg)   SpO2 95%   BMI 32.49 kg/m   BP Readings from Last 3 Encounters:  05/08/16 124/82  05/04/16 116/70  03/08/16 92/70    Wt Readings from Last 3 Encounters:  05/08/16 220 lb (99.8 kg)  05/04/16 221 lb 8 oz (100.5 kg)  03/08/16 216 lb (98 kg)    Physical Exam  Constitutional: He is oriented to person, place, and time.  Non-toxic appearance. He has a sickly appearance. He appears ill. No distress.  O2 dependent  HENT:  Mouth/Throat: Oropharynx is clear and moist. No oropharyngeal exudate.  Eyes: Conjunctivae are normal. Right eye exhibits no discharge. Left eye  exhibits no discharge. No scleral icterus.  Neck: Normal range of motion. Neck supple. No JVD present. No tracheal deviation present. No thyromegaly present.  Cardiovascular: Normal rate, regular rhythm, normal heart sounds and intact distal pulses.  Exam reveals no gallop and no friction rub.   No murmur heard. Pulmonary/Chest: Effort normal and breath sounds normal. No stridor. No respiratory distress. He has no wheezes. He has no rales. He exhibits no tenderness.  Abdominal: Soft. Bowel sounds are normal. He exhibits no distension and no mass. There is  no tenderness. There is no rebound and no guarding.  Musculoskeletal: Normal range of motion. He exhibits no edema, tenderness or deformity.  Lymphadenopathy:    He has no cervical adenopathy.  Neurological: He is oriented to person, place, and time.  Skin: Skin is warm and dry. No rash noted. He is not diaphoretic. No erythema. No pallor.  Vitals reviewed.   Lab Results  Component Value Date   WBC 10.1 07/15/2015   HGB 15.9 07/15/2015   HCT 46.8 07/15/2015   PLT 262.0 07/15/2015   GLUCOSE 94 05/08/2016   CHOL 276 (H) 01/12/2016   TRIG (H) 01/12/2016    438.0 Triglyceride is over 400; calculations on Lipids are invalid.   HDL 30.20 (L) 01/12/2016   LDLDIRECT 131.0 01/12/2016   LDLCALC 196 (H) 07/13/2014   ALT 55 (H) 07/15/2015   AST 27 07/15/2015   NA 138 05/08/2016   K 4.4 05/08/2016   CL 101 05/08/2016   CREATININE 1.29 05/08/2016   BUN 20 05/08/2016   CO2 29 05/08/2016   TSH 1.23 01/12/2016   PSA 0.85 07/15/2015   INR 1.2 08/04/2008   HGBA1C 5.9 05/04/2016   MICROALBUR <0.7 01/12/2016    Dg Chest 2 View  Result Date: 02/09/2014 CLINICAL DATA:  COPD.  Former smoker. EXAM: CHEST  2 VIEW COMPARISON:  None. FINDINGS: The heart size and mediastinal contours are within normal limits. Marked coarsened interstitial markings are identified bilaterally. No superimposed airspace consolidation identified. The visualized skeletal structures are unremarkable. IMPRESSION: 1. Chronic interstitial lung disease. 2. No acute cardiopulmonary abnormalities. Electronically Signed   By: Signa Kell M.D.   On: 02/09/2014 14:32    Assessment & Plan:   Breylen was seen today for hypertension.  Diagnoses and all orders for this visit:  Essential hypertension, benign- His blood pressure may be over controlled so will stop the ARB but will continue the thiazide diuretic. -     Basic metabolic panel; Future -     chlorthalidone (HYGROTON) 25 MG tablet; Take 1 tablet (25 mg total) by mouth  daily.  Type 2 diabetes mellitus with complication, without long-term current use of insulin (HCC)- recent A1c was 5.9%, his blood sugars are very well controlled. -     Basic metabolic panel; Future -     Ambulatory referral to Ophthalmology  Need for prophylactic vaccination and inoculation against single disease -     Varicella-zoster vaccine subcutaneous   I have discontinued Mr. Ozga Azilsartan-Chlorthalidone. I am also having him start on chlorthalidone. Additionally, I am having him maintain his aspirin, NON FORMULARY, fexofenadine, Red Yeast Rice Extract (RED YEAST RICE PO), fluticasone, Ipratropium-Albuterol, metFORMIN, glucose blood, Omega-3 Fatty Acids (FISH OIL PO), and pioglitazone.  Meds ordered this encounter  Medications  . chlorthalidone (HYGROTON) 25 MG tablet    Sig: Take 1 tablet (25 mg total) by mouth daily.    Dispense:  90 tablet    Refill:  1  Follow-up: Return in about 3 months (around 08/08/2016).  Sanda Linger, MD

## 2016-05-16 DIAGNOSIS — J188 Other pneumonia, unspecified organism: Secondary | ICD-10-CM | POA: Diagnosis not present

## 2016-05-17 LAB — HM DIABETES EYE EXAM

## 2016-05-30 DIAGNOSIS — H2513 Age-related nuclear cataract, bilateral: Secondary | ICD-10-CM | POA: Diagnosis not present

## 2016-05-30 DIAGNOSIS — E119 Type 2 diabetes mellitus without complications: Secondary | ICD-10-CM | POA: Diagnosis not present

## 2016-06-16 DIAGNOSIS — J188 Other pneumonia, unspecified organism: Secondary | ICD-10-CM | POA: Diagnosis not present

## 2016-07-10 ENCOUNTER — Other Ambulatory Visit: Payer: Self-pay | Admitting: Internal Medicine

## 2016-07-10 DIAGNOSIS — E118 Type 2 diabetes mellitus with unspecified complications: Secondary | ICD-10-CM

## 2016-07-16 DIAGNOSIS — J188 Other pneumonia, unspecified organism: Secondary | ICD-10-CM | POA: Diagnosis not present

## 2016-08-08 ENCOUNTER — Encounter: Payer: Self-pay | Admitting: Internal Medicine

## 2016-08-08 ENCOUNTER — Ambulatory Visit (INDEPENDENT_AMBULATORY_CARE_PROVIDER_SITE_OTHER): Payer: Medicare Other | Admitting: Internal Medicine

## 2016-08-08 ENCOUNTER — Other Ambulatory Visit (INDEPENDENT_AMBULATORY_CARE_PROVIDER_SITE_OTHER): Payer: Medicare Other

## 2016-08-08 VITALS — BP 132/90 | HR 102 | Temp 98.0°F | Resp 16 | Ht 69.0 in | Wt 224.0 lb

## 2016-08-08 DIAGNOSIS — Z23 Encounter for immunization: Secondary | ICD-10-CM | POA: Diagnosis not present

## 2016-08-08 DIAGNOSIS — E785 Hyperlipidemia, unspecified: Secondary | ICD-10-CM | POA: Diagnosis not present

## 2016-08-08 DIAGNOSIS — E118 Type 2 diabetes mellitus with unspecified complications: Secondary | ICD-10-CM | POA: Diagnosis not present

## 2016-08-08 DIAGNOSIS — E781 Pure hyperglyceridemia: Secondary | ICD-10-CM

## 2016-08-08 DIAGNOSIS — I10 Essential (primary) hypertension: Secondary | ICD-10-CM

## 2016-08-08 LAB — LIPID PANEL
Cholesterol: 279 mg/dL — ABNORMAL HIGH (ref 0–200)
HDL: 45.2 mg/dL (ref >39.00)
NonHDL: 233.53
Total CHOL/HDL Ratio: 6
Triglycerides: 286 mg/dL — ABNORMAL HIGH (ref 0.0–149.0)
VLDL: 57.2 mg/dL — AB (ref 0.0–40.0)

## 2016-08-08 LAB — BASIC METABOLIC PANEL
BUN: 14 mg/dL (ref 6–23)
CALCIUM: 10.3 mg/dL (ref 8.4–10.5)
CO2: 29 mEq/L (ref 19–32)
Chloride: 100 mEq/L (ref 96–112)
Creatinine, Ser: 1.01 mg/dL (ref 0.40–1.50)
GFR: 79.39 mL/min (ref >60.00)
GLUCOSE: 91 mg/dL (ref 70–99)
Potassium: 4 mEq/L (ref 3.5–5.1)
SODIUM: 137 meq/L (ref 135–145)

## 2016-08-08 LAB — LDL CHOLESTEROL, DIRECT: Direct LDL: 207 mg/dL

## 2016-08-08 LAB — HEMOGLOBIN A1C: Hgb A1c MFr Bld: 5.7 % (ref 4.6–6.5)

## 2016-08-08 NOTE — Progress Notes (Signed)
Pre visit review using our clinic review tool, if applicable. No additional management support is needed unless otherwise documented below in the visit note. 

## 2016-08-08 NOTE — Progress Notes (Signed)
Subjective:  Patient ID: [name], male    DOB: [birth year]  Age: 60 y.o. MRN: [id number]  CC: Hypertension; Diabetes; and Hyperlipidemia   HPI [name] presents for f/up - He has his baseline level of shortness of breath but denies any recent episodes of coughing, chest pain, hemoptysis, blurred vision, polyuria, polydipsia, or polyphagia. He has had a few blood sugars down into the 70s and 80s and that concerns him because he didn't feel well, he developed dizziness and fatigue.  Outpatient Medications Prior to Visit  Medication Sig Dispense Refill  . aspirin 81 MG EC tablet Take 81 mg by mouth daily.      . chlorthalidone (HYGROTON) 25 MG tablet Take 1 tablet (25 mg total) by mouth daily. 90 tablet 1  . fexofenadine (ALLEGRA) 180 MG tablet Take 180 mg by mouth daily.      . fluticasone (FLONASE) 50 MCG/ACT nasal spray Place 2 sprays into both nostrils daily. 16 g 2  . glucose blood (ONETOUCH VERIO) test strip 1 each by Other route daily. And lancets 1/day 100 each 12  . Ipratropium-Albuterol (COMBIVENT RESPIMAT) 20-100 MCG/ACT AERS respimat Inhale 1 puff into the lungs every 6 (six) hours as needed for wheezing or shortness of breath. 3 Inhaler 1  . metFORMIN (GLUCOPHAGE) 1000 MG tablet TAKE 1 TABLET (1,000 MG TOTAL) BY MOUTH 2 (TWO) TIMES DAILY WITH A MEAL. 180 tablet 1  . NON FORMULARY Oxygen continuous 1.5 liters with activity     . Omega-3 Fatty Acids (FISH OIL PO) Take 1 capsule by mouth 2 (two) times daily.    . Red Yeast Rice Extract (RED YEAST RICE PO) Take 1 tablet by mouth daily. Reported on 03/08/2016    . pioglitazone (ACTOS) 30 MG tablet Take 1 tablet (30 mg total) by mouth daily. 90 tablet 3   No facility-administered medications prior to visit.     ROS Review of Systems  Constitutional: Negative.  Negative for appetite change, chills, diaphoresis and fatigue.  HENT: Negative.  Negative for voice change.   Eyes: Negative for visual disturbance.  Respiratory:  Positive for shortness of breath. Negative for cough, choking, chest tightness, wheezing and stridor.   Cardiovascular: Negative.  Negative for chest pain, palpitations and leg swelling.  Gastrointestinal: Negative for abdominal pain, constipation, diarrhea, nausea and vomiting.  Endocrine: Negative for cold intolerance, polydipsia, polyphagia and polyuria.  Genitourinary: Negative.   Musculoskeletal: Negative.  Negative for joint swelling and myalgias.  Skin: Negative.   Neurological: Negative.  Negative for dizziness.  Hematological: Negative for adenopathy. Does not bruise/bleed easily.  Psychiatric/Behavioral: Negative.     Objective:  BP 132/90 (BP Location: Left Arm, Patient Position: Sitting, Cuff Size: Normal)   Pulse (!) 102   Temp 98 F (36.7 C) (Oral)   Resp 16   Ht 5\' 9"  (1.753 m)   Wt 224 lb (101.6 kg)   SpO2 97%   BMI 33.08 kg/m   BP Readings from Last 3 Encounters:  08/08/16 132/90  05/08/16 124/82  05/04/16 116/70    Wt Readings from Last 3 Encounters:  08/08/16 224 lb (101.6 kg)  05/08/16 220 lb (99.8 kg)  05/04/16 221 lb 8 oz (100.5 kg)    Physical Exam  Constitutional: He is oriented to person, place, and time. No distress.  HENT:  Mouth/Throat: Oropharynx is clear and moist. No oropharyngeal exudate.  Eyes: Conjunctivae are normal. Right eye exhibits no discharge. Left eye exhibits no discharge. No scleral icterus.  Neck: Normal range of motion. Neck supple. No JVD present. No tracheal deviation present. No thyromegaly present.  Cardiovascular: Normal rate, regular rhythm and intact distal pulses.  Exam reveals no gallop and no friction rub.   No murmur heard. Pulmonary/Chest: Effort normal and breath sounds normal. No stridor. No respiratory distress. He has no wheezes. He has no rales. He exhibits no tenderness.  Abdominal: Soft. Bowel sounds are normal. He exhibits no distension and no mass. There is no tenderness. There is no rebound and no  guarding.  Musculoskeletal: Normal range of motion. He exhibits no edema, tenderness or deformity.  Lymphadenopathy:    He has no cervical adenopathy.  Neurological: He is oriented to person, place, and time.  Skin: Skin is warm and dry. No rash noted. He is not diaphoretic. No erythema. No pallor.  Vitals reviewed.   Lab Results  Component Value Date   WBC 10.1 07/15/2015   HGB 15.9 07/15/2015   HCT 46.8 07/15/2015   PLT 262.0 07/15/2015   GLUCOSE 91 08/08/2016   CHOL 279 (H) 08/08/2016   TRIG 286.0 (H) 08/08/2016   HDL 45.20 08/08/2016   LDLDIRECT 207.0 08/08/2016   LDLCALC 196 (H) 07/13/2014   ALT 55 (H) 07/15/2015   AST 27 07/15/2015   NA 137 08/08/2016   K 4.0 08/08/2016   CL 100 08/08/2016   CREATININE 1.01 08/08/2016   BUN 14 08/08/2016   CO2 29 08/08/2016   TSH 1.23 01/12/2016   PSA 0.85 07/15/2015   INR 1.2 08/04/2008   HGBA1C 5.7 08/08/2016   MICROALBUR <0.7 01/12/2016    Dg Chest 2 View  Result Date: 02/09/2014 CLINICAL DATA:  COPD.  Former smoker. EXAM: CHEST  2 VIEW COMPARISON:  None. FINDINGS: The heart size and mediastinal contours are within normal limits. Marked coarsened interstitial markings are identified bilaterally. No superimposed airspace consolidation identified. The visualized skeletal structures are unremarkable. IMPRESSION: 1. Chronic interstitial lung disease. 2. No acute cardiopulmonary abnormalities. Electronically Signed   By: Signa Kell M.D.   On: 02/09/2014 14:32    Assessment & Plan:   [name] was seen today for hypertension, diabetes and hyperlipidemia.  Diagnoses and all orders for this visit:  Need for prophylactic vaccination and inoculation against influenza -     Flu Vaccine QUAD 36+ mos IM  Essential hypertension, benign- his blood pressure is well-controlled, electrolytes and renal function are stable. -     Basic metabolic panel; Future  Type 2 diabetes mellitus with complication, without long-term current use of  insulin (HCC)- his A1c is down to 5.7%, his blood sugars are over controlled so Avastin to stop taking Actos. -     Hemoglobin A1c; Future  Pure hyperglyceridemia- improvement noted -     Lipid panel; Future  Hyperlipidemia with target LDL less than 130- his Framingham risk score is 20% but he refuses to take a statin so I've asked him to be seen in the cardiovascular lipid clinic to see if he is a candidate for PCSK9 inhibitor. -     Lipid panel; Future -     Ambulatory referral to Cardiology  Need for prophylactic vaccination against Streptococcus pneumoniae (pneumococcus) -     Pneumococcal polysaccharide vaccine 23-valent greater than or equal to 2yo subcutaneous/IM   I have discontinued Mr. Svec pioglitazone. I am also having him maintain his aspirin, NON FORMULARY, fexofenadine, Red Yeast Rice Extract (RED YEAST RICE PO), fluticasone, Ipratropium-Albuterol, glucose blood, Omega-3 Fatty Acids (FISH OIL PO), chlorthalidone, and metFORMIN.  No orders of the defined types were placed in this encounter.    Follow-up: Return in about 3 months (around 11/08/2016).  Sanda Lingerhomas Corryn Madewell, MD

## 2016-08-08 NOTE — Patient Instructions (Signed)

## 2016-08-16 ENCOUNTER — Encounter: Payer: Self-pay | Admitting: *Deleted

## 2016-08-16 DIAGNOSIS — J188 Other pneumonia, unspecified organism: Secondary | ICD-10-CM | POA: Diagnosis not present

## 2016-08-31 ENCOUNTER — Encounter: Payer: Self-pay | Admitting: Internal Medicine

## 2016-08-31 ENCOUNTER — Ambulatory Visit (INDEPENDENT_AMBULATORY_CARE_PROVIDER_SITE_OTHER): Payer: Medicare Other | Admitting: Internal Medicine

## 2016-08-31 VITALS — BP 126/90 | HR 101 | Ht 69.5 in | Wt 225.8 lb

## 2016-08-31 DIAGNOSIS — R0602 Shortness of breath: Secondary | ICD-10-CM

## 2016-08-31 DIAGNOSIS — E782 Mixed hyperlipidemia: Secondary | ICD-10-CM | POA: Diagnosis not present

## 2016-08-31 DIAGNOSIS — I10 Essential (primary) hypertension: Secondary | ICD-10-CM | POA: Diagnosis not present

## 2016-08-31 DIAGNOSIS — R079 Chest pain, unspecified: Secondary | ICD-10-CM | POA: Diagnosis not present

## 2016-08-31 DIAGNOSIS — E785 Hyperlipidemia, unspecified: Secondary | ICD-10-CM | POA: Diagnosis not present

## 2016-08-31 LAB — ALT: ALT: 36 U/L (ref 9–46)

## 2016-08-31 LAB — CK: Total CK: 59 U/L (ref 7–232)

## 2016-08-31 MED ORDER — ROSUVASTATIN CALCIUM 5 MG PO TABS: ORAL_TABLET | ORAL | 1 refills | Status: DC

## 2016-08-31 NOTE — Progress Notes (Signed)
New Outpatient Visit Date: 08/31/2016  Referring Provider: [name], MD [address], [place] [address]  Chief Complaint: Hyperlipidemia  HPI:  [name] is a 60 y.o. year-old male with history of type 2 diabetes mellitus, hypertension, hyperlipidemia, GERD, depression/anxiety, COPD, and interstitial lung disease, who has been referred by Dr. Ronnald Ramp for evaluation of hyperlipidemia. [name] reports that he has a long history of hyperlipidemia and took statins for several years. However, he discontinued these over a decade ago due to persistent pain in his legs. He believes he was on atorvastatin and rosuvastatin at one point. He also had "issues with his liver" that reiterated his desire to avoid statins.  [name] has chronic shortness of breath due to COPD and interstitial lung disease. He uses oxygen at night and whenever he exerts himself. He notes that his breathing has been relatively stable for over a year. He endorses occasional chest pain that he describes as a needle prick. Last night he had an episode that was a more pronounced "thump" in the center of his chest that lasted for just a second. He had some residual soreness that resolved after about 30 seconds. He denied associated symptoms including shortness of breath or lightheadedness. He notes minimal tightness in his chest when walking, which has been stable for years. He reports undergoing remote cardiac catheterization and stress test by [facility]. He has stable two-pillow orthopnea without PND or lower extremity edema.  The patient has not had palpitations that he notes that his heart rate jumps up quite a bit whenever he exerts himself. It typically is around 110 bpm but has climbed to as high as 150 bpm. He has not had any lightheadedness with the exception of an episode of low blood pressure this spring. His blood pressure medication was adjusted by Dr. Ronnald Ramp with resultant resolution of the  lightheadedness.  --------------------------------------------------------------------------------------------------  Cardiovascular History & Procedures: Cardiovascular Problems:  Chronic shortness of breath  Hyperlipidemia with statin intolerance  Chest pain  Risk Factors:  DM, HTN, hyperlipidemia, male gender, and age > 58  Cath/PCI:  LHC (11/15/05): LMCA normal. LAD, LCx, and RCA with mild luminal irregularities. Normal LVEF by ventriculogram.  CV Surgery:  None  EP Procedures and Devices:  None  Non-Invasive Evaluation(s):  None  Recent CV Pertinent Labs: Lab Results  Component Value Date   CHOL 279 (H) 08/08/2016   HDL 45.20 08/08/2016   LDLCALC 196 (H) 07/13/2014   LDLDIRECT 207.0 08/08/2016   TRIG 286.0 (H) 08/08/2016   CHOLHDL 6 08/08/2016   INR 1.2 08/04/2008   K 4.0 08/08/2016   MG 2.5 08/15/2008   BUN 14 08/08/2016   CREATININE 1.01 08/08/2016    --------------------------------------------------------------------------------------------------  Past Medical History:  Diagnosis Date  . COPD (chronic obstructive pulmonary disease) ([place]) 09/23/08   -PFT 09/23/08 FEV1 2.89(84%), FVC 3.70 (78%), TLC 5.28 (78%), DLCO 64% mixed obstructive and  restrictive pattern.  Spirometry 03/11/09 FEV 3.35 (97%),FVC 4.10 (86%), FEV% 82. Steroid responsive pneumonitis 04/09- required mechanical ventilation during hospitalization - The following hospital test were negative: PCP,ANA,HIV,BAL. - ACE25,RF20,CRP 28,A1AT 263, ESR 109  . Depression   . Diabetes mellitus without complication ([place])   . Dyslipidemia   . Dyslipidemia   . GERD (gastroesophageal reflux disease)   . History of skin cancer   . Hypertension    Echo 10/09 EF 60%  . ILD (interstitial lung disease) ([place])   . Panic attack     Past  Surgical History:  Procedure Laterality Date  . BRONCHOSCOPY  10/09  . Ganglion cyst on foot    . HERNIA REPAIR     2 hernia  . Left shoulder surgery       Outpatient Encounter Prescriptions as of 08/31/2016  Medication Sig  . aspirin 81 MG EC tablet Take 81 mg by mouth daily.    . chlorthalidone (HYGROTON) 25 MG tablet Take 1 tablet (25 mg total) by mouth daily.  . fexofenadine (ALLEGRA) 180 MG tablet Take 180 mg by mouth daily.    . fluticasone (FLONASE) 50 MCG/ACT nasal spray Place 2 sprays into both nostrils daily.  Marland Kitchen glucose blood (ONETOUCH VERIO) test strip 1 each by Other route daily. And lancets 1/day  . Ipratropium-Albuterol (COMBIVENT RESPIMAT) 20-100 MCG/ACT AERS respimat Inhale 1 puff into the lungs every 6 (six) hours as needed for wheezing or shortness of breath.  . metFORMIN (GLUCOPHAGE) 1000 MG tablet TAKE 1 TABLET (1,000 MG TOTAL) BY MOUTH 2 (TWO) TIMES DAILY WITH A MEAL.  . NON FORMULARY Oxygen continuous 1.5 liters with activity   . Omega-3 Fatty Acids (FISH OIL PO) Take 1 capsule by mouth 2 (two) times daily.  . Red Yeast Rice Extract (RED YEAST RICE PO) Take 1 tablet by mouth daily. Reported on 03/08/2016   No facility-administered encounter medications on file as of 08/31/2016.     Allergies: Latex; Morphine; and Statins  Social History   Social History  . Marital status: Divorced    Spouse name: N/A  . Number of children: N/A  . Years of education: N/A   Occupational History  . Theme park manager Unemployed   Social History Main Topics  . Smoking status: Former Smoker    Packs/day: 1.00    Years: 16.00    Types: Cigarettes    Quit date: 10/17/2003  . Smokeless tobacco: Never Used  . Alcohol use No  . Drug use: No  . Sexual activity: Not Currently   Other Topics Concern  . Not on file   Social History Narrative   Divorced. No Children   Drug use-No   Regular exercise-yes   Lives alone    Family History  Problem Relation Age of Onset  . Heart attack Father 58  . Diabetes Father   . Heart attack Brother   . Emphysema Brother   . Diabetes Brother     Review of Systems: Review of Systems   Constitutional: Negative.   Eyes: Negative.   Respiratory: Positive for shortness of breath, wheezing and stridor (snoring).   Cardiovascular: Positive for chest pain and orthopnea.  Gastrointestinal: Negative.   Genitourinary: Negative.   Musculoskeletal: Positive for myalgias.  Skin: Negative.   Neurological: Negative.   Endo/Heme/Allergies: Negative.   Psychiatric/Behavioral: Negative.    --------------------------------------------------------------------------------------------------  Physical Exam: BP 126/90   Pulse (!) 101   Ht 5' 9.5" (1.765 m)   Wt 225 lb 12.8 oz (102.4 kg)   BMI 32.87 kg/m   General:  Obese man, seated when the exam room. HEENT: No conjunctival pallor or scleral icterus.  Moist mucous membranes.  OP clear. Neck: Supple without lymphadenopathy, thyromegaly, JVD, or HJR.  No carotid bruit. Lungs: Normal work of breathing. Coarse breath sounds with faint bibasilar crackles. No wheezes. Heart: Tachycardic but regular without murmurs, rubs, or gallops.  Non-displaced PMI. Abd: Bowel sounds present.  Soft, NT/ND without hepatosplenomegaly Ext: No lower extremity edema.  2+ radial and 1+ PT/DP pulses bilaterally Skin: warm and dry without rash Neuro:  CNIII-XII intact.  Strength and fine-touch sensation intact in upper and lower extremities bilaterally. Psych: Normal mood and affect.  EKG:  Sinus tachycardia (heart rate 101 bpm). Otherwise, no significant maladies.  Lab Results  Component Value Date   WBC 10.1 07/15/2015   HGB 15.9 07/15/2015   HCT 46.8 07/15/2015   MCV 89.5 07/15/2015   PLT 262.0 07/15/2015    Lab Results  Component Value Date   NA 137 08/08/2016   K 4.0 08/08/2016   CL 100 08/08/2016   CO2 29 08/08/2016   BUN 14 08/08/2016   CREATININE 1.01 08/08/2016   GLUCOSE 91 08/08/2016   ALT 55 (H) 07/15/2015    Lab Results  Component Value Date   CHOL 279 (H) 08/08/2016   HDL 45.20 08/08/2016   LDLCALC 196 (H) 07/13/2014    LDLDIRECT 207.0 08/08/2016   TRIG 286.0 (H) 08/08/2016   CHOLHDL 6 08/08/2016   --------------------------------------------------------------------------------------------------  ASSESSMENT AND PLAN: Hyperlipidemia Recent lipid panel is notable for significant elevations of total cholesterol, LDL, and triglycerides. The patient has a history of diabetes mellitus and mild, nonobstructive CAD by remote Cardiac catheterization; he therefore warrants aggressive lipid management. He states that he has been intolerant to multiple statins in the past, including atorvastatin and rosuvastatin. We discussed treatment options, including lifestyle modification, re-trial of statins, addition of ezetimibe, or investigation of PCSK9 inhibitors.  We have agreed to continue with lifestyle modifications and trial rosuvastatin 5 mg Monday, Wednesday, and Friday. I will check a baseline ALT and creatine kinase today. If he is intolerant of even low dose rosuvastatin, we will refer him to our lipid clinic to discuss initiation of PCSK9 inhibitor.  He should remain on fish oil.  Chest pain and shortness of breath These have been chronic problems for the patient. I suspect that his shortness of breath is largely due to his underlying lung disease. He notes stable exertional chest tightness as well as occasional sharp chest pains that are very atypical. We discussed proceeding with further ischemia evaluation, but given that his symptoms are stable, have agreed to defer this. We will obtain a transthoracic echocardiogram to evaluate for structural abnormalities, particularly right heart problems and pulmonary hypertension given his chronic lung disease.  Follow-up: Return to clinic in 2 months, with lipid panel and ALT just before return visit.  Nelva Bush, MD 08/31/2016 9:24 AM

## 2016-08-31 NOTE — Patient Instructions (Signed)
Medication Instructions:  Start Crestor 5mg --take one tablet on Mondays, Wednesdays, and Fridays  Labwork: ALT/CK today.  Your physician recommends that you return for a FASTING lipid profile /ALT in 2 months a few days before you see Dr End.    Testing/Procedures: Your physician has requested that you have an echocardiogram. Echocardiography is a painless test that uses sound waves to create images of your heart. It provides your doctor with information about the size and shape of your heart and how well your heart's chambers and valves are working. This procedure takes approximately one hour. There are no restrictions for this procedure.    Follow-Up: Your physician recommends that you schedule a follow-up appointment in: 2 months with Dr End. Have fasting lab a few days before your appointment.        If you need a refill on your cardiac medications before your next appointment, please call your pharmacy.

## 2016-09-04 NOTE — Progress Notes (Signed)
Discussed results with patient

## 2016-09-15 DIAGNOSIS — J188 Other pneumonia, unspecified organism: Secondary | ICD-10-CM | POA: Diagnosis not present

## 2016-09-18 ENCOUNTER — Encounter: Payer: Self-pay | Admitting: Pulmonary Disease

## 2016-09-18 ENCOUNTER — Ambulatory Visit (INDEPENDENT_AMBULATORY_CARE_PROVIDER_SITE_OTHER): Payer: Medicare Other | Admitting: Pulmonary Disease

## 2016-09-18 VITALS — BP 108/76 | HR 110 | Ht 69.5 in | Wt 222.6 lb

## 2016-09-18 DIAGNOSIS — J9611 Chronic respiratory failure with hypoxia: Secondary | ICD-10-CM

## 2016-09-18 DIAGNOSIS — J209 Acute bronchitis, unspecified: Secondary | ICD-10-CM | POA: Diagnosis not present

## 2016-09-18 DIAGNOSIS — J439 Emphysema, unspecified: Secondary | ICD-10-CM | POA: Diagnosis not present

## 2016-09-18 MED ORDER — AZITHROMYCIN 250 MG PO TABS: ORAL_TABLET | ORAL | 0 refills | Status: DC

## 2016-09-18 NOTE — Patient Instructions (Signed)
Zithromax 250 mg >> 2 pills on day 1, then 1 pill daily for next 4 days  Call if not feeling better by end of the week  Follow up in 3 months

## 2016-09-18 NOTE — Progress Notes (Signed)
Current Outpatient Prescriptions on File Prior to Visit  Medication Sig  . aspirin 81 MG EC tablet Take 81 mg by mouth daily.    . chlorthalidone (HYGROTON) 25 MG tablet Take 1 tablet (25 mg total) by mouth daily.  . fexofenadine (ALLEGRA) 180 MG tablet Take 180 mg by mouth daily.    . fluticasone (FLONASE) 50 MCG/ACT nasal spray Place 2 sprays into both nostrils daily.  Marland Kitchen. glucose blood (ONETOUCH VERIO) test strip 1 each by Other route daily. And lancets 1/day  . Ipratropium-Albuterol (COMBIVENT RESPIMAT) 20-100 MCG/ACT AERS respimat Inhale 1 puff into the lungs every 6 (six) hours as needed for wheezing or shortness of breath.  . metFORMIN (GLUCOPHAGE) 1000 MG tablet TAKE 1 TABLET (1,000 MG TOTAL) BY MOUTH 2 (TWO) TIMES DAILY WITH A MEAL.  . NON FORMULARY Oxygen continuous 1.5 liters with activity   . Omega-3 Fatty Acids (FISH OIL PO) Take 1 capsule by mouth 2 (two) times daily.   No current facility-administered medications on file prior to visit.     Chief Complaint  Patient presents with  . Acute Visit    Pt c/o pains in lower chest around base of ribs and pain in shoulder blades x 4 days, constant. Pt c/o cough with very little mucus clear in color. Pt reports low grade temp and headaches. Pt notes some PND.     Pulmonary tests A1AT 08/12/08 >> A1AT 263 PFT 09/23/08 >> FEV1 2.89 (84%), FEV1% 78, TLC 5.28 (78%), DLCO 64% PFT 08/23/11 >> FEV1 3.68(110%), FEV1% 75, TLC 5.38(80%), DLCO 60%, no BD Spirometry 02/05/13 >> FEV1 3.62 (97%), FEV1% 79 PFT 03/12/14 >> FEV1 3.68 (109%), FEV1% 81, TLC 5.40 (81%), DLCO 71%, no BD  Past medical history GERD, Depression, HLD, HTN, DM  Past surgical history, Family history, Social history, Allergies reviewed  Vital signs BP 108/76 (BP Location: Left Arm, Cuff Size: Normal)   Pulse (!) 110   Ht 5' 9.5" (1.765 m)   Wt 222 lb 9.6 oz (101 kg)   SpO2 96%   BMI 32.40 kg/m   History of Present Illness: [name] is a 60 y.o. male former  smoker with Emphysema and exertional/nocturnal hypoxemia.  Has hx of steroid responsive pneumonitis in 2009.  He developed chest congestion with cough over past few days.  He has low grade fever.  He is bringing up clear to yellow sputum.  He has soreness along his ribs.  He denies hemoptysis, sinus congestion, sore throat, abdominal pain, diarrhea, skin rash, or leg swelling.  He did get his flu shot this season already.  He continues to use 1.5 liters oxygen.   Physical Exam:  General - wearing oxygen ENT - No sinus tenderness, no oral exudate, no LAN Cardiac - s1s2 regular, no murmur Chest - faint basilar crackles b/l, no wheeze Back - No focal tenderness Abd - Soft, non-tender Ext - No edema Neuro - Normal strength Skin - No rashes Psych - normal mood, and behavior   Assessment/Plan:  Acute bronchitis. - will give course of zithromax - defer prednisone, CXR, labs  Emphysema. - continue PRN combivent  Hx of steroid responsive ILD >> stable - continue clinical monitoring  Chronic respiratory failure with hypoxia - continue 1.5 liters oxygen with exertion   Patient Instructions  Zithromax 250 mg >> 2 pills on day 1, then 1 pill daily for next 4 days  Call if not feeling better by end of the week  Follow up in 3 months  Coralyn HellingVineet Gabrial Domine, MD [place] [facility] Pager:  [phone] 09/18/2016, 2:58 PM

## 2016-09-21 ENCOUNTER — Other Ambulatory Visit (HOSPITAL_COMMUNITY): Payer: Medicare Other

## 2016-09-22 ENCOUNTER — Telehealth: Payer: Self-pay | Admitting: Pulmonary Disease

## 2016-09-22 MED ORDER — DOXYCYCLINE HYCLATE 100 MG PO TABS: 100.0000 mg | ORAL_TABLET | Freq: Three times a day (TID) | ORAL | 0 refills | Status: DC

## 2016-09-22 MED ORDER — PREDNISONE 10 MG PO TABS: ORAL_TABLET | ORAL | 0 refills | Status: DC

## 2016-09-22 NOTE — Telephone Encounter (Signed)
Called and spoke with pt and he is aware of meds that have been sent to the pharmacy.  Nothing further is needed.  

## 2016-09-22 NOTE — Telephone Encounter (Signed)
Pt was seen in office on 09-18-16 and was prescribed z pak. Pt states he took his last does of abx today with no improvement. Pt states he woke this morning with increase chest congestion, prod cough with white to yellow mucus, low grade fever of 99 when checked this morning. Pt denies any sweats, chills or body aches. Pt requesting further recommendation.  VS please advise. Thanks.

## 2016-09-22 NOTE — Telephone Encounter (Signed)
Please send script for doxycycline 100 mg tid for 7 days.  Please send script for prednisone 10 mg >> 3 pills daily for 2 days, 2 pills daily for 2 days, 1 pill daily for 2 days.  #12 with no refills.

## 2016-10-16 DIAGNOSIS — J188 Other pneumonia, unspecified organism: Secondary | ICD-10-CM | POA: Diagnosis not present

## 2016-10-23 ENCOUNTER — Other Ambulatory Visit: Payer: Self-pay | Admitting: *Deleted

## 2016-10-31 ENCOUNTER — Other Ambulatory Visit: Payer: Medicare Other

## 2016-11-02 ENCOUNTER — Ambulatory Visit: Payer: Medicare Other | Admitting: Internal Medicine

## 2016-11-02 ENCOUNTER — Ambulatory Visit: Payer: Medicare Other | Admitting: Endocrinology

## 2016-11-02 ENCOUNTER — Other Ambulatory Visit: Payer: Self-pay | Admitting: Internal Medicine

## 2016-11-02 DIAGNOSIS — I10 Essential (primary) hypertension: Secondary | ICD-10-CM

## 2016-11-08 ENCOUNTER — Ambulatory Visit (INDEPENDENT_AMBULATORY_CARE_PROVIDER_SITE_OTHER): Payer: Medicare Other | Admitting: Internal Medicine

## 2016-11-08 ENCOUNTER — Other Ambulatory Visit (INDEPENDENT_AMBULATORY_CARE_PROVIDER_SITE_OTHER): Payer: Medicare Other

## 2016-11-08 DIAGNOSIS — N401 Enlarged prostate with lower urinary tract symptoms: Secondary | ICD-10-CM

## 2016-11-08 DIAGNOSIS — E785 Hyperlipidemia, unspecified: Secondary | ICD-10-CM

## 2016-11-08 DIAGNOSIS — N3943 Post-void dribbling: Secondary | ICD-10-CM | POA: Diagnosis not present

## 2016-11-08 DIAGNOSIS — E781 Pure hyperglyceridemia: Secondary | ICD-10-CM | POA: Diagnosis not present

## 2016-11-08 DIAGNOSIS — E118 Type 2 diabetes mellitus with unspecified complications: Secondary | ICD-10-CM | POA: Diagnosis not present

## 2016-11-08 DIAGNOSIS — Z0000 Encounter for general adult medical examination without abnormal findings: Secondary | ICD-10-CM

## 2016-11-08 DIAGNOSIS — R7989 Other specified abnormal findings of blood chemistry: Secondary | ICD-10-CM | POA: Diagnosis not present

## 2016-11-08 DIAGNOSIS — I10 Essential (primary) hypertension: Secondary | ICD-10-CM | POA: Diagnosis not present

## 2016-11-08 DIAGNOSIS — E7801 Familial hypercholesterolemia: Secondary | ICD-10-CM

## 2016-11-08 LAB — TSH: TSH: 1.55 u[IU]/mL (ref 0.35–4.50)

## 2016-11-08 LAB — COMPREHENSIVE METABOLIC PANEL
ALK PHOS: 69 U/L (ref 39–117)
ALT: 33 U/L (ref 0–53)
AST: 20 U/L (ref 0–37)
Albumin: 4.6 g/dL (ref 3.5–5.2)
BUN: 14 mg/dL (ref 6–23)
CO2: 27 mEq/L (ref 19–32)
Calcium: 10 mg/dL (ref 8.4–10.5)
Chloride: 98 mEq/L (ref 96–112)
Creatinine, Ser: 1.03 mg/dL (ref 0.40–1.50)
GFR: 77.55 mL/min (ref >60.00)
GLUCOSE: 99 mg/dL (ref 70–99)
POTASSIUM: 3.7 meq/L (ref 3.5–5.1)
Sodium: 134 mEq/L — ABNORMAL LOW (ref 135–145)
TOTAL PROTEIN: 6.9 g/dL (ref 6.0–8.3)
Total Bilirubin: 0.5 mg/dL (ref 0.2–1.2)

## 2016-11-08 LAB — URINALYSIS, ROUTINE W REFLEX MICROSCOPIC
Bilirubin Urine: NEGATIVE
Ketones, ur: NEGATIVE
LEUKOCYTES UA: NEGATIVE
Nitrite: NEGATIVE
PH: 6 (ref 5.0–8.0)
RBC / HPF: NONE SEEN (ref 0–?)
SPECIFIC GRAVITY, URINE: 1.02 (ref 1.000–1.030)
TOTAL PROTEIN, URINE-UPE24: NEGATIVE
URINE GLUCOSE: NEGATIVE
Urobilinogen, UA: 0.2 (ref 0.0–1.0)
WBC, UA: NONE SEEN (ref 0–?)

## 2016-11-08 LAB — CBC WITH DIFFERENTIAL/PLATELET
Basophils Absolute: 0.1 10*3/uL (ref 0.0–0.1)
Basophils Relative: 1.2 % (ref 0.0–3.0)
EOS PCT: 0.7 % (ref 0.0–5.0)
Eosinophils Absolute: 0.1 10*3/uL (ref 0.0–0.7)
HCT: 44.1 % (ref 39.0–52.0)
Hemoglobin: 15.4 g/dL (ref 13.0–17.0)
LYMPHS ABS: 2.5 10*3/uL (ref 0.7–4.0)
Lymphocytes Relative: 21.3 % (ref 12.0–46.0)
MCHC: 35 g/dL (ref 30.0–36.0)
MCV: 87.7 fl (ref 78.0–100.0)
MONO ABS: 1 10*3/uL (ref 0.1–1.0)
Monocytes Relative: 8.5 % (ref 3.0–12.0)
NEUTROS ABS: 8 10*3/uL — AB (ref 1.4–7.7)
NEUTROS PCT: 68.3 % (ref 43.0–77.0)
PLATELETS: 264 10*3/uL (ref 150.0–400.0)
RBC: 5.03 Mil/uL (ref 4.22–5.81)
RDW: 14.1 % (ref 11.5–15.5)
WBC: 11.6 10*3/uL — ABNORMAL HIGH (ref 4.0–10.5)

## 2016-11-08 LAB — LIPID PANEL
Cholesterol: 250 mg/dL — ABNORMAL HIGH (ref 0–200)
HDL: 40.1 mg/dL (ref >39.00)
NONHDL: 210.17
Total CHOL/HDL Ratio: 6
Triglycerides: 240 mg/dL — ABNORMAL HIGH (ref 0.0–149.0)
VLDL: 48 mg/dL — ABNORMAL HIGH (ref 0.0–40.0)

## 2016-11-08 LAB — MICROALBUMIN / CREATININE URINE RATIO
Creatinine,U: 182.4 mg/dL
MICROALB/CREAT RATIO: 0.5 mg/g (ref 0.0–30.0)
Microalb, Ur: 0.9 mg/dL (ref 0.0–1.9)

## 2016-11-08 LAB — PSA: PSA: 0.62 ng/mL (ref 0.10–4.00)

## 2016-11-08 LAB — LDL CHOLESTEROL, DIRECT: LDL DIRECT: 174 mg/dL

## 2016-11-08 LAB — HEMOGLOBIN A1C: Hgb A1c MFr Bld: 6.3 % (ref 4.6–6.5)

## 2016-11-08 MED ORDER — ALIROCUMAB 75 MG/ML ~~LOC~~ SOSY: 1.0000 | PREFILLED_SYRINGE | SUBCUTANEOUS | 3 refills | Status: DC

## 2016-11-08 NOTE — Patient Instructions (Signed)

## 2016-11-08 NOTE — Progress Notes (Signed)
Subjective:  Patient ID: [name], male    DOB: 12-05-1955  Age: 61 y.o. MRN: [id number]  CC: Annual Exam; Hyperlipidemia; Hypertension; and Diabetes   HPI [name] presents for an AWV/CPX.  He has tried multiple statins and cannot tolerate them. He has had an LDL as high as 207 so clearly he has familial hypercholesterolemia. His exam today shows xanthomas across his lower eyes and he has a history of diabetes. He agrees to start using the PCS K9 inhibitor.   Past Medical History:  Diagnosis Date  . COPD (chronic obstructive pulmonary disease) ([place]) 09/23/08   -PFT 09/23/08 FEV1 2.89(84%), FVC 3.70 (78%), TLC 5.28 (78%), DLCO 64% mixed obstructive and  restrictive pattern.  Spirometry 03/11/09 FEV 3.35 (97%),FVC 4.10 (86%), FEV% 82. Steroid responsive pneumonitis 04/09- required mechanical ventilation during hospitalization - The following hospital test were negative: PCP,ANA,HIV,BAL. - ACE25,RF20,CRP 28,A1AT 263, ESR 109  . Depression   . Diabetes mellitus without complication ([place])   . Dyslipidemia   . Dyslipidemia   . GERD (gastroesophageal reflux disease)   . History of skin cancer   . Hypertension    Echo 10/09 EF 60%  . ILD (interstitial lung disease) ([place])   . Panic attack    Past Surgical History:  Procedure Laterality Date  . BRONCHOSCOPY  10/09  . Ganglion cyst on foot    . HERNIA REPAIR     2 hernia  . Left shoulder surgery      reports that he quit smoking about 13 years ago. His smoking use included Cigarettes. He has a 16.00 pack-year smoking history. He has never used smokeless tobacco. He reports that he does not drink alcohol or use drugs. family history includes Diabetes in his brother and father; Emphysema in his brother; Heart attack in his brother; Heart attack (age of onset: 63) in his father. Allergies  Allergen Reactions  . Latex   . Morphine   . Statins     REACTION: weakness, muscle pains    Outpatient Medications Prior to Visit    Medication Sig Dispense Refill  . aspirin 81 MG EC tablet Take 81 mg by mouth daily.      . chlorthalidone (HYGROTON) 25 MG tablet TAKE 1 TABLET (25 MG TOTAL) BY MOUTH DAILY. 90 tablet 1  . fexofenadine (ALLEGRA) 180 MG tablet Take 180 mg by mouth daily.      . fluticasone (FLONASE) 50 MCG/ACT nasal spray Place 2 sprays into both nostrils daily. 16 g 2  . glucose blood (ONETOUCH VERIO) test strip 1 each by Other route daily. And lancets 1/day 100 each 12  . Ipratropium-Albuterol (COMBIVENT RESPIMAT) 20-100 MCG/ACT AERS respimat Inhale 1 puff into the lungs every 6 (six) hours as needed for wheezing or shortness of breath. 3 Inhaler 1  . metFORMIN (GLUCOPHAGE) 1000 MG tablet TAKE 1 TABLET (1,000 MG TOTAL) BY MOUTH 2 (TWO) TIMES DAILY WITH A MEAL. 180 tablet 1  . NON FORMULARY Oxygen continuous 1.5 liters with activity     . Omega-3 Fatty Acids (FISH OIL PO) Take 1 capsule by mouth 2 (two) times daily.    Marland Kitchen azithromycin (ZITHROMAX) 250 MG tablet 2 pills on day 1, then 1 pill daily for next 4 days 6 tablet 0  . doxycycline (VIBRA-TABS) 100 MG tablet Take 1 tablet (100 mg total) by mouth 3 (three) times daily. 21 tablet 0  . predniSONE (DELTASONE) 10 MG tablet Take 3 tabs x 2 days, 2 tabs x 2  days, 1 tab x 2 days 12 tablet 0   No facility-administered medications prior to visit.     ROS Review of Systems  Constitutional: Negative for appetite change, chills, diaphoresis, fatigue and unexpected weight change.  HENT: Negative.   Eyes: Negative for visual disturbance.  Respiratory: Positive for shortness of breath (baseline SOB). Negative for cough, chest tightness and wheezing.   Cardiovascular: Negative for chest pain, palpitations and leg swelling.  Gastrointestinal: Negative for abdominal pain, constipation, diarrhea, nausea and vomiting.  Endocrine: Negative.  Negative for cold intolerance, heat intolerance, polydipsia, polyphagia and polyuria.  Genitourinary: Negative.  Negative for  difficulty urinating, frequency, hematuria, scrotal swelling, testicular pain and urgency.  Musculoskeletal: Negative.  Negative for back pain and neck pain.  Skin: Negative.   Allergic/Immunologic: Negative.   Neurological: Negative.   Hematological: Negative.  Negative for adenopathy. Does not bruise/bleed easily.  Psychiatric/Behavioral: Negative.     Objective:  There were no vitals taken for this visit.  BP Readings from Last 3 Encounters:  09/18/16 108/76  08/31/16 126/90  08/08/16 132/90    Wt Readings from Last 3 Encounters:  09/18/16 222 lb 9.6 oz (101 kg)  08/31/16 225 lb 12.8 oz (102.4 kg)  08/08/16 224 lb (101.6 kg)    Physical Exam  Constitutional: He is oriented to person, place, and time. No distress.  HENT:  Mouth/Throat: Oropharynx is clear and moist. No oropharyngeal exudate.  + multiple xanthomas around his eyes  Eyes: Conjunctivae are normal. Right eye exhibits no discharge. Left eye exhibits no discharge. No scleral icterus.  Neck: Normal range of motion. Neck supple. No JVD present. No tracheal deviation present. No thyromegaly present.  Cardiovascular: Normal rate, regular rhythm, normal heart sounds and intact distal pulses.  Exam reveals no gallop and no friction rub.   No murmur heard. Pulmonary/Chest: Effort normal and breath sounds normal. No stridor. No respiratory distress. He has no wheezes. He has no rales. He exhibits no tenderness.  Abdominal: Soft. Bowel sounds are normal. He exhibits no distension and no mass. There is no tenderness. There is no rebound and no guarding. Hernia confirmed negative in the right inguinal area and confirmed negative in the left inguinal area.  Genitourinary: Rectum normal, testes normal and penis normal. Rectal exam shows no external hemorrhoid, no internal hemorrhoid, no fissure, no mass, no tenderness and guaiac negative stool. Prostate is enlarged (1+ smooth symm BPH). Prostate is not tender. Right testis shows no  mass, no swelling and no tenderness. Right testis is descended. Left testis shows no mass, no swelling and no tenderness. Left testis is descended. Circumcised. No penile erythema or penile tenderness. No discharge found.  Musculoskeletal: Normal range of motion. He exhibits no edema, tenderness or deformity.  Lymphadenopathy:    He has no cervical adenopathy.       Right: No inguinal adenopathy present.       Left: No inguinal adenopathy present.  Neurological: He is oriented to person, place, and time.  Skin: Skin is warm and dry. No rash noted. He is not diaphoretic. No erythema. No pallor.  Psychiatric: He has a normal mood and affect. His behavior is normal. Judgment and thought content normal.  Vitals reviewed.   Lab Results  Component Value Date   WBC 11.6 (H) 11/08/2016   HGB 15.4 11/08/2016   HCT 44.1 11/08/2016   PLT 264.0 11/08/2016   GLUCOSE 99 11/08/2016   CHOL 250 (H) 11/08/2016   TRIG 240.0 (H) 11/08/2016   HDL   40.10 11/08/2016   LDLDIRECT 174.0 11/08/2016   LDLCALC 196 (H) 07/13/2014   ALT 33 11/08/2016   AST 20 11/08/2016   NA 134 (L) 11/08/2016   K 3.7 11/08/2016   CL 98 11/08/2016   CREATININE 1.03 11/08/2016   BUN 14 11/08/2016   CO2 27 11/08/2016   TSH 1.55 11/08/2016   PSA 0.62 11/08/2016   INR 1.2 08/04/2008   HGBA1C 6.3 11/08/2016   MICROALBUR 0.9 11/08/2016    Dg Chest 2 View  Result Date: 02/09/2014 CLINICAL DATA:  COPD.  Former smoker. EXAM: CHEST  2 VIEW COMPARISON:  None. FINDINGS: The heart size and mediastinal contours are within normal limits. Marked coarsened interstitial markings are identified bilaterally. No superimposed airspace consolidation identified. The visualized skeletal structures are unremarkable. IMPRESSION: 1. Chronic interstitial lung disease. 2. No acute cardiopulmonary abnormalities. Electronically Signed   By: Taylor  Stroud M.D.   On: 02/09/2014 14:32    Assessment & Plan:   [name] was seen today for annual exam,  hyperlipidemia, hypertension and diabetes.  Diagnoses and all orders for this visit:  Essential hypertension, benign- his blood pressure is well-controlled, electrolytes and renal function are normal -     Comprehensive metabolic panel; Future -     CBC with Differential/Platelet; Future  Pure hyperglyceridemia- his triglycerides are mildly elevated but do not require any medical therapy, he will continue with the omega-3 fatty acids and working on his lifestyle modifications. -     Lipid panel; Future  Hyperlipidemia with target LDL less than 130- he agrees to start using a PACS K9 inhibitor -     Lipid panel; Future -     TSH; Future -     Discontinue: Alirocumab (PRALUENT) 75 MG/ML SOSY; Inject 1 Act into the skin every 14 (fourteen) days. -     Alirocumab (PRALUENT) 75 MG/ML SOSY; Inject 1 Act into the skin every 14 (fourteen) days.  Benign prostatic hyperplasia with post-void dribbling- he has no symptoms that need to be treated and his PSA is normal so not concerned about prostate cancer. -     Urinalysis, Routine w reflex microscopic; Future -     PSA; Future  Type 2 diabetes mellitus with complication, without long-term current use of insulin (HCC)- his A1c is 6.3%, his blood sugars are adequately well controlled. -     Comprehensive metabolic panel; Future -     Hemoglobin A1c; Future -     Microalbumin / creatinine urine ratio; Future  Familial hypercholesteremia -     Discontinue: Alirocumab (PRALUENT) 75 MG/ML SOSY; Inject 1 Act into the skin every 14 (fourteen) days. -     Alirocumab (PRALUENT) 75 MG/ML SOSY; Inject 1 Act into the skin every 14 (fourteen) days.   I have discontinued [name]'s azithromycin, doxycycline, and predniSONE. I am also having him maintain his aspirin, NON FORMULARY, fexofenadine, fluticasone, Ipratropium-Albuterol, glucose blood, Omega-3 Fatty Acids (FISH OIL PO), metFORMIN, chlorthalidone, and Alirocumab.  Meds ordered this encounter    Medications  . DISCONTD: Alirocumab (PRALUENT) 75 MG/ML SOSY    Sig: Inject 1 Act into the skin every 14 (fourteen) days.    Dispense:  6 Syringe    Refill:  3  . Alirocumab (PRALUENT) 75 MG/ML SOSY    Sig: Inject 1 Act into the skin every 14 (fourteen) days.    Dispense:  6 Syringe    Refill:  3   See AVS for instructions about healthy living and anticipatory guidance.  Follow-up:   Return in about 3 months (around 02/06/2017).  Thomas Jones, MD 

## 2016-11-12 ENCOUNTER — Encounter: Payer: Self-pay | Admitting: Internal Medicine

## 2016-11-12 NOTE — Assessment & Plan Note (Signed)

## 2016-11-16 ENCOUNTER — Telehealth: Payer: Self-pay

## 2016-11-16 DIAGNOSIS — J188 Other pneumonia, unspecified organism: Secondary | ICD-10-CM | POA: Diagnosis not present

## 2016-11-16 DIAGNOSIS — E1169 Type 2 diabetes mellitus with other specified complication: Secondary | ICD-10-CM

## 2016-11-16 DIAGNOSIS — E785 Hyperlipidemia, unspecified: Principal | ICD-10-CM

## 2016-11-16 NOTE — Telephone Encounter (Signed)
PA started  Key [id number]

## 2016-11-17 NOTE — Telephone Encounter (Signed)
Additional information for PA has been submitted. Awaiting Ins response.

## 2016-11-22 ENCOUNTER — Ambulatory Visit (INDEPENDENT_AMBULATORY_CARE_PROVIDER_SITE_OTHER): Payer: Medicare Other | Admitting: Endocrinology

## 2016-11-22 ENCOUNTER — Encounter: Payer: Self-pay | Admitting: Endocrinology

## 2016-11-22 VITALS — BP 126/78 | HR 104 | Ht 69.0 in | Wt 221.0 lb

## 2016-11-22 DIAGNOSIS — E118 Type 2 diabetes mellitus with unspecified complications: Secondary | ICD-10-CM | POA: Diagnosis not present

## 2016-11-22 NOTE — Progress Notes (Signed)
Subjective:    Patient ID: [name], male    DOB: April 08, 1956, 61 y.o.   MRN: [id number]  HPI Pt returns for f/u of diabetes mellitus: DM type: 2 Dx'ed: 2025 Complications: polyneuropathy Therapy: metformin DKA: never Severe hypoglycemia: never.   Pancreatitis: never Other: he has never been on insulin (except while in the hospital on steroids). Interval history: He stopped pioglitizone in late 2017.  He took prednisone for a brief time in late 2017, also.  Past Medical History:  Diagnosis Date  . COPD (chronic obstructive pulmonary disease) ([place]) 09/23/08   -PFT 09/23/08 FEV1 2.89(84%), FVC 3.70 (78%), TLC 5.28 (78%), DLCO 64% mixed obstructive and  restrictive pattern.  Spirometry 03/11/09 FEV 3.35 (97%),FVC 4.10 (86%), FEV% 82. Steroid responsive pneumonitis 04/09- required mechanical ventilation during hospitalization - The following hospital test were negative: PCP,ANA,HIV,BAL. - ACE25,RF20,CRP 28,A1AT 263, ESR 109  . Depression   . Diabetes mellitus without complication ([place])   . Dyslipidemia   . Dyslipidemia   . GERD (gastroesophageal reflux disease)   . History of skin cancer   . Hypertension    Echo 10/09 EF 60%  . ILD (interstitial lung disease) ([place])   . Panic attack     Past Surgical History:  Procedure Laterality Date  . BRONCHOSCOPY  10/09  . Ganglion cyst on foot    . HERNIA REPAIR     2 hernia  . Left shoulder surgery      Social History   Social History  . Marital status: Divorced    Spouse name: N/A  . Number of children: N/A  . Years of education: N/A   Occupational History  . Theme park manager Unemployed   Social History Main Topics  . Smoking status: Former Smoker    Packs/day: 1.00    Years: 16.00    Types: Cigarettes    Quit date: 10/17/2003  . Smokeless tobacco: Never Used  . Alcohol use No     Comment: Rare beer 3-4 times per year  . Drug use: No  . Sexual activity: Not Currently   Other Topics Concern  . Not on file   Social  History Narrative   Divorced. No Children   Drug use-No   Regular exercise-yes   Lives alone    Current Outpatient Prescriptions on File Prior to Visit  Medication Sig Dispense Refill  . Alirocumab (PRALUENT) 75 MG/ML SOSY Inject 1 Act into the skin every 14 (fourteen) days. 6 Syringe 3  . aspirin 81 MG EC tablet Take 81 mg by mouth daily.      . chlorthalidone (HYGROTON) 25 MG tablet TAKE 1 TABLET (25 MG TOTAL) BY MOUTH DAILY. 90 tablet 1  . fexofenadine (ALLEGRA) 180 MG tablet Take 180 mg by mouth daily.      . fluticasone (FLONASE) 50 MCG/ACT nasal spray Place 2 sprays into both nostrils daily. 16 g 2  . glucose blood (ONETOUCH VERIO) test strip 1 each by Other route daily. And lancets 1/day 100 each 12  . Ipratropium-Albuterol (COMBIVENT RESPIMAT) 20-100 MCG/ACT AERS respimat Inhale 1 puff into the lungs every 6 (six) hours as needed for wheezing or shortness of breath. 3 Inhaler 1  . metFORMIN (GLUCOPHAGE) 1000 MG tablet TAKE 1 TABLET (1,000 MG TOTAL) BY MOUTH 2 (TWO) TIMES DAILY WITH A MEAL. 180 tablet 1  . NON FORMULARY Oxygen continuous 1.5 liters with activity     . Omega-3 Fatty Acids (FISH OIL PO) Take 1 capsule by mouth 2 (two) times  daily.     No current facility-administered medications on file prior to visit.     Allergies  Allergen Reactions  . Latex   . Morphine   . Statins     REACTION: weakness, muscle pains    Family History  Problem Relation Age of Onset  . Heart attack Father 80  . Diabetes Father   . Heart attack Brother   . Emphysema Brother   . Diabetes Brother     BP 126/78   Pulse (!) 104   Ht _0  (1.753 m)   Wt 221 lb (100.2 kg)   SpO2 96% Comment: On 1.5 L of 02  BMI 32.64 kg/m    Review of Systems He denies hypoglycemia    Objective:   Physical Exam VITAL SIGNS:  See vs page GENERAL: no distress Pulses: dorsalis pedis intact bilat.   MSK: no deformity of the feet CV: no leg edema Skin:  no ulcer on the feet.  normal color and  temp on the feet. Neuro: sensation is intact to touch on the feet   Lab Results  Component Value Date   HGBA1C 6.3 11/08/2016      Assessment & Plan:  Type 2 DM: well-controlled.  Patient is advised the following: Patient Instructions  check your blood sugar once a day.  vary the time of day when you check, between before the 3 meals, and at bedtime.  also check if you have symptoms of your blood sugar being too high or too low.  please keep a record of the readings and bring it to your next appointment here (or you can bring the meter itself).  You can write it on any piece of paper.  please call us sooner if your blood sugar goes below 70, or if you have a lot of readings over 200.   Please continue the same metformin.  Please come back for a follow-up appointment in 6 months.

## 2016-11-22 NOTE — Patient Instructions (Addendum)
check your blood sugar once a day.  vary the time of day when you check, between before the 3 meals, and at bedtime.  also check if you have symptoms of your blood sugar being too high or too low.  please keep a record of the readings and bring it to your next appointment here (or you can bring the meter itself).  You can write it on any piece of paper.  please call us sooner if your blood sugar goes below 70, or if you have a lot of readings over 200.  Please continue the same metformin. Please come back for a follow-up appointment in 6 months.   

## 2016-11-23 ENCOUNTER — Ambulatory Visit (HOSPITAL_COMMUNITY): Payer: Medicare Other | Attending: Internal Medicine

## 2016-11-23 ENCOUNTER — Other Ambulatory Visit: Payer: Self-pay

## 2016-11-23 DIAGNOSIS — I5189 Other ill-defined heart diseases: Secondary | ICD-10-CM | POA: Diagnosis not present

## 2016-11-23 DIAGNOSIS — R0602 Shortness of breath: Secondary | ICD-10-CM | POA: Diagnosis not present

## 2016-11-23 DIAGNOSIS — E782 Mixed hyperlipidemia: Secondary | ICD-10-CM | POA: Diagnosis not present

## 2016-11-23 DIAGNOSIS — I77819 Aortic ectasia, unspecified site: Secondary | ICD-10-CM | POA: Insufficient documentation

## 2016-11-24 NOTE — Telephone Encounter (Signed)
PA was denied.   Pt informed of same. Please advise if there is an alternative.

## 2016-11-25 NOTE — Telephone Encounter (Signed)
He needs to go back to be seen in the cardiology lipids clinic

## 2016-12-01 NOTE — Telephone Encounter (Signed)
Pt contacted and informed. Referral entered.

## 2016-12-08 ENCOUNTER — Encounter: Payer: Self-pay | Admitting: Internal Medicine

## 2016-12-10 NOTE — Progress Notes (Signed)
Follow-up Outpatient Visit Date: 12/11/2016  Primary Care Provider: Scarlette Calico, MD [address] [address]  Chief Complaint: Follow-up hyperlipidemia  HPI:  [name] is a 61 y.o. year-old male with history of type 2 diabetes mellitus, HTN, hyperlipidemia, GERD, depression/anxiety, COPD, and interstitial lung disease, who presents for follow-up of hyperlipidemia. I met [name] on 08/31/16 for evaluation of hyperlipidemia with history of statin intolerance. He had experienced myalgias with statin use many years ago. We agreed to a trial of rosuvastatin 5 mg three days per week. It appears that since our last visit, he was switched to alirocumab by Dr. Ronnald Ramp. We also obtained a transthoracic echo to evaluate for potential structural heart disease contributing to his dyspnea. [name] reports that his shortness of breath has been about the same. He continues to wear supplemental oxygen whenever he goes out of the house or does significant activity at home. He has not had any chest pain, palpitations, lightheadedness, orthopnea, or leg edema.  At our last visit, we initiated rosuvastatin 5 mg 3 days a week (Monday, Wednesday, and Friday), which the patient continued for about 3 weeks. He noticed significant discomfort in both legs and wrists making it difficult for him to carry out his usual activities. Even tasks such as turning a doorknob became difficult. Shortly after discontinuing the medication, his pain resolved. The patient was started on alirocumab by Dr. Ronnald Ramp, though he has been unable to continue the medication due to insurance denial. He tolerated the medication/injections well for the 2 doses he received.  [name] reports that his blood pressure has been variable at home, ranging anywhere from normal to mildly elevated. He has been compliant with his medications. He is currently not on an ACE inhibitor or ARB. It appears that his ARB was discontinued last  summer due to hypotension.  --------------------------------------------------------------------------------------------------  Cardiovascular History & Procedures: Cardiovascular Problems:  Chronic shortness of breath  Hyperlipidemia with statin intolerance  Chest pain  Risk Factors:  DM, HTN, hyperlipidemia, male gender, and age > 27     Cath/PCI:  LHC (11/15/05): LMCA normal. LAD, LCx, and RCA with mild luminal irregularities. Normal LVEF by ventriculogram.  CV Surgery:  None  EP Procedures and Devices:  None  Non-Invasive Evaluation(s):  TTE (11/23/16): Normal LV size with mild LVH. LVEF 60-65% with grade 1 diastolic dysfunction. Mildly dilated aortic root. Mitral annular calcification. Normal RV size and function. Normal pulmonary artery pressure.  Recent CV Pertinent Labs: Lab Results  Component Value Date   CHOL 250 (H) 11/08/2016   HDL 40.10 11/08/2016   LDLCALC 196 (H) 07/13/2014   LDLDIRECT 174.0 11/08/2016   TRIG 240.0 (H) 11/08/2016   CHOLHDL 6 11/08/2016   INR 1.2 08/04/2008   K 3.7 11/08/2016   MG 2.5 08/15/2008   BUN 14 11/08/2016   CREATININE 1.03 11/08/2016    Past medical and surgical history were reviewed and updated in EPIC.  Outpatient Encounter Prescriptions as of 12/11/2016  Medication Sig  . aspirin 81 MG EC tablet Take 81 mg by mouth daily.    . chlorthalidone (HYGROTON) 25 MG tablet TAKE 1 TABLET (25 MG TOTAL) BY MOUTH DAILY.  . fexofenadine (ALLEGRA) 180 MG tablet Take 180 mg by mouth daily as needed for allergies.   . fluticasone (FLONASE) 50 MCG/ACT nasal spray Place 1 spray into both nostrils daily as needed for allergies or rhinitis.  Marland Kitchen glucose blood (ONETOUCH VERIO) test strip 1 each by Other  route daily. And lancets 1/day  . Ipratropium-Albuterol (COMBIVENT RESPIMAT) 20-100 MCG/ACT AERS respimat Inhale 1 puff into the lungs every 6 (six) hours as needed for wheezing or shortness of breath.  . metFORMIN (GLUCOPHAGE) 1000 MG  tablet TAKE 1 TABLET (1,000 MG TOTAL) BY MOUTH 2 (TWO) TIMES DAILY WITH A MEAL.  . NON FORMULARY Oxygen continuous 1.5 liters with activity   . Omega-3 Fatty Acids (FISH OIL PO) Take 1 capsule by mouth 2 (two) times daily.  . Alirocumab (PRALUENT) 75 MG/ML SOSY Inject 1 Act into the skin every 14 (fourteen) days. (Patient not taking: Reported on 12/11/2016)  . [DISCONTINUED] fluticasone (FLONASE) 50 MCG/ACT nasal spray Place 2 sprays into both nostrils daily. (Patient not taking: Reported on 12/11/2016)   No facility-administered encounter medications on file as of 12/11/2016.     Allergies: Latex; Morphine; and Statins  Social History   Social History  . Marital status: Divorced    Spouse name: N/A  . Number of children: N/A  . Years of education: N/A   Occupational History  . Theme park manager Unemployed   Social History Main Topics  . Smoking status: Former Smoker    Packs/day: 1.00    Years: 16.00    Types: Cigarettes    Quit date: 10/17/2003  . Smokeless tobacco: Never Used  . Alcohol use No     Comment: Rare beer 3-4 times per year  . Drug use: No  . Sexual activity: Not Currently   Other Topics Concern  . Not on file   Social History Narrative   Divorced. No Children   Drug use-No   Regular exercise-yes   Lives alone    Family History  Problem Relation Age of Onset  . Heart attack Father 35  . Diabetes Father   . Heart attack Brother   . Emphysema Brother   . Diabetes Brother     Review of Systems: A 12-system review of systems was performed and was negative except as noted in the HPI.  --------------------------------------------------------------------------------------------------  Physical Exam: BP 122/78 (BP Location: Left Arm)   Pulse 97   Ht 5' 9" (1.753 m)   Wt 220 lb (99.8 kg)   BMI 32.49 kg/m   General:  Obese man, seated comfortably in the exam room. Neck: Supple without lymphadenopathy, thyromegaly, JVD, or HJR.  No carotid bruit. Lungs:  Normal work of breathing.  Mildly diminished breath sounds throughout. No wheezes or crackles. Heart: Regular rate and rhythm without murmurs, rubs, or gallops.  Non-displaced PMI. Abd: Bowel sounds present.  Soft, NT/ND without hepatosplenomegaly Ext: No lower extremity edema.  Radial, PT, and DP pulses are 2+ bilaterally. Skin: warm and dry without rash  Lab Results  Component Value Date   WBC 11.6 (H) 11/08/2016   HGB 15.4 11/08/2016   HCT 44.1 11/08/2016   MCV 87.7 11/08/2016   PLT 264.0 11/08/2016    Lab Results  Component Value Date   NA 134 (L) 11/08/2016   K 3.7 11/08/2016   CL 98 11/08/2016   CO2 27 11/08/2016   BUN 14 11/08/2016   CREATININE 1.03 11/08/2016   GLUCOSE 99 11/08/2016   ALT 33 11/08/2016    Lab Results  Component Value Date   CHOL 250 (H) 11/08/2016   HDL 40.10 11/08/2016   LDLCALC 196 (H) 07/13/2014   LDLDIRECT 174.0 11/08/2016   TRIG 240.0 (H) 11/08/2016   CHOLHDL 6 11/08/2016    --------------------------------------------------------------------------------------------------  ASSESSMENT AND PLAN: Hyperlipidemia Mr. Orihuela continues to have  significant hyperlipidemia with most recent direct LDL in 10/2016 of 174, down from 207 in 07/2016. He had been on rosuvastatin 5 mg 3 days a week during that time but was forced to discontinue it after a few weeks due to significant myalgias. Given that his direct LDL off any statin therapy is greater than 190 and he has a family history of premature coronary artery disease in his father, he likely has heterozygous familial hypercholesterolemia, as detailed by Dr. Ronnald Ramp. I therefore agreed that Mr. Delashmit would benefit from PCSK9 inhibitor therapy. Unfortunately, he has been unable to continue alirocumab due to insurance issues. We will refer him to the lipid clinic to assist with ongoing PCSk9 inhibitor therapy and possible enrollment in a clinical trial, if he is a candidate. Should he be unable to qualify for  this, we could consider ezetimibe. I will defer this to the lipid clinic.  Shortness of breath This is a chronic problem, likely due to his underlying pulmonary disease. Echocardiogram did not reveal any significant abnormalities. No further cardiac evaluation or intervention at this time.  Hypertension Blood pressure is well controlled today on chlorthalidone. Patient not currently on an ACE inhibitor or ARB with history of diabetes. We will defer further management to Drs. Lurena Joiner.  Follow-up: Return to clinic in 6 months.  Nelva Bush, MD 12/11/2016 9:54 AM

## 2016-12-11 ENCOUNTER — Encounter: Payer: Self-pay | Admitting: Internal Medicine

## 2016-12-11 ENCOUNTER — Encounter (INDEPENDENT_AMBULATORY_CARE_PROVIDER_SITE_OTHER): Payer: Self-pay

## 2016-12-11 ENCOUNTER — Ambulatory Visit (INDEPENDENT_AMBULATORY_CARE_PROVIDER_SITE_OTHER): Payer: Medicare Other | Admitting: Internal Medicine

## 2016-12-11 VITALS — BP 122/78 | HR 97 | Ht 69.0 in | Wt 220.0 lb

## 2016-12-11 DIAGNOSIS — I712 Thoracic aortic aneurysm, without rupture, unspecified: Secondary | ICD-10-CM

## 2016-12-11 NOTE — Patient Instructions (Signed)
Medication Instructions:  Your physician recommends that you continue on your current medications as directed. Please refer to the Current Medication list given to you today.   Labwork: None   Testing/Procedures: Schedule an appointment for a CTA of your chest and abdomen. You may have liquids. No solid food 2 hours prior to the appointment.  Follow-Up: Your physician recommends that you schedule an appointment in the Lipid Clinic--ASAP.   Your physician wants you to follow-up in: 6 months with Dr End. (August 2018).  You will receive a reminder letter in the mail two months in advance. If you don't receive a letter, please call our office to schedule the follow-up appointment.         If you need a refill on your cardiac medications before your next appointment, please call your pharmacy.

## 2016-12-14 ENCOUNTER — Encounter (INDEPENDENT_AMBULATORY_CARE_PROVIDER_SITE_OTHER): Payer: Self-pay

## 2016-12-14 ENCOUNTER — Ambulatory Visit (INDEPENDENT_AMBULATORY_CARE_PROVIDER_SITE_OTHER): Payer: Medicare Other | Admitting: Pharmacist

## 2016-12-14 DIAGNOSIS — E7801 Familial hypercholesterolemia: Secondary | ICD-10-CM

## 2016-12-14 DIAGNOSIS — J188 Other pneumonia, unspecified organism: Secondary | ICD-10-CM | POA: Diagnosis not present

## 2016-12-14 NOTE — Progress Notes (Signed)
Patient ID: [name]                 DOB: [birth year]                    MRN: [id number]     HPI: [name] is a 61 y.o. male patient referred to lipid clinic by Dr End. PMH is significant for DM2, HTN, HLD, GERD, depression/anxiety, and COPD. He has a history of statin intolerance and was started on Crestor 26m 3x per week in November 2017. Pt continued therapy for about 3 weeks. He noticed significant discomfort in both legs and wrists making it difficult for him to carry out his usual activities. Even tasks such as turning a doorknob became difficult. Shortly after discontinuing the medication, his pain resolved. The patient was started on Praluent by Dr. JRonnald Rampand received 2 samples (last injection 2 weeks ago), but they were unable to gain insurance approval.  Pt has been taking statins on and off for the past 15 or so years. He took Lipitor either 20 or 434mdaily over 12 years ago but isn't sure of the dose since it was so long ago. Records are unavailable. He took Lipitor for over 2 years before developing myalgias in his legs and joint pain in his wrists that affected mobility. Most recently, he tried Crestor 3m52mx per week which caused the same symptoms. His symptoms resolved within 2-3 months of drug discontinuation in each case.  Patient has a very strong family history of ASCVD. His father had 4-5 heart attacks and passed from an MI at age 51.50is brother developed CAD in his 40s52snd his other brother had open heart surgery in his 50s32st's baseline LDL is > 200.   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 He tests positive for familial hypercholesterolemia using the [place] scoring tool: 1 point for first degree relative with premature CAD, 6 points for tendon xanthomas/xanthelasmas around his eyes, and 3 points for baseline LDL of 190-249. His total score is 10 which  gives him a definite diagnosis of familial hypercholesterolemia.  Current Medications: fish oil 1g BID Intolerances: Crestor 3mg21m per week - myalgias, Zetia 10mg73mly - myalgias and expensive, Welchol 3.75g daily - constipation, red yeast rice Risk Factors: familial hypercholesterolemia, DM, HTN, HLD, male sex, age LDL goal: <100mg/48mor primary prevention with risk factors  Family History: Father had an MI at age 69 and342 and3assed at age 51, al35 has DM. Brother with DM and MI also - in his 60s, f57st diagnosed with CAD in his 40s. A64sher brother in his 50s wi38sCAD s/p open heart surgery.  Social History: Former smoker 1 PPD for 16 years, quit in 2005. Denies alcohol and illicit drug use.  Labs: 10/2016: TC 250, TG 240, HDL 40.1, LDL-D 174 (fish oil and Crestor 3mg 3x44mr week) 07/2016: TC 279, TG 286, HDL 45.2, LDL 207 (fish oil and red yeast rice)  Past Medical History:  Diagnosis Date  . COPD (chronic obstructive pulmonary disease) (HCC) 12[place]/09   -PFT 09/23/08 FEV1 2.89(84%), FVC 3.70 (78%), TLC 5.28 (78%), DLCO 64% mixed obstructive and  restrictive pattern.  Spirometry 03/11/09 FEV 3.35 (97%),FVC 4.10 (86%), FEV% 82. Steroid responsive pneumonitis 04/09- required mechanical ventilation during hospitalization - The following hospital test were negative: PCP,ANA,HIV,BAL. -  ACE25,RF20,CRP 28,A1AT 263, ESR 109  . Depression   . Diabetes mellitus without complication ([facility])   . Dyslipidemia   . Dyslipidemia   . GERD (gastroesophageal reflux disease)   . History of skin cancer   . Hypertension    Echo 10/09 EF 60%  . ILD (interstitial lung disease) ([facility])   . Panic attack     Current Outpatient Prescriptions on File Prior to Visit  Medication Sig Dispense Refill  . Alirocumab (PRALUENT) 75 MG/ML SOSY Inject 1 Act into the skin every 14 (fourteen) days. (Patient not taking: Reported on 12/11/2016) 6 Syringe 3  . aspirin 81 MG EC tablet Take 81 mg by mouth daily.      .  chlorthalidone (HYGROTON) 25 MG tablet TAKE 1 TABLET (25 MG TOTAL) BY MOUTH DAILY. 90 tablet 1  . fexofenadine (ALLEGRA) 180 MG tablet Take 180 mg by mouth daily as needed for allergies.     . fluticasone (FLONASE) 50 MCG/ACT nasal spray Place 1 spray into both nostrils daily as needed for allergies or rhinitis.    Marland Kitchen glucose blood (ONETOUCH VERIO) test strip 1 each by Other route daily. And lancets 1/day 100 each 12  . Ipratropium-Albuterol (COMBIVENT RESPIMAT) 20-100 MCG/ACT AERS respimat Inhale 1 puff into the lungs every 6 (six) hours as needed for wheezing or shortness of breath. 3 Inhaler 1  . metFORMIN (GLUCOPHAGE) 1000 MG tablet TAKE 1 TABLET (1,000 MG TOTAL) BY MOUTH 2 (TWO) TIMES DAILY WITH A MEAL. 180 tablet 1  . NON FORMULARY Oxygen continuous 1.5 liters with activity     . Omega-3 Fatty Acids (FISH OIL PO) Take 1 capsule by mouth 2 (two) times daily.     No current facility-administered medications on file prior to visit.     Allergies  Allergen Reactions  . Latex   . Morphine   . Statins     REACTION: weakness, muscle pains    Assessment/Plan:  1. Hyperlipidemia - Patient tests positive for familial hypercholesterolemia using the [place] criteria scoring tool - 10 points for significant family history, tendon xanthomas/xanthelasmas around his eyes, and LDL > 200. He is intolerant to atorvastatin and rosuvastatin 19m 3x per week. PCSK9i therapy is the only option to bring LDL to goal <100 for primary prevention with multiple risk factors. Discussed expected benefit, side effects, and injection technique of PCSK9i. Pt is familiar with this since his PCP provided him with samples but was unable to gain insurance approval. Will need to pursue patient assistance since copay will be expensive given Medicare Part D. He should qualify for free Repatha but wouldn't for Praluent due to differences between patient assistance criteria. He is ok with switching to Repatha and was provided with  samples for the next month while we await patient assistance. Will submit prior authorization for Repatha and follow up with patient.   [name], PharmD, CPP, [id number] [place] [address]Phone: [phone] Fax: [phone]/10/2016 12:30 PM   Addendum: Repatha approved through patient's insurance for 6 months. Will submit patient assistance paperwork.

## 2016-12-14 NOTE — Patient Instructions (Signed)
I will start paperwork for Repatha injections for your cholesterol  I will submit your information for patient assistance - it usually takes over a month to hear back from them  Call Silvino Selman in the cholesterol clinic with any questions if you have not heard from the [employer] by mid April

## 2016-12-19 ENCOUNTER — Ambulatory Visit (INDEPENDENT_AMBULATORY_CARE_PROVIDER_SITE_OTHER)
Admission: RE | Admit: 2016-12-19 | Discharge: 2016-12-19 | Disposition: A | Discharge status: Home / Self Care | Payer: Medicare Other | Source: Ambulatory Visit | Attending: Internal Medicine | Admitting: Internal Medicine

## 2016-12-19 DIAGNOSIS — I712 Thoracic aortic aneurysm, without rupture, unspecified: Secondary | ICD-10-CM

## 2016-12-19 DIAGNOSIS — R918 Other nonspecific abnormal finding of lung field: Secondary | ICD-10-CM | POA: Diagnosis not present

## 2016-12-19 DIAGNOSIS — K5730 Diverticulosis of large intestine without perforation or abscess without bleeding: Secondary | ICD-10-CM | POA: Diagnosis not present

## 2016-12-19 MED ORDER — IOPAMIDOL (ISOVUE-370) INJECTION 76%
100.0000 mL | Freq: Once | INTRAVENOUS | Status: AC | PRN
  Administered 2016-12-19: 100 mL via INTRAVENOUS

## 2016-12-21 ENCOUNTER — Telehealth: Payer: Self-pay | Admitting: *Deleted

## 2016-12-21 DIAGNOSIS — I7789 Other specified disorders of arteries and arterioles: Secondary | ICD-10-CM

## 2016-12-21 NOTE — Telephone Encounter (Signed)
Notes Recorded by Yvonne Kendallhristopher End, MD on 12/19/2016 at 9:50 PM EST Please let [name] know that his CT scan shows borderline enlargement of the thoracic aorta. We do not need to pursue any intervention at this time. We will plan to repeat a CT in about a year to ensure that it has not enlarged.

## 2016-12-25 ENCOUNTER — Ambulatory Visit: Payer: Medicare Other | Admitting: Pulmonary Disease

## 2016-12-26 ENCOUNTER — Ambulatory Visit (INDEPENDENT_AMBULATORY_CARE_PROVIDER_SITE_OTHER): Payer: Medicare Other | Admitting: Pulmonary Disease

## 2016-12-26 ENCOUNTER — Encounter: Payer: Self-pay | Admitting: Pulmonary Disease

## 2016-12-26 VITALS — BP 110/90 | HR 112

## 2016-12-26 DIAGNOSIS — J438 Other emphysema: Secondary | ICD-10-CM

## 2016-12-26 DIAGNOSIS — J9611 Chronic respiratory failure with hypoxia: Secondary | ICD-10-CM | POA: Diagnosis not present

## 2016-12-26 DIAGNOSIS — R21 Rash and other nonspecific skin eruption: Secondary | ICD-10-CM

## 2016-12-26 MED ORDER — DIPHENHYDRAMINE HCL 25 MG PO TABS: 25.0000 mg | ORAL_TABLET | Freq: Four times a day (QID) | ORAL | 0 refills | Status: DC | PRN

## 2016-12-26 MED ORDER — PREDNISONE 20 MG PO TABS: 20.0000 mg | ORAL_TABLET | Freq: Every day | ORAL | 0 refills | Status: DC

## 2016-12-26 MED ORDER — CAMPHOR-MENTHOL 0.5-0.5 % EX LOTN: TOPICAL_LOTION | CUTANEOUS | Status: DC | PRN

## 2016-12-26 NOTE — Patient Instructions (Signed)
Prednisone 20 mg pill daily for 5 days  Benadryl 25 to 50 mg every 6 hours as needed for rash and itching  Sarna lotion as needed to rash  Follow up in 1 year

## 2016-12-26 NOTE — Progress Notes (Signed)
Current Outpatient Prescriptions on File Prior to Visit  Medication Sig  . aspirin 81 MG EC tablet Take 81 mg by mouth daily.    . chlorthalidone (HYGROTON) 25 MG tablet TAKE 1 TABLET (25 MG TOTAL) BY MOUTH DAILY.  . fexofenadine (ALLEGRA) 180 MG tablet Take 180 mg by mouth daily as needed for allergies.   . fluticasone (FLONASE) 50 MCG/ACT nasal spray Place 1 spray into both nostrils daily as needed for allergies or rhinitis.  Marland Kitchen. glucose blood (ONETOUCH VERIO) test strip 1 each by Other route daily. And lancets 1/day  . Ipratropium-Albuterol (COMBIVENT RESPIMAT) 20-100 MCG/ACT AERS respimat Inhale 1 puff into the lungs every 6 (six) hours as needed for wheezing or shortness of breath.  . metFORMIN (GLUCOPHAGE) 1000 MG tablet TAKE 1 TABLET (1,000 MG TOTAL) BY MOUTH 2 (TWO) TIMES DAILY WITH A MEAL.  . NON FORMULARY Oxygen continuous 1.5 liters with activity   . Omega-3 Fatty Acids (FISH OIL PO) Take 1 capsule by mouth 2 (two) times daily.   No current facility-administered medications on file prior to visit.     Chief Complaint  Patient presents with  . Follow-up    Pt c/o rash on torso, arms and neck x 1 day, raised red rash at times, others just red spotting, warm to the touch. Pt reports severe itching. NO change in daily routine or products other than new medication Repatha x 2 weeks ago. Does not feel to be affecting his breathing. No new complaints of breathing today. Possible sinus infection, some green mucus production.    Pulmonary tests A1AT 08/12/08 >> A1AT 263 PFT 09/23/08 >> FEV1 2.89 (84%), FEV1% 78, TLC 5.28 (78%), DLCO 64% PFT 08/23/11 >> FEV1 3.68(110%), FEV1% 75, TLC 5.38(80%), DLCO 60%, no BD Spirometry 02/05/13 >> FEV1 3.62 (97%), FEV1% 79 PFT 03/12/14 >> FEV1 3.68 (109%), FEV1% 81, TLC 5.40 (81%), DLCO 71%, no BD CT angio chest 12/19/16 >> advanced upper lobe predominant emphysema  Past medical history GERD, Depression, HLD, HTN, DM  Past surgical history, Family  history, Social history, Allergies reviewed  Vital signs BP 110/90 (BP Location: Left Arm, Cuff Size: Normal)   Pulse (!) 112   SpO2 98%   History of Present Illness: [name] is a 61 y.o. male former smoker with Emphysema and exertional/nocturnal hypoxemia.  Has hx of steroid responsive pneumonitis in 2009.  He developed an itchy rash on his chest and back since the past two days.  He says his neighbor was burning leaves, and then his rash started after that.  He is not having cough, wheeze, chest pain, sputum fever, swelling, or abdominal pain.  He continues to use 1.5 liters oxygen.  He had CT angio chest last week with [facility] >> showed mild aortic dilation and changes of emphysema.   Physical Exam:  General - pleasant Eyes - pupils reactive, wears glasses ENT - no sinus tenderness, no oral exudate, no LAN Cardiac - regular, no murmur Chest - no wheeze Back - no tenderness Abd - soft, non tender Ext - no edema Neuro - normal strength Skin - red, macular rash on chest and back Psych - normal mood   Assessment/Plan:  Rash likely from topical dermatitis after exposure to outdoor leaf burning exposure. - seems less likely related to repatha or recent CT with contrast - prednisone - benadryl, sarna  Emphysema. - prn combivent  Chronic respiratory failure with hypoxia - continue 1.5 liters oxygen with exertion and sleep   Patient Instructions  Prednisone 20 mg pill daily for 5 days  Benadryl 25 to 50 mg every 6 hours as needed for rash and itching  Sarna lotion as needed to rash  Follow up in 1 year    Coralyn Helling, MD [facility] Pager:  [phone] 12/26/2016, 3:35 PM

## 2016-12-27 ENCOUNTER — Telehealth: Payer: Self-pay | Admitting: Pharmacist

## 2016-12-27 DIAGNOSIS — E7801 Familial hypercholesterolemia: Secondary | ICD-10-CM

## 2016-12-27 NOTE — Telephone Encounter (Signed)
Received notification from [employer] that patient was approved for Repatha Safety Net patient assistance through 10/15/2017. Patient had not received a call to set up shipment, so we gave him the phone number to reach the [employer]. As patient had received Praluent from his PCP at the end of January, and had received Repatha samples from us, we scheduled follow up lipid/hepatic function labs next week on 01/03/17.

## 2017-01-03 ENCOUNTER — Other Ambulatory Visit: Payer: Medicare Other | Admitting: *Deleted

## 2017-01-03 DIAGNOSIS — I10 Essential (primary) hypertension: Secondary | ICD-10-CM

## 2017-01-03 DIAGNOSIS — E7801 Familial hypercholesterolemia: Secondary | ICD-10-CM

## 2017-01-03 DIAGNOSIS — E78019 Familial hypercholesterolemia, unspecified: Secondary | ICD-10-CM

## 2017-01-03 LAB — HEPATIC FUNCTION PANEL
ALT: 35 IU/L (ref 0–44)
AST: 20 IU/L (ref 0–40)
Albumin: 4.5 g/dL (ref 3.6–4.8)
Alkaline Phosphatase: 80 IU/L (ref 39–117)
Bilirubin Total: 0.4 mg/dL (ref 0.0–1.2)
Bilirubin, Direct: 0.14 mg/dL (ref 0.00–0.40)
Total Protein: 6.5 g/dL (ref 6.0–8.5)

## 2017-01-03 LAB — LIPID PANEL
CHOL/HDL RATIO: 2.6 ratio (ref 0.0–5.0)
CHOLESTEROL TOTAL: 118 mg/dL (ref 100–199)
HDL: 45 mg/dL (ref >39)
LDL Calculated: 35 mg/dL (ref 0–99)
Triglycerides: 192 mg/dL — ABNORMAL HIGH (ref 0–149)
VLDL CHOLESTEROL CAL: 38 mg/dL (ref 5–40)

## 2017-01-08 ENCOUNTER — Other Ambulatory Visit: Payer: Self-pay | Admitting: Internal Medicine

## 2017-01-08 DIAGNOSIS — E118 Type 2 diabetes mellitus with unspecified complications: Secondary | ICD-10-CM

## 2017-01-14 DIAGNOSIS — J188 Other pneumonia, unspecified organism: Secondary | ICD-10-CM | POA: Diagnosis not present

## 2017-02-06 ENCOUNTER — Ambulatory Visit (INDEPENDENT_AMBULATORY_CARE_PROVIDER_SITE_OTHER): Payer: Medicare Other | Admitting: Internal Medicine

## 2017-02-06 ENCOUNTER — Encounter: Payer: Self-pay | Admitting: Internal Medicine

## 2017-02-06 VITALS — BP 118/78 | HR 98 | Temp 98.4°F | Resp 16 | Ht 69.0 in | Wt 214.0 lb

## 2017-02-06 DIAGNOSIS — E118 Type 2 diabetes mellitus with unspecified complications: Secondary | ICD-10-CM

## 2017-02-06 DIAGNOSIS — I10 Essential (primary) hypertension: Secondary | ICD-10-CM | POA: Diagnosis not present

## 2017-02-06 NOTE — Progress Notes (Signed)
Pre visit review using our clinic review tool, if applicable. No additional management support is needed unless otherwise documented below in the visit note. 

## 2017-02-06 NOTE — Patient Instructions (Signed)

## 2017-02-06 NOTE — Progress Notes (Signed)
Subjective:  Patient ID: [name], male    DOB: October 09, 1956  Age: 61 y.o. MRN: [id number]  CC: Hypertension and Diabetes   HPI [name] presents for f/up - he offers no new complaints today.  Outpatient Medications Prior to Visit  Medication Sig Dispense Refill  . aspirin 81 MG EC tablet Take 81 mg by mouth daily.      . chlorthalidone (HYGROTON) 25 MG tablet TAKE 1 TABLET (25 MG TOTAL) BY MOUTH DAILY. 90 tablet 1  . Evolocumab (REPATHA) 140 MG/ML SOSY Inject 140 mg into the skin every 14 (fourteen) days.    . fexofenadine (ALLEGRA) 180 MG tablet Take 180 mg by mouth daily as needed for allergies.     Marland Kitchen glucose blood (ONETOUCH VERIO) test strip 1 each by Other route daily. And lancets 1/day 100 each 12  . Ipratropium-Albuterol (COMBIVENT RESPIMAT) 20-100 MCG/ACT AERS respimat Inhale 1 puff into the lungs every 6 (six) hours as needed for wheezing or shortness of breath. 3 Inhaler 1  . metFORMIN (GLUCOPHAGE) 1000 MG tablet TAKE 1 TABLET (1,000 MG TOTAL) BY MOUTH 2 (TWO) TIMES DAILY WITH A MEAL. 180 tablet 1  . NON FORMULARY Oxygen continuous 1.5 liters with activity     . Omega-3 Fatty Acids (FISH OIL PO) Take 1 capsule by mouth 2 (two) times daily.    . diphenhydrAMINE (BENADRYL) 25 MG tablet Take 1 tablet (25 mg total) by mouth every 6 (six) hours as needed. 30 tablet 0  . predniSONE (DELTASONE) 20 MG tablet Take 1 tablet (20 mg total) by mouth daily with breakfast. 5 tablet 0  . fluticasone (FLONASE) 50 MCG/ACT nasal spray Place 1 spray into both nostrils daily as needed for allergies or rhinitis.     Facility-Administered Medications Prior to Visit  Medication Dose Route Frequency Provider Last Rate Last Dose  . camphor-menthol (SARNA) lotion   Topical PRN Coralyn Helling, MD        ROS Review of Systems  Constitutional: Negative for appetite change and fatigue.  HENT: Negative.  Negative for sore throat and trouble swallowing.   Eyes: Negative.   Respiratory: Positive  for shortness of breath (chronic, stable, unchanged). Negative for cough, chest tightness and wheezing.   Cardiovascular: Negative for chest pain, palpitations and leg swelling.  Gastrointestinal: Negative for abdominal pain, constipation, diarrhea, nausea and vomiting.  Endocrine: Negative.  Negative for polydipsia, polyphagia and polyuria.  Genitourinary: Negative.   Musculoskeletal: Negative for back pain and neck pain.  Skin: Negative.   Allergic/Immunologic: Negative.   Neurological: Negative.  Negative for dizziness, weakness and headaches.  Hematological: Negative for adenopathy. Does not bruise/bleed easily.  Psychiatric/Behavioral: Negative.     Objective:  BP 118/78 (BP Location: Left Arm, Patient Position: Sitting, Cuff Size: Normal)   Pulse 98   Temp 98.4 F (36.9 C) (Oral)   Resp 16   Ht  (1.753 m)   Wt 214 lb (97.1 kg)   SpO2 98%   BMI 31.60 kg/m   BP Readings from Last 3 Encounters:  02/06/17 118/78  12/26/16 110/90  12/11/16 122/78    Wt Readings from Last 3 Encounters:  02/06/17 214 lb (97.1 kg)  12/11/16 220 lb (99.8 kg)  11/22/16 221 lb (100.2 kg)    Physical Exam  Constitutional: He is oriented to person, place, and time. No distress.  HENT:  Mouth/Throat: Oropharynx is clear and moist. No oropharyngeal exudate.  Eyes: Conjunctivae are normal. Right eye exhibits no discharge. Left  eye exhibits no discharge. No scleral icterus.  Neck: Normal range of motion. Neck supple. No JVD present. No tracheal deviation present. No thyromegaly present.  Cardiovascular: Normal rate, regular rhythm, normal heart sounds and intact distal pulses.  Exam reveals no gallop and no friction rub.   No murmur heard. Pulmonary/Chest: Effort normal and breath sounds normal. No stridor. No respiratory distress. He has no wheezes. He has no rales. He exhibits no tenderness.  Abdominal: Soft. Bowel sounds are normal. He exhibits no distension and no mass. There is no  tenderness. There is no rebound and no guarding.  Musculoskeletal: Normal range of motion. He exhibits no edema, tenderness or deformity.  Lymphadenopathy:    He has no cervical adenopathy.  Neurological: He is oriented to person, place, and time.  Skin: Skin is warm and dry. No rash noted. He is not diaphoretic. No erythema. No pallor.  Vitals reviewed.   Lab Results  Component Value Date   WBC 11.6 (H) 11/08/2016   HGB 15.4 11/08/2016   HCT 44.1 11/08/2016   PLT 264.0 11/08/2016   GLUCOSE 99 11/08/2016   CHOL 118 01/03/2017   TRIG 192 (H) 01/03/2017   HDL 45 01/03/2017   LDLDIRECT 174.0 11/08/2016   LDLCALC 35 01/03/2017   ALT 35 01/03/2017   AST 20 01/03/2017   NA 134 (L) 11/08/2016   K 3.7 11/08/2016   CL 98 11/08/2016   CREATININE 1.03 11/08/2016   BUN 14 11/08/2016   CO2 27 11/08/2016   TSH 1.55 11/08/2016   PSA 0.62 11/08/2016   INR 1.2 08/04/2008   HGBA1C 6.3 11/08/2016   MICROALBUR 0.9 11/08/2016    Ct Angio Abdomen W &/or Wo Contrast  Result Date: 12/19/2016 CLINICAL DATA:  Evaluate dilated aortic root on recent ECHO EXAM: CT ANGIOGRAPHY CHEST AND ABDOMEN TECHNIQUE: Multidetector CT imaging through the chest and abdomen was performed using the standard protocol during bolus administration of intravenous contrast. Multiplanar reconstructed images and MIPs were obtained and reviewed to evaluate the vascular anatomy. CONTRAST:  100 cc Isovue 370 IV COMPARISON:  None. FINDINGS: CTA CHEST FINDINGS Cardiovascular: Aortic diameter at the annulus measures 2.5 cm. Diameter at the sinus of Valsalva measures 4.0 cm. Diameter at the sino-tubular junction 3.0 cm. Diameter in the ascending aorta measures 3.8 cm. Descending thoracic aortic diameter maximally 2.7 cm. Heart is normal size. No visible coronary artery calcifications. Mediastinum/Nodes: No mediastinal, hilar, or axillary adenopathy. Lungs/Pleura: Moderate to advanced emphysema. Areas of scarring peripherally in the upper  and mid lungs. No confluent opacities or effusions. Musculoskeletal: Chest wall soft tissues are unremarkable. No acute bony abnormality. Review of the MIP images confirms the above findings. CTA ABDOMEN FINDINGS VASCULAR Aorta: Non aneurysmal. Maximum aortic diameter 2.5 cm distally with mild calcified and now noncalcified plaque. No dissection. Celiac: Widely patent. SMA: Widely patent Renals: 3 right renal artery is hands a single left renal artery. No focal stenosis. IMA: Patent Veins: Grossly unremarkable. Review of the MIP images confirms the above findings. NON-VASCULAR Hepatobiliary: No focal hepatic abnormality. Gallbladder unremarkable. Pancreas: No focal abnormality or ductal dilatation. Spleen: No focal abnormality.  Normal size. Adrenals/Urinary Tract: Scarring in the mid to lower pole of the right kidney. No hydronephrosis or renal mass. Adrenal glands unremarkable. Stomach/Bowel: Descending colonic diverticulosis. No active diverticulitis. Stomach and small bowel decompressed. Lymphatic: No adenopathy. Other: No free fluid or free air. Musculoskeletal: No acute bony abnormality. Review of the MIP images confirms the above findings. No acute findings in the chest  or abdomen. IMPRESSION: Aortic root borderline in diameter at 4 cm at the sinuses of Valsalva. Remainder of the aorta is normal caliber. Recommend annual imaging followup by CTA or MRA. This recommendation follows 2010 ACCF/AHA/AATS/ACR/ASA/SCA/SCAI/SIR/STS/SVM Guidelines for the Diagnosis and Management of Patients with Thoracic Aortic Disease. Circulation. 2010; 121: Z610-R604 No acute findings in the chest or abdomen. Emphysema. Left colonic diverticulosis. Electronically Signed   By: Charlett Nose M.D.   On: 12/19/2016 10:49   Ct Angio Chest Aorta W &/or Wo Contrast  Result Date: 12/19/2016 CLINICAL DATA:  Evaluate dilated aortic root on recent ECHO EXAM: CT ANGIOGRAPHY CHEST AND ABDOMEN TECHNIQUE: Multidetector CT imaging through the  chest and abdomen was performed using the standard protocol during bolus administration of intravenous contrast. Multiplanar reconstructed images and MIPs were obtained and reviewed to evaluate the vascular anatomy. CONTRAST:  100 cc Isovue 370 IV COMPARISON:  None. FINDINGS: CTA CHEST FINDINGS Cardiovascular: Aortic diameter at the annulus measures 2.5 cm. Diameter at the sinus of Valsalva measures 4.0 cm. Diameter at the sino-tubular junction 3.0 cm. Diameter in the ascending aorta measures 3.8 cm. Descending thoracic aortic diameter maximally 2.7 cm. Heart is normal size. No visible coronary artery calcifications. Mediastinum/Nodes: No mediastinal, hilar, or axillary adenopathy. Lungs/Pleura: Moderate to advanced emphysema. Areas of scarring peripherally in the upper and mid lungs. No confluent opacities or effusions. Musculoskeletal: Chest wall soft tissues are unremarkable. No acute bony abnormality. Review of the MIP images confirms the above findings. CTA ABDOMEN FINDINGS VASCULAR Aorta: Non aneurysmal. Maximum aortic diameter 2.5 cm distally with mild calcified and now noncalcified plaque. No dissection. Celiac: Widely patent. SMA: Widely patent Renals: 3 right renal artery is hands a single left renal artery. No focal stenosis. IMA: Patent Veins: Grossly unremarkable. Review of the MIP images confirms the above findings. NON-VASCULAR Hepatobiliary: No focal hepatic abnormality. Gallbladder unremarkable. Pancreas: No focal abnormality or ductal dilatation. Spleen: No focal abnormality.  Normal size. Adrenals/Urinary Tract: Scarring in the mid to lower pole of the right kidney. No hydronephrosis or renal mass. Adrenal glands unremarkable. Stomach/Bowel: Descending colonic diverticulosis. No active diverticulitis. Stomach and small bowel decompressed. Lymphatic: No adenopathy. Other: No free fluid or free air. Musculoskeletal: No acute bony abnormality. Review of the MIP images confirms the above findings. No  acute findings in the chest or abdomen. IMPRESSION: Aortic root borderline in diameter at 4 cm at the sinuses of Valsalva. Remainder of the aorta is normal caliber. Recommend annual imaging followup by CTA or MRA. This recommendation follows 2010 ACCF/AHA/AATS/ACR/ASA/SCA/SCAI/SIR/STS/SVM Guidelines for the Diagnosis and Management of Patients with Thoracic Aortic Disease. Circulation. 2010; 121: V409-W119 No acute findings in the chest or abdomen. Emphysema. Left colonic diverticulosis. Electronically Signed   By: Charlett Nose M.D.   On: 12/19/2016 10:49    Assessment & Plan:   [name] was seen today for hypertension and diabetes.  Diagnoses and all orders for this visit:  Type 2 diabetes mellitus with complication, without long-term current use of insulin (HCC)- his recent A1C was 6.3%, his blood sugars are well controlled  Essential hypertension, benign- his BP is well controlled, recent lytes and renal fxn were normal   I have discontinued Mr. Shiffler predniSONE and diphenhydrAMINE. I am also having him maintain his aspirin, NON FORMULARY, fexofenadine, Ipratropium-Albuterol, glucose blood, Omega-3 Fatty Acids (FISH OIL PO), chlorthalidone, fluticasone, Evolocumab, and metFORMIN. We will continue to administer camphor-menthol.  No orders of the defined types were placed in this encounter.    Follow-up: Return in about  4 months (around 06/08/2017).  Sanda Linger, MD

## 2017-02-13 DIAGNOSIS — J188 Other pneumonia, unspecified organism: Secondary | ICD-10-CM | POA: Diagnosis not present

## 2017-03-16 DIAGNOSIS — J188 Other pneumonia, unspecified organism: Secondary | ICD-10-CM | POA: Diagnosis not present

## 2017-04-07 ENCOUNTER — Other Ambulatory Visit: Payer: Self-pay | Admitting: Endocrinology

## 2017-04-15 DIAGNOSIS — J188 Other pneumonia, unspecified organism: Secondary | ICD-10-CM | POA: Diagnosis not present

## 2017-04-26 ENCOUNTER — Telehealth: Payer: Self-pay

## 2017-04-26 DIAGNOSIS — I10 Essential (primary) hypertension: Secondary | ICD-10-CM

## 2017-04-26 MED ORDER — CHLORTHALIDONE 25 MG PO TABS: 25.0000 mg | ORAL_TABLET | Freq: Every day | ORAL | 1 refills | Status: DC

## 2017-04-26 NOTE — Telephone Encounter (Signed)
[facility] sent rx request for chlorthalidone.   LOV 02/10/2017 and next appt on 05/2017.   90 day sent to pharmacy.

## 2017-05-16 DIAGNOSIS — J188 Other pneumonia, unspecified organism: Secondary | ICD-10-CM | POA: Diagnosis not present

## 2017-05-28 ENCOUNTER — Ambulatory Visit (INDEPENDENT_AMBULATORY_CARE_PROVIDER_SITE_OTHER): Payer: Medicare Other | Admitting: Endocrinology

## 2017-05-28 ENCOUNTER — Encounter: Payer: Self-pay | Admitting: Endocrinology

## 2017-05-28 VITALS — BP 142/84 | HR 106 | Ht 69.0 in | Wt 215.0 lb

## 2017-05-28 DIAGNOSIS — E118 Type 2 diabetes mellitus with unspecified complications: Secondary | ICD-10-CM

## 2017-05-28 LAB — POCT GLYCOSYLATED HEMOGLOBIN (HGB A1C): HEMOGLOBIN A1C: 6

## 2017-05-28 NOTE — Progress Notes (Signed)
Subjective:    Patient ID: [name], male    DOB: Mar 07, 1956, 61 y.o.   MRN: [id number]  HPI Pt returns for f/u of diabetes mellitus: DM type: 2 Dx'ed: 7681 Complications: polyneuropathy Therapy: metformin.  DKA: never.  Severe hypoglycemia: never.  Pancreatitis: never.  Other: he has never been on insulin (except while in the hospital on steroids).   Interval history: no recent steroids.  No new sxs.   Past Medical History:  Diagnosis Date  . COPD (chronic obstructive pulmonary disease) ([place]) 09/23/08   -PFT 09/23/08 FEV1 2.89(84%), FVC 3.70 (78%), TLC 5.28 (78%), DLCO 64% mixed obstructive and  restrictive pattern.  Spirometry 03/11/09 FEV 3.35 (97%),FVC 4.10 (86%), FEV% 82. Steroid responsive pneumonitis 04/09- required mechanical ventilation during hospitalization - The following hospital test were negative: PCP,ANA,HIV,BAL. - ACE25,RF20,CRP 28,A1AT 263, ESR 109  . Depression   . Diabetes mellitus without complication ([place])   . Dyslipidemia   . Dyslipidemia   . GERD (gastroesophageal reflux disease)   . History of skin cancer   . Hypertension    Echo 10/09 EF 60%  . ILD (interstitial lung disease) ([place])   . Panic attack     Past Surgical History:  Procedure Laterality Date  . BRONCHOSCOPY  10/09  . Ganglion cyst on foot    . HERNIA REPAIR     2 hernia  . Left shoulder surgery      Social History   Social History  . Marital status: Divorced    Spouse name: N/A  . Number of children: N/A  . Years of education: N/A   Occupational History  . Theme park manager Unemployed   Social History Main Topics  . Smoking status: Former Smoker    Packs/day: 1.00    Years: 16.00    Types: Cigarettes    Quit date: 10/17/2003  . Smokeless tobacco: Never Used  . Alcohol use No     Comment: Rare beer 3-4 times per year  . Drug use: No  . Sexual activity: Not Currently   Other Topics Concern  . Not on file   Social History Narrative   Divorced. No Children   Drug  use-No   Regular exercise-yes   Lives alone    Current Outpatient Prescriptions on File Prior to Visit  Medication Sig Dispense Refill  . aspirin 81 MG EC tablet Take 81 mg by mouth daily.      . chlorthalidone (HYGROTON) 25 MG tablet Take 1 tablet (25 mg total) by mouth daily. 90 tablet 1  . Evolocumab (REPATHA) 140 MG/ML SOSY Inject 140 mg into the skin every 14 (fourteen) days.    . fexofenadine (ALLEGRA) 180 MG tablet Take 180 mg by mouth daily as needed for allergies.     . fluticasone (FLONASE) 50 MCG/ACT nasal spray Place 1 spray into both nostrils daily as needed for allergies or rhinitis.    . Ipratropium-Albuterol (COMBIVENT RESPIMAT) 20-100 MCG/ACT AERS respimat Inhale 1 puff into the lungs every 6 (six) hours as needed for wheezing or shortness of breath. 3 Inhaler 1  . metFORMIN (GLUCOPHAGE) 1000 MG tablet TAKE 1 TABLET (1,000 MG TOTAL) BY MOUTH 2 (TWO) TIMES DAILY WITH A MEAL. 180 tablet 1  . NON FORMULARY Oxygen continuous 1.5 liters with activity     . Omega-3 Fatty Acids (FISH OIL PO) Take 1 capsule by mouth 2 (two) times daily.    Glory Rosebush VERIO test strip USE 1 PER DAY 100 each 2   Current  Facility-Administered Medications on File Prior to Visit  Medication Dose Route Frequency Provider Last Rate Last Dose  . camphor-menthol (SARNA) lotion   Topical PRN Chesley Mires, MD        Allergies  Allergen Reactions  . Latex   . Morphine   . Statins     REACTION: weakness, muscle pains    Family History  Problem Relation Age of Onset  . Heart attack Father 11  . Diabetes Father   . Heart attack Brother   . Emphysema Brother   . Diabetes Brother     BP (!) 142/84   Pulse (!) 106   Ht _0  (1.753 m)   Wt 215 lb (97.5 kg)   SpO2 96% Comment: On 1.5 L of O2  BMI 31.75 kg/m    Review of Systems No weight change.     Objective:   Physical Exam VITAL SIGNS:  See vs page GENERAL: no distress.  Has 02 on.   Pulses: foot pulses are intact bilaterally.   MSK:  no deformity of the feet or ankles.  CV: no edema of the legs or ankles Skin:  no ulcer on the feet or ankles.  normal color and temp on the feet and ankles Neuro: sensation is intact to touch on the feet and ankles.    Lab Results  Component Value Date   HGBA1C 6.0 05/28/2017      Assessment & Plan:  Type 2 DM: well-controlled. HTN: with likely situational component.  Please continue the same medication.  Patient Instructions  check your blood sugar once a day.  vary the time of day when you check, between before the 3 meals, and at bedtime.  also check if you have symptoms of your blood sugar being too high or too low.  please keep a record of the readings and bring it to your next appointment here (or you can bring the meter itself).  You can write it on any piece of paper.  please call us sooner if your blood sugar goes below 70, or if you have a lot of readings over 200.   Please continue the same metformin.  I would be happy to see you back here as needed.

## 2017-05-28 NOTE — Patient Instructions (Addendum)
check your blood sugar once a day.  vary the time of day when you check, between before the 3 meals, and at bedtime.  also check if you have symptoms of your blood sugar being too high or too low.  please keep a record of the readings and bring it to your next appointment here (or you can bring the meter itself).  You can write it on any piece of paper.  please call us sooner if your blood sugar goes below 70, or if you have a lot of readings over 200.   Please continue the same metformin.  I would be happy to see you back here as needed.

## 2017-06-11 ENCOUNTER — Ambulatory Visit (INDEPENDENT_AMBULATORY_CARE_PROVIDER_SITE_OTHER): Payer: Medicare Other | Admitting: Internal Medicine

## 2017-06-11 ENCOUNTER — Encounter: Payer: Self-pay | Admitting: Internal Medicine

## 2017-06-11 ENCOUNTER — Other Ambulatory Visit (INDEPENDENT_AMBULATORY_CARE_PROVIDER_SITE_OTHER): Payer: Medicare Other

## 2017-06-11 VITALS — BP 136/88 | HR 101 | Temp 97.9°F | Resp 16 | Ht 69.0 in | Wt 217.0 lb

## 2017-06-11 DIAGNOSIS — Z23 Encounter for immunization: Secondary | ICD-10-CM | POA: Diagnosis not present

## 2017-06-11 DIAGNOSIS — I10 Essential (primary) hypertension: Secondary | ICD-10-CM

## 2017-06-11 DIAGNOSIS — E118 Type 2 diabetes mellitus with unspecified complications: Secondary | ICD-10-CM

## 2017-06-11 DIAGNOSIS — E876 Hypokalemia: Secondary | ICD-10-CM | POA: Insufficient documentation

## 2017-06-11 LAB — BASIC METABOLIC PANEL
BUN: 15 mg/dL (ref 6–23)
CHLORIDE: 101 meq/L (ref 96–112)
CO2: 28 mEq/L (ref 19–32)
Calcium: 10.1 mg/dL (ref 8.4–10.5)
Creatinine, Ser: 0.98 mg/dL (ref 0.40–1.50)
GFR: 81.98 mL/min (ref >60.00)
Glucose, Bld: 161 mg/dL — ABNORMAL HIGH (ref 70–99)
POTASSIUM: 3.4 meq/L — AB (ref 3.5–5.1)
SODIUM: 137 meq/L (ref 135–145)

## 2017-06-11 MED ORDER — METFORMIN HCL 1000 MG PO TABS: 500.0000 mg | ORAL_TABLET | Freq: Two times a day (BID) | ORAL | 1 refills | Status: DC

## 2017-06-11 MED ORDER — POTASSIUM CHLORIDE CRYS ER 20 MEQ PO TBCR: 20.0000 meq | EXTENDED_RELEASE_TABLET | Freq: Two times a day (BID) | ORAL | 1 refills | Status: DC

## 2017-06-11 NOTE — Progress Notes (Signed)
Subjective:  Patient ID: [name], male    DOB: 01/11/1956  Age: 61 y.o. MRN: [id number]  CC: Hypertension and Diabetes   HPI TAVION SENKBEIL presents for f/up - he has a baseline level of SOB but no recent cough, phlegm, or CP. He has lost weight with lifestyle modifications. He offers no other complaints today.  Outpatient Medications Prior to Visit  Medication Sig Dispense Refill  . aspirin 81 MG EC tablet Take 81 mg by mouth daily.      . chlorthalidone (HYGROTON) 25 MG tablet Take 1 tablet (25 mg total) by mouth daily. 90 tablet 1  . Evolocumab (REPATHA) 140 MG/ML SOSY Inject 140 mg into the skin every 14 (fourteen) days.    . fexofenadine (ALLEGRA) 180 MG tablet Take 180 mg by mouth daily as needed for allergies.     . Ipratropium-Albuterol (COMBIVENT RESPIMAT) 20-100 MCG/ACT AERS respimat Inhale 1 puff into the lungs every 6 (six) hours as needed for wheezing or shortness of breath. 3 Inhaler 1  . NON FORMULARY Oxygen continuous 1.5 liters with activity     . Omega-3 Fatty Acids (FISH OIL PO) Take 1 capsule by mouth 2 (two) times daily.    Letta Pate VERIO test strip USE 1 PER DAY 100 each 2  . fluticasone (FLONASE) 50 MCG/ACT nasal spray Place 1 spray into both nostrils daily as needed for allergies or rhinitis.    . metFORMIN (GLUCOPHAGE) 1000 MG tablet TAKE 1 TABLET (1,000 MG TOTAL) BY MOUTH 2 (TWO) TIMES DAILY WITH A MEAL. 180 tablet 1  . camphor-menthol (SARNA) lotion      No facility-administered medications prior to visit.     ROS Review of Systems  Constitutional: Negative.  Negative for chills, diaphoresis, fatigue and fever.  HENT: Negative.  Negative for sore throat, trouble swallowing and voice change.   Eyes: Negative.  Negative for visual disturbance.  Respiratory: Positive for shortness of breath. Negative for cough, choking, chest tightness, wheezing and stridor.   Cardiovascular: Negative.  Negative for chest pain, palpitations and leg swelling.    Gastrointestinal: Negative for abdominal pain, constipation, diarrhea, nausea and vomiting.  Endocrine: Negative.  Negative for polydipsia, polyphagia and polyuria.  Genitourinary: Negative.   Musculoskeletal: Negative.  Negative for back pain and myalgias.  Skin: Negative.  Negative for color change and rash.  Allergic/Immunologic: Negative.   Neurological: Negative.  Negative for dizziness, weakness and numbness.  Hematological: Negative for adenopathy. Does not bruise/bleed easily.  Psychiatric/Behavioral: Negative.     Objective:  BP 136/88 (BP Location: Left Arm, Patient Position: Sitting, Cuff Size: Normal)   Pulse (!) 101   Temp 97.9 F (36.6 C) (Oral)   Resp 16   Ht 5\' 9"  (1.753 m)   Wt 217 lb (98.4 kg)   SpO2 99%   BMI 32.05 kg/m   BP Readings from Last 3 Encounters:  06/11/17 136/88  05/28/17 (!) 142/84  02/06/17 118/78    Wt Readings from Last 3 Encounters:  06/11/17 217 lb (98.4 kg)  05/28/17 215 lb (97.5 kg)  02/06/17 214 lb (97.1 kg)    Physical Exam  Constitutional: He is oriented to person, place, and time. No distress.  HENT:  Mouth/Throat: Oropharynx is clear and moist. No oropharyngeal exudate.  Eyes: Conjunctivae are normal. Right eye exhibits no discharge. Left eye exhibits no discharge. No scleral icterus.  Neck: Normal range of motion. Neck supple. No JVD present. No thyromegaly present.  Cardiovascular: Normal rate, regular rhythm  and intact distal pulses.  Exam reveals no gallop and no friction rub.   No murmur heard. Pulmonary/Chest: Effort normal and breath sounds normal. No stridor. No respiratory distress. He has no wheezes. He has no rales. He exhibits no tenderness.  Abdominal: Soft. Bowel sounds are normal. He exhibits no distension and no mass. There is no tenderness. There is no rebound and no guarding.  Musculoskeletal: Normal range of motion. He exhibits no edema, tenderness or deformity.  Lymphadenopathy:    He has no cervical  adenopathy.  Neurological: He is alert and oriented to person, place, and time.  Skin: Skin is warm and dry. No rash noted. He is not diaphoretic. No erythema. No pallor.  Vitals reviewed.   Lab Results  Component Value Date   WBC 11.6 (H) 11/08/2016   HGB 15.4 11/08/2016   HCT 44.1 11/08/2016   PLT 264.0 11/08/2016   GLUCOSE 161 (H) 06/11/2017   CHOL 118 01/03/2017   TRIG 192 (H) 01/03/2017   HDL 45 01/03/2017   LDLDIRECT 174.0 11/08/2016   LDLCALC 35 01/03/2017   ALT 35 01/03/2017   AST 20 01/03/2017   NA 137 06/11/2017   K 3.4 (L) 06/11/2017   CL 101 06/11/2017   CREATININE 0.98 06/11/2017   BUN 15 06/11/2017   CO2 28 06/11/2017   TSH 1.55 11/08/2016   PSA 0.62 11/08/2016   INR 1.2 08/04/2008   HGBA1C 6.0 05/28/2017   MICROALBUR 0.9 11/08/2016    Ct Angio Abdomen W &/or Wo Contrast  Result Date: 12/19/2016 CLINICAL DATA:  Evaluate dilated aortic root on recent ECHO EXAM: CT ANGIOGRAPHY CHEST AND ABDOMEN TECHNIQUE: Multidetector CT imaging through the chest and abdomen was performed using the standard protocol during bolus administration of intravenous contrast. Multiplanar reconstructed images and MIPs were obtained and reviewed to evaluate the vascular anatomy. CONTRAST:  100 cc Isovue 370 IV COMPARISON:  None. FINDINGS: CTA CHEST FINDINGS Cardiovascular: Aortic diameter at the annulus measures 2.5 cm. Diameter at the sinus of Valsalva measures 4.0 cm. Diameter at the sino-tubular junction 3.0 cm. Diameter in the ascending aorta measures 3.8 cm. Descending thoracic aortic diameter maximally 2.7 cm. Heart is normal size. No visible coronary artery calcifications. Mediastinum/Nodes: No mediastinal, hilar, or axillary adenopathy. Lungs/Pleura: Moderate to advanced emphysema. Areas of scarring peripherally in the upper and mid lungs. No confluent opacities or effusions. Musculoskeletal: Chest wall soft tissues are unremarkable. No acute bony abnormality. Review of the MIP images  confirms the above findings. CTA ABDOMEN FINDINGS VASCULAR Aorta: Non aneurysmal. Maximum aortic diameter 2.5 cm distally with mild calcified and now noncalcified plaque. No dissection. Celiac: Widely patent. SMA: Widely patent Renals: 3 right renal artery is hands a single left renal artery. No focal stenosis. IMA: Patent Veins: Grossly unremarkable. Review of the MIP images confirms the above findings. NON-VASCULAR Hepatobiliary: No focal hepatic abnormality. Gallbladder unremarkable. Pancreas: No focal abnormality or ductal dilatation. Spleen: No focal abnormality.  Normal size. Adrenals/Urinary Tract: Scarring in the mid to lower pole of the right kidney. No hydronephrosis or renal mass. Adrenal glands unremarkable. Stomach/Bowel: Descending colonic diverticulosis. No active diverticulitis. Stomach and small bowel decompressed. Lymphatic: No adenopathy. Other: No free fluid or free air. Musculoskeletal: No acute bony abnormality. Review of the MIP images confirms the above findings. No acute findings in the chest or abdomen. IMPRESSION: Aortic root borderline in diameter at 4 cm at the sinuses of Valsalva. Remainder of the aorta is normal caliber. Recommend annual imaging followup by CTA or MRA.  This recommendation follows 2010 ACCF/AHA/AATS/ACR/ASA/SCA/SCAI/SIR/STS/SVM Guidelines for the Diagnosis and Management of Patients with Thoracic Aortic Disease. Circulation. 2010; 121: Z610-R604 No acute findings in the chest or abdomen. Emphysema. Left colonic diverticulosis. Electronically Signed   By: Charlett Nose M.D.   On: 12/19/2016 10:49   Ct Angio Chest Aorta W &/or Wo Contrast  Result Date: 12/19/2016 CLINICAL DATA:  Evaluate dilated aortic root on recent ECHO EXAM: CT ANGIOGRAPHY CHEST AND ABDOMEN TECHNIQUE: Multidetector CT imaging through the chest and abdomen was performed using the standard protocol during bolus administration of intravenous contrast. Multiplanar reconstructed images and MIPs were  obtained and reviewed to evaluate the vascular anatomy. CONTRAST:  100 cc Isovue 370 IV COMPARISON:  None. FINDINGS: CTA CHEST FINDINGS Cardiovascular: Aortic diameter at the annulus measures 2.5 cm. Diameter at the sinus of Valsalva measures 4.0 cm. Diameter at the sino-tubular junction 3.0 cm. Diameter in the ascending aorta measures 3.8 cm. Descending thoracic aortic diameter maximally 2.7 cm. Heart is normal size. No visible coronary artery calcifications. Mediastinum/Nodes: No mediastinal, hilar, or axillary adenopathy. Lungs/Pleura: Moderate to advanced emphysema. Areas of scarring peripherally in the upper and mid lungs. No confluent opacities or effusions. Musculoskeletal: Chest wall soft tissues are unremarkable. No acute bony abnormality. Review of the MIP images confirms the above findings. CTA ABDOMEN FINDINGS VASCULAR Aorta: Non aneurysmal. Maximum aortic diameter 2.5 cm distally with mild calcified and now noncalcified plaque. No dissection. Celiac: Widely patent. SMA: Widely patent Renals: 3 right renal artery is hands a single left renal artery. No focal stenosis. IMA: Patent Veins: Grossly unremarkable. Review of the MIP images confirms the above findings. NON-VASCULAR Hepatobiliary: No focal hepatic abnormality. Gallbladder unremarkable. Pancreas: No focal abnormality or ductal dilatation. Spleen: No focal abnormality.  Normal size. Adrenals/Urinary Tract: Scarring in the mid to lower pole of the right kidney. No hydronephrosis or renal mass. Adrenal glands unremarkable. Stomach/Bowel: Descending colonic diverticulosis. No active diverticulitis. Stomach and small bowel decompressed. Lymphatic: No adenopathy. Other: No free fluid or free air. Musculoskeletal: No acute bony abnormality. Review of the MIP images confirms the above findings. No acute findings in the chest or abdomen. IMPRESSION: Aortic root borderline in diameter at 4 cm at the sinuses of Valsalva. Remainder of the aorta is normal  caliber. Recommend annual imaging followup by CTA or MRA. This recommendation follows 2010 ACCF/AHA/AATS/ACR/ASA/SCA/SCAI/SIR/STS/SVM Guidelines for the Diagnosis and Management of Patients with Thoracic Aortic Disease. Circulation. 2010; 121: V409-W119 No acute findings in the chest or abdomen. Emphysema. Left colonic diverticulosis. Electronically Signed   By: Charlett Nose M.D.   On: 12/19/2016 10:49    Assessment & Plan:   Tahjai was seen today for hypertension and diabetes.  Diagnoses and all orders for this visit:  Essential hypertension, benign- His blood pressure is adequately well controlled. He has developed hypokalemia so will start potassium replacement therapy. -     Basic metabolic panel; Future -     potassium chloride SA (K-DUR,KLOR-CON) 20 MEQ tablet; Take 1 tablet (20 mEq total) by mouth 2 (two) times daily.  Type 2 diabetes mellitus with complication, without long-term current use of insulin (HCC)- his recent A1c was down to 6% so will decrease his metformin dose by 50%. -     metFORMIN (GLUCOPHAGE) 1000 MG tablet; Take 0.5 tablets (500 mg total) by mouth 2 (two) times daily with a meal. -     Basic metabolic panel; Future  Need for influenza vaccination -     Flu Vaccine QUAD 6+ mos  PF IM (Fluarix Quad PF)  Hypokalemia -     potassium chloride SA (K-DUR,KLOR-CON) 20 MEQ tablet; Take 1 tablet (20 mEq total) by mouth 2 (two) times daily.   I have discontinued Mr. Uhrich fluticasone. I have also changed his metFORMIN. Additionally, I am having him start on potassium chloride SA. Lastly, I am having him maintain his aspirin, NON FORMULARY, fexofenadine, Ipratropium-Albuterol, Omega-3 Fatty Acids (FISH OIL PO), Evolocumab, ONETOUCH VERIO, and chlorthalidone. We will stop administering camphor-menthol.  Meds ordered this encounter  Medications  . metFORMIN (GLUCOPHAGE) 1000 MG tablet    Sig: Take 0.5 tablets (500 mg total) by mouth 2 (two) times daily with a meal.     Dispense:  180 tablet    Refill:  1  . potassium chloride SA (K-DUR,KLOR-CON) 20 MEQ tablet    Sig: Take 1 tablet (20 mEq total) by mouth 2 (two) times daily.    Dispense:  180 tablet    Refill:  1     Follow-up: Return in about 6 months (around 12/12/2017).  Sanda Linger, MD

## 2017-06-11 NOTE — Patient Instructions (Signed)

## 2017-06-16 DIAGNOSIS — J188 Other pneumonia, unspecified organism: Secondary | ICD-10-CM | POA: Diagnosis not present

## 2017-06-20 DIAGNOSIS — E119 Type 2 diabetes mellitus without complications: Secondary | ICD-10-CM | POA: Diagnosis not present

## 2017-07-02 ENCOUNTER — Other Ambulatory Visit: Payer: Self-pay | Admitting: Internal Medicine

## 2017-07-02 DIAGNOSIS — E118 Type 2 diabetes mellitus with unspecified complications: Secondary | ICD-10-CM

## 2017-07-16 DIAGNOSIS — J188 Other pneumonia, unspecified organism: Secondary | ICD-10-CM | POA: Diagnosis not present

## 2017-08-15 IMAGING — CT CT ANGIO CHEST
2 of 7 series · 18 of 36 positions shown · IV contrast (isovue)
Comparison: None.

CLINICAL DATA: Evaluate dilated aortic root on recent ECHO

EXAM:
CT ANGIOGRAPHY CHEST AND ABDOMEN
TECHNIQUE: Multidetector CT imaging through the chest and abdomen was performed
using the standard protocol during bolus administration of
intravenous contrast. Multiplanar reconstructed images and MIPs were
obtained and reviewed to evaluate the vascular anatomy.
CONTRAST:  100 cc Isovue 370 IV

[Series 4: aorta 3.0 i31f 2 · axial · 0.87mm/px · z∈[-459,-48]mm · 17 of 151 slices shown]
[im 7/151  lung]
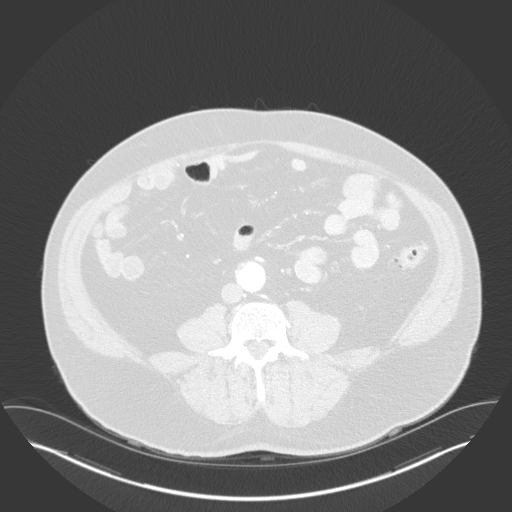
[im 19/151  mediastinal]
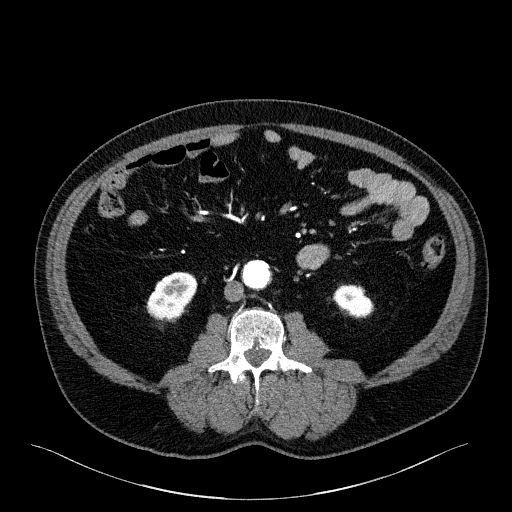
[im 26/151  lung]
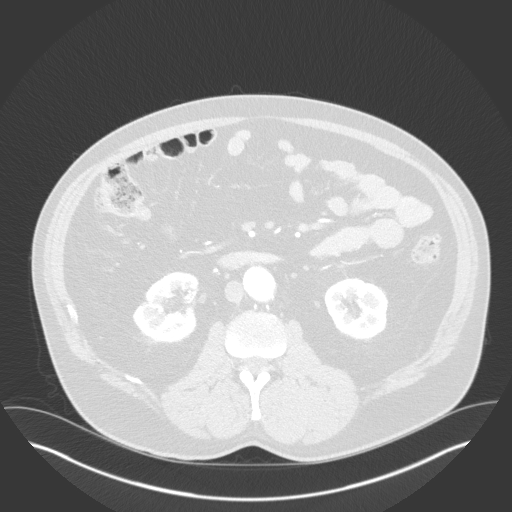
[im 32/151  mediastinal]
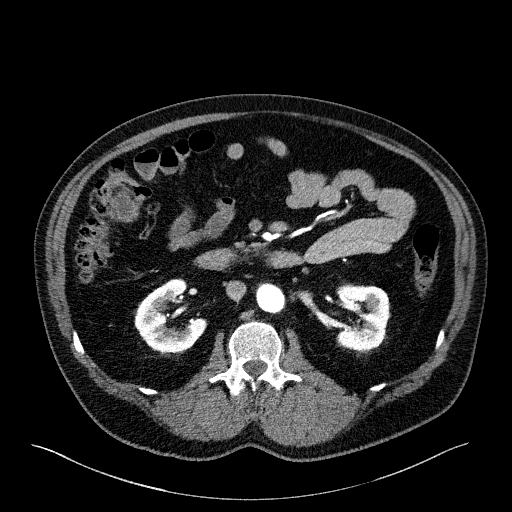
[im 44/151  lung]
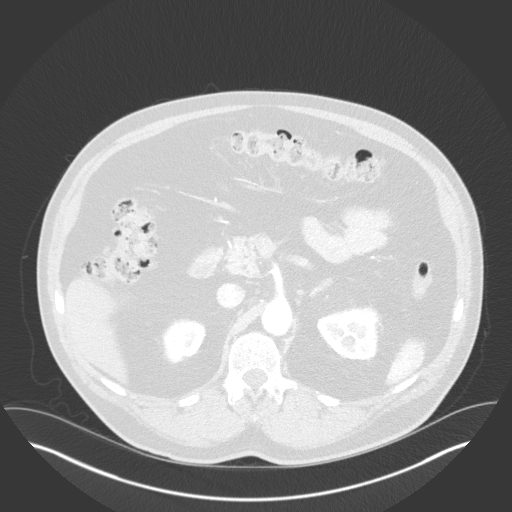
[im 51/151  mediastinal]
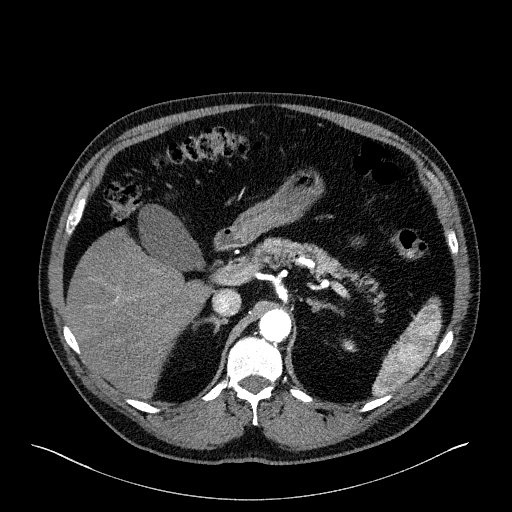
[im 57/151  lung]
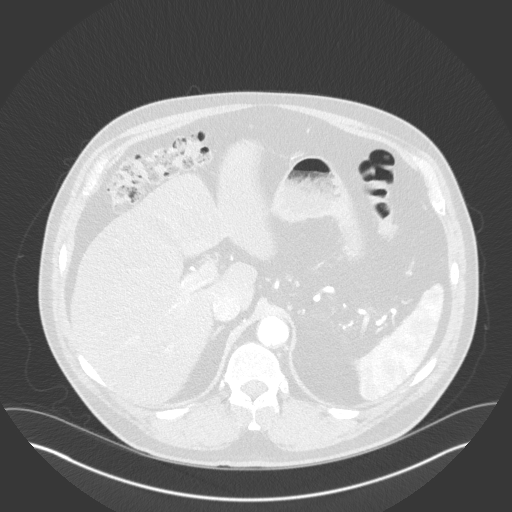
[im 69/151  mediastinal]
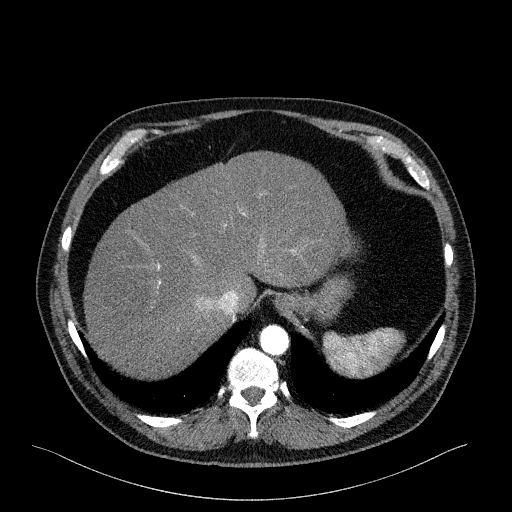
[im 76/151  lung]
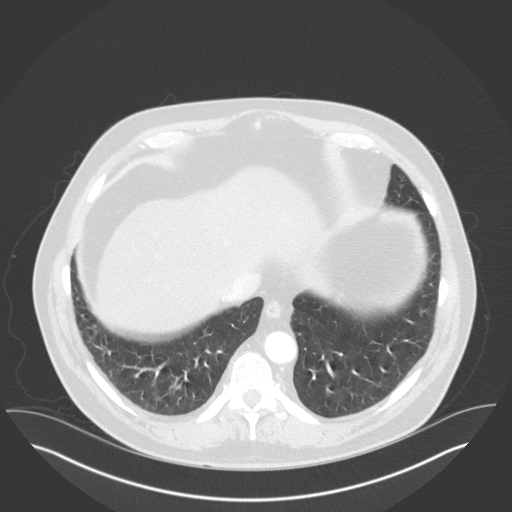
[im 82/151  mediastinal]
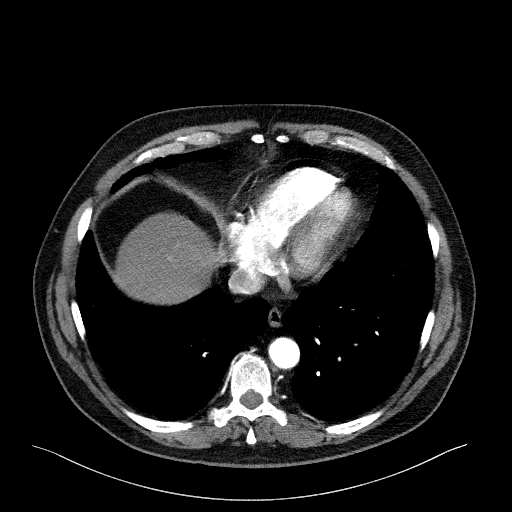
[im 94/151  lung]
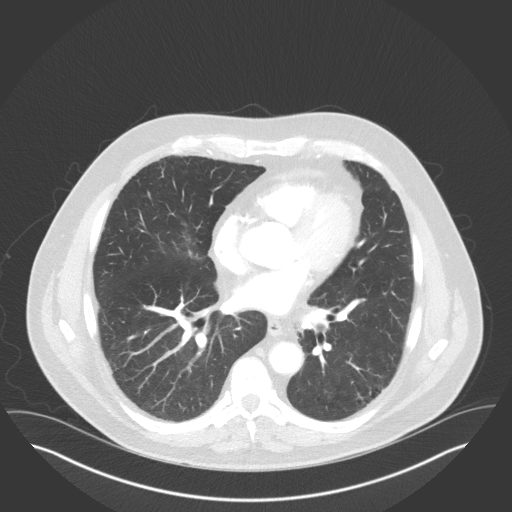
[im 101/151  mediastinal]
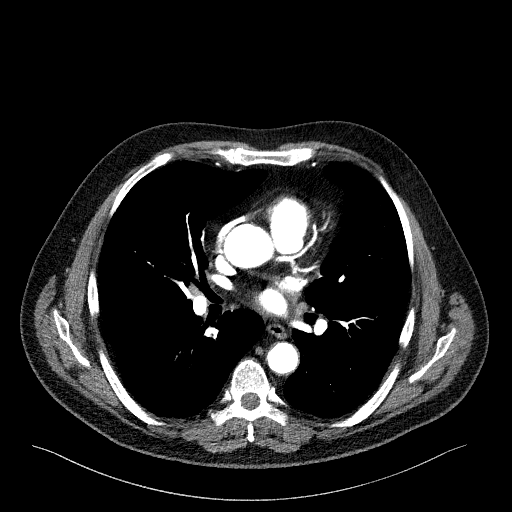
[im 107/151  lung]
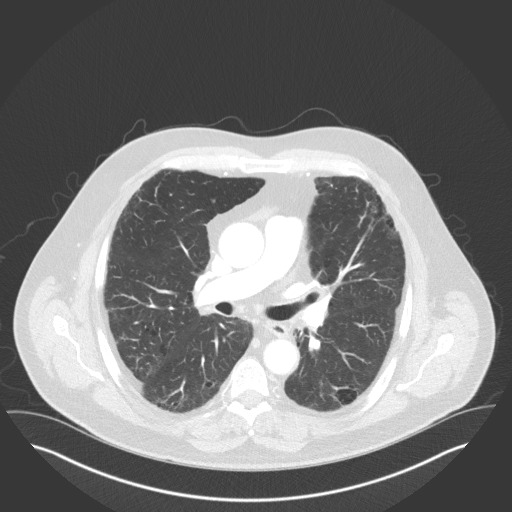
[im 119/151  mediastinal]
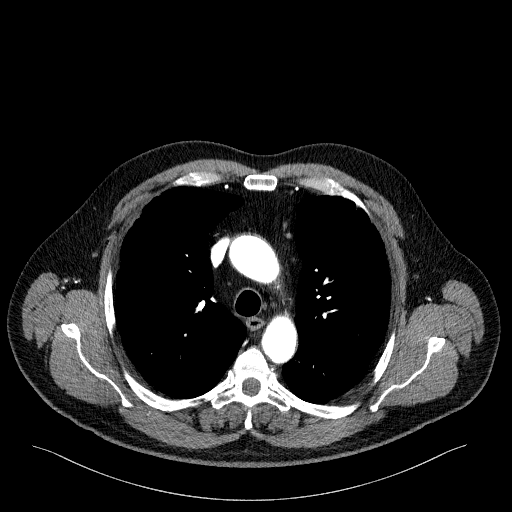
[im 126/151  lung]
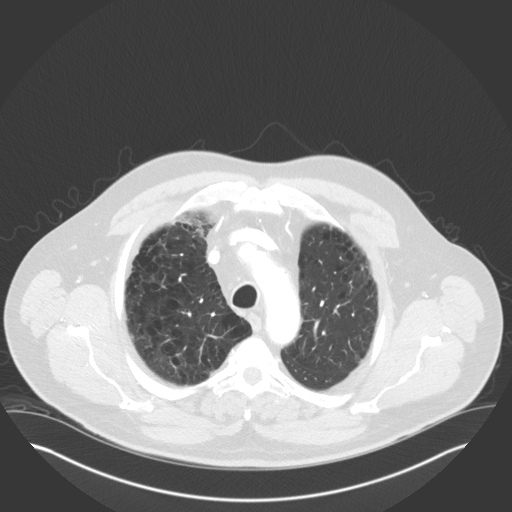
[im 132/151  mediastinal]
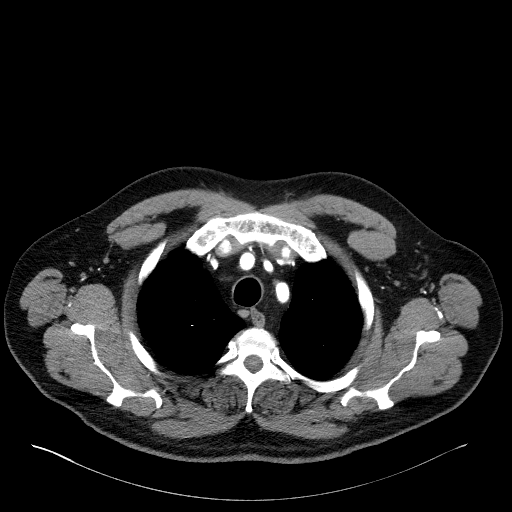
[im 144/151  lung]
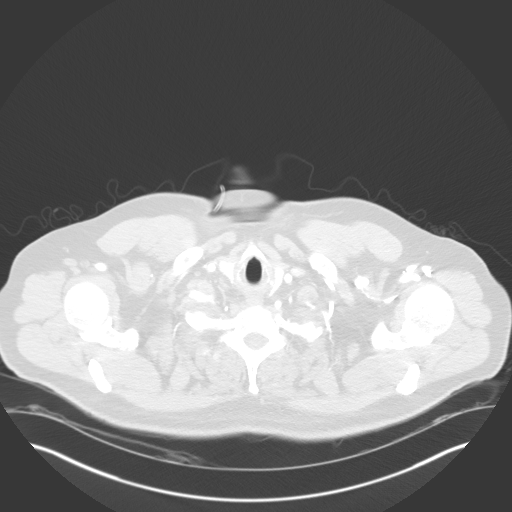

[Series 7: coronals · coronal · 0.80mm/px · 1 of 149 slices shown]
[im 75/149  mediastinal]
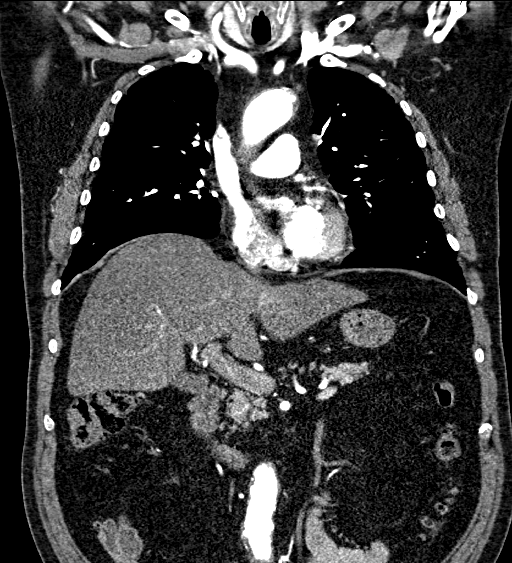

[18 of 36 positions shown; findings below may reference images not displayed]

FINDINGS: CTA CHEST FINDINGS

Cardiovascular: Aortic diameter at the annulus measures 2.5 cm.
Diameter at the sinus of Valsalva measures 4.0 cm. Diameter at the
[place] junction 3.0 cm. Diameter in the ascending aorta
measures 3.8 cm. Descending thoracic aortic diameter maximally
cm. Heart is normal size. No visible coronary artery calcifications.

Mediastinum/Nodes: No mediastinal, hilar, or axillary adenopathy.

Lungs/Pleura: Moderate to advanced emphysema. Areas of scarring
peripherally in the upper and mid lungs. No confluent opacities or
effusions.

Musculoskeletal: Chest wall soft tissues are unremarkable. No acute
bony abnormality.

Review of the MIP images confirms the above findings.

CTA ABDOMEN FINDINGS

VASCULAR

Aorta: Non aneurysmal. Maximum aortic diameter 2.5 cm distally with
mild calcified and now noncalcified plaque. No dissection.

Celiac: Widely patent.

SMA: Widely patent

Renals: 3 right renal artery is hands a single left renal artery. No
focal stenosis.

IMA: Patent

Veins: Grossly unremarkable.

Review of the MIP images confirms the above findings.

NON-VASCULAR

Hepatobiliary: No focal hepatic abnormality. Gallbladder
unremarkable.

Pancreas: No focal abnormality or ductal dilatation.

Spleen: No focal abnormality.  Normal size.

Adrenals/Urinary Tract: Scarring in the mid to lower pole of the
right kidney. No hydronephrosis or renal mass. Adrenal glands
unremarkable.

Stomach/Bowel: Descending colonic diverticulosis. No active
diverticulitis. Stomach and small bowel decompressed.

Lymphatic: No adenopathy.

Other: No free fluid or free air.

Musculoskeletal: No acute bony abnormality.

Review of the MIP images confirms the above findings. No acute
findings in the chest or abdomen.
IMPRESSION: Aortic root borderline in diameter at 4 cm at the sinuses of
Valsalva. Remainder of the aorta is normal caliber. Recommend annual
imaging followup by CTA or MRA. This recommendation follows 8626
ACCF/AHA/AATS/ACR/ASA/SCA/SANTIZ/SHABAZZ/ONTIBON/TIGER Guidelines for the
Diagnosis and Management of Patients with Thoracic Aortic Disease.
Circulation. 8626; 121: [id number]

No acute findings in the chest or abdomen.

Emphysema.

Left colonic diverticulosis.

## 2017-08-16 DIAGNOSIS — J188 Other pneumonia, unspecified organism: Secondary | ICD-10-CM | POA: Diagnosis not present

## 2017-09-15 DIAGNOSIS — J188 Other pneumonia, unspecified organism: Secondary | ICD-10-CM | POA: Diagnosis not present

## 2017-10-15 ENCOUNTER — Other Ambulatory Visit: Payer: Self-pay | Admitting: Internal Medicine

## 2017-10-15 DIAGNOSIS — I10 Essential (primary) hypertension: Secondary | ICD-10-CM

## 2017-10-15 MED ORDER — CHLORTHALIDONE 25 MG PO TABS: 25.0000 mg | ORAL_TABLET | Freq: Every day | ORAL | 1 refills | Status: DC

## 2017-10-16 DIAGNOSIS — J188 Other pneumonia, unspecified organism: Secondary | ICD-10-CM | POA: Diagnosis not present

## 2017-11-16 DIAGNOSIS — J188 Other pneumonia, unspecified organism: Secondary | ICD-10-CM | POA: Diagnosis not present

## 2017-11-29 ENCOUNTER — Other Ambulatory Visit: Payer: Self-pay | Admitting: Internal Medicine

## 2017-11-29 DIAGNOSIS — I10 Essential (primary) hypertension: Secondary | ICD-10-CM

## 2017-11-29 DIAGNOSIS — E876 Hypokalemia: Secondary | ICD-10-CM

## 2017-12-12 ENCOUNTER — Encounter: Payer: Self-pay | Admitting: Internal Medicine

## 2017-12-12 ENCOUNTER — Ambulatory Visit (INDEPENDENT_AMBULATORY_CARE_PROVIDER_SITE_OTHER): Payer: Medicare Other | Admitting: Internal Medicine

## 2017-12-12 ENCOUNTER — Other Ambulatory Visit (INDEPENDENT_AMBULATORY_CARE_PROVIDER_SITE_OTHER): Payer: Medicare Other

## 2017-12-12 ENCOUNTER — Ambulatory Visit (INDEPENDENT_AMBULATORY_CARE_PROVIDER_SITE_OTHER): Payer: Medicare Other | Admitting: *Deleted

## 2017-12-12 VITALS — BP 118/88 | HR 106 | Resp 20 | Ht 69.0 in | Wt 221.0 lb

## 2017-12-12 VITALS — BP 118/88 | HR 106 | Temp 98.0°F | Resp 20 | Ht 69.0 in | Wt 221.8 lb

## 2017-12-12 DIAGNOSIS — K2100 Gastro-esophageal reflux disease with esophagitis, without bleeding: Secondary | ICD-10-CM

## 2017-12-12 DIAGNOSIS — E118 Type 2 diabetes mellitus with unspecified complications: Secondary | ICD-10-CM | POA: Diagnosis not present

## 2017-12-12 DIAGNOSIS — I10 Essential (primary) hypertension: Secondary | ICD-10-CM

## 2017-12-12 DIAGNOSIS — I712 Thoracic aortic aneurysm, without rupture, unspecified: Secondary | ICD-10-CM

## 2017-12-12 DIAGNOSIS — E876 Hypokalemia: Secondary | ICD-10-CM

## 2017-12-12 DIAGNOSIS — E785 Hyperlipidemia, unspecified: Secondary | ICD-10-CM | POA: Diagnosis not present

## 2017-12-12 DIAGNOSIS — Z0001 Encounter for general adult medical examination with abnormal findings: Secondary | ICD-10-CM

## 2017-12-12 DIAGNOSIS — E781 Pure hyperglyceridemia: Secondary | ICD-10-CM

## 2017-12-12 DIAGNOSIS — Z0000 Encounter for general adult medical examination without abnormal findings: Secondary | ICD-10-CM

## 2017-12-12 LAB — CBC WITH DIFFERENTIAL/PLATELET
Basophils Absolute: 0.1 10*3/uL (ref 0.0–0.1)
Basophils Relative: 1.4 % (ref 0.0–3.0)
EOS ABS: 0.2 10*3/uL (ref 0.0–0.7)
EOS PCT: 2.4 % (ref 0.0–5.0)
HEMATOCRIT: 45.6 % (ref 39.0–52.0)
HEMOGLOBIN: 15.9 g/dL (ref 13.0–17.0)
LYMPHS PCT: 24.5 % (ref 12.0–46.0)
Lymphs Abs: 2.3 10*3/uL (ref 0.7–4.0)
MCHC: 34.8 g/dL (ref 30.0–36.0)
MCV: 89.2 fl (ref 78.0–100.0)
Monocytes Absolute: 1.2 10*3/uL — ABNORMAL HIGH (ref 0.1–1.0)
Monocytes Relative: 12.6 % — ABNORMAL HIGH (ref 3.0–12.0)
NEUTROS ABS: 5.6 10*3/uL (ref 1.4–7.7)
Neutrophils Relative %: 59.1 % (ref 43.0–77.0)
PLATELETS: 265 10*3/uL (ref 150.0–400.0)
RBC: 5.11 Mil/uL (ref 4.22–5.81)
RDW: 13.2 % (ref 11.5–15.5)
WBC: 9.4 10*3/uL (ref 4.0–10.5)

## 2017-12-12 LAB — LIPID PANEL
CHOLESTEROL: 212 mg/dL — AB (ref 0–200)
HDL: 39.7 mg/dL (ref >39.00)
NONHDL: 172.6
TRIGLYCERIDES: 248 mg/dL — AB (ref 0.0–149.0)
Total CHOL/HDL Ratio: 5
VLDL: 49.6 mg/dL — ABNORMAL HIGH (ref 0.0–40.0)

## 2017-12-12 LAB — BASIC METABOLIC PANEL
BUN: 15 mg/dL (ref 6–23)
CHLORIDE: 97 meq/L (ref 96–112)
CO2: 26 meq/L (ref 19–32)
CREATININE: 0.99 mg/dL (ref 0.40–1.50)
Calcium: 10.2 mg/dL (ref 8.4–10.5)
GFR: 80.9 mL/min (ref >60.00)
Glucose, Bld: 127 mg/dL — ABNORMAL HIGH (ref 70–99)
POTASSIUM: 3.6 meq/L (ref 3.5–5.1)
Sodium: 133 mEq/L — ABNORMAL LOW (ref 135–145)

## 2017-12-12 LAB — HEMOGLOBIN A1C: Hgb A1c MFr Bld: 6.7 % — ABNORMAL HIGH (ref 4.6–6.5)

## 2017-12-12 LAB — LDL CHOLESTEROL, DIRECT: Direct LDL: 137 mg/dL

## 2017-12-12 LAB — MICROALBUMIN / CREATININE URINE RATIO
CREATININE, U: 71.6 mg/dL
MICROALB/CREAT RATIO: 1 mg/g (ref 0.0–30.0)

## 2017-12-12 MED ORDER — RANITIDINE HCL 300 MG PO TABS: 300.0000 mg | ORAL_TABLET | Freq: Every day | ORAL | 1 refills | Status: DC

## 2017-12-12 MED ORDER — ICOSAPENT ETHYL 1 G PO CAPS: 2.0000 | ORAL_CAPSULE | Freq: Two times a day (BID) | ORAL | 1 refills | Status: DC

## 2017-12-12 NOTE — Patient Instructions (Addendum)
Continue doing brain stimulating activities (puzzles, reading, adult coloring books, staying active) to keep memory sharp.   Continue to eat heart healthy diet (full of fruits, vegetables, whole grains, lean protein, water--limit salt, fat, and sugar intake) and increase physical activity as tolerated.  [name] , Thank you for taking time to come for your Medicare Wellness Visit. I appreciate your ongoing commitment to your health goals. Please review the following plan we discussed and let me know if I can assist you in the future.   These are the goals we discussed: Goals    . Patient Stated     Increase my physical exercise by walking at [employer], [employer], and other places. Enjoy planting my garden and humming birds.        This is a list of the screening recommended for you and due dates:  Health Maintenance  Topic Date Due  . Colon Cancer Screening  02/14/2017  . Eye exam for diabetics  05/17/2017  . Urine Protein Check  11/08/2017  . Hemoglobin A1C  11/28/2017  . Complete foot exam   05/28/2018  . Tetanus Vaccine  01/11/2026  . Pneumococcal vaccine  Completed  . Flu Shot  Completed  .  Hepatitis C: One time screening is recommended by Center for Disease Control  (CDC) for  adults born from [birth year] through 1967.   Completed  . HIV Screening  Completed     Food Choices for Gastroesophageal Reflux Disease, Adult When you have gastroesophageal reflux disease (GERD), the foods you eat and your eating habits are very important. Choosing the right foods can help ease your discomfort. What guidelines do I need to follow?  Choose fruits, vegetables, whole grains, and low-fat dairy products.  Choose low-fat meat, fish, and poultry.  Limit fats such as oils, salad dressings, butter, nuts, and avocado.  Keep a food diary. This helps you identify foods that cause symptoms.  Avoid foods that cause symptoms. These may be different for everyone.  Eat small meals often instead of 3 large  meals a day.  Eat your meals slowly, in a place where you are relaxed.  Limit fried foods.  Cook foods using methods other than frying.  Avoid drinking alcohol.  Avoid drinking large amounts of liquids with your meals.  Avoid bending over or lying down until 2-3 hours after eating. What foods are not recommended? These are some foods and drinks that may make your symptoms worse: Vegetables Tomatoes. Tomato juice. Tomato and spaghetti sauce. Chili peppers. Onion and garlic. Horseradish. Fruits Oranges, grapefruit, and lemon (fruit and juice). Meats High-fat meats, fish, and poultry. This includes hot dogs, ribs, ham, sausage, salami, and bacon. Dairy Whole milk and chocolate milk. Sour cream. Cream. Butter. Ice cream. Cream cheese. Drinks Coffee and tea. Bubbly (carbonated) drinks or energy drinks. Condiments Hot sauce. Barbecue sauce. Sweets/Desserts Chocolate and cocoa. Donuts. Peppermint and spearmint. Fats and Oils High-fat foods. This includes [place] fries and potato chips. Other Vinegar. Strong spices. This includes black pepper, white pepper, red pepper, cayenne, curry powder, cloves, ginger, and chili powder. The items listed above may not be a complete list of foods and drinks to avoid. Contact your dietitian for more information. This information is not intended to replace advice given to you by your health care provider. Make sure you discuss any questions you have with your health care provider. Document Released: 04/02/2012 Document Revised: 03/09/2016 Document Reviewed: 08/06/2013 Elsevier Interactive Patient Education  2017 [employer]  Insomnia Insomnia is a  sleep disorder that makes it difficult to fall asleep or to stay asleep. Insomnia can cause tiredness (fatigue), low energy, difficulty concentrating, mood swings, and poor performance at work or school. There are three different ways to classify insomnia:  Difficulty falling asleep.  Difficulty  staying asleep.  Waking up too early in the morning.  Any type of insomnia can be long-term (chronic) or short-term (acute). Both are common. Short-term insomnia usually lasts for three months or less. Chronic insomnia occurs at least three times a week for longer than three months. What are the causes? Insomnia may be caused by another condition, situation, or substance, such as:  Anxiety.  Certain medicines.  Gastroesophageal reflux disease (GERD) or other gastrointestinal conditions.  Asthma or other breathing conditions.  Restless legs syndrome, sleep apnea, or other sleep disorders.  Chronic pain.  Menopause. This may include hot flashes.  Stroke.  Abuse of alcohol, tobacco, or illegal drugs.  Depression.  Caffeine.  Neurological disorders, such as Alzheimer disease.  An overactive thyroid (hyperthyroidism).  The cause of insomnia may not be known. What increases the risk? Risk factors for insomnia include:  Gender. Women are more commonly affected than men.  Age. Insomnia is more common as you get older.  Stress. This may involve your professional or personal life.  Income. Insomnia is more common in people with lower income.  Lack of exercise.  Irregular work schedule or night shifts.  Traveling between different time zones.  What are the signs or symptoms? If you have insomnia, trouble falling asleep or trouble staying asleep is the main symptom. This may lead to other symptoms, such as:  Feeling fatigued.  Feeling nervous about going to sleep.  Not feeling rested in the morning.  Having trouble concentrating.  Feeling irritable, anxious, or depressed.  How is this treated? Treatment for insomnia depends on the cause. If your insomnia is caused by an underlying condition, treatment will focus on addressing the condition. Treatment may also include:  Medicines to help you sleep.  Counseling or therapy.  Lifestyle adjustments.  Follow  these instructions at home:  Take medicines only as directed by your health care provider.  Keep regular sleeping and waking hours. Avoid naps.  Keep a sleep diary to help you and your health care provider figure out what could be causing your insomnia. Include: ? When you sleep. ? When you wake up during the night. ? How well you sleep. ? How rested you feel the next day. ? Any side effects of medicines you are taking. ? What you eat and drink.  Make your bedroom a comfortable place where it is easy to fall asleep: ? Put up shades or special blackout curtains to block light from outside. ? Use a white noise machine to block noise. ? Keep the temperature cool.  Exercise regularly as directed by your health care provider. Avoid exercising right before bedtime.  Use relaxation techniques to manage stress. Ask your health care provider to suggest some techniques that may work well for you. These may include: ? Breathing exercises. ? Routines to release muscle tension. ? Visualizing peaceful scenes.  Cut back on alcohol, caffeinated beverages, and cigarettes, especially close to bedtime. These can disrupt your sleep.  Do not overeat or eat spicy foods right before bedtime. This can lead to digestive discomfort that can make it hard for you to sleep.  Limit screen use before bedtime. This includes: ? Watching TV. ? Using your smartphone, tablet, and computer.  Stick  to a routine. This can help you fall asleep faster. Try to do a quiet activity, brush your teeth, and go to bed at the same time each night.  Get out of bed if you are still awake after 15 minutes of trying to sleep. Keep the lights down, but try reading or doing a quiet activity. When you feel sleepy, go back to bed.  Make sure that you drive carefully. Avoid driving if you feel very sleepy.  Keep all follow-up appointments as directed by your health care provider. This is important. Contact a health care provider  if:  You are tired throughout the day or have trouble in your daily routine due to sleepiness.  You continue to have sleep problems or your sleep problems get worse. Get help right away if:  You have serious thoughts about hurting yourself or someone else. This information is not intended to replace advice given to you by your health care provider. Make sure you discuss any questions you have with your health care provider. Document Released: 09/29/2000 Document Revised: 03/03/2016 Document Reviewed: 07/03/2014 Elsevier Interactive Patient Education  [employer]2018 [employer]

## 2017-12-12 NOTE — Progress Notes (Signed)
Subjective:  Patient ID: [name], male    DOB: 09-07-1956  Age: 62 y.o. MRN: [id number]  CC: Annual Exam; Gastroesophageal Reflux; and Diabetes   HPI [name] presents for a CPX.  He complains of heartburn.  He tells me he is not getting much symptom relief with Tums.  He has not tried anything else for the heartburn.  He denies odynophagia, dysphagia, loss of appetite, or weight loss.  He tells me his blood pressure has been well controlled.  He has had no recent episodes of chest pain, edema, diaphoresis, or headache.  He has a chronic nonproductive cough and shortness of breath related to his lung disease.  This has not worsened over the last year.  Past Medical History:  Diagnosis Date  . COPD (chronic obstructive pulmonary disease) ([place]) 09/23/08   -PFT 09/23/08 FEV1 2.89(84%), FVC 3.70 (78%), TLC 5.28 (78%), DLCO 64% mixed obstructive and  restrictive pattern.  Spirometry 03/11/09 FEV 3.35 (97%),FVC 4.10 (86%), FEV% 82. Steroid responsive pneumonitis 04/09- required mechanical ventilation during hospitalization - The following hospital test were negative: PCP,ANA,HIV,BAL. - ACE25,RF20,CRP 28,A1AT 263, ESR 109  . Depression   . Diabetes mellitus without complication ([place])   . Dyslipidemia   . Dyslipidemia   . GERD (gastroesophageal reflux disease)   . History of skin cancer   . Hypertension    Echo 10/09 EF 60%  . ILD (interstitial lung disease) ([place])   . Panic attack    Past Surgical History:  Procedure Laterality Date  . BRONCHOSCOPY  10/09  . Ganglion cyst on foot    . HERNIA REPAIR     2 hernia  . Left shoulder surgery      reports that he quit smoking about 14 years ago. His smoking use included cigarettes. He has a 16.00 pack-year smoking history. he has never used smokeless tobacco. He reports that he does not drink alcohol or use drugs. family history includes Diabetes in his brother and father; Emphysema in his brother; Heart attack in his brother;  Heart attack (age of onset: 25) in his father. Allergies  Allergen Reactions  . Latex   . Morphine   . Statins     REACTION: weakness, muscle pains    Outpatient Medications Prior to Visit  Medication Sig Dispense Refill  . aspirin 81 MG EC tablet Take 81 mg by mouth daily.      . chlorthalidone (HYGROTON) 25 MG tablet Take 1 tablet (25 mg total) by mouth daily. 90 tablet 1  . Evolocumab (REPATHA) 140 MG/ML SOSY Inject 140 mg into the skin every 14 (fourteen) days.    . fexofenadine (ALLEGRA) 180 MG tablet Take 180 mg by mouth daily as needed for allergies.     . Ipratropium-Albuterol (COMBIVENT RESPIMAT) 20-100 MCG/ACT AERS respimat Inhale 1 puff into the lungs every 6 (six) hours as needed for wheezing or shortness of breath. 3 Inhaler 1  . KLOR-CON M20 20 MEQ tablet TAKE 1 TABLET (20 MEQ TOTAL) BY MOUTH 2 (TWO) TIMES DAILY. 180 tablet 1  . metFORMIN (GLUCOPHAGE) 1000 MG tablet TAKE 1 TABLET (1,000 MG TOTAL) BY MOUTH 2 (TWO) TIMES DAILY WITH A MEAL. 180 tablet 1  . NON FORMULARY Oxygen continuous 1.5 liters with activity     . ONETOUCH VERIO test strip USE 1 PER DAY 100 each 2  . Omega-3 Fatty Acids (FISH OIL PO) Take 1 capsule by mouth 2 (two) times daily.     No facility-administered medications prior  to visit.     ROS Review of Systems  Constitutional: Negative.  Negative for diaphoresis, fatigue, fever and unexpected weight change.  HENT: Negative.  Negative for sinus pressure and sore throat.   Eyes: Negative for visual disturbance.  Respiratory: Positive for shortness of breath. Negative for chest tightness and wheezing.   Cardiovascular: Negative for chest pain, palpitations and leg swelling.  Gastrointestinal: Negative for abdominal pain, constipation, diarrhea, nausea and vomiting.  Endocrine: Negative.  Negative for polydipsia, polyphagia and polyuria.  Genitourinary: Positive for testicular pain. Negative for difficulty urinating, dysuria, frequency, hematuria, penile  swelling, scrotal swelling and urgency.  Musculoskeletal: Negative.  Negative for back pain and myalgias.  Skin: Negative.  Negative for color change, pallor and rash.  Allergic/Immunologic: Negative.  Negative for environmental allergies.  Neurological: Negative.  Negative for dizziness, weakness and numbness.  Hematological: Negative for adenopathy. Does not bruise/bleed easily.  Psychiatric/Behavioral: Negative.     Objective:  BP 118/88 (BP Location: Left Arm, Patient Position: Sitting, Cuff Size: Large)   Pulse (!) 106   Temp 98 F (36.7 C) (Oral)   Resp 20   Ht _0  (1.753 m)   Wt 221 lb 12 oz (100.6 kg)   SpO2 99%   BMI 32.75 kg/m   BP Readings from Last 3 Encounters:  12/12/17 118/88  12/12/17 118/88  06/11/17 136/88    Wt Readings from Last 3 Encounters:  12/12/17 221 lb (100.2 kg)  12/12/17 221 lb 12 oz (100.6 kg)  06/11/17 217 lb (98.4 kg)    Physical Exam  Constitutional: He is oriented to person, place, and time. No distress.  HENT:  Mouth/Throat: Oropharynx is clear and moist. No oropharyngeal exudate.  Eyes: Conjunctivae are normal. Left eye exhibits no discharge. No scleral icterus.  Neck: Normal range of motion. Neck supple. No JVD present. No thyromegaly present.  Cardiovascular: Normal rate, regular rhythm and normal heart sounds. Exam reveals no gallop and no friction rub.  No murmur heard. Pulmonary/Chest: Effort normal and breath sounds normal. No respiratory distress. He has no wheezes. He has no rales.  Abdominal: Soft. Bowel sounds are normal. He exhibits no distension and no mass. There is no tenderness. There is no guarding. Hernia confirmed negative in the right inguinal area and confirmed negative in the left inguinal area.  Genitourinary: Rectum normal, prostate normal, testes normal and penis normal. Rectal exam shows no external hemorrhoid, no internal hemorrhoid, no fissure, no mass, no tenderness, anal tone normal and guaiac negative  stool. Prostate is not enlarged and not tender. Right testis shows no mass, no swelling and no tenderness. Left testis shows no mass, no swelling and no tenderness. No penile erythema or penile tenderness. No discharge found.  Musculoskeletal: Normal range of motion. He exhibits no edema, tenderness or deformity.  Lymphadenopathy:    He has no cervical adenopathy.       Right: No inguinal adenopathy present.       Left: No inguinal adenopathy present.  Neurological: He is alert and oriented to person, place, and time.  Skin: Skin is warm and dry. No rash noted. He is not diaphoretic. No erythema. No pallor.  Psychiatric: He has a normal mood and affect. His behavior is normal. Judgment and thought content normal.  Vitals reviewed.   Lab Results  Component Value Date   WBC 9.4 12/12/2017   HGB 15.9 12/12/2017   HCT 45.6 12/12/2017   PLT 265.0 12/12/2017   GLUCOSE 127 (H) 12/12/2017   CHOL  212 (H) 12/12/2017   TRIG 248.0 (H) 12/12/2017   HDL 39.70 12/12/2017   LDLDIRECT 137.0 12/12/2017   LDLCALC 35 01/03/2017   ALT 35 01/03/2017   AST 20 01/03/2017   NA 133 (L) 12/12/2017   K 3.6 12/12/2017   CL 97 12/12/2017   CREATININE 0.99 12/12/2017   BUN 15 12/12/2017   CO2 26 12/12/2017   TSH 1.55 11/08/2016   PSA 0.62 11/08/2016   INR 1.2 08/04/2008   HGBA1C 6.7 (H) 12/12/2017   MICROALBUR <0.7 12/12/2017    Ct Angio Abdomen W &/or Wo Contrast  Result Date: 12/19/2016 CLINICAL DATA:  Evaluate dilated aortic root on recent ECHO EXAM: CT ANGIOGRAPHY CHEST AND ABDOMEN TECHNIQUE: Multidetector CT imaging through the chest and abdomen was performed using the standard protocol during bolus administration of intravenous contrast. Multiplanar reconstructed images and MIPs were obtained and reviewed to evaluate the vascular anatomy. CONTRAST:  100 cc Isovue 370 IV COMPARISON:  None. FINDINGS: CTA CHEST FINDINGS Cardiovascular: Aortic diameter at the annulus measures 2.5 cm. Diameter at the sinus  of Valsalva measures 4.0 cm. Diameter at the sino-tubular junction 3.0 cm. Diameter in the ascending aorta measures 3.8 cm. Descending thoracic aortic diameter maximally 2.7 cm. Heart is normal size. No visible coronary artery calcifications. Mediastinum/Nodes: No mediastinal, hilar, or axillary adenopathy. Lungs/Pleura: Moderate to advanced emphysema. Areas of scarring peripherally in the upper and mid lungs. No confluent opacities or effusions. Musculoskeletal: Chest wall soft tissues are unremarkable. No acute bony abnormality. Review of the MIP images confirms the above findings. CTA ABDOMEN FINDINGS VASCULAR Aorta: Non aneurysmal. Maximum aortic diameter 2.5 cm distally with mild calcified and now noncalcified plaque. No dissection. Celiac: Widely patent. SMA: Widely patent Renals: 3 right renal artery is hands a single left renal artery. No focal stenosis. IMA: Patent Veins: Grossly unremarkable. Review of the MIP images confirms the above findings. NON-VASCULAR Hepatobiliary: No focal hepatic abnormality. Gallbladder unremarkable. Pancreas: No focal abnormality or ductal dilatation. Spleen: No focal abnormality.  Normal size. Adrenals/Urinary Tract: Scarring in the mid to lower pole of the right kidney. No hydronephrosis or renal mass. Adrenal glands unremarkable. Stomach/Bowel: Descending colonic diverticulosis. No active diverticulitis. Stomach and small bowel decompressed. Lymphatic: No adenopathy. Other: No free fluid or free air. Musculoskeletal: No acute bony abnormality. Review of the MIP images confirms the above findings. No acute findings in the chest or abdomen. IMPRESSION: Aortic root borderline in diameter at 4 cm at the sinuses of Valsalva. Remainder of the aorta is normal caliber. Recommend annual imaging followup by CTA or MRA. This recommendation follows 2010 ACCF/AHA/AATS/ACR/ASA/SCA/SCAI/SIR/STS/SVM Guidelines for the Diagnosis and Management of Patients with Thoracic Aortic Disease.  Circulation. 2010; 121: E092-Z300 No acute findings in the chest or abdomen. Emphysema. Left colonic diverticulosis. Electronically Signed   By: Rolm Baptise M.D.   On: 12/19/2016 10:49   Ct Angio Chest Aorta W &/or Wo Contrast  Result Date: 12/19/2016 CLINICAL DATA:  Evaluate dilated aortic root on recent ECHO EXAM: CT ANGIOGRAPHY CHEST AND ABDOMEN TECHNIQUE: Multidetector CT imaging through the chest and abdomen was performed using the standard protocol during bolus administration of intravenous contrast. Multiplanar reconstructed images and MIPs were obtained and reviewed to evaluate the vascular anatomy. CONTRAST:  100 cc Isovue 370 IV COMPARISON:  None. FINDINGS: CTA CHEST FINDINGS Cardiovascular: Aortic diameter at the annulus measures 2.5 cm. Diameter at the sinus of Valsalva measures 4.0 cm. Diameter at the sino-tubular junction 3.0 cm. Diameter in the ascending aorta measures 3.8 cm.  Descending thoracic aortic diameter maximally 2.7 cm. Heart is normal size. No visible coronary artery calcifications. Mediastinum/Nodes: No mediastinal, hilar, or axillary adenopathy. Lungs/Pleura: Moderate to advanced emphysema. Areas of scarring peripherally in the upper and mid lungs. No confluent opacities or effusions. Musculoskeletal: Chest wall soft tissues are unremarkable. No acute bony abnormality. Review of the MIP images confirms the above findings. CTA ABDOMEN FINDINGS VASCULAR Aorta: Non aneurysmal. Maximum aortic diameter 2.5 cm distally with mild calcified and now noncalcified plaque. No dissection. Celiac: Widely patent. SMA: Widely patent Renals: 3 right renal artery is hands a single left renal artery. No focal stenosis. IMA: Patent Veins: Grossly unremarkable. Review of the MIP images confirms the above findings. NON-VASCULAR Hepatobiliary: No focal hepatic abnormality. Gallbladder unremarkable. Pancreas: No focal abnormality or ductal dilatation. Spleen: No focal abnormality.  Normal size.  Adrenals/Urinary Tract: Scarring in the mid to lower pole of the right kidney. No hydronephrosis or renal mass. Adrenal glands unremarkable. Stomach/Bowel: Descending colonic diverticulosis. No active diverticulitis. Stomach and small bowel decompressed. Lymphatic: No adenopathy. Other: No free fluid or free air. Musculoskeletal: No acute bony abnormality. Review of the MIP images confirms the above findings. No acute findings in the chest or abdomen. IMPRESSION: Aortic root borderline in diameter at 4 cm at the sinuses of Valsalva. Remainder of the aorta is normal caliber. Recommend annual imaging followup by CTA or MRA. This recommendation follows 2010 ACCF/AHA/AATS/ACR/ASA/SCA/SCAI/SIR/STS/SVM Guidelines for the Diagnosis and Management of Patients with Thoracic Aortic Disease. Circulation. 2010; 121: [id number] No acute findings in the chest or abdomen. Emphysema. Left colonic diverticulosis. Electronically Signed   By: Rolm Baptise M.D.   On: 12/19/2016 10:49    Assessment & Plan:   [place] was seen today for annual exam, gastroesophageal reflux and diabetes.  Diagnoses and all orders for this visit:  Essential hypertension, benign- His blood pressure is well controlled.  Electrolytes and renal function are normal. -     CBC with Differential/Platelet; Future -     Basic metabolic panel; Future  Type 2 diabetes mellitus with complication, without long-term current use of insulin ([place])- His A1c is at 6.7%.  His blood sugars are adequately well controlled.  Will continue metformin at the current dose. -     Basic metabolic panel; Future -     Hemoglobin A1c; Future -     Microalbumin / creatinine urine ratio; Future  Hypokalemia- His potassium level is barely in the normal range at 3.6.  Will continue the current potassium supplementation. -     Basic metabolic panel; Future  Pure hyperglyceridemia- His triglycerides are up to 248.  Will start Vascepa to reduce the risk of complications and for  CV risk reduction. -     Lipid panel; Future -     Icosapent Ethyl (VASCEPA) 1 g CAPS; Take 2 capsules (2 g total) by mouth 2 (two) times daily.  Hyperlipidemia with target LDL less than 130- He has not achieved his LDL goal.  He is not willing to take a statin or do a PCSK 9 inhibitor. -     Lipid panel; Future  GERD with esophagitis- Will start treating this with an H2 blocker.  Based on his symptoms and lab work there are no alarming features or complications. -     Discontinue: ranitidine (ZANTAC) 300 MG tablet; Take 1 tablet (300 mg total) by mouth at bedtime. -     ranitidine (ZANTAC) 300 MG tablet; Take 1 tablet (300 mg total) by mouth at bedtime.  Thoracic aortic aneurysm without rupture [facility])- He is due for his annual follow-up regarding this. -     Cancel: CT Angio Chest W/Cm &/Or Wo Cm; Future -     CT ANGIO CHEST AORTA W/CM &/OR WO/CM; Future   I have discontinued [place]. Glastetter's Omega-3 Fatty Acids (FISH OIL PO). I am also having him start on Icosapent Ethyl. Additionally, I am having him maintain his aspirin, NON FORMULARY, fexofenadine, Ipratropium-Albuterol, Evolocumab, ONETOUCH VERIO, metFORMIN, chlorthalidone, KLOR-CON M20, and ranitidine.  Meds ordered this encounter  Medications  . DISCONTD: ranitidine (ZANTAC) 300 MG tablet    Sig: Take 1 tablet (300 mg total) by mouth at bedtime.    Dispense:  90 tablet    Refill:  1  . Icosapent Ethyl (VASCEPA) 1 g CAPS    Sig: Take 2 capsules (2 g total) by mouth 2 (two) times daily.    Dispense:  360 capsule    Refill:  1  . ranitidine (ZANTAC) 300 MG tablet    Sig: Take 1 tablet (300 mg total) by mouth at bedtime.    Dispense:  90 tablet    Refill:  1   See AVS for instructions about healthy living and anticipatory guidance.  Follow-up: Return in about 6 months (around 06/11/2018).  Scarlette Calico, MD

## 2017-12-12 NOTE — Patient Instructions (Signed)

## 2017-12-12 NOTE — Progress Notes (Addendum)
Subjective:   [name] is a 62 y.o. male who presents for Medicare Annual/Subsequent preventive examination.  Review of Systems:  No ROS.  Medicare Wellness Visit. Additional risk factors are reflected in the social history.  Cardiac Risk Factors include: advanced age (>51mn, >>46women);dyslipidemia;diabetes mellitus;hypertension;male gender Sleep patterns: has interrupted sleep, gets up 1-2 times nightly to void and sleeps 5-6 hours nightly. Patient reports insomnia issues, discussed recommended sleep tips and stress reduction tips, education was attached to patient's AVS.  Home Safety/Smoke Alarms: Feels safe in home. Smoke alarms in place.  Living environment; residence and Firearm Safety: 1-story house/ trailer, no firearms. Lives alone, no needs for DME, good support system Seat Belt Safety/Bike Helmet: Wears seat belt.   PSA-  Lab Results  Component Value Date   PSA 0.62 11/08/2016   PSA 0.85 07/15/2015   PSA 0.57 07/13/2014       Objective:    Vitals: BP 118/88   Pulse (!) 106   Resp 20   Ht _0  (1.753 m)   Wt 221 lb (100.2 kg)   SpO2 99% Comment: 2L [place]  BMI 32.64 kg/m   Body mass index is 32.64 kg/m.  Advanced Directives 12/12/2017 11/12/2016 02/04/2016 07/16/2015 02/24/2015  Does Patient Have a Medical Advance Directive? No Yes Yes;No Yes No  Type of Advance Directive -Public librarianLiving will - HFrederikaLiving will -  Does patient want to make changes to medical advance directive? - - - No - Patient declined -  Copy of [place] Chart? - Yes - Yes -  Would patient like information on creating a medical advance directive? Yes (ED - Information included in AVS) - Yes - Educational materials given - -    Tobacco Social History   Tobacco Use  Smoking Status Former Smoker  . Packs/day: 1.00  . Years: 16.00  . Pack years: 16.00  . Types: Cigarettes  . Last attempt to quit: 10/17/2003  . Years  since quitting: 14.1  Smokeless Tobacco Never Used     Counseling given: Not Answered  Past Medical History:  Diagnosis Date  . COPD (chronic obstructive pulmonary disease) ([place] 09/23/08   -PFT 09/23/08 FEV1 2.89(84%), FVC 3.70 (78%), TLC 5.28 (78%), DLCO 64% mixed obstructive and  restrictive pattern.  Spirometry 03/11/09 FEV 3.35 (97%),FVC 4.10 (86%), FEV% 82. Steroid responsive pneumonitis 04/09- required mechanical ventilation during hospitalization - The following hospital test were negative: PCP,ANA,HIV,BAL. - ACE25,RF20,CRP 28,A1AT 263, ESR 109  . Depression   . Diabetes mellitus without complication ([place]   . Dyslipidemia   . Dyslipidemia   . GERD (gastroesophageal reflux disease)   . History of skin cancer   . Hypertension    Echo 10/09 EF 60%  . ILD (interstitial lung disease) ([place]   . Panic attack    Past Surgical History:  Procedure Laterality Date  . BRONCHOSCOPY  10/09  . Ganglion cyst on foot    . HERNIA REPAIR     2 hernia  . Left shoulder surgery     Family History  Problem Relation Age of Onset  . Heart attack Father 558 . Diabetes Father   . Heart attack Brother   . Emphysema Brother   . Diabetes Brother    Social History   Socioeconomic History  . Marital status: Divorced    Spouse name: Not on file  . Number of children: Not on file  . Years of education: Not  on file  . Highest education level: Not on file  Social Needs  . Financial resource strain: Not very hard  . Food insecurity - worry: Never true  . Food insecurity - inability: Never true  . Transportation needs - medical: No  . Transportation needs - non-medical: No  Occupational History  . Occupation: Scientist, water quality: UNEMPLOYED  Tobacco Use  . Smoking status: Former Smoker    Packs/day: 1.00    Years: 16.00    Pack years: 16.00    Types: Cigarettes    Last attempt to quit: 10/17/2003    Years since quitting: 14.1  . Smokeless tobacco: Never Used  Substance and Sexual  Activity  . Alcohol use: No    Comment: Rare beer 3-4 times per year  . Drug use: No  . Sexual activity: Not Currently  Other Topics Concern  . Not on file  Social History Narrative   Divorced. No Children   Drug use-No   Regular exercise-yes   Lives alone    Outpatient Encounter Medications as of 12/12/2017  Medication Sig  . aspirin 81 MG EC tablet Take 81 mg by mouth daily.    . chlorthalidone (HYGROTON) 25 MG tablet Take 1 tablet (25 mg total) by mouth daily.  . Evolocumab (REPATHA) 140 MG/ML SOSY Inject 140 mg into the skin every 14 (fourteen) days.  . fexofenadine (ALLEGRA) 180 MG tablet Take 180 mg by mouth daily as needed for allergies.   . Ipratropium-Albuterol (COMBIVENT RESPIMAT) 20-100 MCG/ACT AERS respimat Inhale 1 puff into the lungs every 6 (six) hours as needed for wheezing or shortness of breath.  Marland Kitchen KLOR-CON M20 20 MEQ tablet TAKE 1 TABLET (20 MEQ TOTAL) BY MOUTH 2 (TWO) TIMES DAILY.  . metFORMIN (GLUCOPHAGE) 1000 MG tablet TAKE 1 TABLET (1,000 MG TOTAL) BY MOUTH 2 (TWO) TIMES DAILY WITH A MEAL.  . NON FORMULARY Oxygen continuous 1.5 liters with activity   . ONETOUCH VERIO test strip USE 1 PER DAY  . ranitidine (ZANTAC) 300 MG tablet Take 1 tablet (300 mg total) by mouth at bedtime.  . [DISCONTINUED] Omega-3 Fatty Acids (FISH OIL PO) Take 1 capsule by mouth 2 (two) times daily.  . [DISCONTINUED] ranitidine (ZANTAC) 300 MG tablet Take 1 tablet (300 mg total) by mouth at bedtime.   No facility-administered encounter medications on file as of 12/12/2017.     Activities of Daily Living In your present state of health, do you have any difficulty performing the following activities: 12/12/2017  Hearing? N  Vision? N  Difficulty concentrating or making decisions? N  Walking or climbing stairs? N  Dressing or bathing? N  Doing errands, shopping? N  Preparing Food and eating ? N  Using the Toilet? N  In the past six months, have you accidently leaked urine? N  Do you  have problems with loss of bowel control? N  Managing your Medications? N  Managing your Finances? N  Housekeeping or managing your Housekeeping? N  Some recent data might be hidden    Patient Care Team: [name], MD as PCP - General Chesley Mires, MD as Consulting Physician (Pulmonary Disease) End, Harrell Gave, MD as Consulting Physician (Cardiology)   Assessment:   This is a routine wellness examination for [name]. Physical assessment deferred to PCP.   Exercise Activities and Dietary recommendations Current Exercise Habits: The patient does not participate in regular exercise at present(chair exercise pamphlets provided), Exercise limited by: respiratory conditions(s)  Diet (meal preparation,  eat out, water intake, caffeinated beverages, dairy products, fruits and vegetables): in general, a "healthy" diet  , well balanced   Reviewed heart healthy and diabetic diet, encouraged patient to increase daily water intake. Discussed food choices for acid reflux. Diet education was attached to patient's AVS.  Goals    . Patient Stated     Increase my physical exercise by walking at [employer], [employer], and other places. Enjoy planting my garden and humming birds.        Fall Risk Fall Risk  12/12/2017 11/12/2016 02/04/2016 07/16/2015 07/14/2014  Falls in the past year? _0     Depression Screen PHQ 2/9 Scores 12/12/2017 11/12/2016 02/04/2016 07/16/2015  PHQ - 2 Score 2 0 0 0  PHQ- 9 Score 6 - - -    Cognitive Function       Ad8 score reviewed for issues:  Issues making decisions: no  Less interest in hobbies / activities: no  Repeats questions, stories (family complaining): no  Trouble using ordinary gadgets (microwave, computer, phone):no  Forgets the month or year: no  Mismanaging finances: no  Remembering appts: no  Daily problems with thinking and/or memory: no Ad8 score is= 0  Immunization History  Administered Date(s) Administered  . Influenza Split  07/05/2011, 07/16/2012  . Influenza Whole 07/21/2009, 08/22/2010  . Influenza,inj,Quad PF,6+ Mos 06/25/2013, 07/13/2014, 07/15/2015, 08/08/2016, 06/11/2017  . Pneumococcal Conjugate-13 06/25/2013  . Pneumococcal Polysaccharide-23 08/17/2008, 08/08/2016  . Td 10/16/2005  . Tdap 01/12/2016  . Zoster 05/08/2016   Screening Tests Health Maintenance  Topic Date Due  . COLONOSCOPY  02/14/2017  . OPHTHALMOLOGY EXAM  05/17/2017  . URINE MICROALBUMIN  11/08/2017  . HEMOGLOBIN A1C  11/28/2017  . FOOT EXAM  05/28/2018  . TETANUS/TDAP  01/11/2026  . PNEUMOCOCCAL POLYSACCHARIDE VACCINE  Completed  . INFLUENZA VACCINE  Completed  . Hepatitis C Screening  Completed  . HIV Screening  Completed      Plan:     Nurse will check to see the last date patient had Cologuard Screening as patient states he had it about 2 years ago and explains due to respiratory status he cannot be put to sleep for colonoscopy.  Nurse sent zantac prescription to [facility] per patient's request as it was originally sent to [facility].   Continue doing brain stimulating activities (puzzles, reading, adult coloring books, staying active) to keep memory sharp.   Continue to eat heart healthy diet (full of fruits, vegetables, whole grains, lean protein, water--limit salt, fat, and sugar intake) and increase physical activity as tolerated.   I have personally reviewed and noted the following in the patient's chart:   . Medical and social history . Use of alcohol, tobacco or illicit drugs  . Current medications and supplements . Functional ability and status . Nutritional status . Physical activity . Advanced directives . List of other physicians . Vitals . Screenings to include cognitive, depression, and falls . Referrals and appointments  In addition, I have reviewed and discussed with patient certain preventive protocols, quality metrics, and best practice recommendations. A written personalized care plan for  preventive services as well as general preventive health recommendations were provided to patient.   Medical screening examination/treatment/procedure(s) were performed by non-physician practitioner and as supervising physician I was immediately available for consultation/collaboration. I agree with above. Scarlette Calico, MD   Michiel Cowboy, RN  12/12/2017

## 2017-12-13 DIAGNOSIS — S0501XA Injury of conjunctiva and corneal abrasion without foreign body, right eye, initial encounter: Secondary | ICD-10-CM | POA: Diagnosis not present

## 2017-12-14 ENCOUNTER — Encounter: Payer: Self-pay | Admitting: Internal Medicine

## 2017-12-14 DIAGNOSIS — J188 Other pneumonia, unspecified organism: Secondary | ICD-10-CM | POA: Diagnosis not present

## 2017-12-14 DIAGNOSIS — S0501XD Injury of conjunctiva and corneal abrasion without foreign body, right eye, subsequent encounter: Secondary | ICD-10-CM | POA: Diagnosis not present

## 2017-12-14 MED ORDER — RANITIDINE HCL 300 MG PO TABS: 300.0000 mg | ORAL_TABLET | Freq: Every day | ORAL | 1 refills | Status: DC

## 2017-12-14 NOTE — Assessment & Plan Note (Signed)

## 2017-12-17 DIAGNOSIS — S0501XD Injury of conjunctiva and corneal abrasion without foreign body, right eye, subsequent encounter: Secondary | ICD-10-CM | POA: Diagnosis not present

## 2017-12-17 LAB — HM DIABETES EYE EXAM

## 2017-12-25 ENCOUNTER — Ambulatory Visit (INDEPENDENT_AMBULATORY_CARE_PROVIDER_SITE_OTHER)
Admission: RE | Admit: 2017-12-25 | Discharge: 2017-12-25 | Disposition: A | Discharge status: Home / Self Care | Payer: Medicare Other | Source: Ambulatory Visit | Attending: Internal Medicine | Admitting: Internal Medicine

## 2017-12-25 DIAGNOSIS — I712 Thoracic aortic aneurysm, without rupture, unspecified: Secondary | ICD-10-CM

## 2017-12-25 MED ORDER — IOPAMIDOL (ISOVUE-370) INJECTION 76%
100.0000 mL | Freq: Once | INTRAVENOUS | Status: AC | PRN
  Administered 2017-12-25: 100 mL via INTRAVENOUS

## 2017-12-26 ENCOUNTER — Ambulatory Visit: Payer: Medicare Other | Admitting: Pulmonary Disease

## 2017-12-26 ENCOUNTER — Encounter: Payer: Self-pay | Admitting: Pulmonary Disease

## 2017-12-26 VITALS — BP 120/82 | HR 115 | Ht 69.0 in | Wt 222.0 lb

## 2017-12-26 DIAGNOSIS — J432 Centrilobular emphysema: Secondary | ICD-10-CM | POA: Diagnosis not present

## 2017-12-26 DIAGNOSIS — J9611 Chronic respiratory failure with hypoxia: Secondary | ICD-10-CM | POA: Diagnosis not present

## 2017-12-26 MED ORDER — TIOTROPIUM BROMIDE-OLODATEROL 2.5-2.5 MCG/ACT IN AERS: 2.0000 | INHALATION_SPRAY | Freq: Every day | RESPIRATORY_TRACT | 5 refills | Status: DC

## 2017-12-26 NOTE — Progress Notes (Signed)
[place] [facility]  Chief Complaint  Patient presents with  . Follow-up    Pt is on 1.5 liters of O2, patient SOB with exertion worsen in last 3 months. Pt is wheezing, and has chest tightness with some pain.    Vital signs: BP 120/82 (BP Location: Left Arm, Cuff Size: Normal)   Pulse (!) 115   Ht 5\' 9"  (1.753 m)   Wt 222 lb (100.7 kg)   SpO2 95%   BMI 32.78 kg/m   History of Present Illness: [name] is a 62 y.o. male former smoker with Emphysema and exertional/nocturnal hypoxemia.  Has hx of steroid responsive pneumonitis in 2009.  He has noticed more trouble with wheezing.  He is also getting winded more easily with exertion.  He has to stop to rest sometimes when he goes to the grocery store.  He had CT chest this morning and showed changes of emphysema and stable thoracic aorta.  He is not having sputum, fever, chest pain, sweats, hemoptysis, or leg swelling.  Uses 1.5 liters oxygen.  Physical Exam:  General - pleasant Eyes - pupils reactive ENT - no sinus tenderness, no oral exudate, no LAN Cardiac - regular, no murmur Chest - no wheeze, rales Abd - soft, non tender Ext - no edema Skin - no rashes Neuro - normal strength Psych - normal mood  Assessment/Plan:  Emphysema. - has progression of his symptoms - will have him try stiolto; demonstrated how to use inhaler - repeat PFTs  Chronic respiratory failure with hypoxia - continue 1.5 liters oxygen with exertion   Patient Instructions  Stiolto two puffs daily  Will schedule pulmonary function test  Follow up in 6 months    Coralyn HellingVineet Jaeden Messer, MD [facility] 12/26/2017, 12:22 PM Pager:  [phone]  Flow Sheet  Pulmonary tests: A1AT 08/12/08 >> A1AT 263 PFT 09/23/08 >> FEV1 2.89 (84%), FEV1% 78, TLC 5.28 (78%), DLCO 64% PFT 08/23/11 >> FEV1 3.68(110%), FEV1% 75, TLC 5.38(80%), DLCO 60%, no BD Spirometry 02/05/13 >> FEV1 3.62 (97%), FEV1% 79 PFT  03/12/14 >> FEV1 3.68 (109%), FEV1% 81, TLC 5.40 (81%), DLCO 71%, no BD  Cardiac tests: Echo 11/23/16 >> mild LVH, EF 60 to 65%, grade 1 DD  Past Medical History: He  has a past medical history of COPD (chronic obstructive pulmonary disease) (HCC) (09/23/08), Depression, Diabetes mellitus without complication (HCC), Dyslipidemia, Dyslipidemia, GERD (gastroesophageal reflux disease), History of skin cancer, Hypertension, ILD (interstitial lung disease) (HCC), and Panic attack.  Past Surgical History: He  has a past surgical history that includes Hernia repair; Left shoulder surgery; Ganglion cyst on foot; and Bronchoscopy (10/09).  Family History: His family history includes Diabetes in his brother and father; Emphysema in his brother; Heart attack in his brother; Heart attack (age of onset: 6752) in his father.  Social History: He  reports that he quit smoking about 14 years ago. His smoking use included cigarettes. He has a 16.00 pack-year smoking history. he has never used smokeless tobacco. He reports that he does not drink alcohol or use drugs.  Medications: Allergies as of 12/26/2017      Reactions   Latex    Morphine    Statins    REACTION: weakness, muscle pains      Medication List        Accurate as of 12/26/17 12:22 PM. Always use your most recent med list.          aspirin 81 MG EC  tablet Take 81 mg by mouth daily.   chlorthalidone 25 MG tablet Commonly known as:  HYGROTON Take 1 tablet (25 mg total) by mouth daily.   fexofenadine 180 MG tablet Commonly known as:  ALLEGRA Take 180 mg by mouth daily as needed for allergies.   Icosapent Ethyl 1 g Caps Commonly known as:  VASCEPA Take 2 capsules (2 g total) by mouth 2 (two) times daily.   Ipratropium-Albuterol 20-100 MCG/ACT Aers respimat Commonly known as:  COMBIVENT RESPIMAT Inhale 1 puff into the lungs every 6 (six) hours as needed for wheezing or shortness of breath.   KLOR-CON M20 20 MEQ tablet Generic  drug:  potassium chloride SA TAKE 1 TABLET (20 MEQ TOTAL) BY MOUTH 2 (TWO) TIMES DAILY.   metFORMIN 1000 MG tablet Commonly known as:  GLUCOPHAGE TAKE 1 TABLET (1,000 MG TOTAL) BY MOUTH 2 (TWO) TIMES DAILY WITH A MEAL.   NON FORMULARY Oxygen continuous 1.5 liters with activity   ONETOUCH VERIO test strip Generic drug:  glucose blood USE 1 PER DAY   ranitidine 300 MG tablet Commonly known as:  ZANTAC Take 1 tablet (300 mg total) by mouth at bedtime.   REPATHA 140 MG/ML Sosy Generic drug:  Evolocumab Inject 140 mg into the skin every 14 (fourteen) days.   Tiotropium Bromide-Olodaterol 2.5-2.5 MCG/ACT Aers Commonly known as:  STIOLTO RESPIMAT Inhale 2 puffs into the lungs daily.

## 2017-12-26 NOTE — Patient Instructions (Signed)
Stiolto two puffs daily  Will schedule pulmonary function test  Follow up in 6 months

## 2017-12-28 ENCOUNTER — Other Ambulatory Visit: Payer: Self-pay | Admitting: Internal Medicine

## 2017-12-28 DIAGNOSIS — E118 Type 2 diabetes mellitus with unspecified complications: Secondary | ICD-10-CM

## 2018-01-14 DIAGNOSIS — J188 Other pneumonia, unspecified organism: Secondary | ICD-10-CM | POA: Diagnosis not present

## 2018-02-07 ENCOUNTER — Telehealth: Payer: Self-pay | Admitting: Pulmonary Disease

## 2018-02-07 MED ORDER — TIOTROPIUM BROMIDE-OLODATEROL 2.5-2.5 MCG/ACT IN AERS: 2.0000 | INHALATION_SPRAY | Freq: Every day | RESPIRATORY_TRACT | 5 refills | Status: DC

## 2018-02-07 NOTE — Telephone Encounter (Signed)
Pt requesting refill on stiolto.  This has been sent to preferred pharmacy.  Nothing further needed.

## 2018-02-13 DIAGNOSIS — J188 Other pneumonia, unspecified organism: Secondary | ICD-10-CM | POA: Diagnosis not present

## 2018-03-16 DIAGNOSIS — J188 Other pneumonia, unspecified organism: Secondary | ICD-10-CM | POA: Diagnosis not present

## 2018-04-08 ENCOUNTER — Other Ambulatory Visit: Payer: Self-pay | Admitting: Internal Medicine

## 2018-04-08 DIAGNOSIS — I10 Essential (primary) hypertension: Secondary | ICD-10-CM

## 2018-04-15 DIAGNOSIS — J188 Other pneumonia, unspecified organism: Secondary | ICD-10-CM | POA: Diagnosis not present

## 2018-05-16 DIAGNOSIS — J188 Other pneumonia, unspecified organism: Secondary | ICD-10-CM | POA: Diagnosis not present

## 2018-05-18 ENCOUNTER — Other Ambulatory Visit: Payer: Self-pay | Admitting: Internal Medicine

## 2018-05-18 DIAGNOSIS — E876 Hypokalemia: Secondary | ICD-10-CM

## 2018-05-18 DIAGNOSIS — I10 Essential (primary) hypertension: Secondary | ICD-10-CM

## 2018-06-12 ENCOUNTER — Ambulatory Visit: Payer: Medicare Other | Admitting: Internal Medicine

## 2018-06-12 ENCOUNTER — Encounter: Payer: Self-pay | Admitting: Internal Medicine

## 2018-06-12 ENCOUNTER — Other Ambulatory Visit (INDEPENDENT_AMBULATORY_CARE_PROVIDER_SITE_OTHER): Payer: Medicare Other

## 2018-06-12 VITALS — BP 138/100 | HR 98 | Temp 97.7°F | Resp 20 | Ht 69.0 in | Wt 211.0 lb

## 2018-06-12 DIAGNOSIS — Z23 Encounter for immunization: Secondary | ICD-10-CM

## 2018-06-12 DIAGNOSIS — E118 Type 2 diabetes mellitus with unspecified complications: Secondary | ICD-10-CM

## 2018-06-12 DIAGNOSIS — I10 Essential (primary) hypertension: Secondary | ICD-10-CM

## 2018-06-12 DIAGNOSIS — E559 Vitamin D deficiency, unspecified: Secondary | ICD-10-CM

## 2018-06-12 LAB — BASIC METABOLIC PANEL
BUN: 14 mg/dL (ref 6–23)
CHLORIDE: 101 meq/L (ref 96–112)
CO2: 24 mEq/L (ref 19–32)
CREATININE: 1.03 mg/dL (ref 0.40–1.50)
Calcium: 9.7 mg/dL (ref 8.4–10.5)
GFR: 77.16 mL/min (ref >60.00)
Glucose, Bld: 83 mg/dL (ref 70–99)
POTASSIUM: 3.7 meq/L (ref 3.5–5.1)
Sodium: 136 mEq/L (ref 135–145)

## 2018-06-12 LAB — POCT GLYCOSYLATED HEMOGLOBIN (HGB A1C): HEMOGLOBIN A1C: 6.2 % — AB (ref 4.0–5.6)

## 2018-06-12 LAB — VITAMIN D 25 HYDROXY (VIT D DEFICIENCY, FRACTURES): VITD: 22.23 ng/mL — AB (ref 30.00–100.00)

## 2018-06-12 MED ORDER — AZILSARTAN MEDOXOMIL 40 MG PO TABS: 1.0000 | ORAL_TABLET | Freq: Every day | ORAL | 1 refills | Status: DC

## 2018-06-12 MED ORDER — CHOLECALCIFEROL 50 MCG (2000 UT) PO TABS: 2.0000 | ORAL_TABLET | Freq: Every day | ORAL | 1 refills | Status: DC

## 2018-06-12 NOTE — Patient Instructions (Signed)

## 2018-06-12 NOTE — Progress Notes (Addendum)
Subjective:  Patient ID: [name], male    DOB: 01/22/1956  Age: 62 y.o. MRN: [id number]  CC: Hypertension and Diabetes   HPI [name] presents for f/up - He tells me he is in his usual state of health with chronic, unchanged shortness of breath.  He is concerned that his blood pressure has not well controlled but he denies any recent episodes of headache, blurred vision, chest pain, palpitations, or edema.  He tells me he is compliant with chlorthalidone.  Outpatient Medications Prior to Visit  Medication Sig Dispense Refill  . aspirin 81 MG EC tablet Take 81 mg by mouth daily.      . chlorthalidone (HYGROTON) 25 MG tablet TAKE 1 TABLET BY MOUTH EVERY DAY 90 tablet 1  . fexofenadine (ALLEGRA) 180 MG tablet Take 180 mg by mouth daily as needed for allergies.     Marland Kitchen KLOR-CON M20 20 MEQ tablet TAKE 1 TABLET (20 MEQ TOTAL) BY MOUTH 2 (TWO) TIMES DAILY. 180 tablet 1  . metFORMIN (GLUCOPHAGE) 1000 MG tablet Take 1 tablet (1,000 mg total) by mouth 2 (two) times daily with a meal. 180 tablet 1  . NON FORMULARY Oxygen continuous 1.5 liters with activity     . ONETOUCH VERIO test strip USE 1 PER DAY 100 each 2  . ranitidine (ZANTAC) 300 MG tablet Take 1 tablet (300 mg total) by mouth at bedtime. 90 tablet 1  . Tiotropium Bromide-Olodaterol (STIOLTO RESPIMAT) 2.5-2.5 MCG/ACT AERS Inhale 2 puffs into the lungs daily. 1 Inhaler 5  . Ipratropium-Albuterol (COMBIVENT RESPIMAT) 20-100 MCG/ACT AERS respimat Inhale 1 puff into the lungs every 6 (six) hours as needed for wheezing or shortness of breath. 3 Inhaler 1  . Evolocumab (REPATHA) 140 MG/ML SOSY Inject 140 mg into the skin every 14 (fourteen) days.    Bess Harvest Ethyl (VASCEPA) 1 g CAPS Take 2 capsules (2 g total) by mouth 2 (two) times daily. 360 capsule 1   No facility-administered medications prior to visit.     ROS Review of Systems  Constitutional: Negative.  Negative for chills, diaphoresis, fatigue and fever.  HENT: Negative.    Eyes: Negative for visual disturbance.  Respiratory: Positive for shortness of breath. Negative for cough, chest tightness and wheezing.   Cardiovascular: Negative.  Negative for chest pain, palpitations and leg swelling.  Gastrointestinal: Negative for abdominal pain, diarrhea, nausea and vomiting.  Endocrine: Negative for polyphagia and polyuria.  Genitourinary: Negative.  Negative for difficulty urinating.  Musculoskeletal: Negative.  Negative for arthralgias and myalgias.  Skin: Negative.  Negative for color change.  Neurological: Negative.  Negative for dizziness and weakness.  Hematological: Negative for adenopathy. Does not bruise/bleed easily.  Psychiatric/Behavioral: Negative.     Objective:  BP (!) 138/100 (BP Location: Left Arm, Patient Position: Sitting, Cuff Size: Normal)   Pulse 98   Temp 97.7 F (36.5 C) (Oral)   Resp 20   Ht 5\' 9"  (1.753 m)   Wt 211 lb (95.7 kg)   SpO2 96%   BMI 31.16 kg/m   BP Readings from Last 3 Encounters:  06/12/18 (!) 138/100  12/26/17 120/82  12/12/17 118/88    Wt Readings from Last 3 Encounters:  06/12/18 211 lb (95.7 kg)  12/26/17 222 lb (100.7 kg)  12/12/17 221 lb (100.2 kg)    Physical Exam  Constitutional: He is oriented to person, place, and time. No distress.  HENT:  Mouth/Throat: Oropharynx is clear and moist. No oropharyngeal exudate.  Eyes: Conjunctivae  are normal. No scleral icterus.  Neck: Normal range of motion. Neck supple. No JVD present. No thyromegaly present.  Cardiovascular: Normal rate, regular rhythm and normal heart sounds. Exam reveals no gallop.  No murmur heard. Pulmonary/Chest: Effort normal and breath sounds normal. No respiratory distress. He has no wheezes. He has no rhonchi. He has no rales.  Abdominal: Soft. Bowel sounds are normal. He exhibits no mass. There is no hepatosplenomegaly. There is no tenderness.  Musculoskeletal: Normal range of motion. He exhibits no edema, tenderness or deformity.    Lymphadenopathy:    He has no cervical adenopathy.  Neurological: He is alert and oriented to person, place, and time.  Skin: Skin is warm and dry. No rash noted. He is not diaphoretic.  Psychiatric: He has a normal mood and affect. His behavior is normal. Judgment and thought content normal.  Vitals reviewed.   Lab Results  Component Value Date   WBC 9.4 12/12/2017   HGB 15.9 12/12/2017   HCT 45.6 12/12/2017   PLT 265.0 12/12/2017   GLUCOSE 83 06/12/2018   CHOL 212 (H) 12/12/2017   TRIG 248.0 (H) 12/12/2017   HDL 39.70 12/12/2017   LDLDIRECT 137.0 12/12/2017   LDLCALC 35 01/03/2017   ALT 35 01/03/2017   AST 20 01/03/2017   NA 136 06/12/2018   K 3.7 06/12/2018   CL 101 06/12/2018   CREATININE 1.03 06/12/2018   BUN 14 06/12/2018   CO2 24 06/12/2018   TSH 1.55 11/08/2016   PSA 0.62 11/08/2016   INR 1.2 08/04/2008   HGBA1C 6.2 (A) 06/12/2018   MICROALBUR <0.7 12/12/2017    Ct Angio Chest Aorta W/cm &/or Wo/cm  Result Date: 12/26/2017 CLINICAL DATA:  Thoracic aortic aneurysm EXAM: CT ANGIOGRAPHY CHEST WITH CONTRAST TECHNIQUE: Multidetector CT imaging of the chest was performed using the standard protocol during bolus administration of intravenous contrast. Multiplanar CT image reconstructions and MIPs were obtained to evaluate the vascular anatomy. CONTRAST:  ISOVUE-370 IOPAMIDOL (ISOVUE-370) INJECTION 76% COMPARISON:  12/19/2016 and previous FINDINGS: Cardiovascular: Satisfactory opacification of pulmonary arteries noted, and there is no evidence of pulmonary emboli. Thoracic aorta transverse diameters as follows: 4.3 cm sinuses of Valsalva (previously 4.2 by my measurement) 3.3 cm sino-tubular junction 3.9 cm mid ascending 3.8 cm distal ascending/proximal arch 3.1 cm mid arch (stable) 3.4 cm proximal descending 2.6 cm distal descending Negative for dissection or stenosis. Classic 3 vessel brachiocephalic arterial origin anatomy without proximal stenosis. Scattered calcified  plaque in the arch and descending thoracic segment. Visualized proximal abdominal aorta unremarkable. Mediastinum/Nodes: No pericardial effusion. No mediastinal hematoma. No hilar or mediastinal adenopathy. Lungs/Pleura: No pleural effusion. No pneumothorax. Emphysematous changes most marked in the upper lobes. No nodule or infiltrate. Upper Abdomen: Fatty liver.  No acute findings. Musculoskeletal: Spondylitic changes in the visualized lower cervical spine. No fracture or worrisome bone lesion. Review of the MIP images confirms the above findings. IMPRESSION: 1. Stable ectasia of the thoracic aorta involving aortic root through the arch. Recommend annual imaging followup by CTA or MRA. This recommendation follows 2010 ACCF/AHA/AATS/ACR/ASA/SCA/SCAI/SIR/STS/SVM Guidelines for the Diagnosis and Management of Patients with Thoracic Aortic Disease. Circulation.2010; 121: Z610-R604 2. Pulmonary emphysema 3. Fatty liver Electronically Signed   By: Corlis Leak M.D.   On: 12/26/2017 08:40    Assessment & Plan:   Zakar was seen today for hypertension and diabetes.  Diagnoses and all orders for this visit:  Need for influenza vaccination -     Flu Vaccine QUAD 36+ mos IM  Type 2 diabetes mellitus with complication, without long-term current use of insulin (HCC)- His blood sugars are adequately well controlled.  Will continue metformin at the current dose.  Will add an ARB for renal protection. -     POCT glycosylated hemoglobin (Hb A1C) -     Basic metabolic panel; Future -     HM Diabetes Foot Exam -     Azilsartan Medoxomil (EDARBI) 40 MG TABS; Take 1 tablet by mouth daily.  Essential hypertension, benign- His blood pressure is not adequately well controlled.  I will treat the vitamin D insufficiency.  Will also add an ARB to the thiazide diuretic. -     Basic metabolic panel; Future -     VITAMIN D 25 Hydroxy (Vit-D Deficiency, Fractures); Future -     Azilsartan Medoxomil (EDARBI) 40 MG TABS; Take 1  tablet by mouth daily.  Vitamin D deficiency disease -     Cholecalciferol 2000 units TABS; Take 2 tablets (4,000 Units total) by mouth daily.   I have discontinued Darel HongLuther S. Falero's Ipratropium-Albuterol, Evolocumab, and Icosapent Ethyl. I am also having him start on Azilsartan Medoxomil and Cholecalciferol. Additionally, I am having him maintain his aspirin, NON FORMULARY, fexofenadine, ONETOUCH VERIO, ranitidine, metFORMIN, Tiotropium Bromide-Olodaterol, chlorthalidone, and KLOR-CON M20.  Meds ordered this encounter  Medications  . Azilsartan Medoxomil (EDARBI) 40 MG TABS    Sig: Take 1 tablet by mouth daily.    Dispense:  90 tablet    Refill:  1  . Cholecalciferol 2000 units TABS    Sig: Take 2 tablets (4,000 Units total) by mouth daily.    Dispense:  180 tablet    Refill:  1     Follow-up: Return in about 3 months (around 09/12/2018).  Sanda Lingerhomas Skyy Mcknight, MD

## 2018-06-16 DIAGNOSIS — J188 Other pneumonia, unspecified organism: Secondary | ICD-10-CM | POA: Diagnosis not present

## 2018-06-17 ENCOUNTER — Other Ambulatory Visit: Payer: Self-pay | Admitting: Internal Medicine

## 2018-06-17 DIAGNOSIS — E118 Type 2 diabetes mellitus with unspecified complications: Secondary | ICD-10-CM

## 2018-07-01 ENCOUNTER — Ambulatory Visit (INDEPENDENT_AMBULATORY_CARE_PROVIDER_SITE_OTHER): Payer: Medicare Other | Admitting: Pulmonary Disease

## 2018-07-01 ENCOUNTER — Ambulatory Visit: Payer: Medicare Other | Admitting: Pulmonary Disease

## 2018-07-01 ENCOUNTER — Encounter: Payer: Self-pay | Admitting: Pulmonary Disease

## 2018-07-01 VITALS — BP 126/80 | HR 109 | Ht 67.0 in | Wt 207.0 lb

## 2018-07-01 DIAGNOSIS — J9611 Chronic respiratory failure with hypoxia: Secondary | ICD-10-CM

## 2018-07-01 DIAGNOSIS — J432 Centrilobular emphysema: Secondary | ICD-10-CM

## 2018-07-01 LAB — PULMONARY FUNCTION TEST
DL/VA % pred: 81 %
DL/VA: 3.61 ml/min/mmHg/L
DLCO UNC % PRED: 71 %
DLCO unc: 20.33 ml/min/mmHg
FEF 25-75 Post: 3.84 L/sec
FEF 25-75 Pre: 3.9 L/sec
FEF2575-%CHANGE-POST: -1 %
FEF2575-%PRED-POST: 154 %
FEF2575-%PRED-PRE: 157 %
FEV1-%Change-Post: 0 %
FEV1-%PRED-POST: 119 %
FEV1-%Pred-Pre: 119 %
FEV1-PRE: 3.7 L
FEV1-Post: 3.69 L
FEV1FVC-%CHANGE-POST: 0 %
FEV1FVC-%Pred-Pre: 106 %
FEV6-%Change-Post: 0 %
FEV6-%Pred-Post: 116 %
FEV6-%Pred-Pre: 117 %
FEV6-PRE: 4.6 L
FEV6-Post: 4.58 L
FEV6FVC-%Change-Post: 0 %
FEV6FVC-%Pred-Post: 106 %
FEV6FVC-%Pred-Pre: 105 %
FVC-%CHANGE-POST: 0 %
FVC-%PRED-POST: 110 %
FVC-%PRED-PRE: 111 %
FVC-POST: 4.58 L
FVC-PRE: 4.62 L
POST FEV1/FVC RATIO: 80 %
POST FEV6/FVC RATIO: 100 %
PRE FEV1/FVC RATIO: 80 %
Pre FEV6/FVC Ratio: 100 %
RV % pred: 72 %
RV: 1.58 L
TLC % pred: 95 %
TLC: 6.13 L

## 2018-07-01 NOTE — Progress Notes (Signed)
PFT completed today. 07/01/18  

## 2018-07-01 NOTE — Progress Notes (Signed)
[facility]  Chief Complaint  Patient presents with  . Follow-up    PFT today. Pt is having blood pressure dropping at times, and increase of SOB with exertion. Pt has productive cough-white, and some wheezing.    Constitutional: BP 126/80 (BP Location: Left Arm, Cuff Size: Normal)   Pulse (!) 109   Ht 5\' 7"  (1.702 m)   Wt 207 lb (93.9 kg)   SpO2 96%   BMI 32.42 kg/m   History of Present Illness: [name] is a 62 y.o. male former smoker with Emphysema and exertional/nocturnal hypoxemia.  Has hx of steroid responsive pneumonitis in 2009.  He had PFT today.  No significant change compared to 2015.  CT chest from March (reviewed by me) showed stable changes of emphysema.  He has shortness of breath if he does too much.  Occasional cough and wheeze.  Not having sputum, fever, hemoptysis, or leg swelling.  Lost several family members recently.  Has been feeling down.  Not much appetite and has lost some weight.  He wants a lighter oxygen set up.   Comprehensive Respiratory Exam:  Appearance - well kempt  ENMT - nasal mucosa moist, turbinates clear, midline nasal septum, no dental lesions, no gingival bleeding, no oral exudates, no tonsillar hypertrophy Neck - no masses, trachea midline, no thyromegaly, no elevation in JVP Respiratory - normal appearance of chest wall, normal respiratory effort w/o accessory muscle use, no dullness on percussion, no wheezing or rales CV - s1s2 regular rate and rhythm, no murmurs, no peripheral edema, radial pulses symmetric GI - soft, non tender, no masses Lymph - no adenopathy noted in neck and axillary areas MSK - normal muscle strength and tone, normal gait Ext - no cyanosis, clubbing, or joint inflammation noted Skin - no rashes, lesions, or ulcers Neuro - oriented to person, place, and time Psych - normal mood and affect   Assessment/Plan:  Emphysema. - CT chest and PFT findings stable -  continue stiolto  Chronic respiratory failure with hypoxia. - continue 1.5 liters with exertion - he will call if he wants to arrange for lighter weight oxygen setup   Patient Instructions  Call if you want to arrange lighter weight oxygen set up   Follow up in 1 year    Coralyn HellingVineet Boy Delamater, MD [facility] 07/01/2018, 3:44 PM Pager:  [phone]  Flow Sheet  Pulmonary tests: A1AT 08/12/08 >> A1AT 263 PFT 09/23/08 >> FEV1 2.89 (84%), FEV1% 78, TLC 5.28 (78%), DLCO 64% PFT 08/23/11 >> FEV1 3.68(110%), FEV1% 75, TLC 5.38(80%), DLCO 60%, no BD Spirometry 02/05/13 >> FEV1 3.62 (97%), FEV1% 79 PFT 03/12/14 >> FEV1 3.68 (109%), FEV1% 81, TLC 5.40 (81%), DLCO 71%, no BD PFT 07/01/18 >> FEV1 3.70 (119%), FEV1% 80, TLC 6.13 (95%), DLCO 71%, no BD  Chest imaging: CT angio chest 12/26/17 >> paraseptal and centrilobular emphysema  Cardiac tests: Echo 11/23/16 >> mild LVH, EF 60 to 65%, grade 1 DD  Past Medical History: He  has a past medical history of COPD (chronic obstructive pulmonary disease) (HCC) (09/23/08), Depression, Diabetes mellitus without complication (HCC), Dyslipidemia, Dyslipidemia, GERD (gastroesophageal reflux disease), History of skin cancer, Hypertension, ILD (interstitial lung disease) (HCC), and Panic attack.  Past Surgical History: He  has a past surgical history that includes Hernia repair; Left shoulder surgery; Ganglion cyst on foot; and Bronchoscopy (10/09).  Family History: His family history includes Diabetes in his brother and father; Emphysema in his brother; Heart attack  in his brother; Heart attack (age of onset: 32) in his father.  Social History: He  reports that he quit smoking about 14 years ago. His smoking use included cigarettes. He has a 16.00 pack-year smoking history. He has never used smokeless tobacco. He reports that he does not drink alcohol or use drugs.  Medications: Allergies as of 07/01/2018      Reactions   Latex     Morphine    Statins    REACTION: weakness, muscle pains      Medication List        Accurate as of 07/01/18  3:44 PM. Always use your most recent med list.          aspirin 81 MG EC tablet Take 81 mg by mouth daily.   Azilsartan Medoxomil 40 MG Tabs Take 1 tablet by mouth daily.   chlorthalidone 25 MG tablet Commonly known as:  HYGROTON TAKE 1 TABLET BY MOUTH EVERY DAY   Cholecalciferol 2000 units Tabs Take 2 tablets (4,000 Units total) by mouth daily.   CVS D3 2000 units Caps Generic drug:  Cholecalciferol TAKE 2 CAPSULES (4,000 UNITS TOTAL) BY MOUTH DAILY.   fexofenadine 180 MG tablet Commonly known as:  ALLEGRA Take 180 mg by mouth daily as needed for allergies.   KLOR-CON M20 20 MEQ tablet Generic drug:  potassium chloride SA TAKE 1 TABLET (20 MEQ TOTAL) BY MOUTH 2 (TWO) TIMES DAILY.   metFORMIN 1000 MG tablet Commonly known as:  GLUCOPHAGE TAKE 1 TABLET (1,000 MG TOTAL) BY MOUTH 2 (TWO) TIMES DAILY WITH A MEAL.   NON FORMULARY Oxygen continuous 1.5 liters with activity   ONETOUCH VERIO test strip Generic drug:  glucose blood USE 1 PER DAY   ranitidine 300 MG tablet Commonly known as:  ZANTAC Take 1 tablet (300 mg total) by mouth at bedtime.   Tiotropium Bromide-Olodaterol 2.5-2.5 MCG/ACT Aers Inhale 2 puffs into the lungs daily.

## 2018-07-01 NOTE — Patient Instructions (Signed)
Call if you want to arrange lighter weight oxygen set up   Follow up in 1 year

## 2018-07-16 DIAGNOSIS — J188 Other pneumonia, unspecified organism: Secondary | ICD-10-CM | POA: Diagnosis not present

## 2018-08-06 DIAGNOSIS — E119 Type 2 diabetes mellitus without complications: Secondary | ICD-10-CM | POA: Diagnosis not present

## 2018-08-06 LAB — HM DIABETES EYE EXAM

## 2018-08-09 ENCOUNTER — Encounter: Payer: Self-pay | Admitting: Internal Medicine

## 2018-08-14 DIAGNOSIS — J188 Other pneumonia, unspecified organism: Secondary | ICD-10-CM | POA: Diagnosis not present

## 2018-08-16 DIAGNOSIS — J188 Other pneumonia, unspecified organism: Secondary | ICD-10-CM | POA: Diagnosis not present

## 2018-09-01 ENCOUNTER — Other Ambulatory Visit: Payer: Self-pay | Admitting: Pulmonary Disease

## 2018-09-09 ENCOUNTER — Encounter: Payer: Self-pay | Admitting: Internal Medicine

## 2018-09-09 ENCOUNTER — Ambulatory Visit (INDEPENDENT_AMBULATORY_CARE_PROVIDER_SITE_OTHER)
Admission: RE | Admit: 2018-09-09 | Discharge: 2018-09-09 | Disposition: A | Discharge status: Home / Self Care | Payer: Medicare Other | Source: Ambulatory Visit | Attending: Internal Medicine | Admitting: Internal Medicine

## 2018-09-09 ENCOUNTER — Ambulatory Visit (INDEPENDENT_AMBULATORY_CARE_PROVIDER_SITE_OTHER): Payer: Medicare Other | Admitting: Internal Medicine

## 2018-09-09 ENCOUNTER — Other Ambulatory Visit (INDEPENDENT_AMBULATORY_CARE_PROVIDER_SITE_OTHER): Payer: Medicare Other

## 2018-09-09 VITALS — BP 120/80 | HR 90 | Temp 97.8°F | Resp 16 | Ht 67.0 in | Wt 212.5 lb

## 2018-09-09 DIAGNOSIS — E876 Hypokalemia: Secondary | ICD-10-CM | POA: Diagnosis not present

## 2018-09-09 DIAGNOSIS — G8929 Other chronic pain: Secondary | ICD-10-CM

## 2018-09-09 DIAGNOSIS — I10 Essential (primary) hypertension: Secondary | ICD-10-CM | POA: Diagnosis not present

## 2018-09-09 DIAGNOSIS — E118 Type 2 diabetes mellitus with unspecified complications: Secondary | ICD-10-CM

## 2018-09-09 DIAGNOSIS — K2100 Gastro-esophageal reflux disease with esophagitis, without bleeding: Secondary | ICD-10-CM

## 2018-09-09 DIAGNOSIS — M25511 Pain in right shoulder: Secondary | ICD-10-CM

## 2018-09-09 LAB — BASIC METABOLIC PANEL
BUN: 22 mg/dL (ref 6–23)
CHLORIDE: 100 meq/L (ref 96–112)
CO2: 24 meq/L (ref 19–32)
Calcium: 10.1 mg/dL (ref 8.4–10.5)
Creatinine, Ser: 1.02 mg/dL (ref 0.40–1.50)
GFR: 77.97 mL/min (ref >60.00)
Glucose, Bld: 136 mg/dL — ABNORMAL HIGH (ref 70–99)
Potassium: 4.3 mEq/L (ref 3.5–5.1)
Sodium: 134 mEq/L — ABNORMAL LOW (ref 135–145)

## 2018-09-09 LAB — HEMOGLOBIN A1C: Hgb A1c MFr Bld: 6.6 % — ABNORMAL HIGH (ref 4.6–6.5)

## 2018-09-09 MED ORDER — FAMOTIDINE 40 MG PO TABS: 40.0000 mg | ORAL_TABLET | Freq: Every day | ORAL | 1 refills | Status: DC

## 2018-09-09 MED ORDER — AZILSARTAN MEDOXOMIL 40 MG PO TABS: 1.0000 | ORAL_TABLET | Freq: Every day | ORAL | 1 refills | Status: DC

## 2018-09-09 NOTE — Patient Instructions (Signed)

## 2018-09-09 NOTE — Progress Notes (Signed)
Subjective:  Patient ID: [name], male    DOB: [birth year]  Age: 62 y.o. MRN: [id number]  CC: Gastroesophageal Reflux and Hypertension   HPI DEANGLEO PASSAGE presents for f/up - He complains of a 1 year history of nontraumatic right shoulder pain.  He otherwise feels well and offers no other complaints.  Zantac has been recalled and he needs a new PPI for GERD.  Outpatient Medications Prior to Visit  Medication Sig Dispense Refill  . aspirin 81 MG EC tablet Take 81 mg by mouth daily.      . chlorthalidone (HYGROTON) 25 MG tablet TAKE 1 TABLET BY MOUTH EVERY DAY 90 tablet 1  . CVS D3 2000 units CAPS TAKE 2 CAPSULES (4,000 UNITS TOTAL) BY MOUTH DAILY.  1  . fexofenadine (ALLEGRA) 180 MG tablet Take 180 mg by mouth daily as needed for allergies.     Marland Kitchen KLOR-CON M20 20 MEQ tablet TAKE 1 TABLET (20 MEQ TOTAL) BY MOUTH 2 (TWO) TIMES DAILY. 180 tablet 1  . metFORMIN (GLUCOPHAGE) 1000 MG tablet TAKE 1 TABLET (1,000 MG TOTAL) BY MOUTH 2 (TWO) TIMES DAILY WITH A MEAL. (Patient taking differently: Take 500 mg by mouth 2 (two) times daily with a meal. ) 180 tablet 1  . NON FORMULARY Oxygen continuous 1.5 liters with activity     . ONETOUCH VERIO test strip USE 1 PER DAY 100 each 2  . STIOLTO RESPIMAT 2.5-2.5 MCG/ACT AERS INHALE 2 PUFFS INTO THE LUNGS DAILY. 1 Inhaler 5  . Azilsartan Medoxomil (EDARBI) 40 MG TABS Take 1 tablet by mouth daily. (Patient taking differently: Take 0.5 tablets by mouth daily. ) 90 tablet 1  . ranitidine (ZANTAC) 300 MG tablet Take 1 tablet (300 mg total) by mouth at bedtime. 90 tablet 1  . Cholecalciferol 2000 units TABS Take 2 tablets (4,000 Units total) by mouth daily. 180 tablet 1   No facility-administered medications prior to visit.     ROS Review of Systems  Constitutional: Negative for diaphoresis and fatigue.  HENT: Negative.   Eyes: Negative for visual disturbance.  Respiratory: Negative for cough, chest tightness and shortness of breath.   Cardiovascular:  Negative for chest pain, palpitations and leg swelling.  Gastrointestinal: Negative for abdominal pain, constipation, diarrhea, nausea and vomiting.  Genitourinary: Negative.   Musculoskeletal: Positive for arthralgias. Negative for myalgias.  Skin: Negative.   Neurological: Negative.  Negative for dizziness and light-headedness.  Hematological: Negative for adenopathy. Does not bruise/bleed easily.  Psychiatric/Behavioral: Negative.     Objective:  BP 120/80 (BP Location: Left Arm, Patient Position: Sitting, Cuff Size: Normal)   Pulse (!) 113   Temp 97.8 F (36.6 C) (Oral)   Ht 5\' 7"  (1.702 m)   Wt 212 lb 8 oz (96.4 kg)   SpO2 97%   BMI 33.28 kg/m   BP Readings from Last 3 Encounters:  09/09/18 120/80  07/01/18 126/80  06/12/18 (!) 138/100    Wt Readings from Last 3 Encounters:  09/09/18 212 lb 8 oz (96.4 kg)  07/01/18 207 lb (93.9 kg)  06/12/18 211 lb (95.7 kg)    Physical Exam  Constitutional: He is oriented to person, place, and time. No distress.  HENT:  Mouth/Throat: Oropharynx is clear and moist. No oropharyngeal exudate.  Eyes: Conjunctivae are normal. No scleral icterus.  Neck: Normal range of motion. Neck supple. No JVD present. No thyromegaly present.  Cardiovascular: Normal rate, regular rhythm and normal heart sounds.  No murmur heard. Pulmonary/Chest: Effort normal  and breath sounds normal. He has no wheezes. He has no rales.  Abdominal: Soft. Bowel sounds are normal. He exhibits no mass. There is no tenderness.  Musculoskeletal: Normal range of motion. He exhibits no edema, tenderness or deformity.       Right shoulder: He exhibits normal range of motion, no tenderness, no bony tenderness, no swelling and no effusion.  Lymphadenopathy:    He has no cervical adenopathy.  Neurological: He is alert and oriented to person, place, and time.  Skin: Skin is warm and dry. No rash noted. He is not diaphoretic.  Vitals reviewed.   Lab Results  Component Value  Date   WBC 9.4 12/12/2017   HGB 15.9 12/12/2017   HCT 45.6 12/12/2017   PLT 265.0 12/12/2017   GLUCOSE 136 (H) 09/09/2018   CHOL 212 (H) 12/12/2017   TRIG 248.0 (H) 12/12/2017   HDL 39.70 12/12/2017   LDLDIRECT 137.0 12/12/2017   LDLCALC 35 01/03/2017   ALT 35 01/03/2017   AST 20 01/03/2017   NA 134 (L) 09/09/2018   K 4.3 09/09/2018   CL 100 09/09/2018   CREATININE 1.02 09/09/2018   BUN 22 09/09/2018   CO2 24 09/09/2018   TSH 1.55 11/08/2016   PSA 0.62 11/08/2016   INR 1.2 08/04/2008   HGBA1C 6.6 (H) 09/09/2018   MICROALBUR <0.7 12/12/2017    Ct Angio Chest Aorta W/cm &/or Wo/cm  Result Date: 12/26/2017 CLINICAL DATA:  Thoracic aortic aneurysm EXAM: CT ANGIOGRAPHY CHEST WITH CONTRAST TECHNIQUE: Multidetector CT imaging of the chest was performed using the standard protocol during bolus administration of intravenous contrast. Multiplanar CT image reconstructions and MIPs were obtained to evaluate the vascular anatomy. CONTRAST:  100mL ISOVUE-370 IOPAMIDOL (ISOVUE-370) INJECTION 76% COMPARISON:  12/19/2016 and previous FINDINGS: Cardiovascular: Satisfactory opacification of pulmonary arteries noted, and there is no evidence of pulmonary emboli. Thoracic aorta transverse diameters as follows: 4.3 cm sinuses of Valsalva (previously 4.2 by my measurement) 3.3 cm sino-tubular junction 3.9 cm mid ascending 3.8 cm distal ascending/proximal arch 3.1 cm mid arch (stable) 3.4 cm proximal descending 2.6 cm distal descending Negative for dissection or stenosis. Classic 3 vessel brachiocephalic arterial origin anatomy without proximal stenosis. Scattered calcified plaque in the arch and descending thoracic segment. Visualized proximal abdominal aorta unremarkable. Mediastinum/Nodes: No pericardial effusion. No mediastinal hematoma. No hilar or mediastinal adenopathy. Lungs/Pleura: No pleural effusion. No pneumothorax. Emphysematous changes most marked in the upper lobes. No nodule or infiltrate. Upper  Abdomen: Fatty liver.  No acute findings. Musculoskeletal: Spondylitic changes in the visualized lower cervical spine. No fracture or worrisome bone lesion. Review of the MIP images confirms the above findings. IMPRESSION: 1. Stable ectasia of the thoracic aorta involving aortic root through the arch. Recommend annual imaging followup by CTA or MRA. This recommendation follows 2010 ACCF/AHA/AATS/ACR/ASA/SCA/SCAI/SIR/STS/SVM Guidelines for the Diagnosis and Management of Patients with Thoracic Aortic Disease. Circulation.2010; 121: [id number] 2. Pulmonary emphysema 3. Fatty liver Electronically Signed   By: Corlis Leak  Hassell M.D.   On: 12/26/2017 08:40    Assessment & Plan:   [name] was seen today for gastroesophageal reflux and hypertension.  Diagnoses and all orders for this visit:  Chronic right shoulder pain- I am concerned he may have a rotator cuff issue.  I have asked him to see sports medicine. -     DG Shoulder Right; Future -     Ambulatory referral to Sports Medicine  Essential hypertension, benign- His BP is well controlled.  Electrolytes and renal function are normal. -  Azilsartan Medoxomil (EDARBI) 40 MG TABS; Take 1 tablet by mouth daily. -     Basic metabolic panel; Future  Hypokalemia- His potassium level is normal now. -     Basic metabolic panel; Future  GERD with esophagitis -     famotidine (PEPCID) 40 MG tablet; Take 1 tablet (40 mg total) by mouth daily.  Type 2 diabetes mellitus with complication, without long-term current use of insulin (HCC)- His A1c is at 6.6%.  His blood sugars are adequately well controlled.  Will continue the current dose of metformin. -     Azilsartan Medoxomil (EDARBI) 40 MG TABS; Take 1 tablet by mouth daily. -     Basic metabolic panel; Future -     Hemoglobin A1c; Future   I have discontinued Merrill Villarruel. [place]'s ranitidine and Cholecalciferol. I am also having him start on famotidine. Additionally, I am having him maintain his aspirin, NON  FORMULARY, fexofenadine, ONETOUCH VERIO, chlorthalidone, KLOR-CON M20, metFORMIN, CVS D3, STIOLTO RESPIMAT, and Azilsartan Medoxomil.  Meds ordered this encounter  Medications  . famotidine (PEPCID) 40 MG tablet    Sig: Take 1 tablet (40 mg total) by mouth daily.    Dispense:  90 tablet    Refill:  1  . Azilsartan Medoxomil (EDARBI) 40 MG TABS    Sig: Take 1 tablet by mouth daily.    Dispense:  90 tablet    Refill:  1     Follow-up: Return in about 4 months (around 01/08/2019).  Sanda Linger, MD

## 2018-09-10 ENCOUNTER — Encounter: Payer: Self-pay | Admitting: Internal Medicine

## 2018-09-15 DIAGNOSIS — J188 Other pneumonia, unspecified organism: Secondary | ICD-10-CM | POA: Diagnosis not present

## 2018-09-28 ENCOUNTER — Other Ambulatory Visit: Payer: Self-pay | Admitting: Internal Medicine

## 2018-09-28 DIAGNOSIS — I10 Essential (primary) hypertension: Secondary | ICD-10-CM
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
## 2018-10-14 ENCOUNTER — Ambulatory Visit: Payer: Medicare Other | Admitting: Family Medicine

## 2018-10-14 ENCOUNTER — Encounter: Payer: Self-pay | Admitting: Family Medicine

## 2018-10-14 ENCOUNTER — Ambulatory Visit: Payer: Self-pay

## 2018-10-14 VITALS — BP 142/92 | HR 91 | Ht 67.0 in | Wt 217.0 lb

## 2018-10-14 DIAGNOSIS — M75101 Unspecified rotator cuff tear or rupture of right shoulder, not specified as traumatic: Secondary | ICD-10-CM | POA: Diagnosis not present

## 2018-10-14 DIAGNOSIS — M25511 Pain in right shoulder: Secondary | ICD-10-CM

## 2018-10-14 DIAGNOSIS — G8929 Other chronic pain: Secondary | ICD-10-CM

## 2018-10-14 NOTE — Patient Instructions (Signed)
Good to see you  Gustavus Bryantce is your friend.zice Exercises 3 times a week.  Keep hands within peripheral vision  See me again in 4 weeks and if not a lot better we will do injection  Happy New Year!

## 2018-10-14 NOTE — Assessment & Plan Note (Signed)
Rotator cuff tear.  Discussed icing regimen and home exercise.  Discussed which activities to do which wants to avoid.  Patient is to increase activity slowly over the course the next several days.  Patient could have an injection and follow-up if not much better.  Patient declined formal physical therapy.  Follow-up again in 4 to 6 weeks

## 2018-10-16 DIAGNOSIS — J188 Other pneumonia, unspecified organism: Secondary | ICD-10-CM | POA: Diagnosis not present

## 2018-11-13 NOTE — Progress Notes (Signed)
[name] [place], [place] [address] Phone: [phone] Subjective:    I [name] am serving as a Education administrator for Dr. Hulan Saas.   CC: Shoulder pain  BPP:[id number]  [name] is a 63 y.o. male coming in with complaint of shoulder pain. Shoulder pain is not as bad but it still gets sore.  Patient was initially seen and was found to have more of a rotator cuff degenerative tear on the right side.  Discussed icing regimen and home exercises.  Discussed topical anti-inflammatories.  Discussed certain activities and will be more beneficial.  Patient states that unfortunately continues to have significant amount of pain.  Patient has not been very compliant with the exercises.     Past Medical History:  Diagnosis Date  . COPD (chronic obstructive pulmonary disease) ([place]) 09/23/08   -PFT 09/23/08 FEV1 2.89(84%), FVC 3.70 (78%), TLC 5.28 (78%), DLCO 64% mixed obstructive and  restrictive pattern.  Spirometry 03/11/09 FEV 3.35 (97%),FVC 4.10 (86%), FEV% 82. Steroid responsive pneumonitis 04/09- required mechanical ventilation during hospitalization - The following hospital test were negative: PCP,ANA,HIV,BAL. - ACE25,RF20,CRP 28,A1AT 263, ESR 109  . Depression   . Diabetes mellitus without complication ([place])   . Dyslipidemia   . Dyslipidemia   . GERD (gastroesophageal reflux disease)   . History of skin cancer   . Hypertension    Echo 10/09 EF 60%  . ILD (interstitial lung disease) ([place])   . Panic attack    Past Surgical History:  Procedure Laterality Date  . BRONCHOSCOPY  10/09  . Ganglion cyst on foot    . HERNIA REPAIR     2 hernia  . Left shoulder surgery     Social History   Socioeconomic History  . Marital status: Divorced    Spouse name: Not on file  . Number of children: Not on file  . Years of education: Not on file  . Highest education level: Not on file  Occupational History  . Occupation: Control and instrumentation engineer: UNEMPLOYED  Social Needs  . Financial resource strain: Not very hard  . Food insecurity:    Worry: Never true    Inability: Never true  . Transportation needs:    Medical: No    Non-medical: No  Tobacco Use  . Smoking status: Former Smoker    Packs/day: 1.00    Years: 16.00    Pack years: 16.00    Types: Cigarettes    Last attempt to quit: 10/17/2003    Years since quitting: 15.0  . Smokeless tobacco: Never Used  Substance and Sexual Activity  . Alcohol use: No    Comment: Rare beer 3-4 times per year  . Drug use: No  . Sexual activity: Not Currently  Lifestyle  . Physical activity:    Days per week: 0 days    Minutes per session: 0 min  . Stress: Only a little  Relationships  . Social connections:    Talks on phone: More than three times a week    Gets together: More than three times a week    Attends religious service: Not on file    Active member of club or organization: Not on file    Attends meetings of clubs or organizations: Not on file    Relationship status: Divorced  Other Topics Concern  . Not on file  Social History Narrative   Divorced. No Children   Drug use-No   Regular exercise-yes  Lives alone   Allergies  Allergen Reactions  . Latex   . Morphine   . Statins     REACTION: weakness, muscle pains   Family History  Problem Relation Age of Onset  . Heart attack Father 25  . Diabetes Father   . Heart attack Brother   . Emphysema Brother   . Diabetes Brother     Current Outpatient Medications (Endocrine & Metabolic):  .  metFORMIN (GLUCOPHAGE) 1000 MG tablet, TAKE 1 TABLET (1,000 MG TOTAL) BY MOUTH 2 (TWO) TIMES DAILY WITH A MEAL. (Patient taking differently: Take 500 mg by mouth 2 (two) times daily with a meal. )  Current Outpatient Medications (Cardiovascular):  Marland Kitchen  Azilsartan Medoxomil (EDARBI) 40 MG TABS, Take 1 tablet by mouth daily. .  chlorthalidone (HYGROTON) 25 MG tablet, TAKE 1 TABLET BY MOUTH EVERY DAY  Current  Outpatient Medications (Respiratory):  .  fexofenadine (ALLEGRA) 180 MG tablet, Take 180 mg by mouth daily as needed for allergies.  Marland Kitchen  STIOLTO RESPIMAT 2.5-2.5 MCG/ACT AERS, INHALE 2 PUFFS INTO THE LUNGS DAILY.  Current Outpatient Medications (Analgesics):  .  aspirin 81 MG EC tablet, Take 81 mg by mouth daily.     Current Outpatient Medications (Other):  Marland Kitchen  [employer] D3 2000 units CAPS, TAKE 2 CAPSULES (4,000 UNITS TOTAL) BY MOUTH DAILY. .  famotidine (PEPCID) 40 MG tablet, Take 1 tablet (40 mg total) by mouth daily. Marland Kitchen  KLOR-CON M20 20 MEQ tablet, TAKE 1 TABLET (20 MEQ TOTAL) BY MOUTH 2 (TWO) TIMES DAILY. .  NON FORMULARY, Oxygen continuous 1.5 liters with activity  .  ONETOUCH VERIO test strip, USE 1 PER DAY    Past medical history, social, surgical and family history all reviewed in electronic medical record.  No pertanent information unless stated regarding to the chief complaint.   Review of Systems:  No headache, visual changes, nausea, vomiting, diarrhea, constipation, dizziness, abdominal pain, skin rash, fevers, chills, night sweats, weight loss, swollen lymph nodes, body aches, joint swelling, , chest pain, shortness of breath, mood changes.  Positive muscle aches  Objective  Blood pressure 140/90, pulse (!) 114, height _0  (1.702 m), weight 217 lb (98.4 kg), SpO2 97 %.  General: No apparent distress alert and oriented x3 mood and affect normal, dressed appropriately.  HEENT: Pupils equal, extraocular movements intact  Respiratory: Patient's speak in full sentences and does not appear short of breath  Cardiovascular: No lower extremity edema, non tender, no erythema  Skin: Warm dry intact with no signs of infection or rash on extremities or on axial skeleton.  Abdomen: Soft nontender  Neuro: Cranial nerves II through XII are intact, neurovascularly intact in all extremities with 2+ DTRs and 2+ pulses.  Lymph: No lymphadenopathy of posterior or anterior cervical chain or axillae  bilaterally.  Gait normal with good balance and coordination.  MSK:  tender with full range of motion and good stability and symmetric strength and tone of  elbows, wrist, hip, knee and ankles bilaterally.  Mild arthritic changes of multiple joints  Right shoulder exam has 4-5 strength of rotator cuff but near full range of motion lacking last 10 degrees external rotation.  Patient does have mild positive crossover and O'Brien sign as well.  Procedure: Real-time Ultrasound Guided Injection of right glenohumeral joint Device: GE Logiq Q7  Ultrasound guided injection is preferred based studies that show increased duration, increased effect, greater accuracy, decreased procedural pain, increased response rate with ultrasound guided versus blind injection.  Verbal  informed consent obtained.  Time-out conducted.  Noted no overlying erythema, induration, or other signs of local infection.  Skin prepped in a sterile fashion.  Local anesthesia: Topical Ethyl chloride.  With sterile technique and under real time ultrasound guidance:  Joint visualized.  23g 1  inch needle inserted posterior approach. Pictures taken for needle placement. Patient did have injection of 2 cc of 1% lidocaine, 2 cc of 0.5% Marcaine, and 1.0 cc of Kenalog 40 mg/dL. Completed without difficulty  Pain immediately resolved suggesting accurate placement of the medication.  Advised to call if fevers/chills, erythema, induration, drainage, or persistent bleeding.  Images permanently stored and available for review in the ultrasound unit.  Impression: Technically successful ultrasound guided injection.    Impression and Recommendations:     This case required medical decision making of moderate complexity. The above documentation has been reviewed and is accurate and complete Lyndal Pulley, DO       Note: This dictation was prepared with Dragon dictation along with smaller phrase technology. Any transcriptional errors that  result from this process are unintentional.

## 2018-11-14 ENCOUNTER — Ambulatory Visit: Payer: Self-pay

## 2018-11-14 ENCOUNTER — Encounter: Payer: Self-pay | Admitting: Family Medicine

## 2018-11-14 ENCOUNTER — Ambulatory Visit: Payer: Medicare Other | Admitting: Family Medicine

## 2018-11-14 VITALS — BP 140/90 | HR 114 | Ht 67.0 in | Wt 217.0 lb

## 2018-11-14 DIAGNOSIS — M75101 Unspecified rotator cuff tear or rupture of right shoulder, not specified as traumatic: Secondary | ICD-10-CM | POA: Diagnosis not present

## 2018-11-14 DIAGNOSIS — M25511 Pain in right shoulder: Secondary | ICD-10-CM | POA: Diagnosis not present

## 2018-11-14 DIAGNOSIS — G8929 Other chronic pain: Secondary | ICD-10-CM

## 2018-11-14 NOTE — Patient Instructions (Signed)
Good to see you  Ice is your friend Injected the shoulder today  PT will be calling you  Try to keep hands within peripheral vision  See me again in 4-8 weeks

## 2018-11-14 NOTE — Assessment & Plan Note (Signed)
Patient given injection.  Discussed icing regimen and home exercises.  Discussed which activities to do which wants to avoid.  Patient will increase activity slowly.  Start the physical therapy as well.  Follow-up again in 4 weeks

## 2018-11-16 DIAGNOSIS — J188 Other pneumonia, unspecified organism: Secondary | ICD-10-CM | POA: Diagnosis not present

## 2018-11-22 ENCOUNTER — Other Ambulatory Visit: Payer: Self-pay | Admitting: Internal Medicine

## 2018-11-22 DIAGNOSIS — E876 Hypokalemia: Secondary | ICD-10-CM

## 2018-11-22 DIAGNOSIS — I10 Essential (primary) hypertension: Secondary | ICD-10-CM

## 2018-11-26 ENCOUNTER — Encounter: Payer: Self-pay | Admitting: Physical Therapy

## 2018-11-26 ENCOUNTER — Other Ambulatory Visit: Payer: Self-pay

## 2018-11-26 ENCOUNTER — Ambulatory Visit: Payer: Medicare Other | Attending: Family Medicine | Admitting: Physical Therapy

## 2018-11-26 DIAGNOSIS — R252 Cramp and spasm: Secondary | ICD-10-CM | POA: Diagnosis present

## 2018-11-26 DIAGNOSIS — M25511 Pain in right shoulder: Secondary | ICD-10-CM | POA: Diagnosis not present

## 2018-11-26 DIAGNOSIS — M25611 Stiffness of right shoulder, not elsewhere classified: Secondary | ICD-10-CM | POA: Insufficient documentation

## 2018-11-26 NOTE — Patient Instructions (Signed)
Access Code: [email or web address]  URL: [email or web address]  Date: 11/26/2018  Prepared by: Stacie Glaze   Exercises  Standing Shoulder Internal Rotation Stretch Behind Back - 5 reps - 2 sets - 10 hold - 3x daily - 7x weekly  Shrugs

## 2018-11-26 NOTE — Therapy (Signed)
[facility] [place], [place], [address] Phone: [phone]   Fax:  [phone]  Physical Therapy Evaluation  Patient Details  Name: [name] MRN: [id number] Date of Birth: 1955/11/30 Referring Provider (PT): Creig Hines   Encounter Date: 11/26/2018  PT End of Session - 11/26/18 1503    Visit Number  1    Date for PT Re-Evaluation  01/25/19    PT Start Time  1430    PT Stop Time  1520    PT Time Calculation (min)  50 min    Activity Tolerance  Patient tolerated treatment well    Behavior During Therapy  [facility] for tasks assessed/performed       Past Medical History:  Diagnosis Date  . COPD (chronic obstructive pulmonary disease) ([place]) 09/23/08   -PFT 09/23/08 FEV1 2.89(84%), FVC 3.70 (78%), TLC 5.28 (78%), DLCO 64% mixed obstructive and  restrictive pattern.  Spirometry 03/11/09 FEV 3.35 (97%),FVC 4.10 (86%), FEV% 82. Steroid responsive pneumonitis 04/09- required mechanical ventilation during hospitalization - The following hospital test were negative: PCP,ANA,HIV,BAL. - ACE25,RF20,CRP 28,A1AT 263, ESR 109  . Depression   . Diabetes mellitus without complication ([place])   . Dyslipidemia   . Dyslipidemia   . GERD (gastroesophageal reflux disease)   . History of skin cancer   . Hypertension    Echo 10/09 EF 60%  . ILD (interstitial lung disease) ([place])   . Panic attack     Past Surgical History:  Procedure Laterality Date  . BRONCHOSCOPY  10/09  . Ganglion cyst on foot    . HERNIA REPAIR     2 hernia  . Left shoulder surgery      There were no vitals filed for this visit.   Subjective Assessment - 11/26/18 1438    Subjective  Patient reports that last summer he caught his right arm while he was on a riding lawn mower, reports that the arm pulled backwards and has had some pain since that time.      Limitations  Reading;Lifting;House hold activities    Patient Stated Goals  have less pain, better  ROM, be able to do cross stitch    Currently in Pain?  Yes    Pain Score  2     Pain Location  Shoulder    Pain Orientation  Right    Pain Descriptors / Indicators  Aching;Nagging    Pain Type  Acute pain    Pain Onset  More than a month ago    Pain Frequency  Constant    Aggravating Factors   sleeping, reaching behind pain up to 6-7/10    Pain Relieving Factors  at rest, not moving the arm pain can be 1/10    Effect of Pain on Daily Activities  difficulty dressing, sleeping         [facility] PT Assessment - 11/26/18 0001      Assessment   Medical Diagnosis  right shoulder pain with RC tear    Referring Provider (PT)  Z. Smith    Onset Date/Surgical Date  10/26/18    Hand Dominance  Right    Prior Therapy  no      Precautions   Precautions  None    Precaution Comments  on 1.5L O2      Balance Screen   Has the patient fallen in the past 6 months  No    Has the patient had a decrease in activity  level because of a fear of falling?   No    Is the patient reluctant to leave their home because of a fear of falling?   No      Home Environment   Additional Comments  does housework, yardwork, gardening, cross stitch      Prior Function   Level of Independence  Independent    Vocation  Retired;On disability    Vocation Requirements  due to lungs    Leisure  cross stitch      Posture/Postural Control   Posture Comments  fwd head, rounded shoulders      ROM / Strength   AROM / PROM / Strength  AROM;PROM;Strength      AROM   AROM Assessment Site  Shoulder    Right/Left Shoulder  Right    Right Shoulder Flexion  130 Degrees    Right Shoulder ABduction  125 Degrees    Right Shoulder Internal Rotation  50 Degrees    Right Shoulder External Rotation  80 Degrees      PROM   PROM Assessment Site  Shoulder    Right/Left Shoulder  Right    Right Shoulder Flexion  150 Degrees    Right Shoulder ABduction  145 Degrees    Right Shoulder Internal Rotation  58 Degrees    Right  Shoulder External Rotation  90 Degrees      Strength   Overall Strength Comments  right shoulder flexion, abduction and IR 4-/5, ER 4/5, mild pain      Palpation   Palpation comment  he is very tight and tender in the right upper trap with spasms                Objective measurements completed on examination: See above findings.      [facility] PT Treatment/Exercise - 11/26/18 0001      Modalities   Modalities  Electrical Stimulation;Moist Heat      Moist Heat Therapy   Number Minutes Moist Heat  15 Minutes    Moist Heat Location  Shoulder      Electrical Stimulation   Electrical Stimulation Location  right upper trap for pain and spasm    Electrical Stimulation Action  IFC    Electrical Stimulation Parameters  sitting    Electrical Stimulation Goals  Pain               PT Short Term Goals - 11/26/18 1511      PT SHORT TERM GOAL #1   Title  independent with initial HEP    Time  2    Period  Weeks    Status  New        PT Long Term Goals - 11/26/18 1511      PT LONG TERM GOAL #1   Title  understand posture and body mechanics and try to decrease the compensation of the upper trap    Time  8    Period  Weeks    Status  New      PT LONG TERM GOAL #2   Title  report pain decreased 50%    Time  8    Period  Weeks    Status  New      PT LONG TERM GOAL #3   Title  increase IR ROM to 75 degrees    Time  8    Period  Weeks    Status  New      PT LONG TERM GOAL #  4   Title  do cross stitch without pain    Time  8    Period  Weeks    Status  New             Plan - 11/26/18 1504    Clinical Impression Statement  Patient reports that he thinks he injured his right shoulder last summer when it got caught on a tree while on a riding lawn mower, he reports that he lost ROM, was having difficulty sleeping, and reaching especially to get his oxygen tank on his back.  The MD did an [place] and feels that he has an RC tear, he is right handed.  His  ROM is fair biggest limitation is with IR and this is painful, he does have a lot of spasm in the right upper trap from compensatory movement patterns.  He lives in [place] and we may limit the visits as he reports that Dr. Tamala Julian gave him exercises and he feels that his motion is better since starting those.    Clinical Presentation  Evolving    Clinical Decision Making  Low    Rehab Potential  Good    PT Frequency  1x / week    PT Duration  8 weeks    PT Treatment/Interventions  ADLs/Self Care Home Management;Cryotherapy;Electrical Stimulation;Iontophoresis 59m/ml Dexamethasone;Moist Heat;Ultrasound;Therapeutic activities;Therapeutic exercise;Patient/family education;Neuromuscular re-education;Manual techniques;Dry needling    PT Next Visit Plan  may look to see him 1x/week to maximize his ROM, decrease pain with ADL's    Consulted and Agree with Plan of Care  Patient       Patient will benefit from skilled therapeutic intervention in order to improve the following deficits and impairments:  Pain, Improper body mechanics, Cardiopulmonary status limiting activity, Increased muscle spasms, Postural dysfunction, Impaired UE functional use, Decreased strength, Decreased range of motion  Visit Diagnosis: Acute pain of right shoulder - Plan: PT plan of care cert/re-cert  Stiffness of right shoulder, not elsewhere classified - Plan: PT plan of care cert/re-cert  Cramp and spasm - Plan: PT plan of care cert/re-cert     Problem List Patient Active Problem List   Diagnosis Date Noted  . Rotator cuff tear, right 10/14/2018  . Chronic right shoulder pain 09/09/2018  . Vitamin D deficiency disease 06/12/2018  . GERD with esophagitis 12/12/2017  . Thoracic aortic aneurysm without rupture ([place] 12/12/2017  . Hypokalemia 06/11/2017  . Screen for colon cancer 01/12/2016  . Routine general medical examination at a health care facility 07/16/2015  . Benign prostatic hyperplasia 07/13/2014  . Type  II diabetes mellitus with manifestations ([place] 11/12/2013  . Pure hyperglyceridemia 07/09/2013  . Upper airway cough syndrome 02/06/2012  . Hyperlipidemia with target LDL less than 130 07/05/2011  . EMPHYSEMA 12/07/2008  . Essential hypertension, benign 08/26/2008  . INTERSTITIAL LUNG DISEASE 08/26/2008  . Hypoxemia 08/26/2008    ASumner Boast, PT 11/26/2018, 3:15 PM  [address] [address]Phone: [phone]  Fax:  [phone] Name: Eric HERRODMRN: [id number] of Birth: 308-30-1957

## 2018-12-05 ENCOUNTER — Ambulatory Visit: Payer: Medicare Other | Admitting: Physical Therapy

## 2018-12-09 ENCOUNTER — Ambulatory Visit: Payer: Medicare Other | Admitting: Physical Therapy

## 2018-12-12 ENCOUNTER — Other Ambulatory Visit: Payer: Self-pay | Admitting: Internal Medicine

## 2018-12-12 DIAGNOSIS — E118 Type 2 diabetes mellitus with unspecified complications: Secondary | ICD-10-CM

## 2018-12-14 ENCOUNTER — Other Ambulatory Visit: Payer: Self-pay | Admitting: Internal Medicine

## 2018-12-15 DIAGNOSIS — R092 Respiratory arrest: Secondary | ICD-10-CM | POA: Diagnosis not present

## 2018-12-15 DIAGNOSIS — I10 Essential (primary) hypertension: Secondary | ICD-10-CM | POA: Diagnosis not present

## 2018-12-15 DIAGNOSIS — J188 Other pneumonia, unspecified organism: Secondary | ICD-10-CM | POA: Diagnosis not present

## 2018-12-16 ENCOUNTER — Other Ambulatory Visit: Payer: Self-pay | Admitting: Internal Medicine

## 2018-12-16 ENCOUNTER — Encounter: Payer: Self-pay | Admitting: Physical Therapy

## 2018-12-16 ENCOUNTER — Ambulatory Visit: Payer: Medicare Other | Attending: Family Medicine | Admitting: Physical Therapy

## 2018-12-16 DIAGNOSIS — R252 Cramp and spasm: Secondary | ICD-10-CM | POA: Diagnosis not present

## 2018-12-16 DIAGNOSIS — M25511 Pain in right shoulder: Secondary | ICD-10-CM | POA: Insufficient documentation

## 2018-12-16 DIAGNOSIS — I712 Thoracic aortic aneurysm, without rupture, unspecified: Secondary | ICD-10-CM

## 2018-12-16 DIAGNOSIS — M25611 Stiffness of right shoulder, not elsewhere classified: Secondary | ICD-10-CM | POA: Diagnosis not present

## 2018-12-16 DIAGNOSIS — Z01812 Encounter for preprocedural laboratory examination: Secondary | ICD-10-CM

## 2018-12-16 NOTE — Therapy (Signed)
[name], [place], [address] Phone: [phone]   Fax:  [phone]  Physical Therapy Treatment  Patient Details  Name: [name] MRN: [id number] Date of Birth: 1955/11/01 Referring Provider (PT): Creig Hines   Encounter Date: 12/16/2018  PT End of Session - 12/16/18 1322    Visit Number  2    Date for PT Re-Evaluation  01/25/19    PT Start Time  1240    PT Stop Time  1340    PT Time Calculation (min)  60 min    Activity Tolerance  Patient tolerated treatment well    Behavior During Therapy  [facility] for tasks assessed/performed       Past Medical History:  Diagnosis Date  . COPD (chronic obstructive pulmonary disease) ([place]) 09/23/08   -PFT 09/23/08 FEV1 2.89(84%), FVC 3.70 (78%), TLC 5.28 (78%), DLCO 64% mixed obstructive and  restrictive pattern.  Spirometry 03/11/09 FEV 3.35 (97%),FVC 4.10 (86%), FEV% 82. Steroid responsive pneumonitis 04/09- required mechanical ventilation during hospitalization - The following hospital test were negative: PCP,ANA,HIV,BAL. - ACE25,RF20,CRP 28,A1AT 263, ESR 109  . Depression   . Diabetes mellitus without complication ([place])   . Dyslipidemia   . Dyslipidemia   . GERD (gastroesophageal reflux disease)   . History of skin cancer   . Hypertension    Echo 10/09 EF 60%  . ILD (interstitial lung disease) ([place])   . Panic attack     Past Surgical History:  Procedure Laterality Date  . BRONCHOSCOPY  10/09  . Ganglion cyst on foot    . HERNIA REPAIR     2 hernia  . Left shoulder surgery      There were no vitals filed for this visit.  Subjective Assessment - 12/16/18 1253    Subjective  Patient reports that he feels like the exercise I gave him helped the ROM, reports some pain wakes him up.    Currently in Pain?  Yes    Pain Score  2     Pain Location  Shoulder    Pain Orientation  Right    Aggravating Factors   reaching behind    Pain Relieving Factors   not moving it         [facility] PT Assessment - 12/16/18 0001      AROM   Right Shoulder Flexion  142 Degrees    Right Shoulder ABduction  134 Degrees    Right Shoulder Internal Rotation  60 Degrees    Right Shoulder External Rotation  83 Degrees                   [facility] Adult PT Treatment/Exercise - 12/16/18 0001      Exercises   Exercises  Shoulder      Shoulder Exercises: Seated   External Rotation  Both;20 reps;Theraband    Theraband Level (Shoulder External Rotation)  Level 2 (Red)      Shoulder Exercises: ROM/Strengthening   UBE (Upper Arm Bike)  level 2 x 4 minutes    Lat Pull  2 plate;20 reps    Cybex Press  1 plate;20 reps    Cybex Row  2 plate;20 reps    "W" Arms  20 reps      Modalities   Modalities  Electrical Stimulation;Moist Heat      Moist Heat Therapy   Number Minutes Moist Heat  15 Minutes    Moist Heat Location  Shoulder  Acupuncturist Location  right upper trap for pain and spasm    Electrical Stimulation Action  IFC    Electrical Stimulation Parameters  sitting    Electrical Stimulation Goals  Pain      Manual Therapy   Manual Therapy  Passive ROM    Passive ROM  all GH joint motions               PT Short Term Goals - 12/16/18 1324      PT SHORT TERM GOAL #1   Title  independent with initial HEP    Status  Achieved        PT Long Term Goals - 12/16/18 1324      PT LONG TERM GOAL #1   Title  understand posture and body mechanics and try to decrease the compensation of the upper trap    Status  On-going      PT LONG TERM GOAL #2   Title  report pain decreased 50%    Status  Partially Met      PT LONG TERM GOAL #3   Title  increase IR ROM to 75 degrees    Status  Partially Met      PT LONG TERM GOAL #4   Title  do cross stitch without pain    Status  On-going            Plan - 12/16/18 1323    Clinical Impression Statement  Patient reports that the exercise I  gave him to help with the IR has really helped and he is reporting overall less pain and better ROM.  The ROM is much improved, he does have pain in the shoulder and some tightness in the cervical and upper traps.    PT Next Visit Plan  due to the distance he has to travel to get to PT we have been seeing him every other week.  He is to see the MD next week    Consulted and Agree with Plan of Care  Patient       Patient will benefit from skilled therapeutic intervention in order to improve the following deficits and impairments:  Pain, Improper body mechanics, Cardiopulmonary status limiting activity, Increased muscle spasms, Postural dysfunction, Impaired UE functional use, Decreased strength, Decreased range of motion  Visit Diagnosis: Acute pain of right shoulder  Stiffness of right shoulder, not elsewhere classified  Cramp and spasm     Problem List Patient Active Problem List   Diagnosis Date Noted  . Rotator cuff tear, right 10/14/2018  . Chronic right shoulder pain 09/09/2018  . Vitamin D deficiency disease 06/12/2018  . GERD with esophagitis 12/12/2017  . Thoracic aortic aneurysm without rupture ([place]) 12/12/2017  . Hypokalemia 06/11/2017  . Screen for colon cancer 01/12/2016  . Routine general medical examination at a health care facility 07/16/2015  . Benign prostatic hyperplasia 07/13/2014  . Type II diabetes mellitus with manifestations ([place]) 11/12/2013  . Pure hyperglyceridemia 07/09/2013  . Upper airway cough syndrome 02/06/2012  . Hyperlipidemia with target LDL less than 130 07/05/2011  . EMPHYSEMA 12/07/2008  . Essential hypertension, benign 08/26/2008  . INTERSTITIAL LUNG DISEASE 08/26/2008  . Hypoxemia 08/26/2008    Eric Boast., PT 12/16/2018, 1:25 PM  [place] [address] [place], [place], [address] Phone: [phone]   Fax:  [phone]  Name: [name] MRN: [id number] Date  of Birth: 12-18-1955

## 2018-12-25 NOTE — Progress Notes (Addendum)
Subjective:   [name] is a 63 y.o. male who presents for Medicare Annual/Subsequent preventive examination.  Review of Systems:  No ROS.  Medicare Wellness Visit. Additional risk factors are reflected in the social history.  Cardiac Risk Factors include: advanced age (>21mn, >>53women);diabetes mellitus;dyslipidemia;male gender;hypertension Sleep patterns: has frequent nighttime awakenings, gets up 2 times nightly to void and sleep hours vries nightly. Patient reports insomnia issues, discussed recommended sleep tips. Relevant patient education assigned to patient using Emmi.  Home Safety/Smoke Alarms: Feels safe in home. Smoke alarms in place.  Living environment; residence and Firearm Safety: 1-story house/ trailer, continous oxygen 1.5 L [place]. Lives alone, no needs for DME, limited support system Seat Belt Safety/Bike Helmet: Wears seat belt.   PSA-  Lab Results  Component Value Date   PSA 0.62 11/08/2016   PSA 0.85 07/15/2015   PSA 0.57 07/13/2014       Objective:    Vitals: BP (!) 120/92   Pulse (!) 52   Ht _0  (1.702 m)   Wt 216 lb (98 kg)   SpO2 98% Comment: 1.5 L [place]  BMI 33.83 kg/m   Body mass index is 33.83 kg/m.  Advanced Directives 12/26/2018 11/26/2018 12/12/2017 11/12/2016 02/04/2016 07/16/2015 02/24/2015  Does Patient Have a Medical Advance Directive? No No No Yes Yes;No Yes No  Type of Advance Directive - - -Public librarianLiving will - [place] will -  Does patient want to make changes to medical advance directive? - - - - - No - Patient declined -  Copy of [place]? - - - Yes - Yes -  Would patient like information on creating a medical advance directive? Yes (ED - Information included in AVS) No - Patient declined Yes (ED - Information included in AVS) - Yes - Educational materials given - -    Tobacco Social History   Tobacco Use  Smoking Status Former Smoker  . Packs/day:  1.00  . Years: 16.00  . Pack years: 16.00  . Types: Cigarettes  . Last attempt to quit: 10/17/2003  . Years since quitting: 15.2  Smokeless Tobacco Never Used     Counseling given: Not Answered  Past Medical History:  Diagnosis Date  . COPD (chronic obstructive pulmonary disease) ([place] 09/23/08   -PFT 09/23/08 FEV1 2.89(84%), FVC 3.70 (78%), TLC 5.28 (78%), DLCO 64% mixed obstructive and  restrictive pattern.  Spirometry 03/11/09 FEV 3.35 (97%),FVC 4.10 (86%), FEV% 82. Steroid responsive pneumonitis 04/09- required mechanical ventilation during hospitalization - The following hospital test were negative: PCP,ANA,HIV,BAL. - ACE25,RF20,CRP 28,A1AT 263, ESR 109  . Depression   . Diabetes mellitus without complication ([place]   . Dyslipidemia   . Dyslipidemia   . GERD (gastroesophageal reflux disease)   . History of skin cancer   . Hypertension    Echo 10/09 EF 60%  . ILD (interstitial lung disease) ([place]   . Panic attack    Past Surgical History:  Procedure Laterality Date  . BRONCHOSCOPY  10/09  . Ganglion cyst on foot    . HERNIA REPAIR     2 hernia  . Left shoulder surgery     Family History  Problem Relation Age of Onset  . Heart attack Father 530 . Diabetes Father   . Heart attack Brother   . Emphysema Brother   . Diabetes Brother    Social History   Socioeconomic History  . Marital status: Divorced  Spouse name: Not on file  . Number of children: 0  . Years of education: Not on file  . Highest education level: Not on file  Occupational History  . Occupation: Scientist, water quality: UNEMPLOYED  Social Needs  . Financial resource strain: Somewhat hard  . Food insecurity:    Worry: Never true    Inability: Never true  . Transportation needs:    Medical: No    Non-medical: No  Tobacco Use  . Smoking status: Former Smoker    Packs/day: 1.00    Years: 16.00    Pack years: 16.00    Types: Cigarettes    Last attempt to quit: 10/17/2003    Years since  quitting: 15.2  . Smokeless tobacco: Never Used  Substance and Sexual Activity  . Alcohol use: No    Comment: Rare beer 3-4 times per year  . Drug use: No  . Sexual activity: Not Currently  Lifestyle  . Physical activity:    Days per week: 0 days    Minutes per session: 0 min  . Stress: To some extent  Relationships  . Social connections:    Talks on phone: More than three times a week    Gets together: Once a week    Attends religious service: Not on file    Active member of club or organization: Not on file    Attends meetings of clubs or organizations: Not on file    Relationship status: Divorced  Other Topics Concern  . Not on file  Social History Narrative   Divorced. No Children   Drug use-No   Regular exercise-yes   Lives alone    Outpatient Encounter Medications as of 12/26/2018  Medication Sig  . aspirin 81 MG EC tablet Take 81 mg by mouth daily.    . Azilsartan Medoxomil (EDARBI) 40 MG TABS Take 1 tablet by mouth daily.  . chlorthalidone (HYGROTON) 25 MG tablet TAKE 1 TABLET BY MOUTH EVERY DAY  . [facility] D3 50 MCG (2000 UT) CAPS TAKE 2 CAPSULES (4,000 UNITS TOTAL) BY MOUTH DAILY.  . famotidine (PEPCID) 40 MG tablet Take 1 tablet (40 mg total) by mouth daily.  . fexofenadine (ALLEGRA) 180 MG tablet Take 180 mg by mouth daily as needed for allergies.   . metFORMIN (GLUCOPHAGE) 1000 MG tablet TAKE 1 TABLET (1,000 MG TOTAL) BY MOUTH 2 (TWO) TIMES DAILY WITH A MEAL.  . NON FORMULARY Oxygen continuous 1.5 liters with activity   . ONETOUCH VERIO test strip USE 1 PER DAY  . potassium chloride SA (KLOR-CON M20) 20 MEQ tablet Take 1 tablet (20 mEq total) by mouth 2 (two) times daily.  Marland Kitchen STIOLTO RESPIMAT 2.5-2.5 MCG/ACT AERS INHALE 2 PUFFS INTO THE LUNGS DAILY.   No facility-administered encounter medications on file as of 12/26/2018.     Activities of Daily Living In your present state of health, do you have any difficulty performing the following activities: 12/26/2018   Hearing? N  Vision? N  Difficulty concentrating or making decisions? N  Walking or climbing stairs? N  Dressing or bathing? N  Doing errands, shopping? N  Preparing Food and eating ? N  Using the Toilet? N  In the past six months, have you accidently leaked urine? N  Do you have problems with loss of bowel control? N  Managing your Medications? N  Managing your Finances? N  Housekeeping or managing your Housekeeping? N  Some recent data might be hidden    Patient  Care Team: [name], MD as PCP - General Chesley Mires, MD as Consulting Physician (Pulmonary Disease) End, Harrell Gave, MD as Consulting Physician (Cardiology)   Assessment:   This is a routine wellness examination for [name]. Physical assessment deferred to PCP.  Exercise Activities and Dietary recommendations Current Exercise Habits: The patient does not participate in regular exercise at present, Exercise limited by: respiratory conditions(s)  Diet (meal preparation, eat out, water intake, caffeinated beverages, dairy products, fruits and vegetables): in general, a "healthy" diet     Reviewed heart healthy and diabetic diet. Encouraged patient to increase daily water and healthy fluid intake.  Goals    . Patient Stated     Increase my physical exercise by walking at [employer], [employer], and other places. Enjoy planting my garden and humming birds.        Fall Risk Fall Risk  12/26/2018 12/14/2017 12/12/2017 11/12/2016 02/04/2016  Falls in the past year? 0 No No No No  Number falls in past yr: 0 - - - -  Risk for fall due to : Impaired balance/gait - - - -  Follow up Falls prevention discussed - - - -    Depression Screen PHQ 2/9 Scores 12/26/2018 12/12/2017 11/12/2016 02/04/2016  PHQ - 2 Score 3 2 0 0  PHQ- 9 Score 7 6 - -    Cognitive Function       Ad8 score reviewed for issues:  Issues making decisions: no  Less interest in hobbies / activities: no  Repeats questions, stories (family complaining):  no  Trouble using ordinary gadgets (microwave, computer, phone):no  Forgets the month or year: no  Mismanaging finances: no  Remembering appts: no  Daily problems with thinking and/or memory: no Ad8 score is= 0  Immunization History  Administered Date(s) Administered  . Influenza Split 07/05/2011, 07/16/2012  . Influenza Whole 07/21/2009, 08/22/2010  . Influenza,inj,Quad PF,6+ Mos 06/25/2013, 07/13/2014, 07/15/2015, 08/08/2016, 06/11/2017, 06/12/2018  . Pneumococcal Conjugate-13 06/25/2013  . Pneumococcal Polysaccharide-23 08/17/2008, 08/08/2016  . Td 10/16/2005  . Tdap 01/12/2016  . Zoster 05/08/2016   Screening Tests Health Maintenance  Topic Date Due  . COLONOSCOPY  02/14/2017  . HEMOGLOBIN A1C  03/10/2019  . FOOT EXAM  06/13/2019  . OPHTHALMOLOGY EXAM  08/07/2019  . PNA vac Low Risk Adult (2 of 2 - PPSV23) 08/08/2021  . TETANUS/TDAP  01/11/2026  . INFLUENZA VACCINE  Completed  . Hepatitis C Screening  Completed  . HIV Screening  Completed       Plan:     Reviewed health maintenance screenings with patient today and relevant education, vaccines, and/or referrals were provided.   Continue doing brain stimulating activities (puzzles, reading, adult coloring books, staying active) to keep memory sharp.   Continue to eat heart healthy diet (full of fruits, vegetables, whole grains, lean protein, water--limit salt, fat, and sugar intake) and increase physical activity as tolerated.  I have personally reviewed and noted the following in the patient's chart:   . Medical and social history . Use of alcohol, tobacco or illicit drugs  . Current medications and supplements . Functional ability and status . Nutritional status . Physical activity . Advanced directives . List of other physicians . Vitals . Screenings to include cognitive, depression, and falls . Referrals and appointments  In addition, I have reviewed and discussed with patient certain preventive  protocols, quality metrics, and best practice recommendations. A written personalized care plan for preventive services as well as general preventive health recommendations were provided  to patient.     Michiel Cowboy, RN  12/26/2018  Medical screening examination/treatment/procedure(s) were performed by non-physician practitioner and as supervising physician I was immediately available for consultation/collaboration. I agree with above. Scarlette Calico, MD

## 2018-12-26 ENCOUNTER — Other Ambulatory Visit: Payer: Self-pay | Admitting: Internal Medicine

## 2018-12-26 ENCOUNTER — Encounter: Payer: Self-pay | Admitting: Family Medicine

## 2018-12-26 ENCOUNTER — Other Ambulatory Visit: Payer: Self-pay

## 2018-12-26 ENCOUNTER — Ambulatory Visit: Payer: Medicare Other | Admitting: Family Medicine

## 2018-12-26 ENCOUNTER — Other Ambulatory Visit (INDEPENDENT_AMBULATORY_CARE_PROVIDER_SITE_OTHER): Payer: Medicare Other

## 2018-12-26 ENCOUNTER — Ambulatory Visit (INDEPENDENT_AMBULATORY_CARE_PROVIDER_SITE_OTHER): Payer: Medicare Other | Admitting: *Deleted

## 2018-12-26 VITALS — BP 120/92 | HR 52 | Ht 67.0 in | Wt 216.0 lb

## 2018-12-26 DIAGNOSIS — M75101 Unspecified rotator cuff tear or rupture of right shoulder, not specified as traumatic: Secondary | ICD-10-CM

## 2018-12-26 DIAGNOSIS — Z01812 Encounter for preprocedural laboratory examination: Secondary | ICD-10-CM | POA: Diagnosis not present

## 2018-12-26 DIAGNOSIS — Z0000 Encounter for general adult medical examination without abnormal findings: Secondary | ICD-10-CM | POA: Diagnosis not present

## 2018-12-26 LAB — BASIC METABOLIC PANEL
BUN: 16 mg/dL (ref 6–23)
CALCIUM: 9.9 mg/dL (ref 8.4–10.5)
CO2: 28 meq/L (ref 19–32)
Chloride: 95 mEq/L — ABNORMAL LOW (ref 96–112)
Creatinine, Ser: 0.96 mg/dL (ref 0.40–1.50)
GFR: 78.61 mL/min (ref >60.00)
Glucose, Bld: 132 mg/dL — ABNORMAL HIGH (ref 70–99)
Potassium: 4 mEq/L (ref 3.5–5.1)
Sodium: 131 mEq/L — ABNORMAL LOW (ref 135–145)

## 2018-12-26 NOTE — Patient Instructions (Addendum)
Continue doing brain stimulating activities (puzzles, reading, adult coloring books, staying active) to keep memory sharp.   Continue to eat heart healthy diet (full of fruits, vegetables, whole grains, lean protein, water--limit salt, fat, and sugar intake) and increase physical activity as tolerated.  If you or someone you know has experienced the death of a loved one, the support of others can play an invaluable role in the healing process. [facility] offers grief and loss services for anyone in the community. Contact us today [phone]     Preventive Care 65 Years and Older, Male Preventive care refers to lifestyle choices and visits with your health care provider that can promote health and wellness. What does preventive care include?   A yearly physical exam. This is also called an annual well check.  Dental exams once or twice a year.  Routine eye exams. Ask your health care provider how often you should have your eyes checked.  Personal lifestyle choices, including: ? Daily care of your teeth and gums. ? Regular physical activity. ? Eating a healthy diet. ? Avoiding tobacco and drug use. ? Limiting alcohol use. ? Practicing safe sex. ? Taking low doses of aspirin every day. ? Taking vitamin and mineral supplements as recommended by your health care provider. What happens during an annual well check? The services and screenings done by your health care provider during your annual well check will depend on your age, overall health, lifestyle risk factors, and family history of disease. Counseling Your health care provider may ask you questions about your:  Alcohol use.  Tobacco use.  Drug use.  Emotional well-being.  Home and relationship well-being.  Sexual activity.  Eating habits.  History of falls.  Memory and ability to understand (cognition).  Work and work Statistician. Screening You may have the following tests or  measurements:  Height, weight, and BMI.  Blood pressure.  Lipid and cholesterol levels. These may be checked every 5 years, or more frequently if you are over 36 years old.  Skin check.  Lung cancer screening. You may have this screening every year starting at age 27 if you have a 30-pack-year history of smoking and currently smoke or have quit within the past 15 years.  Colorectal cancer screening. All adults should have this screening starting at age 22 and continuing until age 76. You will have tests every 1-10 years, depending on your results and the type of screening test. People at increased risk should start screening at an earlier age. Screening tests may include: ? Guaiac-based fecal occult blood testing. ? Fecal immunochemical test (FIT). ? Stool DNA test. ? Virtual colonoscopy. ? Sigmoidoscopy. During this test, a flexible tube with a tiny camera (sigmoidoscope) is used to examine your rectum and lower colon. The sigmoidoscope is inserted through your anus into your rectum and lower colon. ? Colonoscopy. During this test, a long, thin, flexible tube with a tiny camera (colonoscope) is used to examine your entire colon and rectum.  Prostate cancer screening. Recommendations will vary depending on your family history and other risks.  Hepatitis C blood test.  Hepatitis B blood test.  Sexually transmitted disease (STD) testing.  Diabetes screening. This is done by checking your blood sugar (glucose) after you have not eaten for a while (fasting). You may have this done every 1-3 years.  Abdominal aortic aneurysm (AAA) screening. You may need this if you are a current or former smoker.  Osteoporosis. You may be screened starting at  age 15 if you are at high risk. Talk with your health care provider about your test results, treatment options, and if necessary, the need for more tests. Vaccines Your health care provider may recommend certain vaccines, such as:  Influenza  vaccine. This is recommended every year.  Tetanus, diphtheria, and acellular pertussis (Tdap, Td) vaccine. You may need a Td booster every 10 years.  Varicella vaccine. You may need this if you have not been vaccinated.  Zoster vaccine. You may need this after age 73.  Measles, mumps, and rubella (MMR) vaccine. You may need at least one dose of MMR if you were born in 1957 or later. You may also need a second dose.  Pneumococcal 13-valent conjugate (PCV13) vaccine. One dose is recommended after age 52.  Pneumococcal polysaccharide (PPSV23) vaccine. One dose is recommended after age 26.  Meningococcal vaccine. You may need this if you have certain conditions.  Hepatitis A vaccine. You may need this if you have certain conditions or if you travel or work in places where you may be exposed to hepatitis A.  Hepatitis B vaccine. You may need this if you have certain conditions or if you travel or work in places where you may be exposed to hepatitis B.  Haemophilus influenzae type b (Hib) vaccine. You may need this if you have certain risk factors. Talk to your health care provider about which screenings and vaccines you need and how often you need them. This information is not intended to replace advice given to you by your health care provider. Make sure you discuss any questions you have with your health care provider. Document Released: 10/29/2015 Document Revised: 11/22/2017 Document Reviewed: 08/03/2015 Elsevier Interactive Patient Education  2019 [employer].

## 2018-12-26 NOTE — Progress Notes (Signed)
[name] [address] [place], [place] [address] Phone: [phone] Subjective:   I [name] am serving as a Education administrator for Dr. Hulan Saas.  I'm seeing this patient by the request  of:    CC: Right shoulder pain follow-up  QIO:[id number]  [name] is a 63 y.o. male coming in with complaint of right shoulder pain. PT and exercising. Feeling better. Shoulder is only sore in the morning.  Patient at last exam was also given an injection.  Doing much better.  States 95% better.  Nothing that stopping him from activities at the moment.    Past Medical History:  Diagnosis Date  . COPD (chronic obstructive pulmonary disease) ([place]) 09/23/08   -PFT 09/23/08 FEV1 2.89(84%), FVC 3.70 (78%), TLC 5.28 (78%), DLCO 64% mixed obstructive and  restrictive pattern.  Spirometry 03/11/09 FEV 3.35 (97%),FVC 4.10 (86%), FEV% 82. Steroid responsive pneumonitis 04/09- required mechanical ventilation during hospitalization - The following hospital test were negative: PCP,ANA,HIV,BAL. - ACE25,RF20,CRP 28,A1AT 263, ESR 109  . Depression   . Diabetes mellitus without complication ([place])   . Dyslipidemia   . Dyslipidemia   . GERD (gastroesophageal reflux disease)   . History of skin cancer   . Hypertension    Echo 10/09 EF 60%  . ILD (interstitial lung disease) ([place])   . Panic attack    Past Surgical History:  Procedure Laterality Date  . BRONCHOSCOPY  10/09  . Ganglion cyst on foot    . HERNIA REPAIR     2 hernia  . Left shoulder surgery     Social History   Socioeconomic History  . Marital status: Divorced    Spouse name: Not on file  . Number of children: Not on file  . Years of education: Not on file  . Highest education level: Not on file  Occupational History  . Occupation: Scientist, water quality: UNEMPLOYED  Social Needs  . Financial resource strain: Not very hard  . Food insecurity:    Worry: Never true    Inability: Never true  .  Transportation needs:    Medical: No    Non-medical: No  Tobacco Use  . Smoking status: Former Smoker    Packs/day: 1.00    Years: 16.00    Pack years: 16.00    Types: Cigarettes    Last attempt to quit: 10/17/2003    Years since quitting: 15.2  . Smokeless tobacco: Never Used  Substance and Sexual Activity  . Alcohol use: No    Comment: Rare beer 3-4 times per year  . Drug use: No  . Sexual activity: Not Currently  Lifestyle  . Physical activity:    Days per week: 0 days    Minutes per session: 0 min  . Stress: Only a little  Relationships  . Social connections:    Talks on phone: More than three times a week    Gets together: More than three times a week    Attends religious service: Not on file    Active member of club or organization: Not on file    Attends meetings of clubs or organizations: Not on file    Relationship status: Divorced  Other Topics Concern  . Not on file  Social History Narrative   Divorced. No Children   Drug use-No   Regular exercise-yes   Lives alone   Allergies  Allergen Reactions  . Latex   . Morphine   . Statins  REACTION: weakness, muscle pains   Family History  Problem Relation Age of Onset  . Heart attack Father 54  . Diabetes Father   . Heart attack Brother   . Emphysema Brother   . Diabetes Brother     Current Outpatient Medications (Endocrine & Metabolic):  .  metFORMIN (GLUCOPHAGE) 1000 MG tablet, TAKE 1 TABLET (1,000 MG TOTAL) BY MOUTH 2 (TWO) TIMES DAILY WITH A MEAL.  Current Outpatient Medications (Cardiovascular):  Marland Kitchen  Azilsartan Medoxomil (EDARBI) 40 MG TABS, Take 1 tablet by mouth daily. .  chlorthalidone (HYGROTON) 25 MG tablet, TAKE 1 TABLET BY MOUTH EVERY DAY  Current Outpatient Medications (Respiratory):  .  fexofenadine (ALLEGRA) 180 MG tablet, Take 180 mg by mouth daily as needed for allergies.  Marland Kitchen  STIOLTO RESPIMAT 2.5-2.5 MCG/ACT AERS, INHALE 2 PUFFS INTO THE LUNGS DAILY.  Current Outpatient Medications  (Analgesics):  .  aspirin 81 MG EC tablet, Take 81 mg by mouth daily.     Current Outpatient Medications (Other):  Marland Kitchen  CVS D3 50 MCG (2000 UT) CAPS, TAKE 2 CAPSULES (4,000 UNITS TOTAL) BY MOUTH DAILY. .  famotidine (PEPCID) 40 MG tablet, Take 1 tablet (40 mg total) by mouth daily. .  NON FORMULARY, Oxygen continuous 1.5 liters with activity  .  ONETOUCH VERIO test strip, USE 1 PER DAY .  potassium chloride SA (KLOR-CON M20) 20 MEQ tablet, Take 1 tablet (20 mEq total) by mouth 2 (two) times daily.    Past medical history, social, surgical and family history all reviewed in electronic medical record.  No pertanent information unless stated regarding to the chief complaint.   Review of Systems:  No headache, visual changes, nausea, vomiting, diarrhea, constipation, dizziness, abdominal pain, skin rash, fevers, chills, night sweats, weight loss, swollen lymph nodes, body aches, joint swelling,chest pain, shortness of breath, mood changes.  Positive muscle aches  Objective  Blood pressure (!) 120/92, pulse (!) 52, height 5' 7" (1.702 m), weight 216 lb (98 kg), SpO2 98 %.   General: No apparent distress alert and oriented x3 mood and affect normal, dressed appropriately.  HEENT: Pupils equal, extraocular movements intact  Respiratory: Patient's speak in full sentences and does not appear short of breath though is wearing oxygen with a nasal cannula Cardiovascular: Paced lower extremity edema, non tender, no erythema  Skin: Warm dry intact with no signs of infection or rash on extremities or on axial skeleton.  Abdomen: Soft nontender  Neuro: Cranial nerves II through XII are intact, neurovascularly intact in all extremities with 2+ DTRs and 2+ pulses.  Lymph: No lymphadenopathy of posterior or anterior cervical chain or axillae bilaterally.  Gait mild antalgic MSK: Arthritic changes of multiple joints.  Able to move the shoulder without any significant problems.  Near full range of motion.   Very mild positive impingement.    Impression and Recommendations:     . The above documentation has been reviewed and is accurate and complete Lyndal Pulley, DO       Note: This dictation was prepared with Dragon dictation along with smaller phrase technology. Any transcriptional errors that result from this process are unintentional.

## 2018-12-26 NOTE — Assessment & Plan Note (Signed)
Patient is doing great with physical therapy and 1 injection.  Strength is significantly from previous exam.  I believe patient will do very well with conservative therapy even though patient does have multiple comorbidities.  Patient will follow-up with me again in 2 months to make sure it fully resolved at that time

## 2018-12-27 ENCOUNTER — Other Ambulatory Visit: Payer: Self-pay | Admitting: Internal Medicine

## 2018-12-27 DIAGNOSIS — I10 Essential (primary) hypertension: Secondary | ICD-10-CM

## 2018-12-30 ENCOUNTER — Other Ambulatory Visit: Payer: Self-pay | Admitting: Internal Medicine

## 2018-12-30 ENCOUNTER — Telehealth: Payer: Self-pay | Admitting: Internal Medicine

## 2018-12-30 DIAGNOSIS — I10 Essential (primary) hypertension: Secondary | ICD-10-CM

## 2018-12-30 MED ORDER — HYDROCHLOROTHIAZIDE 12.5 MG PO CAPS: 12.5000 mg | ORAL_CAPSULE | Freq: Every day | ORAL | 0 refills | Status: DC

## 2018-12-30 NOTE — Telephone Encounter (Signed)
The labs that were done on him last week showed that his sodium and chloride were getting too low. I therefore want him to stop taking chlorthalidone because I think it is too strong for him. I instead want him to change to a different diuretic called hydrochlorothiazide 12.5 mg once a day. Please come back in 4 to 6 weeks for me to recheck your blood pressure and your electrolytes RX sent

## 2018-12-30 NOTE — Telephone Encounter (Signed)
Pt informed of results and medication change. Pt stated understanding.

## 2018-12-30 NOTE — Telephone Encounter (Signed)
Copied from CRM [id number]. Topic: Quick Communication - Rx Refill/Question >> Dec 30, 2018 10:08 AM Zada Girt, Lumin L wrote: Medication: chlorthalidone (HYGROTON) 25 MG tablet (azilsartan was too expensive)  Has the patient contacted their pharmacy? Yes.   (Agent: If no, request that the patient contact the pharmacy for the refill.) (Agent: If yes, when and what did the pharmacy advise?)  Preferred Pharmacy (with phone number or street name): [id number] [place], [place] - [address] [place] [address] Phone: [phone] Fax: [phone]  Agent: Please be advised that RX refills may take up to 3 business days. We ask that you follow-up with your pharmacy.

## 2019-01-08 ENCOUNTER — Inpatient Hospital Stay: Admission: RE | Admit: 2019-01-08 | Payer: Medicare Other | Source: Ambulatory Visit

## 2019-01-14 ENCOUNTER — Other Ambulatory Visit: Payer: Self-pay | Admitting: Internal Medicine

## 2019-01-14 ENCOUNTER — Telehealth: Payer: Self-pay | Admitting: Internal Medicine

## 2019-01-14 DIAGNOSIS — I10 Essential (primary) hypertension: Secondary | ICD-10-CM

## 2019-01-14 DIAGNOSIS — E118 Type 2 diabetes mellitus with unspecified complications: Secondary | ICD-10-CM

## 2019-01-14 MED ORDER — OLMESARTAN MEDOXOMIL 40 MG PO TABS: 40.0000 mg | ORAL_TABLET | Freq: Every day | ORAL | 1 refills | Status: DC

## 2019-01-14 MED ORDER — AZILSARTAN MEDOXOMIL 80 MG PO TABS: 1.0000 | ORAL_TABLET | Freq: Every day | ORAL | 1 refills | Status: DC

## 2019-01-14 NOTE — Telephone Encounter (Signed)
Routing to dr jones---patient has not been taking [place], the samples you gave him the last time are all gone and when he went to pharmacy to pick up prescription, it was too expensive---patient is requesting some other kind of blood pressure medicine that is more affordable [facility]----currently patient is only taking HCTZ 12.5mg  tablet and his readings are about 140-150's systolic, 110's diastolic----routing to dr Yetta Barre, please advise, I will call patient back, thanks

## 2019-01-14 NOTE — Telephone Encounter (Signed)
Please advise, I will call patient back, thanks 

## 2019-01-14 NOTE — Telephone Encounter (Signed)
I recommend that he increase the Edarbi from 40 mg a day  to 80 mg a day.  He can take 2 of the 40 mg tablets he has now. When that prescription runs out I have sent a prescription to his pharmacy for the Edarbi 80 mg dosage.

## 2019-01-14 NOTE — Telephone Encounter (Signed)
Patient advised rx has been sent to [facility]

## 2019-01-14 NOTE — Telephone Encounter (Signed)
RX for generic alternative sent in

## 2019-01-14 NOTE — Telephone Encounter (Signed)
Copied from CRM [id number]. Topic: Quick Communication - Rx Refill/Question >> Jan 14, 2019 10:23 AM Baldo Daub L wrote: Medication:  Blood pressure medication issue  Pt called and left message on [facility] mailbox 01/13/2019- states that recently his blood pressure medication was changed and since then his blood pressure has been running higher than it ever has before.  Pt would like to speak with someone about this to see if dosage needs to increase or if he needs to go back to old medication. Pt can be reached at [phone]

## 2019-01-15 DIAGNOSIS — I10 Essential (primary) hypertension: Secondary | ICD-10-CM | POA: Diagnosis not present

## 2019-01-15 DIAGNOSIS — R092 Respiratory arrest: Secondary | ICD-10-CM | POA: Diagnosis not present

## 2019-01-15 DIAGNOSIS — J188 Other pneumonia, unspecified organism: Secondary | ICD-10-CM | POA: Diagnosis not present

## 2019-02-14 DIAGNOSIS — J188 Other pneumonia, unspecified organism: Secondary | ICD-10-CM | POA: Diagnosis not present

## 2019-02-14 DIAGNOSIS — I10 Essential (primary) hypertension: Secondary | ICD-10-CM | POA: Diagnosis not present

## 2019-02-14 DIAGNOSIS — R0902 Hypoxemia: Secondary | ICD-10-CM | POA: Diagnosis not present

## 2019-02-26 ENCOUNTER — Telehealth: Payer: Self-pay

## 2019-02-26 DIAGNOSIS — I10 Essential (primary) hypertension: Secondary | ICD-10-CM

## 2019-02-26 NOTE — Telephone Encounter (Signed)
-----   Message from Lysle Rubens sent at 02/26/2019  2:46 PM EDT ----- [place] [facility] is ready to reschedule pt's CT scan and he needs updated labs. Can you please put an order in for BMET.  Thanks Lawson Fiscal

## 2019-02-26 NOTE — Telephone Encounter (Signed)
Ok, this order is done

## 2019-02-26 NOTE — Telephone Encounter (Signed)
Routing to dr john, please advise in the absence of dr jones, thanks 

## 2019-02-26 NOTE — Addendum Note (Signed)
Addended by: Corwin Levins on: 02/26/2019 04:37 PM   Modules accepted: Orders

## 2019-02-27 NOTE — Telephone Encounter (Signed)
Routing to lori/referrals, order is in, given by dr Jonny Ruiz

## 2019-03-17 DIAGNOSIS — I10 Essential (primary) hypertension: Secondary | ICD-10-CM | POA: Diagnosis not present

## 2019-03-17 DIAGNOSIS — R0902 Hypoxemia: Secondary | ICD-10-CM | POA: Diagnosis not present

## 2019-03-17 DIAGNOSIS — J188 Other pneumonia, unspecified organism: Secondary | ICD-10-CM | POA: Diagnosis not present

## 2019-03-24 ENCOUNTER — Other Ambulatory Visit: Payer: Self-pay | Admitting: Internal Medicine

## 2019-03-24 DIAGNOSIS — I10 Essential (primary) hypertension: Secondary | ICD-10-CM

## 2019-03-26 ENCOUNTER — Other Ambulatory Visit (INDEPENDENT_AMBULATORY_CARE_PROVIDER_SITE_OTHER): Payer: Medicare Other

## 2019-03-26 DIAGNOSIS — I10 Essential (primary) hypertension: Secondary | ICD-10-CM

## 2019-03-26 LAB — BASIC METABOLIC PANEL
BUN: 18 mg/dL (ref 6–23)
CO2: 23 mEq/L (ref 19–32)
Calcium: 9.4 mg/dL (ref 8.4–10.5)
Chloride: 103 mEq/L (ref 96–112)
Creatinine, Ser: 1.12 mg/dL (ref 0.40–1.50)
GFR: 65.75 mL/min (ref >60.00)
Glucose, Bld: 115 mg/dL — ABNORMAL HIGH (ref 70–99)
Potassium: 4 mEq/L (ref 3.5–5.1)
Sodium: 136 mEq/L (ref 135–145)

## 2019-04-01 ENCOUNTER — Telehealth: Payer: Self-pay | Admitting: *Deleted

## 2019-04-01 NOTE — Telephone Encounter (Signed)

## 2019-04-02 ENCOUNTER — Ambulatory Visit (INDEPENDENT_AMBULATORY_CARE_PROVIDER_SITE_OTHER)
Admission: RE | Admit: 2019-04-02 | Discharge: 2019-04-02 | Disposition: A | Discharge status: Home / Self Care | Payer: Medicare Other | Source: Ambulatory Visit | Attending: Internal Medicine | Admitting: Internal Medicine

## 2019-04-02 ENCOUNTER — Other Ambulatory Visit: Payer: Self-pay

## 2019-04-02 DIAGNOSIS — I712 Thoracic aortic aneurysm, without rupture, unspecified: Secondary | ICD-10-CM

## 2019-04-02 MED ORDER — IOHEXOL 350 MG/ML SOLN
100.0000 mL | Freq: Once | INTRAVENOUS | Status: AC | PRN
  Administered 2019-04-02: 13:00:00 100 mL via INTRAVENOUS

## 2019-04-03 ENCOUNTER — Other Ambulatory Visit: Payer: Self-pay | Admitting: Internal Medicine

## 2019-04-03 ENCOUNTER — Encounter: Payer: Self-pay | Admitting: Internal Medicine

## 2019-04-16 DIAGNOSIS — R0902 Hypoxemia: Secondary | ICD-10-CM | POA: Diagnosis not present

## 2019-04-16 DIAGNOSIS — J188 Other pneumonia, unspecified organism: Secondary | ICD-10-CM | POA: Diagnosis not present

## 2019-04-16 DIAGNOSIS — I10 Essential (primary) hypertension: Secondary | ICD-10-CM | POA: Diagnosis not present

## 2019-04-28 ENCOUNTER — Other Ambulatory Visit: Payer: Self-pay | Admitting: Internal Medicine

## 2019-05-08 ENCOUNTER — Encounter: Payer: Self-pay | Admitting: Gastroenterology

## 2019-05-17 DIAGNOSIS — J188 Other pneumonia, unspecified organism: Secondary | ICD-10-CM | POA: Diagnosis not present

## 2019-05-17 DIAGNOSIS — I10 Essential (primary) hypertension: Secondary | ICD-10-CM | POA: Diagnosis not present

## 2019-05-17 DIAGNOSIS — R0902 Hypoxemia: Secondary | ICD-10-CM | POA: Diagnosis not present

## 2019-05-22 ENCOUNTER — Other Ambulatory Visit: Payer: Self-pay | Admitting: Internal Medicine

## 2019-05-22 DIAGNOSIS — I10 Essential (primary) hypertension: Secondary | ICD-10-CM

## 2019-05-22 DIAGNOSIS — E876 Hypokalemia: Secondary | ICD-10-CM

## 2019-06-03 ENCOUNTER — Other Ambulatory Visit: Payer: Self-pay | Admitting: Pulmonary Disease

## 2019-06-07 ENCOUNTER — Other Ambulatory Visit: Payer: Self-pay | Admitting: Internal Medicine

## 2019-06-07 DIAGNOSIS — E118 Type 2 diabetes mellitus with unspecified complications: Secondary | ICD-10-CM

## 2019-06-08 ENCOUNTER — Other Ambulatory Visit: Payer: Self-pay | Admitting: Internal Medicine

## 2019-06-08 DIAGNOSIS — K2100 Gastro-esophageal reflux disease with esophagitis, without bleeding: Secondary | ICD-10-CM

## 2019-06-16 ENCOUNTER — Other Ambulatory Visit: Payer: Self-pay | Admitting: Internal Medicine

## 2019-06-16 DIAGNOSIS — I10 Essential (primary) hypertension: Secondary | ICD-10-CM

## 2019-06-17 DIAGNOSIS — R0902 Hypoxemia: Secondary | ICD-10-CM | POA: Diagnosis not present

## 2019-06-17 DIAGNOSIS — J188 Other pneumonia, unspecified organism: Secondary | ICD-10-CM | POA: Diagnosis not present

## 2019-06-17 DIAGNOSIS — I10 Essential (primary) hypertension: Secondary | ICD-10-CM | POA: Diagnosis not present

## 2019-06-24 ENCOUNTER — Other Ambulatory Visit (INDEPENDENT_AMBULATORY_CARE_PROVIDER_SITE_OTHER): Payer: Medicare Other

## 2019-06-24 ENCOUNTER — Other Ambulatory Visit: Payer: Self-pay

## 2019-06-24 ENCOUNTER — Encounter: Payer: Self-pay | Admitting: Internal Medicine

## 2019-06-24 ENCOUNTER — Ambulatory Visit (INDEPENDENT_AMBULATORY_CARE_PROVIDER_SITE_OTHER): Payer: Medicare Other | Admitting: Internal Medicine

## 2019-06-24 VITALS — BP 126/80 | HR 98 | Temp 98.0°F | Resp 16 | Ht 67.0 in | Wt 219.0 lb

## 2019-06-24 DIAGNOSIS — N3943 Post-void dribbling: Secondary | ICD-10-CM

## 2019-06-24 DIAGNOSIS — E781 Pure hyperglyceridemia: Secondary | ICD-10-CM

## 2019-06-24 DIAGNOSIS — I10 Essential (primary) hypertension: Secondary | ICD-10-CM

## 2019-06-24 DIAGNOSIS — N401 Enlarged prostate with lower urinary tract symptoms: Secondary | ICD-10-CM

## 2019-06-24 DIAGNOSIS — E118 Type 2 diabetes mellitus with unspecified complications: Secondary | ICD-10-CM

## 2019-06-24 DIAGNOSIS — E785 Hyperlipidemia, unspecified: Secondary | ICD-10-CM

## 2019-06-24 DIAGNOSIS — E559 Vitamin D deficiency, unspecified: Secondary | ICD-10-CM | POA: Diagnosis not present

## 2019-06-24 DIAGNOSIS — Z0000 Encounter for general adult medical examination without abnormal findings: Secondary | ICD-10-CM | POA: Diagnosis not present

## 2019-06-24 DIAGNOSIS — Z23 Encounter for immunization: Secondary | ICD-10-CM

## 2019-06-24 DIAGNOSIS — Z1211 Encounter for screening for malignant neoplasm of colon: Secondary | ICD-10-CM

## 2019-06-24 LAB — BASIC METABOLIC PANEL
BUN: 18 mg/dL (ref 6–23)
CO2: 26 mEq/L (ref 19–32)
Calcium: 10 mg/dL (ref 8.4–10.5)
Chloride: 98 mEq/L (ref 96–112)
Creatinine, Ser: 0.95 mg/dL (ref 0.40–1.50)
GFR: 79.44 mL/min (ref >60.00)
Glucose, Bld: 221 mg/dL — ABNORMAL HIGH (ref 70–99)
Potassium: 4.4 mEq/L (ref 3.5–5.1)
Sodium: 133 mEq/L — ABNORMAL LOW (ref 135–145)

## 2019-06-24 LAB — HEPATIC FUNCTION PANEL
ALT: 30 U/L (ref 0–53)
AST: 17 U/L (ref 0–37)
Albumin: 4.5 g/dL (ref 3.5–5.2)
Alkaline Phosphatase: 81 U/L (ref 39–117)
Bilirubin, Direct: 0.1 mg/dL (ref 0.0–0.3)
Total Bilirubin: 0.4 mg/dL (ref 0.2–1.2)
Total Protein: 6.7 g/dL (ref 6.0–8.3)

## 2019-06-24 LAB — CBC WITH DIFFERENTIAL/PLATELET
Basophils Absolute: 0.1 10*3/uL (ref 0.0–0.1)
Basophils Relative: 1.3 % (ref 0.0–3.0)
Eosinophils Absolute: 0.3 10*3/uL (ref 0.0–0.7)
Eosinophils Relative: 3.6 % (ref 0.0–5.0)
HCT: 42.5 % (ref 39.0–52.0)
Hemoglobin: 14.7 g/dL (ref 13.0–17.0)
Lymphocytes Relative: 18.5 % (ref 12.0–46.0)
Lymphs Abs: 1.7 10*3/uL (ref 0.7–4.0)
MCHC: 34.6 g/dL (ref 30.0–36.0)
MCV: 89.2 fl (ref 78.0–100.0)
Monocytes Absolute: 1 10*3/uL (ref 0.1–1.0)
Monocytes Relative: 10.8 % (ref 3.0–12.0)
Neutro Abs: 6.2 10*3/uL (ref 1.4–7.7)
Neutrophils Relative %: 65.8 % (ref 43.0–77.0)
Platelets: 282 10*3/uL (ref 150.0–400.0)
RBC: 4.77 Mil/uL (ref 4.22–5.81)
RDW: 13.4 % (ref 11.5–15.5)
WBC: 9.4 10*3/uL (ref 4.0–10.5)

## 2019-06-24 LAB — VITAMIN D 25 HYDROXY (VIT D DEFICIENCY, FRACTURES): VITD: 34.31 ng/mL (ref 30.00–100.00)

## 2019-06-24 LAB — URINALYSIS, ROUTINE W REFLEX MICROSCOPIC
Bilirubin Urine: NEGATIVE
Hgb urine dipstick: NEGATIVE
Ketones, ur: NEGATIVE
Leukocytes,Ua: NEGATIVE
Nitrite: NEGATIVE
RBC / HPF: NONE SEEN (ref 0–?)
Specific Gravity, Urine: 1.015 (ref 1.000–1.030)
Total Protein, Urine: NEGATIVE
Urine Glucose: >=1000 — AB
Urobilinogen, UA: 0.2 (ref 0.0–1.0)
WBC, UA: NONE SEEN (ref 0–?)
pH: 7 (ref 5.0–8.0)

## 2019-06-24 LAB — LIPID PANEL
Cholesterol: 242 mg/dL — ABNORMAL HIGH (ref 0–200)
HDL: 36.1 mg/dL — ABNORMAL LOW (ref >39.00)
NonHDL: 206.33
Total CHOL/HDL Ratio: 7
Triglycerides: 289 mg/dL — ABNORMAL HIGH (ref 0.0–149.0)
VLDL: 57.8 mg/dL — ABNORMAL HIGH (ref 0.0–40.0)

## 2019-06-24 LAB — MICROALBUMIN / CREATININE URINE RATIO
Creatinine,U: 43.5 mg/dL
Microalb Creat Ratio: 1.6 mg/g (ref 0.0–30.0)
Microalb, Ur: <0.7 mg/dL (ref 0.0–1.9)

## 2019-06-24 LAB — HEMOGLOBIN A1C: Hgb A1c MFr Bld: 6.6 % — ABNORMAL HIGH (ref 4.6–6.5)

## 2019-06-24 LAB — LDL CHOLESTEROL, DIRECT: Direct LDL: 177 mg/dL

## 2019-06-24 LAB — TSH: TSH: 2 u[IU]/mL (ref 0.35–4.50)

## 2019-06-24 LAB — PSA: PSA: 0.61 ng/mL (ref 0.10–4.00)

## 2019-06-24 NOTE — Progress Notes (Signed)
Subjective:  Patient ID: [name], male    DOB: August 21, 1956  Age: 63 y.o. MRN: [id number]  CC: Annual Exam, Hypertension, Diabetes, and Hyperlipidemia   HPI [name] presents for a CPX.  He complains of weight gain.  He is active and denies any recent episodes of CP or DOE.  He has an unchanged level of shortness of breath at rest.  He denies any episodes of edema, palpitations, diaphoresis, dizziness, or lightheadedness.  Outpatient Medications Prior to Visit  Medication Sig Dispense Refill   aspirin 81 MG EC tablet Take 81 mg by mouth daily.       [facility]S D3 50 MCG (2000 UT) CAPS TAKE 2 CAPSULES (4,000 UNITS TOTAL) BY MOUTH DAILY. 180 capsule 1   famotidine (PEPCID) 40 MG tablet TAKE 1 TABLET BY MOUTH EVERY DAY 90 tablet 1   fexofenadine (ALLEGRA) 180 MG tablet Take 180 mg by mouth daily as needed for allergies.      KLOR-CON M20 20 MEQ tablet TAKE 1 TABLET BY MOUTH TWICE A DAY 180 tablet 1   metFORMIN (GLUCOPHAGE) 1000 MG tablet TAKE 1 TABLET (1,000 MG TOTAL) BY MOUTH 2 (TWO) TIMES DAILY WITH A MEAL. 180 tablet 1   NON FORMULARY Oxygen continuous 1.5 liters with activity      olmesartan (BENICAR) 40 MG tablet Take 1 tablet (40 mg total) by mouth daily. 90 tablet 1   ONETOUCH VERIO test strip USE 1 PER DAY 100 each 2   STIOLTO RESPIMAT 2.5-2.5 MCG/ACT AERS INHALE 2 PUFFS BY MOUTH INTO THE LUNGS DAILY 4 g 1   hydrochlorothiazide (MICROZIDE) 12.5 MG capsule TAKE 1 CAPSULE BY MOUTH EVERY DAY 90 capsule 0   No facility-administered medications prior to visit.     ROS Review of Systems  Constitutional: Negative for appetite change, diaphoresis, fatigue and unexpected weight change.  HENT: Negative.  Negative for trouble swallowing and voice change.   Respiratory: Positive for shortness of breath. Negative for cough, chest tightness and wheezing.   Cardiovascular: Negative for chest pain, palpitations and leg swelling.  Gastrointestinal: Negative for abdominal pain,  blood in stool, constipation, diarrhea, nausea and vomiting.  Endocrine: Negative.   Genitourinary: Negative.  Negative for difficulty urinating, penile pain, penile swelling and scrotal swelling.  Musculoskeletal: Negative.  Negative for arthralgias and myalgias.  Skin: Negative.  Negative for color change, pallor and rash.  Neurological: Negative.  Negative for dizziness and light-headedness.  Hematological: Negative for adenopathy. Does not bruise/bleed easily.  Psychiatric/Behavioral: Negative.     Objective:  BP 126/80 (BP Location: Left Arm, Patient Position: Sitting, Cuff Size: Normal)    Pulse 98    Temp 98 F (36.7 C) (Oral)    Resp 16    Ht 5\' 7"  (1.702 m)    Wt 219 lb (99.3 kg)    SpO2 96%    BMI 34.30 kg/m   BP Readings from Last 3 Encounters:  06/24/19 126/80  12/26/18 (!) 120/92  12/26/18 (!) 120/92    Wt Readings from Last 3 Encounters:  06/24/19 219 lb (99.3 kg)  12/26/18 216 lb (98 kg)  12/26/18 216 lb (98 kg)    Physical Exam Constitutional:      General: He is not in acute distress.    Appearance: Normal appearance. He is ill-appearing. He is not toxic-appearing or diaphoretic.     Comments: Oxygen dependent  HENT:     Nose: Nose normal.     Mouth/Throat:     Mouth:  Mucou<[id number].LatFremont Ambu art size normal. No pericardial effusion. Unremarkable central pulmonary arteries; the exam was not optimized r detectiLilian [id number] DrusAlethia Berthold KannerooAggie Cosiernt ast opacification  of the thoracic aorta. No dissection or stenosis. Maximum transverse diameters as follows: 4 cm sinuses of Valsalva 3.3 cm sino-tubular junction 4 cm mid ascending (previously 3.9) 3.5 cm distal ascending/proximal arch 2.8 cm distal arch 3.2 cm proximal descending 2.8 cm distal descending Classic 3 vessel brachiocephalic arterial origin anatomy without proximal stenosis. Scattered calcified plaque in the arch and descending thoracic segment. Visualized proximal abdominal aorta unremarkable. Mediastinum/Nodes: No hilar or mediastinal adenopathy. Lungs/Pleura: No pleural effusion. No pneumothorax. Emphysematous changes most evident in the upper lobes. Upper Abdomen: No acute findings. Musculoskeletal: No chest wall abnormality. No acute or significant osseous findings. Review of the MIP images confirms the above findings. IMPRESSION: Stable 4 cm ascending aortic aneurysm without complicating features. Recommend annual imaging followup by CTA or MRA. This recommendation follows 2010 ACCF/AHA/AATS/ACR/ASA/SCA/SCAI/SIR/STS/SVM Guidelines for the Diagnosis and Management of Patients with Thoracic Aortic Disease. 2010; 121: Y099-I338. Aortic Atherosclerosis (ICD10-I70.0) and Emphysema (ICD10-J43.9). Electronically Signed   By: [name] M.D.   On: 04/03/2019 09:51    Assessment & Plan:   [name] was seen today for annual exam, hypertension, diabetes and hyperlipidemia.  Diagnoses and all orders for this visit:  Essential hypertension, benign- His blood pressure is adequately well controlled. -      CBC with Differential/Platelet; Future -     TSH; Future -     Urinalysis, Routine w reflex microscopic; Future  Type II diabetes mellitus with manifestations ([place])- His A1c is at 6.6%.  His blood sugars are adequately well controlled. -     Basic metabolic panel; Future -     Hemoglobin A1c; Future -     Microalbumin / creatinine urine ratio; Future -     HM Diabetes Foot Exam  Benign prostatic hyperplasia with post-void dribbling- His PSA is low which is reassuring that he does not have prostate cancer.  He has no symptoms that need to be treated. -     PSA; Future  Pure hyperglyceridemia- I have asked him to start taking icosapent ethyl to lower the triglycerides, to reduce the risk of complications, and for cardiovascular risk reduction. -     Lipid panel; Future -     Icosapent Ethyl (VASCEPA) 1 g CAPS; Take 2 capsules (2 g total) by mouth 2 (two) times daily.  Routine general medical examination at a health care facility- Exam completed, labs reviewed, vaccines reviewed and updated, he is referred for colon cancer screening, patient education was given.  Vitamin D deficiency disease -     VITAMIN D 25 Hydroxy (Vit-D Deficiency, Fractures); Future  Hyperlipidemia with target LDL less than 130- He is not willing to take a statin. -     TSH; Future -     Hepatic function panel; Future  Need for influenza vaccination -     Flu Vaccine QUAD High Dose(Fluad)  Colon cancer screening -     Cologuard   I am having Kelly Splinter. Mangel start on Vascepa. I am also having him maintain his aspirin, NON FORMULARY, fexofenadine, OneTouch Verio, metFORMIN, olmesartan, CVS D3, Klor-Con M20, Stiolto Respimat, and famotidine.  Meds ordered this encounter  Medications   Icosapent Ethyl (VASCEPA) 1 g CAPS    Sig: Take 2 capsules (2 g total) by mouth 2 (two) times daily.    Dispense:  360 capsule    Refill:  1     Follow-up: Return in about 6 months (around 12/22/2019).  Scarlette Calico, MD

## 2019-06-24 NOTE — Patient Instructions (Signed)

## 2019-06-25 ENCOUNTER — Other Ambulatory Visit: Payer: Self-pay | Admitting: Internal Medicine

## 2019-06-25 ENCOUNTER — Encounter: Payer: Self-pay | Admitting: Internal Medicine

## 2019-06-25 DIAGNOSIS — I10 Essential (primary) hypertension: Secondary | ICD-10-CM

## 2019-06-25 MED ORDER — VASCEPA 1 G PO CAPS: 2.0000 | ORAL_CAPSULE | Freq: Two times a day (BID) | ORAL | 1 refills | Status: DC

## 2019-06-29 ENCOUNTER — Other Ambulatory Visit: Payer: Self-pay | Admitting: Internal Medicine

## 2019-06-29 DIAGNOSIS — I10 Essential (primary) hypertension: Secondary | ICD-10-CM

## 2019-06-29 DIAGNOSIS — E118 Type 2 diabetes mellitus with unspecified complications: Secondary | ICD-10-CM

## 2019-07-17 DIAGNOSIS — I10 Essential (primary) hypertension: Secondary | ICD-10-CM | POA: Diagnosis not present

## 2019-07-17 DIAGNOSIS — R0902 Hypoxemia: Secondary | ICD-10-CM | POA: Diagnosis not present

## 2019-07-17 DIAGNOSIS — J188 Other pneumonia, unspecified organism: Secondary | ICD-10-CM | POA: Diagnosis not present

## 2019-08-17 DIAGNOSIS — I10 Essential (primary) hypertension: Secondary | ICD-10-CM | POA: Diagnosis not present

## 2019-08-17 DIAGNOSIS — J188 Other pneumonia, unspecified organism: Secondary | ICD-10-CM | POA: Diagnosis not present

## 2019-08-17 DIAGNOSIS — R0902 Hypoxemia: Secondary | ICD-10-CM | POA: Diagnosis not present

## 2019-09-16 DIAGNOSIS — R0902 Hypoxemia: Secondary | ICD-10-CM | POA: Diagnosis not present

## 2019-09-16 DIAGNOSIS — J188 Other pneumonia, unspecified organism: Secondary | ICD-10-CM | POA: Diagnosis not present

## 2019-09-16 DIAGNOSIS — I10 Essential (primary) hypertension: Secondary | ICD-10-CM | POA: Diagnosis not present

## 2019-09-24 ENCOUNTER — Other Ambulatory Visit: Payer: Self-pay | Admitting: Internal Medicine

## 2019-09-24 DIAGNOSIS — I10 Essential (primary) hypertension: Secondary | ICD-10-CM

## 2019-09-25 ENCOUNTER — Other Ambulatory Visit: Payer: Self-pay | Admitting: Internal Medicine

## 2019-09-25 DIAGNOSIS — I10 Essential (primary) hypertension: Secondary | ICD-10-CM

## 2019-09-25 DIAGNOSIS — E876 Hypokalemia: Secondary | ICD-10-CM

## 2019-10-17 DIAGNOSIS — R0902 Hypoxemia: Secondary | ICD-10-CM | POA: Diagnosis not present

## 2019-10-17 DIAGNOSIS — I10 Essential (primary) hypertension: Secondary | ICD-10-CM | POA: Diagnosis not present

## 2019-10-17 DIAGNOSIS — J188 Other pneumonia, unspecified organism: Secondary | ICD-10-CM | POA: Diagnosis not present

## 2019-11-17 DIAGNOSIS — I10 Essential (primary) hypertension: Secondary | ICD-10-CM | POA: Diagnosis not present

## 2019-11-17 DIAGNOSIS — R0902 Hypoxemia: Secondary | ICD-10-CM | POA: Diagnosis not present

## 2019-12-09 ENCOUNTER — Other Ambulatory Visit: Payer: Self-pay | Admitting: Internal Medicine

## 2019-12-09 DIAGNOSIS — K2100 Gastro-esophageal reflux disease with esophagitis, without bleeding: Secondary | ICD-10-CM

## 2019-12-09 MED ORDER — FAMOTIDINE 40 MG PO TABS: 40.0000 mg | ORAL_TABLET | Freq: Every day | ORAL | 1 refills | Status: DC

## 2019-12-15 DIAGNOSIS — R0902 Hypoxemia: Secondary | ICD-10-CM | POA: Diagnosis not present

## 2019-12-15 DIAGNOSIS — I10 Essential (primary) hypertension: Secondary | ICD-10-CM | POA: Diagnosis not present

## 2019-12-22 ENCOUNTER — Encounter: Payer: Self-pay | Admitting: Internal Medicine

## 2019-12-22 ENCOUNTER — Ambulatory Visit (INDEPENDENT_AMBULATORY_CARE_PROVIDER_SITE_OTHER): Payer: Medicare Other | Admitting: Internal Medicine

## 2019-12-22 ENCOUNTER — Other Ambulatory Visit: Payer: Self-pay

## 2019-12-22 VITALS — BP 138/86 | HR 90 | Temp 97.8°F | Resp 20 | Ht 67.0 in | Wt 228.1 lb

## 2019-12-22 DIAGNOSIS — E118 Type 2 diabetes mellitus with unspecified complications: Secondary | ICD-10-CM | POA: Diagnosis not present

## 2019-12-22 DIAGNOSIS — R0789 Other chest pain: Secondary | ICD-10-CM

## 2019-12-22 DIAGNOSIS — E785 Hyperlipidemia, unspecified: Secondary | ICD-10-CM

## 2019-12-22 DIAGNOSIS — E781 Pure hyperglyceridemia: Secondary | ICD-10-CM

## 2019-12-22 DIAGNOSIS — I10 Essential (primary) hypertension: Secondary | ICD-10-CM | POA: Diagnosis not present

## 2019-12-22 DIAGNOSIS — Z789 Other specified health status: Secondary | ICD-10-CM

## 2019-12-22 LAB — BASIC METABOLIC PANEL
BUN: 15 mg/dL (ref 6–23)
CO2: 21 mEq/L (ref 19–32)
Calcium: 9.8 mg/dL (ref 8.4–10.5)
Chloride: 103 mEq/L (ref 96–112)
Creatinine, Ser: 1.03 mg/dL (ref 0.40–1.50)
GFR: 72.25 mL/min (ref >60.00)
Glucose, Bld: 167 mg/dL — ABNORMAL HIGH (ref 70–99)
Potassium: 4.1 mEq/L (ref 3.5–5.1)
Sodium: 133 mEq/L — ABNORMAL LOW (ref 135–145)

## 2019-12-22 LAB — TRIGLYCERIDES: Triglycerides: 301 mg/dL — ABNORMAL HIGH (ref 0.0–149.0)

## 2019-12-22 LAB — TROPONIN I (HIGH SENSITIVITY): High Sens Troponin I: 5 ng/L (ref 2–17)

## 2019-12-22 LAB — HEMOGLOBIN A1C: Hgb A1c MFr Bld: 7.5 % — ABNORMAL HIGH (ref 4.6–6.5)

## 2019-12-22 MED ORDER — NEXLIZET 180-10 MG PO TABS: 1.0000 | ORAL_TABLET | Freq: Every day | ORAL | 0 refills | Status: DC

## 2019-12-22 MED ORDER — ICOSAPENT ETHYL 1 G PO CAPS: 2.0000 g | ORAL_CAPSULE | Freq: Two times a day (BID) | ORAL | 1 refills | Status: DC

## 2019-12-22 NOTE — Progress Notes (Signed)
Subjective:  Patient ID: [name], male    DOB: 1956/08/29  Age: 64 y.o. MRN: [id number]  CC: Hypertension, Hyperlipidemia, Diabetes, and Chest Pain   This visit occurred during the SARS-CoV-2 public health emergency.  Safety protocols were in place, including screening questions prior to the visit, additional usage of staff PPE, and extensive cleaning of exam room while observing appropriate contact time as indicated for disinfecting solutions.    HPI [name] presents for f/up - Since I last saw him he has developed chest pain.  He describes a tightness in his chest that he notices in the morning when he is getting up and moving about.  He does not experience the pain later in the day.  He has his baseline level of shortness of breath which has not worsened.  He denies diaphoresis, dizziness, lightheadedness, palpitations, or edema.  Outpatient Medications Prior to Visit  Medication Sig Dispense Refill  . aspirin 81 MG EC tablet Take 81 mg by mouth daily.      . CVS D3 50 MCG (2000 UT) CAPS TAKE 2 CAPSULES (4,000 UNITS TOTAL) BY MOUTH DAILY. 180 capsule 1  . famotidine (PEPCID) 40 MG tablet Take 1 tablet (40 mg total) by mouth daily. 90 tablet 1  . fexofenadine (ALLEGRA) 180 MG tablet Take 180 mg by mouth daily as needed for allergies.     . hydrochlorothiazide (MICROZIDE) 12.5 MG capsule TAKE 1 CAPSULE BY MOUTH EVERY DAY 90 capsule 0  . KLOR-CON M20 20 MEQ tablet TAKE 1 TABLET BY MOUTH TWICE A DAY 180 tablet 1  . metFORMIN (GLUCOPHAGE) 1000 MG tablet TAKE 1 TABLET (1,000 MG TOTAL) BY MOUTH 2 (TWO) TIMES DAILY WITH A MEAL. 180 tablet 1  . NON FORMULARY Oxygen continuous 1.5 liters with activity     . olmesartan (BENICAR) 40 MG tablet TAKE 1 TABLET BY MOUTH EVERY DAY 90 tablet 1  . ONETOUCH VERIO test strip USE 1 PER DAY 100 each 2  . STIOLTO RESPIMAT 2.5-2.5 MCG/ACT AERS INHALE 2 PUFFS BY MOUTH INTO THE LUNGS DAILY 4 g 1  . Icosapent Ethyl (VASCEPA) 1 g CAPS Take 2 capsules (2  g total) by mouth 2 (two) times daily. (Patient not taking: Reported on 12/22/2019) 360 capsule 1   No facility-administered medications prior to visit.    ROS Review of Systems  Constitutional: Negative for appetite change, diaphoresis, fatigue and unexpected weight change.  HENT: Negative.   Eyes: Negative.   Respiratory: Positive for chest tightness and shortness of breath. Negative for cough, wheezing and stridor.   Cardiovascular: Negative for chest pain, palpitations and leg swelling.  Gastrointestinal: Negative for abdominal pain, constipation, diarrhea, nausea and vomiting.  Endocrine: Negative.   Genitourinary: Negative.  Negative for difficulty urinating.  Musculoskeletal: Negative.  Negative for arthralgias and myalgias.  Skin: Negative.  Negative for color change, pallor and rash.  Neurological: Negative.  Negative for dizziness, weakness, light-headedness and headaches.  Hematological: Negative for adenopathy. Does not bruise/bleed easily.  Psychiatric/Behavioral: Negative.     Objective:  BP 138/86 (BP Location: Left Arm, Patient Position: Sitting, Cuff Size: Normal)   Pulse 90   Temp 97.8 F (36.6 C) (Oral)   Resp 20   Ht 5\' 7"  (1.702 m)   Wt 228 lb 2 oz (103.5 kg)   SpO2 96%   BMI 35.73 kg/m   BP Readings from Last 3 Encounters:  12/22/19 138/86  06/24/19 126/80  12/26/18 (!) 120/92    Wt Readings  from Last 3 Encounters:  12/22/19 228 lb 2 oz (103.5 kg)  06/24/19 219 lb (99.3 kg)  12/26/18 216 lb (98 kg)    Physical Exam Vitals reviewed.  Constitutional:      General: He is not in acute distress.    Appearance: He is ill-appearing (02 dependent). He is not toxic-appearing or diaphoretic.  HENT:     Mouth/Throat:     Mouth: Mucous membranes are moist.  Eyes:     General: No scleral icterus.    Conjunctiva/sclera: Conjunctivae normal.  Cardiovascular:     Rate and Rhythm: Normal rate and regular rhythm.     Heart sounds: No murmur. No gallop.       Comments: EKG -  Sinus arrhythmia with 1 PVC. No LVH. No Q waves. No ST or T wave changes.  Pulmonary:     Effort: Pulmonary effort is normal.     Breath sounds: No stridor. No wheezing, rhonchi or rales.  Abdominal:     General: Abdomen is protuberant. Bowel sounds are normal. There is no distension.     Palpations: Abdomen is soft. There is no hepatomegaly, splenomegaly or mass.     Tenderness: There is no abdominal tenderness.  Musculoskeletal:     Cervical back: Normal range of motion and neck supple.     Right lower leg: No edema.     Left lower leg: No edema.  Skin:    General: Skin is warm.     Coloration: Skin is not pale.     Findings: No lesion or rash.  Neurological:     General: No focal deficit present.     Mental Status: He is alert.  Psychiatric:        Mood and Affect: Mood normal.        Behavior: Behavior normal.     Lab Results  Component Value Date   WBC 9.4 06/24/2019   HGB 14.7 06/24/2019   HCT 42.5 06/24/2019   PLT 282.0 06/24/2019   GLUCOSE 167 (H) 12/22/2019   CHOL 242 (H) 06/24/2019   TRIG 301.0 (H) 12/22/2019   HDL 36.10 (L) 06/24/2019   LDLDIRECT 177.0 06/24/2019   LDLCALC 35 01/03/2017   ALT 30 06/24/2019   AST 17 06/24/2019   NA 133 (L) 12/22/2019   K 4.1 12/22/2019   CL 103 12/22/2019   CREATININE 1.03 12/22/2019   BUN 15 12/22/2019   CO2 21 12/22/2019   TSH 2.00 06/24/2019   PSA 0.61 06/24/2019   INR 1.2 08/04/2008   HGBA1C 7.5 (H) 12/22/2019   MICROALBUR <0.7 06/24/2019    CT ANGIO CHEST AORTA W/CM &/OR WO/CM  Result Date: 04/03/2019 CLINICAL DATA:  Follow-up thoracic aortic aneurysm EXAM: CT ANGIOGRAPHY CHEST WITH CONTRAST TECHNIQUE: Multidetector CT imaging of the chest was performed using the standard protocol during bolus administration of intravenous contrast. Multiplanar CT image reconstructions and MIPs were obtained to evaluate the vascular anatomy. CONTRAST:  OMNIPAQUE IOHEXOL 350 MG/ML SOLN COMPARISON:   12/25/2017 and previous FINDINGS: Cardiovascular: Heart size normal. No pericardial effusion. Unremarkable central pulmonary arteries; the exam was not optimized for detection of pulmonary emboli. Good contrast opacification of the thoracic aorta. No dissection or stenosis. Maximum transverse diameters as follows: 4 cm sinuses of Valsalva 3.3 cm sino-tubular junction 4 cm mid ascending (previously 3.9) 3.5 cm distal ascending/proximal arch 2.8 cm distal arch 3.2 cm proximal descending 2.8 cm distal descending Classic 3 vessel brachiocephalic arterial origin anatomy without proximal stenosis. Scattered calcified plaque  in the arch and descending thoracic segment. Visualized proximal abdominal aorta unremarkable. Mediastinum/Nodes: No hilar or mediastinal adenopathy. Lungs/Pleura: No pleural effusion. No pneumothorax. Emphysematous changes most evident in the upper lobes. Upper Abdomen: No acute findings. Musculoskeletal: No chest wall abnormality. No acute or significant osseous findings. Review of the MIP images confirms the above findings. IMPRESSION: Stable 4 cm ascending aortic aneurysm without complicating features. Recommend annual imaging followup by CTA or MRA. This recommendation follows 2010 ACCF/AHA/AATS/ACR/ASA/SCA/SCAI/SIR/STS/SVM Guidelines for the Diagnosis and Management of Patients with Thoracic Aortic Disease. 2010; 121: F751-W258. Aortic Atherosclerosis (ICD10-I70.0) and Emphysema (ICD10-J43.9). Electronically Signed   By: Corlis Leak M.D.   On: 04/03/2019 09:51    Assessment & Plan:   Chez was seen today for hypertension, hyperlipidemia, diabetes and chest pain.  Diagnoses and all orders for this visit:  Essential hypertension, benign- He has not quite achieved his blood pressure goal of 130/80.  I will be adding an SGLT2 inhibitor to his hypoglycemic regimen and I anticipate this will help him achieve his blood pressure goal. -     Basic metabolic panel  Type II diabetes mellitus  with manifestations (H[facility])-  His A1c is up to 7.5%.  His blood sugar is not adequately well controlled.  I recommended that he had an SGLT2 inhibitor to his current regimen. -     Basic metabolic panel -     Hemoglobin A1c -     Ambulatory referral to Ophthalmology -     dapagliflozin propanediol (FARXIGA) 5 MG TABS tablet; Take 5 mg by mouth daily before breakfast.  Hyperlipidemia with target LDL less than 130- He is not willing to take a statin.  I have asked him to start taking bempedoic acid and ezetimibe to lower his LDL and to reduce his cardiovascular risk. -     Bempedoic Acid-Ezetimibe (NEXLIZET) 180-10 MG TABS; Take 1 tablet by mouth daily.  Pure hyperglyceridemia- His triglycerides remain elevated.  I have asked him to be more compliant with the icosapent ethyl and to work on his lifestyle modifications. -     Triglycerides -     icosapent Ethyl (VASCEPA) 1 g capsule; Take 2 capsules (2 g total) by mouth 2 (two) times daily.  Chest tightness- His EKG is negative for ischemia.  He is high risk.  I recommended that he undergo a myocardial perfusion imaging to screen for CAD. -     EKG 12-Lead -     Troponin I (High Sensitivity) -     MYOCARDIAL PERFUSION IMAGING; Future   I have changed Darel Hong. Besson's Vascepa to icosapent Ethyl. I am also having him start on Nexlizet and [place]. Additionally, I am having him maintain his aspirin, NON FORMULARY, fexofenadine, OneTouch Verio, CVS D3, Stiolto Respimat, olmesartan, metFORMIN, hydrochlorothiazide, Klor-Con M20, and famotidine.  Meds ordered this encounter  Medications  . Bempedoic Acid-Ezetimibe (NEXLIZET) 180-10 MG TABS    Sig: Take 1 tablet by mouth daily.    Dispense:  70 tablet    Refill:  0  . icosapent Ethyl (VASCEPA) 1 g capsule    Sig: Take 2 capsules (2 g total) by mouth 2 (two) times daily.    Dispense:  360 capsule    Refill:  1  . dapagliflozin propanediol (FARXIGA) 5 MG TABS tablet    Sig: Take 5 mg by mouth daily  before breakfast.    Dispense:  90 tablet    Refill:  0     Follow-up: Return in about 4  months (around 04/22/2020).  Scarlette Calico, MD

## 2019-12-22 NOTE — Patient Instructions (Addendum)

## 2019-12-23 ENCOUNTER — Encounter: Payer: Self-pay | Admitting: Internal Medicine

## 2019-12-23 MED ORDER — FARXIGA 5 MG PO TABS: 5.0000 mg | ORAL_TABLET | Freq: Every day | ORAL | 0 refills | Status: DC

## 2019-12-24 ENCOUNTER — Other Ambulatory Visit: Payer: Self-pay

## 2019-12-24 ENCOUNTER — Telehealth: Payer: Self-pay

## 2019-12-24 DIAGNOSIS — E118 Type 2 diabetes mellitus with unspecified complications: Secondary | ICD-10-CM

## 2019-12-24 DIAGNOSIS — I10 Essential (primary) hypertension: Secondary | ICD-10-CM

## 2019-12-24 MED ORDER — OLMESARTAN MEDOXOMIL 40 MG PO TABS: 40.0000 mg | ORAL_TABLET | Freq: Every day | ORAL | 1 refills | Status: DC

## 2019-12-24 NOTE — Telephone Encounter (Signed)
New message    The patient calling stating the price of medication dapagliflozin propanediol (FARXIGA) 5 MG TABS tablet was High $ 140.00 he already purchased the meds for now.   Need clarification on medication metFORMIN (GLUCOPHAGE) 1000 MG tablet should he continue or discontinue

## 2019-12-25 MED ORDER — HYDROCHLOROTHIAZIDE 12.5 MG PO CAPS: 12.5000 mg | ORAL_CAPSULE | Freq: Every day | ORAL | 1 refills | Status: DC

## 2019-12-25 NOTE — Telephone Encounter (Signed)
Pt requesting an alternative to the [place]. The copay is too expensive for pt to afford.   Pt informed to continue metformin.

## 2019-12-25 NOTE — Addendum Note (Signed)
Addended by: Verlan Friends on: 12/25/2019 07:39 AM   Modules accepted: Orders

## 2019-12-26 ENCOUNTER — Other Ambulatory Visit: Payer: Self-pay | Admitting: Internal Medicine

## 2019-12-26 DIAGNOSIS — E118 Type 2 diabetes mellitus with unspecified complications: Secondary | ICD-10-CM

## 2019-12-29 ENCOUNTER — Ambulatory Visit: Payer: Medicare Other

## 2019-12-30 ENCOUNTER — Encounter: Payer: Self-pay | Admitting: Internal Medicine

## 2020-01-15 DIAGNOSIS — I10 Essential (primary) hypertension: Secondary | ICD-10-CM | POA: Diagnosis not present

## 2020-01-15 DIAGNOSIS — R0902 Hypoxemia: Secondary | ICD-10-CM | POA: Diagnosis not present

## 2020-01-20 ENCOUNTER — Ambulatory Visit: Payer: Medicare Other

## 2020-01-28 ENCOUNTER — Telehealth (HOSPITAL_COMMUNITY): Payer: Self-pay | Admitting: *Deleted

## 2020-01-28 NOTE — Telephone Encounter (Signed)
Patient given detailed instructions per Myocardial Perfusion Study Information Sheet for the test on 02/02/20 Patient notified to arrive 15 minutes early and that it is imperative to arrive on time for appointment to keep from having the test rescheduled.  If you need to cancel or reschedule your appointment, please call the office within 24 hours of your appointment. . Patient verbalized understanding. Lethia Donlon Jacqueline    

## 2020-02-02 ENCOUNTER — Other Ambulatory Visit: Payer: Self-pay

## 2020-02-02 ENCOUNTER — Ambulatory Visit (HOSPITAL_COMMUNITY): Payer: Medicare Other | Attending: Cardiovascular Disease

## 2020-02-02 DIAGNOSIS — R0789 Other chest pain: Secondary | ICD-10-CM | POA: Diagnosis not present

## 2020-02-02 LAB — MYOCARDIAL PERFUSION IMAGING
LV dias vol: 60 mL (ref 62–150)
LV sys vol: 20 mL
Peak HR: 102 {beats}/min
Rest HR: 80 {beats}/min
SDS: 0
SRS: 0
SSS: 0
TID: 1.23

## 2020-02-02 MED ORDER — TECHNETIUM TC 99M TETROFOSMIN IV KIT
11.0000 | PACK | Freq: Once | INTRAVENOUS | Status: AC | PRN
  Administered 2020-02-02: 10:00:00 11 via INTRAVENOUS
  Filled 2020-02-02: qty 11

## 2020-02-02 MED ORDER — TECHNETIUM TC 99M TETROFOSMIN IV KIT
31.1000 | PACK | Freq: Once | INTRAVENOUS | Status: AC | PRN
  Administered 2020-02-02: 31.1 via INTRAVENOUS
  Filled 2020-02-02: qty 32

## 2020-02-02 MED ORDER — REGADENOSON 0.4 MG/5ML IV SOLN
0.4000 mg | Freq: Once | INTRAVENOUS | Status: AC
  Administered 2020-02-02: 0.4 mg via INTRAVENOUS

## 2020-02-14 DIAGNOSIS — I10 Essential (primary) hypertension: Secondary | ICD-10-CM | POA: Diagnosis not present

## 2020-02-14 DIAGNOSIS — R0902 Hypoxemia: Secondary | ICD-10-CM | POA: Diagnosis not present

## 2020-02-20 ENCOUNTER — Telehealth: Payer: Self-pay | Admitting: Internal Medicine

## 2020-02-20 NOTE — Telephone Encounter (Signed)
I got an Therapist, occupational on this

## 2020-02-20 NOTE — Telephone Encounter (Signed)
   Patient calling to discuss denial from insurance for nuclear stress test.done 4/19 Denial states more information needed Patient requesting appeal with [facility]

## 2020-02-20 NOTE — Telephone Encounter (Signed)
Spoke to patient and he stated that he did receive an approval letter before procedure and then a denial letter after procedure was done.   Pt will bring me both letters, hopefully today.

## 2020-02-20 NOTE — Telephone Encounter (Signed)
Patient dropped off both the approval and denial letters.   In order to appeal the decision a letter must be submited.   Faxed letter to [facility] Fast Appeal to [phone]  Pt contacted and informed of same.

## 2020-02-23 NOTE — Telephone Encounter (Addendum)
   Fannie Knee from [facility] requesting return call [phone] [facility] calling to close appeal. Please call

## 2020-02-25 NOTE — Telephone Encounter (Signed)
[employer] Expedited Appeals needs the appeal to be refaxed.

## 2020-02-25 NOTE — Telephone Encounter (Signed)
Lvm for [name] to call back at her earliest convenience.

## 2020-02-26 NOTE — Telephone Encounter (Signed)
Fax was sent yesterday. 

## 2020-03-11 NOTE — Telephone Encounter (Signed)
Received fax stating that they are reviewing the appeal and will reach out to the patient once they have a decision.

## 2020-03-16 DIAGNOSIS — R0902 Hypoxemia: Secondary | ICD-10-CM | POA: Diagnosis not present

## 2020-03-16 DIAGNOSIS — I10 Essential (primary) hypertension: Secondary | ICD-10-CM | POA: Diagnosis not present

## 2020-03-16 NOTE — Telephone Encounter (Signed)
    Patient calling for status of appeal

## 2020-03-17 ENCOUNTER — Telehealth: Payer: Self-pay | Admitting: Internal Medicine

## 2020-03-17 NOTE — Telephone Encounter (Signed)
New message:    Pt is calling to let us know that his appeal was approved although it stated it was denied. Pt states he just wanted to let us know.

## 2020-03-18 ENCOUNTER — Encounter: Payer: Self-pay | Admitting: Internal Medicine

## 2020-03-19 NOTE — Telephone Encounter (Signed)
I called [employer] today and left message for Sue Amantha Sklar to return call to office to discuss appeals decision and clarify if it had been approved or denied.  

## 2020-03-19 NOTE — Telephone Encounter (Signed)
I called [employer] today and left message for [name] to return call to office to discuss appeals decision and clarify if it had been approved or denied.

## 2020-03-23 ENCOUNTER — Other Ambulatory Visit: Payer: Self-pay | Admitting: Internal Medicine

## 2020-03-23 DIAGNOSIS — Z789 Other specified health status: Secondary | ICD-10-CM | POA: Insufficient documentation

## 2020-03-31 NOTE — Telephone Encounter (Signed)
Added these notes to the original telephone encounter.

## 2020-03-31 NOTE — Telephone Encounter (Signed)
A new telephone note was started on June 2nd.   Message states: Pt calling to let us know that his appeal was approved although it stated it was denied. Pt states he just wanted to let us know.

## 2020-04-02 ENCOUNTER — Other Ambulatory Visit: Payer: Self-pay | Admitting: Internal Medicine

## 2020-04-02 DIAGNOSIS — I712 Thoracic aortic aneurysm, without rupture, unspecified: Secondary | ICD-10-CM

## 2020-04-03 LAB — IFOBT (OCCULT BLOOD): IFOBT: POSITIVE

## 2020-04-06 ENCOUNTER — Other Ambulatory Visit: Payer: Self-pay | Admitting: Internal Medicine

## 2020-04-08 ENCOUNTER — Other Ambulatory Visit (INDEPENDENT_AMBULATORY_CARE_PROVIDER_SITE_OTHER): Payer: Medicare Other

## 2020-04-08 DIAGNOSIS — I712 Thoracic aortic aneurysm, without rupture, unspecified: Secondary | ICD-10-CM

## 2020-04-09 LAB — BASIC METABOLIC PANEL
BUN: 15 mg/dL (ref 6–23)
CO2: 21 mEq/L (ref 19–32)
Calcium: 9.9 mg/dL (ref 8.4–10.5)
Chloride: 104 mEq/L (ref 96–112)
Creatinine, Ser: 1.06 mg/dL (ref 0.40–1.50)
GFR: 69.83 mL/min (ref >60.00)
Glucose, Bld: 113 mg/dL — ABNORMAL HIGH (ref 70–99)
Potassium: 4.1 mEq/L (ref 3.5–5.1)
Sodium: 138 mEq/L (ref 135–145)

## 2020-04-15 ENCOUNTER — Other Ambulatory Visit: Payer: Self-pay | Admitting: Internal Medicine

## 2020-04-15 ENCOUNTER — Other Ambulatory Visit: Payer: Self-pay

## 2020-04-15 ENCOUNTER — Ambulatory Visit (INDEPENDENT_AMBULATORY_CARE_PROVIDER_SITE_OTHER)
Admission: RE | Admit: 2020-04-15 | Discharge: 2020-04-15 | Disposition: A | Discharge status: Home / Self Care | Payer: Medicare Other | Source: Ambulatory Visit | Attending: Internal Medicine | Admitting: Internal Medicine

## 2020-04-15 DIAGNOSIS — J479 Bronchiectasis, uncomplicated: Secondary | ICD-10-CM | POA: Diagnosis not present

## 2020-04-15 DIAGNOSIS — J432 Centrilobular emphysema: Secondary | ICD-10-CM | POA: Diagnosis not present

## 2020-04-15 DIAGNOSIS — I700 Atherosclerosis of aorta: Secondary | ICD-10-CM | POA: Diagnosis not present

## 2020-04-15 DIAGNOSIS — I712 Thoracic aortic aneurysm, without rupture, unspecified: Secondary | ICD-10-CM

## 2020-04-15 DIAGNOSIS — R0902 Hypoxemia: Secondary | ICD-10-CM | POA: Diagnosis not present

## 2020-04-15 DIAGNOSIS — R195 Other fecal abnormalities: Secondary | ICD-10-CM | POA: Insufficient documentation

## 2020-04-15 DIAGNOSIS — I10 Essential (primary) hypertension: Secondary | ICD-10-CM | POA: Diagnosis not present

## 2020-04-15 MED ORDER — IOHEXOL 350 MG/ML SOLN
100.0000 mL | Freq: Once | INTRAVENOUS | Status: AC | PRN
  Administered 2020-04-15: 100 mL via INTRAVENOUS

## 2020-04-21 ENCOUNTER — Other Ambulatory Visit: Payer: Self-pay | Admitting: Internal Medicine

## 2020-04-21 DIAGNOSIS — E876 Hypokalemia: Secondary | ICD-10-CM

## 2020-04-21 DIAGNOSIS — I10 Essential (primary) hypertension: Secondary | ICD-10-CM

## 2020-04-22 ENCOUNTER — Encounter: Payer: Self-pay | Admitting: Internal Medicine

## 2020-04-22 ENCOUNTER — Ambulatory Visit (INDEPENDENT_AMBULATORY_CARE_PROVIDER_SITE_OTHER): Payer: Medicare Other

## 2020-04-22 ENCOUNTER — Other Ambulatory Visit: Payer: Self-pay

## 2020-04-22 ENCOUNTER — Ambulatory Visit (INDEPENDENT_AMBULATORY_CARE_PROVIDER_SITE_OTHER): Payer: Medicare Other | Admitting: Internal Medicine

## 2020-04-22 VITALS — BP 130/80 | HR 87 | Temp 98.4°F | Ht 67.0 in | Wt 217.0 lb

## 2020-04-22 VITALS — BP 130/80 | HR 87 | Temp 98.4°F | Resp 16 | Ht 67.0 in | Wt 217.0 lb

## 2020-04-22 DIAGNOSIS — Z789 Other specified health status: Secondary | ICD-10-CM | POA: Diagnosis not present

## 2020-04-22 DIAGNOSIS — E118 Type 2 diabetes mellitus with unspecified complications: Secondary | ICD-10-CM

## 2020-04-22 DIAGNOSIS — Z0000 Encounter for general adult medical examination without abnormal findings: Secondary | ICD-10-CM | POA: Diagnosis not present

## 2020-04-22 DIAGNOSIS — I10 Essential (primary) hypertension: Secondary | ICD-10-CM

## 2020-04-22 DIAGNOSIS — J9611 Chronic respiratory failure with hypoxia: Secondary | ICD-10-CM | POA: Diagnosis not present

## 2020-04-22 LAB — POCT GLYCOSYLATED HEMOGLOBIN (HGB A1C): Hemoglobin A1C: 6.3 % — AB (ref 4.0–5.6)

## 2020-04-22 NOTE — Progress Notes (Signed)
Subjective:  Patient ID: [name], male    DOB: 08/15/1956  Age: 64 y.o. MRN: [id number]  CC: Diabetes  This visit occurred during the SARS-CoV-2 public health emergency.  Safety protocols were in place, including screening questions prior to the visit, additional usage of staff PPE, and extensive cleaning of exam room while observing appropriate contact time as indicated for disinfecting solutions.    HPI [name] presents for f/up - He is not taking [place] or bempedoic acid because they were too expensive.  He tells me his blood sugars have been well controlled.  He denies polys.  He is doing well on his current oxygen with no recent episodes of chest pain, cough, diaphoresis, fever, or chills.  Outpatient Medications Prior to Visit  Medication Sig Dispense Refill  . aspirin 81 MG EC tablet Take 81 mg by mouth daily.      . CVS D3 50 MCG (2000 UT) CAPS TAKE 2 CAPSULES (4,000 UNITS TOTAL) BY MOUTH DAILY. 180 capsule 1  . famotidine (PEPCID) 40 MG tablet Take 1 tablet (40 mg total) by mouth daily. 90 tablet 1  . fexofenadine (ALLEGRA) 180 MG tablet Take 180 mg by mouth daily as needed for allergies.     . hydrochlorothiazide (MICROZIDE) 12.5 MG capsule Take 1 capsule (12.5 mg total) by mouth daily. 90 capsule 1  . icosapent Ethyl (VASCEPA) 1 g capsule Take 2 capsules (2 g total) by mouth 2 (two) times daily. 360 capsule 1  . KLOR-CON M20 20 MEQ tablet TAKE 1 TABLET BY MOUTH TWICE A DAY 180 tablet 1  . metFORMIN (GLUCOPHAGE) 1000 MG tablet TAKE 1 TABLET (1,000 MG TOTAL) BY MOUTH 2 (TWO) TIMES DAILY WITH A MEAL. 180 tablet 1  . NON FORMULARY Oxygen continuous 1.5 liters with activity     . olmesartan (BENICAR) 40 MG tablet Take 1 tablet (40 mg total) by mouth daily. 90 tablet 1  . ONETOUCH VERIO test strip USE 1 PER DAY 100 each 2  . STIOLTO RESPIMAT 2.5-2.5 MCG/ACT AERS INHALE 2 PUFFS BY MOUTH INTO THE LUNGS DAILY 4 g 1  . Bempedoic Acid-Ezetimibe (NEXLIZET) 180-10 MG TABS Take  1 tablet by mouth daily. (Patient not taking: Reported on 04/22/2020) 70 tablet 0  . dapagliflozin propanediol (FARXIGA) 5 MG TABS tablet Take 5 mg by mouth daily before breakfast. (Patient not taking: Reported on 04/22/2020) 90 tablet 0   No facility-administered medications prior to visit.    ROS Review of Systems  Constitutional: Negative for appetite change, chills, diaphoresis, fatigue and fever.  HENT: Negative.  Negative for sore throat and trouble swallowing.   Eyes: Negative.   Respiratory: Positive for shortness of breath. Negative for cough, chest tightness and wheezing.   Cardiovascular: Negative for chest pain, palpitations and leg swelling.  Gastrointestinal: Negative for abdominal pain, constipation, diarrhea, nausea and vomiting.  Endocrine: Negative for polydipsia, polyphagia and polyuria.  Genitourinary: Negative.  Negative for difficulty urinating and dysuria.  Musculoskeletal: Negative.  Negative for arthralgias and myalgias.  Skin: Negative.  Negative for color change, pallor and rash.  Neurological: Negative.  Negative for dizziness, weakness, light-headedness and headaches.  Hematological: Negative for adenopathy. Does not bruise/bleed easily.  Psychiatric/Behavioral: Negative.     Objective:  BP 130/80 (BP Location: Left Arm, Patient Position: Sitting, Cuff Size: Normal)   Pulse 87   Temp 98.4 F (36.9 C) (Oral)   Resp 16   Ht 5\' 7"  (1.702 m)   Wt 217 lb (98.4  kg)   SpO2 95%   BMI 33.99 kg/m   BP Readings from Last 3 Encounters:  04/22/20 130/80  04/22/20 130/80  12/22/19 138/86    Wt Readings from Last 3 Encounters:  04/22/20 217 lb (98.4 kg)  04/22/20 217 lb (98.4 kg)  02/02/20 228 lb (103.4 kg)    Physical Exam Constitutional:      General: He is not in acute distress.    Appearance: He is ill-appearing (on O2). He is not toxic-appearing or diaphoretic.  HENT:     Nose: Nose normal.     Mouth/Throat:     Mouth: Mucous membranes are moist.    Eyes:     General: No scleral icterus.    Conjunctiva/sclera: Conjunctivae normal.  Cardiovascular:     Rate and Rhythm: Normal rate and regular rhythm.     Heart sounds: No murmur heard.   Pulmonary:     Effort: Pulmonary effort is normal.     Breath sounds: No stridor. No wheezing, rhonchi or rales.  Abdominal:     General: Abdomen is protuberant. Bowel sounds are normal. There is no distension.     Palpations: There is no hepatomegaly, splenomegaly or mass.     Tenderness: There is no abdominal tenderness.  Musculoskeletal:        General: Normal range of motion.     Cervical back: Neck supple.     Right lower leg: No edema.     Left lower leg: No edema.  Lymphadenopathy:     Cervical: No cervical adenopathy.  Skin:    General: Skin is warm and dry.     Coloration: Skin is not pale.  Neurological:     General: No focal deficit present.     Mental Status: He is alert.  Psychiatric:        Mood and Affect: Mood normal.        Behavior: Behavior normal.     Lab Results  Component Value Date   WBC 9.4 06/24/2019   HGB 14.7 06/24/2019   HCT 42.5 06/24/2019   PLT 282.0 06/24/2019   GLUCOSE 113 (H) 04/08/2020   CHOL 242 (H) 06/24/2019   TRIG 301.0 (H) 12/22/2019   HDL 36.10 (L) 06/24/2019   LDLDIRECT 177.0 06/24/2019   LDLCALC 35 01/03/2017   ALT 30 06/24/2019   AST 17 06/24/2019   NA 138 04/08/2020   K 4.1 04/08/2020   CL 104 04/08/2020   CREATININE 1.06 04/08/2020   BUN 15 04/08/2020   CO2 21 04/08/2020   TSH 2.00 06/24/2019   PSA 0.61 06/24/2019   INR 1.2 08/04/2008   HGBA1C 6.3 (A) 04/22/2020   MICROALBUR <0.7 06/24/2019    CT ANGIO CHEST AORTA W/CM & OR WO/CM  Result Date: 04/15/2020 CLINICAL DATA:  64 year old male with thoracic aortic aneurysm EXAM: CT ANGIOGRAPHY CHEST WITH CONTRAST TECHNIQUE: Multidetector CT imaging of the chest was performed using the standard protocol during bolus administration of intravenous contrast. Multiplanar CT image  reconstructions and MIPs were obtained to evaluate the vascular anatomy. CONTRAST:  OMNIPAQUE IOHEXOL 350 MG/ML SOLN COMPARISON:  04/02/2019, 12/25/2017, 12/19/2016, most remote 08/12/2008 FINDINGS: Cardiovascular: Heart: No cardiomegaly. No pericardial fluid/thickening. No significant coronary calcifications. Aorta: No aortic valve calcifications. Greatest estimated diameter of the thoracic aorta measures 3.6 cm. Mild atherosclerosis of the aortic arch. No dissection. No periaortic fluid. Branch vessels are patent. Distal thoracic aorta with mild atherosclerosis. Diameter at the hiatus measures 2.5 cm. Pulmonary arteries: No central, lobar, segmental,  or proximal subsegmental filling defects. Mediastinum/Nodes: Small lymph nodes of the mediastinum, unchanged from the prior. Unremarkable thoracic esophagus. Unremarkable thoracic inlet. Lungs/Pleura: Paraseptal and centrilobular emphysema. No septal thickening or confluent airspace disease. Central airways are clear. No pleural effusion or pneumothorax. Bronchiectasis. Upper Abdomen: No acute finding of the upper abdomen Musculoskeletal: No acute displaced fracture. Degenerative changes of the spine. Review of the MIP images confirms the above findings. IMPRESSION: Similar course caliber and configuration of the thoracic aorta, with greatest estimated diameter of the ascending aorta on today's CT is 3.6 cm. Prior greatest measurements estimated 4 cm, and annual imaging followup by CTA or MRA is reasonable. This recommendation follows 2010 ACCF/AHA/AATS/ACR/ASA/SCA/SCAI/SIR/STS/SVM Guidelines for the Diagnosis and Management of Patients with Thoracic Aortic Disease. Circulation. 2010; 121: V409-W119 Aortic Atherosclerosis (ICD10-I70.0) and Emphysema (ICD10-J43.9). Signed, Yvone Neu. Reyne Dumas, RPVI [facility] Radiology Electronically Signed   By: Gilmer Mor D.O.   On: 04/15/2020 16:02    Assessment & Plan:     Meliton was seen today for diabetes.  Diagnoses and all orders for this visit:  Essential hypertension, benign- His BP is well controlled.  Type II diabetes mellitus with manifestations (HCC)- His A1C is at 6.3%. will continue the current metformin dose. -     POCT glycosylated hemoglobin (Hb A1C)  Chronic respiratory failure with hypoxia (HCC)- he is doing well on the current O2 flow.   I have discontinued Aqil Goetting. Spurling's Nexlizet and [place]. I am also having him maintain his aspirin, NON FORMULARY, fexofenadine, OneTouch Verio, CVS D3, Stiolto Respimat, famotidine, icosapent Ethyl, olmesartan, hydrochlorothiazide, metFORMIN, and Klor-Con M20.  No orders of the defined types were placed in this encounter.    Follow-up: Return in about 6 months (around 10/23/2020).  Sanda Linger, MD

## 2020-04-22 NOTE — Patient Instructions (Signed)
Type 2 Diabetes Mellitus, Diagnosis, Adult Type 2 diabetes (type 2 diabetes mellitus) is a long-term (chronic) disease. In type 2 diabetes, one or both of these problems may be present:  The pancreas does not make enough of a hormone called insulin.  Cells in the body do not respond properly to insulin that the body makes (insulin resistance). Normally, insulin allows blood sugar (glucose) to enter cells in the body. The cells use glucose for energy. Insulin resistance or lack of insulin causes excess glucose to build up in the blood instead of going into cells. As a result, high blood glucose (hyperglycemia) develops. What increases the risk? The following factors may make you more likely to develop type 2 diabetes:  Having a family member with type 2 diabetes.  Being overweight or obese.  Having an inactive (sedentary) lifestyle.  Having been diagnosed with insulin resistance.  Having a history of prediabetes, gestational diabetes, or polycystic ovary syndrome (PCOS).  Being of American-Indian, African-American, Hispanic/Latino, or Asian/Pacific Islander descent. What are the signs or symptoms? In the early stage of this condition, you may not have symptoms. Symptoms develop slowly and may include:  Increased thirst (polydipsia).  Increased hunger(polyphagia).  Increased urination (polyuria).  Increased urination during the night (nocturia).  Unexplained weight loss.  Frequent infections that keep coming back (recurring).  Fatigue.  Weakness.  Vision changes, such as blurry vision.  Cuts or bruises that are slow to heal.  Tingling or numbness in the hands or feet.  Dark patches on the skin (acanthosis nigricans). How is this diagnosed? This condition is diagnosed based on your symptoms, your medical history, a physical exam, and your blood glucose level. Your blood glucose may be checked with one or more of the following blood tests:  A fasting blood glucose (FBG)  test. You will not be allowed to eat (you will fast) for 8 hours or longer before a blood sample is taken.  A random blood glucose test. This test checks blood glucose at any time of day regardless of when you ate.  An A1c (hemoglobin A1c) blood test. This test provides information about blood glucose control over the previous 2-3 months.  An oral glucose tolerance test (OGTT). This test measures your blood glucose at two times: ? After fasting. This is your baseline blood glucose level. ? Two hours after drinking a beverage that contains glucose. You may be diagnosed with type 2 diabetes if:  Your FBG level is 126 mg/dL (7.0 mmol/L) or higher.  Your random blood glucose level is 200 mg/dL (11.1 mmol/L) or higher.  Your A1c level is 6.5% or higher.  Your OGTT result is higher than 200 mg/dL (11.1 mmol/L). These blood tests may be repeated to confirm your diagnosis. How is this treated? Your treatment may be managed by a specialist called an endocrinologist. Type 2 diabetes may be treated by following instructions from your health care provider about:  Making diet and lifestyle changes. This may include: ? Following an individualized nutrition plan that is developed by a diet and nutrition specialist (registered dietitian). ? Exercising regularly. ? Finding ways to manage stress.  Checking your blood glucose level as often as told.  Taking diabetes medicines or insulin daily. This helps to keep your blood glucose levels in the healthy range. ? If you use insulin, you may need to adjust the dosage depending on how physically active you are and what foods you eat. Your health care provider will tell you how to adjust your dosage.    Taking medicines to help prevent complications from diabetes, such as: ? Aspirin. ? Medicine to lower cholesterol. ? Medicine to control blood pressure. Your health care provider will set individualized treatment goals for you. Your goals will be based on  your age, other medical conditions you have, and how you respond to diabetes treatment. Generally, the goal of treatment is to maintain the following blood glucose levels:  Before meals (preprandial): 80-130 mg/dL (4.4-7.2 mmol/L).  After meals (postprandial): below 180 mg/dL (10 mmol/L).  A1c level: less than 7%. Follow these instructions at home: Questions to ask your health care provider  Consider asking the following questions: ? Do I need to meet with a diabetes educator? ? Where can I find a support group for people with diabetes? ? What equipment will I need to manage my diabetes at home? ? What diabetes medicines do I need, and when should I take them? ? How often do I need to check my blood glucose? ? What number can I call if I have questions? ? When is my next appointment? General instructions  Take over-the-counter and prescription medicines only as told by your health care provider.  Keep all follow-up visits as told by your health care provider. This is important.  For more information about diabetes, visit: ? American Diabetes Association (ADA): www.diabetes.org ? American Association of Diabetes Educators (AADE): www.diabeteseducator.org Contact a health care provider if:  Your blood glucose is at or above 240 mg/dL (13.3 mmol/L) for 2 days in a row.  You have been sick or have had a fever for 2 days or longer, and you are not getting better.  You have any of the following problems for more than 6 hours: ? You cannot eat or drink. ? You have nausea and vomiting. ? You have diarrhea. Get help right away if:  Your blood glucose is lower than 54 mg/dL (3.0 mmol/L).  You become confused or you have trouble thinking clearly.  You have difficulty breathing.  You have moderate or large ketone levels in your urine. Summary  Type 2 diabetes (type 2 diabetes mellitus) is a long-term (chronic) disease. In type 2 diabetes, the pancreas does not make enough of a  hormone called insulin, or cells in the body do not respond properly to insulin that the body makes (insulin resistance).  This condition is treated by making diet and lifestyle changes and taking diabetes medicines or insulin.  Your health care provider will set individualized treatment goals for you. Your goals will be based on your age, other medical conditions you have, and how you respond to diabetes treatment.  Keep all follow-up visits as told by your health care provider. This is important. This information is not intended to replace advice given to you by your health care provider. Make sure you discuss any questions you have with your health care provider. Document Revised: 11/30/2017 Document Reviewed: 11/05/2015 Elsevier Patient Education  2020 [employer]  

## 2020-04-22 NOTE — Progress Notes (Signed)
Subjective:   [name] is a 64 y.o. male who presents for Medicare Annual/Subsequent preventive examination.  Review of Systems    No ROS. Medicare Wellness Visit Cardiac Risk Factors include: advanced age (>80mn, >>20women);diabetes mellitus;dyslipidemia;family history of premature cardiovascular disease;hypertension;male gender;obesity (BMI >30kg/m2)     Objective:    Today's Vitals   04/22/20 1224  BP: 130/80  Pulse: 87  Temp: 98.4 F (36.9 C)  SpO2: 95%  Weight: 217 lb (98.4 kg)  Height: _0  (1.702 m)  PainSc: 0-No pain   Body mass index is 33.99 kg/m.  Advanced Directives 04/22/2020 12/26/2018 11/26/2018 12/12/2017 11/12/2016 02/04/2016 07/16/2015  Does Patient Have a Medical Advance Directive? No No No No Yes Yes;No Yes  Type of Advance Directive - - - - HPress [place] will - [place] will  Does patient want to make changes to medical advance directive? - - - - - - No - Patient declined  [place] of [place]? - - - - Yes - Yes  Would patient like information on creating a medical advance directive? No - Patient declined Yes (ED - Information included in AVS) No - Patient declined Yes (ED - Information included in AVS) - Yes - Educational materials given -    Current Medications (verified) Outpatient Encounter Medications as of 04/22/2020  Medication Sig  . aspirin 81 MG EC tablet Take 81 mg by mouth daily.    . [facility] D3 50 MCG (2000 UT) CAPS TAKE 2 CAPSULES (4,000 UNITS TOTAL) BY MOUTH DAILY.  . famotidine (PEPCID) 40 MG tablet Take 1 tablet (40 mg total) by mouth daily.  . fexofenadine (ALLEGRA) 180 MG tablet Take 180 mg by mouth daily as needed for allergies.   . hydrochlorothiazide (MICROZIDE) 12.5 MG capsule Take 1 capsule (12.5 mg total) by mouth daily.  .Marland KitchenKLOR-CON M20 20 MEQ tablet TAKE 1 TABLET BY MOUTH TWICE A DAY  . metFORMIN (GLUCOPHAGE) 1000 MG tablet TAKE 1 TABLET (1,000 MG TOTAL) BY  MOUTH 2 (TWO) TIMES DAILY WITH A MEAL.  . NON FORMULARY Oxygen continuous 1.5 liters with activity   . olmesartan (BENICAR) 40 MG tablet Take 1 tablet (40 mg total) by mouth daily.  .Glory RosebushVERIO test strip USE 1 PER DAY  . STIOLTO RESPIMAT 2.5-2.5 MCG/ACT AERS INHALE 2 PUFFS BY MOUTH INTO THE LUNGS DAILY  . Bempedoic Acid-Ezetimibe (NEXLIZET) 180-10 MG TABS Take 1 tablet by mouth daily. (Patient not taking: Reported on 04/22/2020)  . dapagliflozin propanediol (FARXIGA) 5 MG TABS tablet Take 5 mg by mouth daily before breakfast. (Patient not taking: Reported on 04/22/2020)  . icosapent Ethyl (VASCEPA) 1 g capsule Take 2 capsules (2 g total) by mouth 2 (two) times daily.   No facility-administered encounter medications on file as of 04/22/2020.    Allergies (verified) Latex, Morphine, and Statins   History: Past Medical History:  Diagnosis Date  . [place]ch[place] obstructive pulmonary disease) [place]   -PFT 09/23/08 FEV1 2.89(84%), FVC 3.70 (78%), TLC 5.28 (78%), DLCO 64% mixed obstructive and  restrictive pattern.  Spirometry 03/11/09 FEV 3.35 (97%),FVC 4.10 (86%), FEV% 82. Steroid responsive pneumonitis 04/09- required mechanical ventilation during hospitalization - The following hospital test were negative: PCP,ANA,HIV,BAL. - ACE25,RF20,CRP 28,A1AT 263, ESR 109  . Depression   . Diabetes mellitus without complication ([place]   . Dyslipidemia   . Dyslipidemia   . GERD (gastroesophageal reflux disease)   . History of skin  cancer   . Hypertension    Echo 10/09 EF 60%  . ILD (interstitial lung disease) ([place])   . Panic attack    Past Surgical History:  Procedure Laterality Date  . BRONCHOSCOPY  10/09  . Ganglion cyst on foot    . HERNIA REPAIR     2 hernia  . Left shoulder surgery     Family History  Problem Relation Age of Onset  . Heart attack Father 14  . Diabetes Father   . Heart attack Brother   . Emphysema Brother   . Diabetes Brother    Social History    Socioeconomic History  . Marital status: Divorced    Spouse name: Not on file  . Number of children: 0  . Years of education: Not on file  . Highest education level: Not on file  Occupational History  . Occupation: Scientist, water quality: UNEMPLOYED  Tobacco Use  . Smoking status: Former Smoker    Packs/day: 1.00    Years: 16.00    Pack years: 16.00    Types: Cigarettes    Quit date: 10/17/2003    Years since quitting: 16.5  . Smokeless tobacco: Never Used  Vaping Use  . Vaping Use: Never used  Substance and Sexual Activity  . Alcohol use: No    Comment: Rare beer 3-4 times per year  . Drug use: No  . Sexual activity: Not Currently  Other Topics Concern  . Not on file  Social History Narrative   Divorced. No Children   Drug use-No   Regular exercise-yes   Lives alone   Social Determinants of Health   Financial Resource Strain: Low Risk   . Difficulty of Paying Living Expenses: Not hard at all  Food Insecurity: No Food Insecurity  . Worried About Charity fundraiser in the Last Year: Never true  . Ran Out of Food in the Last Year: Never true  Transportation Needs: No Transportation Needs  . Lack of Transportation (Medical): No  . Lack of Transportation (Non-Medical): No  Physical Activity: Sufficiently Active  . Days of Exercise per Week: 5 days  . Minutes of Exercise per Session: 30 min  Stress: No Stress Concern Present  . Feeling of Stress : Only a little  Social Connections: Socially Isolated  . Frequency of Communication with Friends and Family: More than three times a week  . Frequency of Social Gatherings with Friends and Family: Once a week  . Attends Religious Services: Never  . Active Member of Clubs or Organizations: No  . Attends Archivist Meetings: Never  . Marital Status: Widowed    Tobacco Counseling Counseling given: No   Clinical Intake:  Pre-visit preparation completed: Yes  Pain : No/denies pain Pain Score: 0-No  pain     BMI - recorded: 33.99 Nutritional Status: BMI > 30  Obese Nutritional Risks: Nausea/ vomitting/ diarrhea Diabetes: Yes CBG done?: No Did pt. bring in CBG monitor from home?: No  How often do you need to have someone help you when you read instructions, pamphlets, or other written materials from your doctor or pharmacy?: 1 - Never What is the last grade level you completed in school?: HSG  Diabetic? yes  Interpreter Needed?: No  Information entered by :: Tyde Lamison N. Javaria Knapke, LPN   Activities of Daily Living In your present state of health, do you have any difficulty performing the following activities: 04/22/2020  Hearing? N  Vision? N  Difficulty concentrating  or making decisions? N  Walking or climbing stairs? N  Dressing or bathing? N  Doing errands, shopping? N  Preparing Food and eating ? N  Using the Toilet? N  In the past six months, have you accidently leaked urine? N  Do you have problems with loss of bowel control? Y  Managing your Medications? N  Managing your Finances? N  Housekeeping or managing your Housekeeping? N  Some recent data might be hidden    Patient Care Team: [name], MD as PCP - General Chesley Mires, MD as Consulting Physician (Pulmonary Disease) End, Harrell Gave, MD as Consulting Physician (Cardiology)  Indicate any recent Medical Services you may have received from other than [facility] providers in the past year (date may be approximate).     Assessment:   This is a routine wellness examination for Joshu.  Hearing/Vision screen No exam data present  Dietary issues and exercise activities discussed: Current Exercise Habits: Home exercise routine, Type of exercise: walking (yard work, gardening), Time (Minutes): 30, Frequency (Times/Week): 5, Weekly Exercise (Minutes/Week): 150, Intensity: Mild, Exercise limited by: respiratory conditions(s)  Goals    . Patient Stated     Increase my physical exercise by walking at [employer],  [employer], and other places. Enjoy planting my garden and humming birds.       Depression Screen PHQ 2/9 Scores 04/22/2020 12/26/2018 12/12/2017 11/12/2016 02/04/2016 07/16/2015 07/14/2014  PHQ - 2 Score 0 3 2 0 0 0 0  PHQ- 9 Score - 7 6 - - - -    Fall Risk Fall Risk  04/22/2020 12/26/2018 12/14/2017 12/12/2017 11/12/2016  Falls in the past year? 0 0 No No No  Number falls in past yr: 0 0 - - -  Injury with Fall? 0 - - - -  Risk for fall due to : No Fall Risks Impaired balance/gait - - -  Follow up Falls evaluation completed;Education provided Falls prevention discussed - - -    Any stairs in or around the home? No  If so, are there any without handrails? No  Home free of loose throw rugs in walkways, pet beds, electrical cords, etc? Yes  Adequate lighting in your home to reduce risk of falls? Yes   ASSISTIVE DEVICES UTILIZED TO PREVENT FALLS:  Life alert? No  Use of a cane, walker or w/c? Yes  Grab bars in the bathroom? No  Shower chair or bench in shower? Yes  Elevated toilet seat or a handicapped toilet? No   TIMED UP AND GO:  Was the test performed? No .  Length of time to ambulate 10 feet: 0 sec.   Gait steady and fast with assistive device  Cognitive Function:     6CIT Screen 04/22/2020  What Year? 0 points  What month? 0 points  What time? 0 points  Count back from 20 0 points  Months in reverse 0 points  Repeat phrase 0 points  Total Score 0    Immunizations Immunization History  Administered Date(s) Administered  . Fluad Quad(high Dose 65+) 06/24/2019  . Influenza Split 07/05/2011, 07/16/2012  . Influenza Whole 07/21/2009, 08/22/2010  . Influenza,inj,Quad PF,6+ Mos 06/25/2013, 07/13/2014, 07/15/2015, 08/08/2016, 06/11/2017, 06/12/2018  . Moderna SARS-COVID-2 Vaccination 12/16/2019, 01/11/2020  . Pneumococcal Conjugate-13 06/25/2013  . Pneumococcal Polysaccharide-23 08/17/2008, 08/08/2016  . Td 10/16/2005  . Tdap 01/12/2016  . Zoster 05/08/2016    TDAP status: Up  to date Flu Vaccine status: Up to date Pneumococcal vaccine status: Up to date Covid-19 vaccine status:  Completed vaccines  Qualifies for Shingles Vaccine? Yes   Zostavax completed Yes   Shingrix Completed?: No.    Education has been provided regarding the importance of this vaccine. Patient has been advised to call insurance company to determine out of pocket expense if they have not yet received this vaccine. Advised may also receive vaccine at local pharmacy or Health Dept. Verbalized acceptance and understanding.  Screening Tests Health Maintenance  Topic Date Due  . COLONOSCOPY  02/14/2017  . OPHTHALMOLOGY EXAM  08/07/2019  . INFLUENZA VACCINE  05/16/2020  . FOOT EXAM  06/23/2020  . HEMOGLOBIN A1C  06/23/2020  . PNA vac Low Risk Adult (2 of 2 - PPSV23) 08/08/2021  . TETANUS/TDAP  01/11/2026  . COVID-19 Vaccine  Completed  . Hepatitis C Screening  Completed    Health Maintenance  Health Maintenance Due  Topic Date Due  . COLONOSCOPY  02/14/2017  . OPHTHALMOLOGY EXAM  08/07/2019    Colorectal cancer screening: Referral to GI placed 04/22/2020. Pt aware the office will call re: appt.  Lung Cancer Screening: (Low Dose CT Chest recommended if Age 83-80 years, 30 pack-year currently smoking OR have quit w/in 15years.) does not qualify.   Lung Cancer Screening Referral: no  Additional Screening:  Hepatitis C Screening: does qualify; Completed yes  Vision Screening: Recommended annual ophthalmology exams for early detection of glaucoma and other disorders of the eye. Is the patient up to date with their annual eye exam?  Yes  Who is the provider or what is the name of the office in which the patient attends annual eye exams? [facility] If pt is not established with a provider, would they like to be referred to a provider to establish care? No .   Dental Screening: Recommended annual dental exams for proper oral hygiene  Community Resource Referral / Chronic Care  Management: CRR required this visit?  No   CCM required this visit?  No      Plan:     I have personally reviewed and noted the following in the patient's chart:   . Medical and social history . Use of alcohol, tobacco or illicit drugs  . Current medications and supplements . Functional ability and status . Nutritional status . Physical activity . Advanced directives . List of other physicians . Hospitalizations, surgeries, and ER visits in previous 12 months . Vitals . Screenings to include cognitive, depression, and falls . Referrals and appointments  In addition, I have reviewed and discussed with patient certain preventive protocols, quality metrics, and best practice recommendations. A written personalized care plan for preventive services as well as general preventive health recommendations were provided to patient.     Sheral Flow, LPN   0/03/3015   Nurse Notes:  Patient has been in touch with Santina Evans to schedule his colonoscopy due to abnormal Cologuard results.

## 2020-04-22 NOTE — Patient Instructions (Signed)
[name] , Thank you for taking time to come for your Medicare Wellness Visit. I appreciate your ongoing commitment to your health goals. Please review the following plan we discussed and let me know if I can assist you in the future.   Screening recommendations/referrals: Colonoscopy: 02/15/2007; due every 10 years Recommended yearly ophthalmology/optometry visit for glaucoma screening and checkup Recommended yearly dental visit for hygiene and checkup  Vaccinations: Influenza vaccine: 06/24/2019 Pneumococcal vaccine: completed Tdap vaccine: 01/12/2016; due every 10 years Shingles vaccine: never done Covid-19: completed  Advanced directives: Advance directive discussed with you today. Even though you declined this today please call our office should you change your mind and we can give you the proper paperwork for you to fill out.  Conditions/risks identified: Please continue to do your personal lifestyle choices by: daily care of teeth and gums, regular physical activity (goal should be 5 days a week for 30 minutes), eat a healthy diet, avoid tobacco and drug use, limiting any alcohol intake, taking a low-dose aspirin (if not allergic or have been advised by your provider otherwise) and taking vitamins and minerals as recommended by your provider. Continue doing brain stimulating activities (puzzles, reading, adult coloring books, staying active) to keep memory sharp. Continue to eat heart healthy diet (full of fruits, vegetables, whole grains, lean protein, water--limit salt, fat, and sugar intake) and increase physical activity as tolerated.  Next appointment: Please schedule your next Medicare Wellness Visit with your Nurse Health Advisor in 1 year.  Preventive Care 66 Years and Older, Male Preventive care refers to lifestyle choices and visits with your health care provider that can promote health and wellness. What does preventive care include?  A yearly physical exam. This is also called  an annual well check.  Dental exams once or twice a year.  Routine eye exams. Ask your health care provider how often you should have your eyes checked.  Personal lifestyle choices, including:  Daily care of your teeth and gums.  Regular physical activity.  Eating a healthy diet.  Avoiding tobacco and drug use.  Limiting alcohol use.  Practicing safe sex.  Taking low doses of aspirin every day.  Taking vitamin and mineral supplements as recommended by your health care provider. What happens during an annual well check? The services and screenings done by your health care provider during your annual well check will depend on your age, overall health, lifestyle risk factors, and family history of disease. Counseling  Your health care provider may ask you questions about your:  Alcohol use.  Tobacco use.  Drug use.  Emotional well-being.  Home and relationship well-being.  Sexual activity.  Eating habits.  History of falls.  Memory and ability to understand (cognition).  Work and work Astronomer. Screening  You may have the following tests or measurements:  Height, weight, and BMI.  Blood pressure.  Lipid and cholesterol levels. These may be checked every 5 years, or more frequently if you are over 38 years old.  Skin check.  Lung cancer screening. You may have this screening every year starting at age 62 if you have a 30-pack-year history of smoking and currently smoke or have quit within the past 15 years.  Fecal occult blood test (FOBT) of the stool. You may have this test every year starting at age 26.  Flexible sigmoidoscopy or colonoscopy. You may have a sigmoidoscopy every 5 years or a colonoscopy every 10 years starting at age 56.  Prostate cancer screening. Recommendations will vary  depending on your family history and other risks.  Hepatitis C blood test.  Hepatitis B blood test.  Sexually transmitted disease (STD) testing.  Diabetes  screening. This is done by checking your blood sugar (glucose) after you have not eaten for a while (fasting). You may have this done every 1-3 years.  Abdominal aortic aneurysm (AAA) screening. You may need this if you are a current or former smoker.  Osteoporosis. You may be screened starting at age 62 if you are at high risk. Talk with your health care provider about your test results, treatment options, and if necessary, the need for more tests. Vaccines  Your health care provider may recommend certain vaccines, such as:  Influenza vaccine. This is recommended every year.  Tetanus, diphtheria, and acellular pertussis (Tdap, Td) vaccine. You may need a Td booster every 10 years.  Zoster vaccine. You may need this after age 45.  Pneumococcal 13-valent conjugate (PCV13) vaccine. One dose is recommended after age 46.  Pneumococcal polysaccharide (PPSV23) vaccine. One dose is recommended after age 21. Talk to your health care provider about which screenings and vaccines you need and how often you need them. This information is not intended to replace advice given to you by your health care provider. Make sure you discuss any questions you have with your health care provider. Document Released: 10/29/2015 Document Revised: 06/21/2016 Document Reviewed: 08/03/2015 Elsevier Interactive Patient Education  2017 [place] Prevention in the Home Falls can cause injuries. They can happen to people of all ages. There are many things you can do to make your home safe and to help prevent falls. What can I do on the outside of my home?  Regularly fix the edges of walkways and driveways and fix any cracks.  Remove anything that might make you trip as you walk through a door, such as a raised step or threshold.  Trim any bushes or trees on the path to your home.  Use bright outdoor lighting.  Clear any walking paths of anything that might make someone trip, such as rocks or  tools.  Regularly check to see if handrails are loose or broken. Make sure that both sides of any steps have handrails.  Any raised decks and porches should have guardrails on the edges.  Have any leaves, snow, or ice cleared regularly.  Use sand or salt on walking paths during winter.  Clean up any spills in your garage right away. This includes oil or grease spills. What can I do in the bathroom?  Use night lights.  Install grab bars by the toilet and in the tub and shower. Do not use towel bars as grab bars.  Use non-skid mats or decals in the tub or shower.  If you need to sit down in the shower, use a plastic, non-slip stool.  Keep the floor dry. Clean up any water that spills on the floor as soon as it happens.  Remove soap buildup in the tub or shower regularly.  Attach bath mats securely with double-sided non-slip rug tape.  Do not have throw rugs and other things on the floor that can make you trip. What can I do in the bedroom?  Use night lights.  Make sure that you have a light by your bed that is easy to reach.  Do not use any sheets or blankets that are too big for your bed. They should not hang down onto the floor.  Have a firm chair that has side  arms. You can use this for support while you get dressed.  Do not have throw rugs and other things on the floor that can make you trip. What can I do in the kitchen?  Clean up any spills right away.  Avoid walking on wet floors.  Keep items that you use a lot in easy-to-reach places.  If you need to reach something above you, use a strong step stool that has a grab bar.  Keep electrical cords out of the way.  Do not use floor polish or wax that makes floors slippery. If you must use wax, use non-skid floor wax.  Do not have throw rugs and other things on the floor that can make you trip. What can I do with my stairs?  Do not leave any items on the stairs.  Make sure that there are handrails on both  sides of the stairs and use them. Fix handrails that are broken or loose. Make sure that handrails are as long as the stairways.  Check any carpeting to make sure that it is firmly attached to the stairs. Fix any carpet that is loose or worn.  Avoid having throw rugs at the top or bottom of the stairs. If you do have throw rugs, attach them to the floor with carpet tape.  Make sure that you have a light switch at the top of the stairs and the bottom of the stairs. If you do not have them, ask someone to add them for you. What else can I do to help prevent falls?  Wear shoes that:  Do not have high heels.  Have rubber bottoms.  Are comfortable and fit you well.  Are closed at the toe. Do not wear sandals.  If you use a stepladder:  Make sure that it is fully opened. Do not climb a closed stepladder.  Make sure that both sides of the stepladder are locked into place.  Ask someone to hold it for you, if possible.  Clearly mark and make sure that you can see:  Any grab bars or handrails.  First and last steps.  Where the edge of each step is.  Use tools that help you move around (mobility aids) if they are needed. These include:  Canes.  Walkers.  Scooters.  Crutches.  Turn on the lights when you go into a dark area. Replace any light bulbs as soon as they burn out.  Set up your furniture so you have a clear path. Avoid moving your furniture around.  If any of your floors are uneven, fix them.  If there are any pets around you, be aware of where they are.  Review your medicines with your doctor. Some medicines can make you feel dizzy. This can increase your chance of falling. Ask your doctor what other things that you can do to help prevent falls. This information is not intended to replace advice given to you by your health care provider. Make sure you discuss any questions you have with your health care provider. Document Released: 07/29/2009 Document Revised:  03/09/2016 Document Reviewed: 11/06/2014 Elsevier Interactive Patient Education  2017 [employer].

## 2020-04-26 NOTE — Assessment & Plan Note (Signed)
He is not willing to take a statin 

## 2020-05-13 ENCOUNTER — Telehealth: Payer: Self-pay

## 2020-05-13 NOTE — Telephone Encounter (Signed)
Called pt to follow up and inform that GI sent a letter to schedule colonoscopy.   Pt stated that he would call their office.

## 2020-05-16 DIAGNOSIS — I10 Essential (primary) hypertension: Secondary | ICD-10-CM | POA: Diagnosis not present

## 2020-05-16 DIAGNOSIS — R0902 Hypoxemia: Secondary | ICD-10-CM | POA: Diagnosis not present

## 2020-05-16 DIAGNOSIS — J188 Other pneumonia, unspecified organism: Secondary | ICD-10-CM | POA: Diagnosis not present

## 2020-05-19 DIAGNOSIS — E1165 Type 2 diabetes mellitus with hyperglycemia: Secondary | ICD-10-CM | POA: Diagnosis not present

## 2020-05-19 LAB — HM DIABETES EYE EXAM

## 2020-06-03 ENCOUNTER — Encounter: Payer: Self-pay | Admitting: Internal Medicine

## 2020-06-10 ENCOUNTER — Other Ambulatory Visit: Payer: Self-pay | Admitting: Internal Medicine

## 2020-06-10 DIAGNOSIS — K2100 Gastro-esophageal reflux disease with esophagitis, without bleeding: Secondary | ICD-10-CM

## 2020-06-16 DIAGNOSIS — R0902 Hypoxemia: Secondary | ICD-10-CM | POA: Diagnosis not present

## 2020-06-16 DIAGNOSIS — J188 Other pneumonia, unspecified organism: Secondary | ICD-10-CM | POA: Diagnosis not present

## 2020-06-16 DIAGNOSIS — I10 Essential (primary) hypertension: Secondary | ICD-10-CM | POA: Diagnosis not present

## 2020-06-18 ENCOUNTER — Telehealth: Payer: Self-pay | Admitting: Internal Medicine

## 2020-06-18 NOTE — Progress Notes (Signed)
°  Chronic Care Management   Note  06/18/2020 Name: [name] MRN: [id number] DOB: Dec 08, 1955  [name] is a 64 y.o. year old male who is a primary care patient of Etta Grandchild, MD. I reached out to Lamonte Sakai by phone today in response to a referral sent by [name]'s PCP, Etta Grandchild, MD.   [name] was given information about Chronic Care Management services today including:  1. CCM service includes personalized support from designated clinical staff supervised by his physician, including individualized plan of care and coordination with other care providers 2. 24/7 contact phone numbers for assistance for urgent and routine care needs. 3. Service will only be billed when office clinical staff spend 20 minutes or more in a month to coordinate care. 4. Only one practitioner may furnish and bill the service in a calendar month. 5. The patient may stop CCM services at any time (effective at the end of the month) by phone call to the office staff.   Patient agreed to services and verbal consent obtained.   Follow up plan:   Lynnae January Upstream Scheduler

## 2020-06-20 ENCOUNTER — Other Ambulatory Visit: Payer: Self-pay | Admitting: Internal Medicine

## 2020-06-20 DIAGNOSIS — I10 Essential (primary) hypertension: Secondary | ICD-10-CM

## 2020-06-20 DIAGNOSIS — E118 Type 2 diabetes mellitus with unspecified complications: Secondary | ICD-10-CM

## 2020-07-16 DIAGNOSIS — I10 Essential (primary) hypertension: Secondary | ICD-10-CM | POA: Diagnosis not present

## 2020-07-16 DIAGNOSIS — R0902 Hypoxemia: Secondary | ICD-10-CM | POA: Diagnosis not present

## 2020-08-16 DIAGNOSIS — I10 Essential (primary) hypertension: Secondary | ICD-10-CM | POA: Diagnosis not present

## 2020-08-16 DIAGNOSIS — R0902 Hypoxemia: Secondary | ICD-10-CM | POA: Diagnosis not present

## 2020-08-23 ENCOUNTER — Telehealth: Payer: Self-pay | Admitting: Pharmacist

## 2020-08-23 NOTE — Progress Notes (Signed)
Chronic Care Management Pharmacy Assistant   Name: [name]  MRN: [id number] DOB: 1956/06/19  Reason for Encounter: Initial Questions for Pharmacist visit on 08/25/2020   PCP : Etta Grandchild, MD  Allergies:   Allergies  Allergen Reactions  . Latex   . Morphine   . Statins     REACTION: weakness, muscle pains    Medications: Outpatient Encounter Medications as of 08/23/2020  Medication Sig  . aspirin 81 MG EC tablet Take 81 mg by mouth daily.    . [facility] D3 50 MCG (2000 UT) CAPS TAKE 2 CAPSULES (4,000 UNITS TOTAL) BY MOUTH DAILY.  . famotidine (PEPCID) 40 MG tablet TAKE 1 TABLET BY MOUTH EVERY DAY  . fexofenadine (ALLEGRA) 180 MG tablet Take 180 mg by mouth daily as needed for allergies.   . hydrochlorothiazide (MICROZIDE) 12.5 MG capsule TAKE 1 CAPSULE BY MOUTH EVERY DAY  . icosapent Ethyl (VASCEPA) 1 g capsule Take 2 capsules (2 g total) by mouth 2 (two) times daily.  Marland Kitchen KLOR-CON M20 20 MEQ tablet TAKE 1 TABLET BY MOUTH TWICE A DAY  . metFORMIN (GLUCOPHAGE) 1000 MG tablet TAKE 1 TABLET (1,000 MG TOTAL) BY MOUTH 2 (TWO) TIMES DAILY WITH A MEAL.  . NON FORMULARY Oxygen continuous 1.5 liters with activity   . olmesartan (BENICAR) 40 MG tablet TAKE 1 TABLET BY MOUTH EVERY DAY  . ONETOUCH VERIO test strip USE 1 PER DAY  . STIOLTO RESPIMAT 2.5-2.5 MCG/ACT AERS INHALE 2 PUFFS BY MOUTH INTO THE LUNGS DAILY  . [DISCONTINUED] famotidine (PEPCID) 40 MG tablet Take 1 tablet (40 mg total) by mouth daily.   No facility-administered encounter medications on file as of 08/23/2020.    Current Diagnosis: Patient Active Problem List   Diagnosis Date Noted  . Heme positive stool 04/15/2020  . Statin intolerance 03/23/2020  . Vitamin D deficiency disease 06/12/2018  . GERD with esophagitis 12/12/2017  . Thoracic aortic aneurysm without rupture (HCC) 12/12/2017  . Screen for colon cancer 01/12/2016  . Routine general medical examination at a health care facility 07/16/2015  . Benign  prostatic hyperplasia 07/13/2014  . Type II diabetes mellitus with manifestations (HCC) 11/12/2013  . Pure hyperglyceridemia 07/09/2013  . Upper airway cough syndrome 02/06/2012  . Hyperlipidemia with target LDL less than 130 07/05/2011  . EMPHYSEMA 12/07/2008  . Essential hypertension, benign 08/26/2008  . INTERSTITIAL LUNG DISEASE 08/26/2008  . Respiratory failure with hypoxia (HCC) 08/26/2008    Goals Addressed   None     Follow-Up:  Pharmacist Review   Have you seen any other providers since your last visit? The patient has not seen any other providers  Any changes in your medications or health? The patient has not changes in medication  Any side effects from any medications? The patient has not side effects to any meds  Do you have an symptoms or problems not managed by your medications? The patient has no symptoms at this time  Any concerns about your health right now? Patient has something going on but just not sure or how to [place] it could be just being lonely not able to get out in the yard and do what he loves which is gardening  Has your provider asked that you check blood pressure, blood sugar, or follow special diet at home? Patient does check blood sugar every now and then not regularly  Do you get any type of exercise on a regular basis? The patient does not do any  exercise in the winter time because it hard to get out the house  Can you think of a goal you would like to reach for your health? The patient just want to stay as healthy as he can, he would like to get back to his gardening.  Do you have any problems getting your medications?  The patient has no problems getting his medications  Is there anything that you would like to discuss during the appointment? The patient states that his blood sugar is a little higher than he would like but he is doing well.  Please bring medications and supplements to appointment

## 2020-08-25 ENCOUNTER — Other Ambulatory Visit: Payer: Self-pay | Admitting: Internal Medicine

## 2020-08-25 ENCOUNTER — Ambulatory Visit: Payer: Medicare Other | Admitting: Pharmacist

## 2020-08-25 ENCOUNTER — Other Ambulatory Visit: Payer: Self-pay

## 2020-08-25 DIAGNOSIS — Z789 Other specified health status: Secondary | ICD-10-CM

## 2020-08-25 DIAGNOSIS — E785 Hyperlipidemia, unspecified: Secondary | ICD-10-CM

## 2020-08-25 DIAGNOSIS — J8410 Pulmonary fibrosis, unspecified: Secondary | ICD-10-CM

## 2020-08-25 DIAGNOSIS — E118 Type 2 diabetes mellitus with unspecified complications: Secondary | ICD-10-CM

## 2020-08-25 DIAGNOSIS — I10 Essential (primary) hypertension: Secondary | ICD-10-CM

## 2020-08-25 MED ORDER — EZETIMIBE 10 MG PO TABS: 10.0000 mg | ORAL_TABLET | Freq: Every day | ORAL | 3 refills | Status: DC

## 2020-08-25 NOTE — Patient Instructions (Addendum)
Visit Information  Phone number for Pharmacist: [phone]  Thank you for meeting with me to discuss your medications! I look forward to working with you to achieve your health care goals. Below is a summary of what we talked about during the visit:  Goals Addressed            This Visit's Progress   . Pharmacy Care Plan       CARE PLAN ENTRY (see longitudinal plan of care for additional care plan information)  Current Barriers:  . Chronic Disease Management support, education, and care coordination needs related to Hypertension, Hyperlipidemia, and Diabetes, Interstitial lung disease   Hypertension BP Readings from Last 3 Encounters:  04/22/20 130/80  04/22/20 130/80  12/22/19 138/86 .  Pharmacist Clinical Goal(s): o Over the next 30 days, patient will work with PharmD and providers to maintain BP goal <130/80 . Current regimen:  o Olmesartan 40 mg daily o HCTZ 12.5 mg daily . Interventions: o Discussed BP goals and benefits of medications for prevention of heart attack / stroke . Patient self care activities - Over the next 30 days, patient will: o Check BP weekly, document, and provide at future appointments o Ensure daily salt intake < 2300 mg/day  Hyperlipidemia Lab Results  Component Value Date/Time   LDLCALC 35 01/03/2017 11:22 AM   LDLDIRECT 177.0 06/24/2019 10:24 AM   TRIG 301.0 (H) 12/22/2019 11:20 AM .  Pharmacist Clinical Goal(s): o Over the next 30 days, patient will work with PharmD and providers to achieve LDL goal < 13 . Current regimen:  o OTC Krill oil daily . Interventions: o Discussed cholesterol goals and benefits of medications for prevention of heart attack / stroke o Discussed statin intolerance and history of success with Repatha, which is not affordable currently. o Recommend starting ezetimibe 10 mg daily . Patient self care activities - Over the next 30 days, patient will: o Trial ezetimibe 10 mg daily  Diabetes Lab Results   Component Value Date/Time   HGBA1C 6.3 (A) 04/22/2020 02:08 PM   HGBA1C 7.5 (H) 12/22/2019 11:20 AM   HGBA1C 6.6 (H) 06/24/2019 10:24 AM .  Pharmacist Clinical Goal(s): o Over the next 30 days, patient will work with PharmD and providers to maintain A1c goal <7% . Current regimen:  o Metformin 1000 mg twice a day . Interventions: o Discussed importance of maintaining sugars at goal to prevent complications of diabetes including kidney damage, retinal damage, and cardiovascular disease . Patient self care activities - Over the next 30 days, patient will: o Check blood sugar in the morning before eating or drinking, document, and provide at future appointments   Interstitial lung disease . Pharmacist Clinical Goal(s) o Over the next 30 days, patient will work with PharmD and providers to optimize therapy . Current regimen:  o Stiolto Respimat - 2 puffs daily . Interventions: o Discussed role of maintenance inhaler and importance of taking daily . Patient self care activities - Over the next 30 days, patient will: o Use Stiolto daily and contact pharmacist if cost is an issue  Medication management . Pharmacist Clinical Goal(s): o Over the next 30 days, patient will work with PharmD and providers to maintain optimal medication adherence . [facility] . Interventions o Comprehensive medication review performed. o Continue current medication management strategy . Patient self care activities - Over the next 30 days, patient will: o Focus on medication adherence by pill box o Take medications as prescribed o Report any questions or  concerns to PharmD and/or provider(s)  Initial goal documentation      [name] was given information about Chronic Care Management services today including:  1. CCM service includes personalized support from designated clinical staff supervised by his physician, including individualized plan of care and coordination with other care  providers 2. 24/7 contact phone numbers for assistance for urgent and routine care needs. 3. Standard insurance, coinsurance, copays and deductibles apply for chronic care management only during months in which we provide at least 20 minutes of these services. Most insurances cover these services at 100%, however patients may be responsible for any copay, coinsurance and/or deductible if applicable. This service may help you avoid the need for more expensive face-to-face services. 4. Only one practitioner may furnish and bill the service in a calendar month. 5. The patient may stop CCM services at any time (effective at the end of the month) by phone call to the office staff.  Patient agreed to services and verbal consent obtained.   Patient verbalizes understanding of instructions provided today.  Telephone follow up appointment with pharmacy team member scheduled for: 1 month  Al Corpus, PharmD, BCACP Clinical Pharmacist [facility] [phone]  Cholesterol Content in Foods Cholesterol is a waxy, fat-like substance that helps to carry fat in the blood. The body needs cholesterol in small amounts, but too much cholesterol can cause damage to the arteries and heart. Most people should eat less than 200 milligrams (mg) of cholesterol a day. Foods with cholesterol  Cholesterol is found in animal-based foods, such as meat, seafood, and dairy. Generally, low-fat dairy and lean meats have less cholesterol than full-fat dairy and fatty meats. The milligrams of cholesterol per serving (mg per serving) of common cholesterol-containing foods are listed below. Meat and other proteins  Egg -- one large whole egg has 186 mg.  Veal shank -- 4 oz has 141 mg.  Lean ground [place] (93% lean) -- 4 oz has 118 mg.  Fat-trimmed lamb loin -- 4 oz has 106 mg.  Lean ground beef (90% lean) -- 4 oz has 100 mg.  Lobster -- 3.5 oz has 90 mg.  Pork loin chops -- 4 oz has 86  mg.  Canned salmon -- 3.5 oz has 83 mg.  Fat-trimmed beef top loin -- 4 oz has 78 mg.  Frankfurter -- 1 frank (3.5 oz) has 77 mg.  Crab -- 3.5 oz has 71 mg.  Roasted chicken without skin, white meat -- 4 oz has 66 mg.  Light bologna -- 2 oz has 45 mg.  Deli-cut [place] -- 2 oz has 31 mg.  Canned tuna -- 3.5 oz has 31 mg.  Tomasa Blase -- 1 oz has 29 mg.  Oysters and mussels (raw) -- 3.5 oz has 25 mg.  Mackerel -- 1 oz has 22 mg.  Trout -- 1 oz has 20 mg.  Pork sausage -- 1 link (1 oz) has 17 mg.  Salmon -- 1 oz has 16 mg.  Tilapia -- 1 oz has 14 mg. Dairy  Soft-serve ice cream --  cup (4 oz) has 103 mg.  Whole-milk yogurt -- 1 cup (8 oz) has 29 mg.  Cheddar cheese -- 1 oz has 28 mg.  American cheese -- 1 oz has 28 mg.  Whole milk -- 1 cup (8 oz) has 23 mg.  2% milk -- 1 cup (8 oz) has 18 mg.  Cream cheese -- 1 tablespoon (Tbsp) has 15 mg.  Cottage cheese --  cup (4 oz) has 14 mg.  Low-fat (1%) milk -- 1 cup (8 oz) has 10 mg.  Sour cream -- 1 Tbsp has 8.5 mg.  Low-fat yogurt -- 1 cup (8 oz) has 8 mg.  Nonfat Greek yogurt -- 1 cup (8 oz) has 7 mg.  Half-and-half cream -- 1 Tbsp has 5 mg. Fats and oils  Cod liver oil -- 1 tablespoon (Tbsp) has 82 mg.  Butter -- 1 Tbsp has 15 mg.  Lard -- 1 Tbsp has 14 mg.  Bacon grease -- 1 Tbsp has 14 mg.  Mayonnaise -- 1 Tbsp has 5-10 mg.  Margarine -- 1 Tbsp has 3-10 mg. Exact amounts of cholesterol in these foods may vary depending on specific ingredients and brands. Foods without cholesterol Most plant-based foods do not have cholesterol unless you combine them with a food that has cholesterol. Foods without cholesterol include:  Grains and cereals.  Vegetables.  Fruits.  Vegetable oils, such as olive, canola, and sunflower oil.  Legumes, such as peas, beans, and lentils.  Nuts and seeds.  Egg whites. Summary  The body needs cholesterol in small amounts, but too much cholesterol can cause damage to  the arteries and heart.  Most people should eat less than 200 milligrams (mg) of cholesterol a day. This information is not intended to replace advice given to you by your health care provider. Make sure you discuss any questions you have with your health care provider. Document Revised: 09/14/2017 Document Reviewed: 05/29/2017 Elsevier Patient Education  2020 [employer].

## 2020-08-25 NOTE — Chronic Care Management (AMB) (Signed)
Chronic Care Management Pharmacy  Name: [name]  MRN: [id number] DOB: 06/07/1956   Chief Complaint/ HPI  [name],  64 y.o. , male presents for their Initial CCM visit with the clinical pharmacist via telephone due to COVID-19 Pandemic.  PCP : Eric Lima, MD Patient Care Team: Eric Lima, MD as PCP - General Eric Mires, MD as Consulting Physician (Pulmonary Disease) End, Eric Gave, MD as Consulting Physician (Cardiology) [name], [facility] as Pharmacist (Pharmacist)  Their chronic conditions include: Hypertension, Hyperlipidemia, Diabetes, GERD and BPH, Interstitial lung disease; aortic aneurysm  Born in [place], has lived in [place] for about 13 years. Disabled since 2010 because of lung disease. Now on regular retirement. He grew up in foster care since 64 years old.    Office Visits: 04/22/20 Dr [name] OV: chronic f/u, pt not taking Farxiga or Nexlizet due to cost. No med changes.  Consult Visit: None in previous 6 months  Allergies  Allergen Reactions  . Latex   . Morphine   . Statins     REACTION: weakness, muscle pains   Medications: Outpatient Encounter Medications as of 08/25/2020  Medication Sig  . aspirin 81 MG EC tablet Take 81 mg by mouth daily.    . [facility] D3 50 MCG (2000 UT) CAPS TAKE 2 CAPSULES (4,000 UNITS TOTAL) BY MOUTH DAILY.  . famotidine (PEPCID) 40 MG tablet TAKE 1 TABLET BY MOUTH EVERY DAY  . fexofenadine (ALLEGRA) 180 MG tablet Take 180 mg by mouth daily as needed for allergies.   . hydrochlorothiazide (MICROZIDE) 12.5 MG capsule TAKE 1 CAPSULE BY MOUTH EVERY DAY  . KLOR-CON M20 20 MEQ tablet TAKE 1 TABLET BY MOUTH TWICE A DAY  . metFORMIN (GLUCOPHAGE) 1000 MG tablet TAKE 1 TABLET (1,000 MG TOTAL) BY MOUTH 2 (TWO) TIMES DAILY WITH A MEAL.  . NON FORMULARY Oxygen continuous 1.5 liters with activity   . olmesartan (BENICAR) 40 MG tablet TAKE 1 TABLET BY MOUTH EVERY DAY  . ONETOUCH VERIO test strip USE 1 PER DAY  . STIOLTO  RESPIMAT 2.5-2.5 MCG/ACT AERS INHALE 2 PUFFS BY MOUTH INTO THE LUNGS DAILY  . icosapent Ethyl (VASCEPA) 1 g capsule Take 2 capsules (2 g total) by mouth 2 (two) times daily. (Patient not taking: Reported on 08/25/2020)  . [DISCONTINUED] famotidine (PEPCID) 40 MG tablet Take 1 tablet (40 mg total) by mouth daily.   No facility-administered encounter medications on file as of 08/25/2020.    Wt Readings from Last 3 Encounters:  04/22/20 217 lb (98.4 kg)  04/22/20 217 lb (98.4 kg)  02/02/20 228 lb (103.4 kg)    Current Diagnosis/Assessment:    Goals Addressed            This Visit's Progress   . Pharmacy Care Plan       CARE PLAN ENTRY (see longitudinal plan of care for additional care plan information)  Current Barriers:  . Chronic Disease Management support, education, and care coordination needs related to Hypertension, Hyperlipidemia, and Diabetes, Interstitial lung disease   Hypertension BP Readings from Last 3 Encounters:  04/22/20 130/80  04/22/20 130/80  12/22/19 138/86 .  Pharmacist Clinical Goal(s): o Over the next 30 days, patient will work with PharmD and providers to maintain BP goal <130/80 . Current regimen:  o Olmesartan 40 mg daily o HCTZ 12.5 mg daily . Interventions: o Discussed BP goals and benefits of medications for prevention of heart attack / stroke . Patient self care activities -  Over the next 30 days, patient will: o Check BP weekly, document, and provide at future appointments o Ensure daily salt intake < 2300 mg/day  Hyperlipidemia Lab Results  Component Value Date/Time   LDLCALC 35 01/03/2017 11:22 AM   LDLDIRECT 177.0 06/24/2019 10:24 AM   TRIG 301.0 (H) 12/22/2019 11:20 AM .  Pharmacist Clinical Goal(s): o Over the next 30 days, patient will work with PharmD and providers to achieve LDL goal < 13 . Current regimen:  o OTC Krill oil daily . Interventions: o Discussed cholesterol goals and benefits of medications for prevention of  heart attack / stroke o Discussed statin intolerance and history of success with Repatha, which is not affordable currently. o Recommend starting ezetimibe 10 mg daily . Patient self care activities - Over the next 30 days, patient will: o Trial ezetimibe 10 mg daily  Diabetes Lab Results  Component Value Date/Time   HGBA1C 6.3 (A) 04/22/2020 02:08 PM   HGBA1C 7.5 (H) 12/22/2019 11:20 AM   HGBA1C 6.6 (H) 06/24/2019 10:24 AM .  Pharmacist Clinical Goal(s): o Over the next 30 days, patient will work with PharmD and providers to maintain A1c goal <7% . Current regimen:  o Metformin 1000 mg twice a day . Interventions: o Discussed importance of maintaining sugars at goal to prevent complications of diabetes including kidney damage, retinal damage, and cardiovascular disease . Patient self care activities - Over the next 30 days, patient will: o Check blood sugar in the morning before eating or drinking, document, and provide at future appointments   Interstitial lung disease . Pharmacist Clinical Goal(s) o Over the next 30 days, patient will work with PharmD and providers to optimize therapy . Current regimen:  o Stiolto Respimat - 2 puffs daily . Interventions: o Discussed role of maintenance inhaler and importance of taking daily . Patient self care activities - Over the next 30 days, patient will: o Use Stiolto daily and contact pharmacist if cost is an issue  Medication management . Pharmacist Clinical Goal(s): o Over the next 30 days, patient will work with PharmD and providers to maintain optimal medication adherence . [facility] . Interventions o Comprehensive medication review performed. o Continue current medication management strategy . Patient self care activities - Over the next 30 days, patient will: o Focus on medication adherence by pill box o Take medications as prescribed o Report any questions or concerns to PharmD and/or provider(s)  Initial goal  documentation       Hypertension   BP goal is:  <130/80  Office blood pressures are  BP Readings from Last 3 Encounters:  04/22/20 130/80  04/22/20 130/80  12/22/19 138/86   Lab Results  Component Value Date   CREATININE 1.06 04/08/2020   BUN 15 04/08/2020   GFR 69.83 04/08/2020   GFRNONAA >60 08/19/2008   GFRAA  08/19/2008    >60        The eGFR has been calculated using the MDRD equation. This calculation has not been validated in all clinical   NA 138 04/08/2020   K 4.1 04/08/2020   CALCIUM 9.9 04/08/2020   CO2 21 04/08/2020   Patient checks BP at home several times per month Patient home BP readings are ranging: 114/88, 122/97,   Patient has failed these meds in the past: n/a Patient is currently controlled on the following medications:  . Olmesartan 40 mg daily . HCTZ 12.5 mg daily  We discussed diet and exercise extensively; BP goals; benefits of  medications; pt denies side effects  Plan  Continue current medications and control with diet and exercise   Hyperlipidemia   LDL goal 30% reduction (<130) Statin intolerant  Last lipids Lab Results  Component Value Date   CHOL 242 (H) 06/24/2019   HDL 36.10 (L) 06/24/2019   LDLCALC 35 01/03/2017   LDLDIRECT 177.0 06/24/2019   TRIG 301.0 (H) 12/22/2019   CHOLHDL 7 06/24/2019   Hepatic Function Latest Ref Rng & Units 06/24/2019 01/03/2017 11/08/2016  Total Protein 6.0 - 8.3 g/dL 6.7 6.5 6.9  Albumin 3.5 - 5.2 g/dL 4.5 4.5 4.6  AST 0 - 37 U/L _0 ALT 0 - 53 U/L 30 35 33  Alk Phosphatase 39 - 117 U/L 81 80 69  Total Bilirubin 0.2 - 1.2 mg/dL 0.4 0.4 0.5  Bilirubin, Direct 0.0 - 0.3 mg/dL 0.1 0.14 -    The 10-year ASCVD risk score Mikey Bussing DC Jr., et al., 2013) is: 37.7%   Values used to calculate the score:     Age: 63 years     Sex: Male     Is Non-Hispanic African American: No     Diabetic: Yes     Tobacco smoker: No     Systolic Blood Pressure: 154 mmHg     Is BP treated: Yes     HDL  Cholesterol: 36.1 mg/dL     Total Cholesterol: 242 mg/dL   Patient has failed these meds in past: Praluent, Nexlizet, Repatha, ezetimibe, rosuvastatin TIW, atorvastatin Patient is currently uncontrolled on the following medications:  . Aspirin 81 mg daily . OTC Krill Oil   We discussed:  diet and exercise extensively; Cholesterol goals; benefits of medication for ASCVD risk reduction; pt reports he tolerated Repatha well for a year, but could no longer obtain get it for free so it was stopped a few years ago; he does not remember what happened when he took ezetimibe except it was costly at the time; now ezetimibe is generic and covered by insurance;   Plan  Continue control with diet and exercise  Recommend trial of ezetimibe 10 mg daily If not tolerated, recommend to restart Repatha in 2022 through [place] or [employer] program  Diabetes   A1c goal <7%  Recent Relevant Labs: Lab Results  Component Value Date/Time   HGBA1C 6.3 (A) 04/22/2020 02:08 PM   HGBA1C 7.5 (H) 12/22/2019 11:20 AM   HGBA1C 6.6 (H) 06/24/2019 10:24 AM   GFR 69.83 04/08/2020 04:06 PM   GFR 72.25 12/22/2019 11:20 AM   MICROALBUR <0.7 06/24/2019 10:24 AM   MICROALBUR <0.7 12/12/2017 01:43 PM    Last diabetic Eye exam:  Lab Results  Component Value Date/Time   HMDIABEYEEXA No Retinopathy 05/19/2020 12:00 AM    Last diabetic Foot exam:  Lab Results  Component Value Date/Time   HMDIABFOOTEX done 11/23/2010 12:00 AM    Checking BG: Rarely  Recent FBG Readings: 160s after coffee w/ milk  Patient has failed these meds in past: n/a Patient is currently controlled on the following medications: Marland Kitchen Metformin 1000 mg BID . Test strips  We discussed: diet and exercise extensively; Discussed importance of maintaining sugars at goal to prevent complications of diabetes including kidney damage, retinal damage, and cardiovascular disease  Pt gardens/exercises more in summer, A1c is usually better in  summer  Plan  Continue current medications and control with diet and exercise   Interstitial Lung Disease / Emphysema   Follows with Dr Halford Chessman  Last spirometry score:  07/01/2018 -FEV1 119% predicted -FEV1/FVC 0.80 -FEV1 % change: 0%   Lab Results  Component Value Date/Time   EOSPCT 3.6 06/24/2019 10:24 AM   EOSABS 0.3 06/24/2019 10:24 AM   Patient has failed these meds in past: n/a Patient is currently uncontrolled on the following medications:  . Stiolto Respimat 2 puffs daily (Dr Halford Chessman) . O2 1.5 L w/ activity  Using maintenance inhaler regularly? No Frequency of rescue inhaler use:  never  We discussed:  Pt reports he takes Stiolto as needed; it has not been refilled in over 6 months and he has about 1/2 of the doses available in current inhaler; discussed importance of daily use of maintenance inhalers to assess efficacy  Plan  Continue current medications  Recommend to use Stiolto daily to determine efficacy; PAP available if it is effective  GERD   Patient has failed these meds in past: rantitidine Patient is currently controlled on the following medications:  . Famotidine 40 mg daily PM . Tums PRN  We discussed:  Patient is satisfied with current regimen and denies issues  Plan  Continue current medications and control with diet and exercise  Health Maintenance   Patient is currently controlled on the following medications:  Marland Kitchen Vitamin D 2000 IU - 2 cap daily . Klor Con 20 mEq BID . Fexofenadine 180 mg daily PRN  We discussed:  Patient is satisfied with current regimen and denies issues  Plan  Continue current medications  Vaccines   Reviewed and discussed patient's vaccination history.    Immunization History  Administered Date(s) Administered  . Fluad Quad(high Dose 65+) 06/24/2019  . Influenza Split 07/05/2011, 07/16/2012  . Influenza Whole 07/21/2009, 08/22/2010  . Influenza,inj,Quad PF,6+ Mos 06/25/2013, 07/13/2014, 07/15/2015, 08/08/2016,  06/11/2017, 06/12/2018  . Moderna SARS-COVID-2 Vaccination 12/16/2019, 01/11/2020  . Pneumococcal Conjugate-13 06/25/2013  . Pneumococcal Polysaccharide-23 08/17/2008, 08/08/2016  . Td 10/16/2005  . Tdap 01/12/2016  . Zoster 05/08/2016   [facility]  Plan  Up to date on vaccines  Medication Management   [facility] Uses pill box? Yes Pt endorses 90% compliance  We discussed: Current pharmacy is preferred with insurance plan and patient is satisfied with pharmacy services  Plan  Continue current medication management strategy    Follow up: 1 month phone visit  Charlene Brooke, PharmD, BCACP Clinical Pharmacist [facility] at [facility] [phone]

## 2020-09-14 NOTE — Telephone Encounter (Signed)
Reviewed chart and insurance data for medication adherence. Patient does not have any gaps in adherence and is not in danger of failing any Medicare adherence measures. No further action required. ° °

## 2020-09-15 DIAGNOSIS — R0902 Hypoxemia: Secondary | ICD-10-CM | POA: Diagnosis not present

## 2020-09-15 DIAGNOSIS — I10 Essential (primary) hypertension: Secondary | ICD-10-CM | POA: Diagnosis not present

## 2020-09-23 ENCOUNTER — Other Ambulatory Visit: Payer: Self-pay

## 2020-09-23 ENCOUNTER — Ambulatory Visit: Payer: Medicare Other | Admitting: Pharmacist

## 2020-09-23 DIAGNOSIS — J8410 Pulmonary fibrosis, unspecified: Secondary | ICD-10-CM

## 2020-09-23 DIAGNOSIS — E785 Hyperlipidemia, unspecified: Secondary | ICD-10-CM

## 2020-09-23 DIAGNOSIS — I10 Essential (primary) hypertension: Secondary | ICD-10-CM

## 2020-09-23 DIAGNOSIS — E118 Type 2 diabetes mellitus with unspecified complications: Secondary | ICD-10-CM

## 2020-09-23 NOTE — Patient Instructions (Signed)
Visit Information  Phone number for Pharmacist: [phone]  Goals Addressed            This Visit's Progress   . Pharmacy Care Plan       CARE PLAN ENTRY (see longitudinal plan of care for additional care plan information)  Current Barriers:  . Chronic Disease Management support, education, and care coordination needs related to Hypertension, Hyperlipidemia, and Diabetes, Interstitial lung disease   Hypertension BP Readings from Last 3 Encounters:  04/22/20 130/80  04/22/20 130/80  12/22/19 138/86 .  Pharmacist Clinical Goal(s): o Over the next 90 days, patient will work with PharmD and providers to maintain BP goal <130/80 . Current regimen:  o Olmesartan 40 mg daily o HCTZ 12.5 mg daily . Interventions: o Discussed BP goals and benefits of medications for prevention of heart attack / stroke . Patient self care activities - Over the next 90 days, patient will: o Check BP weekly, document, and provide at future appointments o Ensure daily salt intake < 2300 mg/day  Hyperlipidemia Lab Results  Component Value Date/Time   LDLCALC 35 01/03/2017 11:22 AM   LDLDIRECT 177.0 06/24/2019 10:24 AM   TRIG 301.0 (H) 12/22/2019 11:20 AM .  Pharmacist Clinical Goal(s): o Over the next 90 days, patient will work with PharmD and providers to achieve LDL goal < 13 . Current regimen:  o OTC Krill oil daily o Ezetimibe 10 mg daily . Interventions: o Discussed cholesterol goals and benefits of medications for prevention of heart attack / stroke o Discussed statin intolerance and history of success with Repatha, which is not affordable currently. o Initiated ezetimibe 10 mg daily . Patient self care activities - Over the next 90 days, patient will: o Continue current medications o Follow up with PCP for repeat lipid panel  Diabetes Lab Results  Component Value Date/Time   HGBA1C 6.3 (A) 04/22/2020 02:08 PM   HGBA1C 7.5 (H) 12/22/2019 11:20 AM   HGBA1C 6.6 (H) 06/24/2019 10:24 AM  .  Pharmacist Clinical Goal(s): o Over the next 90 days, patient will work with PharmD and providers to maintain A1c goal <7% . Current regimen:  o Metformin 1000 mg twice a day . Interventions: o Discussed importance of maintaining sugars at goal to prevent complications of diabetes including kidney damage, retinal damage, and cardiovascular disease . Patient self care activities - Over the next 90 days, patient will: o Check blood sugar in the morning before eating or drinking, document, and provide at future appointments  Interstitial lung disease . Pharmacist Clinical Goal(s) o Over the next 90 days, patient will work with PharmD and providers to optimize therapy . Current regimen:  o Stiolto Respimat - 2 puffs daily . Interventions: o Discussed role of maintenance inhaler and importance of taking daily . Patient self care activities - Over the next 90 days, patient will: o Use Stiolto daily and contact pharmacist if cost is an issue  Medication management . Pharmacist Clinical Goal(s): o Over the next 90 days, patient will work with PharmD and providers to maintain optimal medication adherence . [facility] . Interventions o Comprehensive medication review performed. o Continue current medication management strategy . Patient self care activities - Over the next 90 days, patient will: o Focus on medication adherence by pill box o Take medications as prescribed o Report any questions or concerns to PharmD and/or provider(s)  Please see past updates related to this goal by clicking on the "Past Updates" button in the selected goal  The patient verbalized understanding of instructions, educational materials, and care plan provided today and declined offer to receive copy of patient instructions, educational materials, and care plan.  Telephone follow up appointment with pharmacy team member scheduled for: 3 months  Al Corpus, PharmD, [facility] Clinical  Pharmacist [facility] at [facility] [phone]

## 2020-09-23 NOTE — Chronic Care Management (AMB) (Signed)
Chronic Care Management Pharmacy  Name: LAQUINN SHIPPY  MRN: [id number] DOB: [birth year]   Chief Complaint/ HPI  Hessie Dibble,  64 y.o. , male presents for their Follow-Up CCM visit with the clinical pharmacist via telephone.  PCP : [name], MD Patient Care Team: [name], MD as PCP - General Chesley Mires, MD as Consulting Physician (Pulmonary Disease) End, Harrell Gave, MD as Consulting Physician (Cardiology) Charlton Haws, [facility] as Pharmacist (Pharmacist)  Their chronic conditions include: Hypertension, Hyperlipidemia, Diabetes, GERD and BPH, Interstitial lung disease; aortic aneurysm  Born in [place], has lived in [place] for about 13 years. Disabled since 2010 because of lung disease. Now on regular retirement. He grew up in foster care since 64 years old.    Office Visits: 04/22/20 Dr Ronnald Ramp OV: chronic f/u, pt not taking Farxiga or Nexlizet due to cost. No med changes.  Consult Visit: None in previous 6 months  Allergies  Allergen Reactions  . Latex   . Morphine   . Statins     REACTION: weakness, muscle pains   Medications: Outpatient Encounter Medications as of 09/23/2020  Medication Sig  . aspirin 81 MG EC tablet Take 81 mg by mouth daily.    . CVS D3 50 MCG (2000 UT) CAPS TAKE 2 CAPSULES (4,000 UNITS TOTAL) BY MOUTH DAILY.  Marland Kitchen ezetimibe (ZETIA) 10 MG tablet Take 1 tablet (10 mg total) by mouth daily.  . famotidine (PEPCID) 40 MG tablet TAKE 1 TABLET BY MOUTH EVERY DAY  . fexofenadine (ALLEGRA) 180 MG tablet Take 180 mg by mouth daily as needed for allergies.   . hydrochlorothiazide (MICROZIDE) 12.5 MG capsule TAKE 1 CAPSULE BY MOUTH EVERY DAY  . icosapent Ethyl (VASCEPA) 1 g capsule Take 2 capsules (2 g total) by mouth 2 (two) times daily. (Patient not taking: Reported on 08/25/2020)  . KLOR-CON M20 20 MEQ tablet TAKE 1 TABLET BY MOUTH TWICE A DAY  . metFORMIN (GLUCOPHAGE) 1000 MG tablet TAKE 1 TABLET (1,000 MG TOTAL) BY MOUTH 2 (TWO) TIMES DAILY  WITH A MEAL.  . NON FORMULARY Oxygen continuous 1.5 liters with activity   . olmesartan (BENICAR) 40 MG tablet TAKE 1 TABLET BY MOUTH EVERY DAY  . ONETOUCH VERIO test strip USE 1 PER DAY  . STIOLTO RESPIMAT 2.5-2.5 MCG/ACT AERS INHALE 2 PUFFS BY MOUTH INTO THE LUNGS DAILY  . [DISCONTINUED] famotidine (PEPCID) 40 MG tablet Take 1 tablet (40 mg total) by mouth daily.   No facility-administered encounter medications on file as of 09/23/2020.    Wt Readings from Last 3 Encounters:  04/22/20 217 lb (98.4 kg)  04/22/20 217 lb (98.4 kg)  02/02/20 228 lb (103.4 kg)    Current Diagnosis/Assessment:    Goals Addressed            This Visit's Progress   . Pharmacy Care Plan       CARE PLAN ENTRY (see longitudinal plan of care for additional care plan information)  Current Barriers:  . Chronic Disease Management support, education, and care coordination needs related to Hypertension, Hyperlipidemia, and Diabetes, Interstitial lung disease   Hypertension BP Readings from Last 3 Encounters:  04/22/20 130/80  04/22/20 130/80  12/22/19 138/86 .  Pharmacist Clinical Goal(s): o Over the next 90 days, patient will work with PharmD and providers to maintain BP goal <130/80 . Current regimen:  o Olmesartan 40 mg daily o HCTZ 12.5 mg daily . Interventions: o Discussed BP goals and benefits of medications for  prevention of heart attack / stroke . Patient self care activities - Over the next 90 days, patient will: o Check BP weekly, document, and provide at future appointments o Ensure daily salt intake < 2300 mg/day  Hyperlipidemia Lab Results  Component Value Date/Time   LDLCALC 35 01/03/2017 11:22 AM   LDLDIRECT 177.0 06/24/2019 10:24 AM   TRIG 301.0 (H) 12/22/2019 11:20 AM .  Pharmacist Clinical Goal(s): o Over the next 90 days, patient will work with PharmD and providers to achieve LDL goal < 13 . Current regimen:  o OTC Krill oil daily o Ezetimibe 10 mg  daily . Interventions: o Discussed cholesterol goals and benefits of medications for prevention of heart attack / stroke o Discussed statin intolerance and history of success with Repatha, which is not affordable currently. o Initiated ezetimibe 10 mg daily . Patient self care activities - Over the next 90 days, patient will: o Continue current medications o Follow up with PCP for repeat lipid panel  Diabetes Lab Results  Component Value Date/Time   HGBA1C 6.3 (A) 04/22/2020 02:08 PM   HGBA1C 7.5 (H) 12/22/2019 11:20 AM   HGBA1C 6.6 (H) 06/24/2019 10:24 AM .  Pharmacist Clinical Goal(s): o Over the next 90 days, patient will work with PharmD and providers to maintain A1c goal <7% . Current regimen:  o Metformin 1000 mg twice a day . Interventions: o Discussed importance of maintaining sugars at goal to prevent complications of diabetes including kidney damage, retinal damage, and cardiovascular disease . Patient self care activities - Over the next 90 days, patient will: o Check blood sugar in the morning before eating or drinking, document, and provide at future appointments  Interstitial lung disease . Pharmacist Clinical Goal(s) o Over the next 90 days, patient will work with PharmD and providers to optimize therapy . Current regimen:  o Stiolto Respimat - 2 puffs daily . Interventions: o Discussed role of maintenance inhaler and importance of taking daily . Patient self care activities - Over the next 90 days, patient will: o Use Stiolto daily and contact pharmacist if cost is an issue  Medication management . Pharmacist Clinical Goal(s): o Over the next 90 days, patient will work with PharmD and providers to maintain optimal medication adherence . [facility] . Interventions o Comprehensive medication review performed. o Continue current medication management strategy . Patient self care activities - Over the next 90 days, patient will: o Focus on medication  adherence by pill box o Take medications as prescribed o Report any questions or concerns to PharmD and/or provider(s)  Please see past updates related to this goal by clicking on the "Past Updates" button in the selected goal        Hypertension   BP goal is:  <130/80  Office blood pressures are  BP Readings from Last 3 Encounters:  04/22/20 130/80  04/22/20 130/80  12/22/19 138/86   Lab Results  Component Value Date   CREATININE 1.06 04/08/2020   BUN 15 04/08/2020   GFR 69.83 04/08/2020   GFRNONAA >60 08/19/2008   GFRAA  08/19/2008    >60        The eGFR has been calculated using the MDRD equation. This calculation has not been validated in all clinical   NA 138 04/08/2020   K 4.1 04/08/2020   CALCIUM 9.9 04/08/2020   CO2 21 04/08/2020   Patient checks BP at home several times per month Patient home BP readings are ranging: 114/88, 122/97,  Patient has failed these meds in the past: n/a Patient is currently controlled on the following medications:  . Olmesartan 40 mg daily . HCTZ 12.5 mg daily  We discussed diet and exercise extensively; BP goals; benefits of medications; pt denies side effects  Plan  Continue current medications and control with diet and exercise   Hyperlipidemia   LDL goal 30% reduction (<130) Statin intolerant  Last lipids Lab Results  Component Value Date   CHOL 242 (H) 06/24/2019   HDL 36.10 (L) 06/24/2019   LDLCALC 35 01/03/2017   LDLDIRECT 177.0 06/24/2019   TRIG 301.0 (H) 12/22/2019   CHOLHDL 7 06/24/2019   Hepatic Function Latest Ref Rng & Units 06/24/2019 01/03/2017 11/08/2016  Total Protein 6.0 - 8.3 g/dL 6.7 6.5 6.9  Albumin 3.5 - 5.2 g/dL 4.5 4.5 4.6  AST 0 - 37 U/L _0 ALT 0 - 53 U/L 30 35 33  Alk Phosphatase 39 - 117 U/L 81 80 69  Total Bilirubin 0.2 - 1.2 mg/dL 0.4 0.4 0.5  Bilirubin, Direct 0.0 - 0.3 mg/dL 0.1 0.14 -    The 10-year ASCVD risk score Mikey Bussing DC Jr., et al., 2013) is: 37.7%   Values used to  calculate the score:     Age: 44 years     Sex: Male     Is Non-Hispanic African American: No     Diabetic: Yes     Tobacco smoker: No     Systolic Blood Pressure: 267 mmHg     Is BP treated: Yes     HDL Cholesterol: 36.1 mg/dL     Total Cholesterol: 242 mg/dL   Patient has failed these meds in past: Praluent, Nexlizet, Repatha ($$), rosuvastatin TIW, atorvastatin Patient is currently uncontrolled on the following medications:  . Ezetimibe 10 mg daily . Aspirin 81 mg daily . OTC Krill Oil   We discussed:  diet and exercise extensively; pt has been taking ezetimibe about 4 weeks, denies side effects or issues. Pt has f/u with PCP next month, plan to re-check lipid panel then   Plan  Continue control with diet and exercise   Diabetes   A1c goal <7%  Recent Relevant Labs: Lab Results  Component Value Date/Time   HGBA1C 6.3 (A) 04/22/2020 02:08 PM   HGBA1C 7.5 (H) 12/22/2019 11:20 AM   HGBA1C 6.6 (H) 06/24/2019 10:24 AM   GFR 69.83 04/08/2020 04:06 PM   GFR 72.25 12/22/2019 11:20 AM   MICROALBUR <0.7 06/24/2019 10:24 AM   MICROALBUR <0.7 12/12/2017 01:43 PM    Last diabetic Eye exam:  Lab Results  Component Value Date/Time   HMDIABEYEEXA No Retinopathy 05/19/2020 12:00 AM    Last diabetic Foot exam:  Lab Results  Component Value Date/Time   HMDIABFOOTEX done 11/23/2010 12:00 AM    Checking BG: Rarely  Recent FBG Readings: 160s after coffee w/ milk  Patient has failed these meds in past: n/a Patient is currently controlled on the following medications: Marland Kitchen Metformin 1000 mg BID . Test strips  We discussed: diet and exercise extensively; Discussed importance of maintaining sugars at goal to prevent complications of diabetes including kidney damage, retinal damage, and cardiovascular disease  Pt gardens/exercises more in summer, A1c is usually better in summer  Plan  Continue current medications and control with diet and exercise   Interstitial Lung Disease /  Emphysema   Follows with Dr Halford Chessman  Last spirometry score: 07/01/2018 -FEV1 119% predicted -FEV1/FVC 0.80 -FEV1 % change: 0%  Lab  Results  Component Value Date/Time   EOSPCT 3.6 06/24/2019 10:24 AM   EOSABS 0.3 06/24/2019 10:24 AM   Patient has failed these meds in past: n/a Patient is currently uncontrolled on the following medications:  . Stiolto Respimat 2 puffs daily (Dr Halford Chessman) . O2 1.5 L w/ activity  Using maintenance inhaler regularly? No Frequency of rescue inhaler use:  never  We discussed:  Pt has been taking Stiolto daily for the past month -he is not sure if he has noticed any improvement in breathing; he states he is coughing up more phlegm in the mornings  Plan  Continue current medications   Medication Management   [facility] Uses pill box? Yes Pt endorses 90% compliance  We discussed: Current pharmacy is preferred with insurance plan and patient is satisfied with pharmacy services  Plan  Continue current medication management strategy    Follow up: 3 month phone visit  Charlene Brooke, PharmD, BCACP Clinical Pharmacist [facility] [phone]

## 2020-10-13 ENCOUNTER — Other Ambulatory Visit: Payer: Self-pay | Admitting: Internal Medicine

## 2020-10-13 DIAGNOSIS — E876 Hypokalemia: Secondary | ICD-10-CM

## 2020-10-13 DIAGNOSIS — I10 Essential (primary) hypertension: Secondary | ICD-10-CM

## 2020-10-16 DIAGNOSIS — I10 Essential (primary) hypertension: Secondary | ICD-10-CM | POA: Diagnosis not present

## 2020-10-16 DIAGNOSIS — R0902 Hypoxemia: Secondary | ICD-10-CM | POA: Diagnosis not present

## 2020-10-25 ENCOUNTER — Other Ambulatory Visit: Payer: Self-pay

## 2020-10-26 ENCOUNTER — Encounter: Payer: Self-pay | Admitting: Internal Medicine

## 2020-10-26 ENCOUNTER — Ambulatory Visit (INDEPENDENT_AMBULATORY_CARE_PROVIDER_SITE_OTHER): Payer: Medicare Other | Admitting: Internal Medicine

## 2020-10-26 VITALS — BP 128/84 | HR 86 | Temp 98.6°F | Resp 16 | Ht 67.0 in | Wt 227.0 lb

## 2020-10-26 DIAGNOSIS — Z0000 Encounter for general adult medical examination without abnormal findings: Secondary | ICD-10-CM

## 2020-10-26 DIAGNOSIS — I10 Essential (primary) hypertension: Secondary | ICD-10-CM

## 2020-10-26 DIAGNOSIS — E785 Hyperlipidemia, unspecified: Secondary | ICD-10-CM

## 2020-10-26 DIAGNOSIS — N3943 Post-void dribbling: Secondary | ICD-10-CM

## 2020-10-26 DIAGNOSIS — Z789 Other specified health status: Secondary | ICD-10-CM

## 2020-10-26 DIAGNOSIS — N401 Enlarged prostate with lower urinary tract symptoms: Secondary | ICD-10-CM | POA: Diagnosis not present

## 2020-10-26 DIAGNOSIS — B351 Tinea unguium: Secondary | ICD-10-CM | POA: Insufficient documentation

## 2020-10-26 DIAGNOSIS — E118 Type 2 diabetes mellitus with unspecified complications: Secondary | ICD-10-CM

## 2020-10-26 DIAGNOSIS — Z0001 Encounter for general adult medical examination with abnormal findings: Secondary | ICD-10-CM

## 2020-10-26 LAB — CBC WITH DIFFERENTIAL/PLATELET
Basophils Absolute: 0.2 10*3/uL — ABNORMAL HIGH (ref 0.0–0.1)
Basophils Relative: 2 % (ref 0.0–3.0)
Eosinophils Absolute: 0.2 10*3/uL (ref 0.0–0.7)
Eosinophils Relative: 2.5 % (ref 0.0–5.0)
HCT: 43.3 % (ref 39.0–52.0)
Hemoglobin: 14.9 g/dL (ref 13.0–17.0)
Lymphocytes Relative: 21 % (ref 12.0–46.0)
Lymphs Abs: 1.8 10*3/uL (ref 0.7–4.0)
MCHC: 34.4 g/dL (ref 30.0–36.0)
MCV: 88.9 fl (ref 78.0–100.0)
Monocytes Absolute: 0.9 10*3/uL (ref 0.1–1.0)
Monocytes Relative: 10.8 % (ref 3.0–12.0)
Neutro Abs: 5.6 10*3/uL (ref 1.4–7.7)
Neutrophils Relative %: 63.7 % (ref 43.0–77.0)
Platelets: 260 10*3/uL (ref 150.0–400.0)
RBC: 4.87 Mil/uL (ref 4.22–5.81)
RDW: 13.6 % (ref 11.5–15.5)
WBC: 8.8 10*3/uL (ref 4.0–10.5)

## 2020-10-26 LAB — HEPATIC FUNCTION PANEL
ALT: 52 U/L (ref 0–53)
AST: 25 U/L (ref 0–37)
Albumin: 4.6 g/dL (ref 3.5–5.2)
Alkaline Phosphatase: 82 U/L (ref 39–117)
Bilirubin, Direct: 0.1 mg/dL (ref 0.0–0.3)
Total Bilirubin: 0.5 mg/dL (ref 0.2–1.2)
Total Protein: 6.6 g/dL (ref 6.0–8.3)

## 2020-10-26 LAB — URINALYSIS, ROUTINE W REFLEX MICROSCOPIC
Bilirubin Urine: NEGATIVE
Hgb urine dipstick: NEGATIVE
Ketones, ur: NEGATIVE
Leukocytes,Ua: NEGATIVE
Nitrite: NEGATIVE
RBC / HPF: NONE SEEN (ref 0–?)
Specific Gravity, Urine: 1.025 (ref 1.000–1.030)
Total Protein, Urine: NEGATIVE
Urine Glucose: NEGATIVE
Urobilinogen, UA: 0.2 (ref 0.0–1.0)
pH: 5 (ref 5.0–8.0)

## 2020-10-26 LAB — BASIC METABOLIC PANEL
BUN: 12 mg/dL (ref 6–23)
CO2: 27 mEq/L (ref 19–32)
Calcium: 9.9 mg/dL (ref 8.4–10.5)
Chloride: 100 mEq/L (ref 96–112)
Creatinine, Ser: 0.99 mg/dL (ref 0.40–1.50)
GFR: 79.19 mL/min (ref >60.00)
Glucose, Bld: 162 mg/dL — ABNORMAL HIGH (ref 70–99)
Potassium: 4.8 mEq/L (ref 3.5–5.1)
Sodium: 134 mEq/L — ABNORMAL LOW (ref 135–145)

## 2020-10-26 LAB — MICROALBUMIN / CREATININE URINE RATIO
Creatinine,U: 78.1 mg/dL
Microalb Creat Ratio: 0.9 mg/g (ref 0.0–30.0)
Microalb, Ur: <0.7 mg/dL (ref 0.0–1.9)

## 2020-10-26 LAB — TSH: TSH: 2.62 u[IU]/mL (ref 0.35–4.50)

## 2020-10-26 LAB — HEMOGLOBIN A1C: Hgb A1c MFr Bld: 8.4 % — ABNORMAL HIGH (ref 4.6–6.5)

## 2020-10-26 LAB — LIPID PANEL
Cholesterol: 196 mg/dL (ref 0–200)
HDL: 38.4 mg/dL — ABNORMAL LOW (ref >39.00)
NonHDL: 157.68
Total CHOL/HDL Ratio: 5
Triglycerides: 263 mg/dL — ABNORMAL HIGH (ref 0.0–149.0)
VLDL: 52.6 mg/dL — ABNORMAL HIGH (ref 0.0–40.0)

## 2020-10-26 LAB — LDL CHOLESTEROL, DIRECT: Direct LDL: 126 mg/dL

## 2020-10-26 LAB — PSA: PSA: 0.52 ng/mL (ref 0.10–4.00)

## 2020-10-26 NOTE — Patient Instructions (Signed)

## 2020-10-26 NOTE — Progress Notes (Signed)
Subjective:  Patient ID: [name], male    DOB: [birth year]  Age: 65 y.o. MRN: [id number]  CC: Annual Exam, Hypertension, Hyperlipidemia, and Diabetes  This visit occurred during the SARS-CoV-2 public health emergency.  Safety protocols were in place, including screening questions prior to the visit, additional usage of staff PPE, and extensive cleaning of exam room while observing appropriate contact time as indicated for disinfecting solutions.    HPI DAMIEN BATTY presents for a CPX.  1.  He complains of a several month history of discomfort in his right great toenail with an abnormal appearance.  2. He complains of weight gain.  He has not been monitoring his blood sugar.  He denies polys.  3. He tells me his blood pressure has been well controlled.  He has his baseline level of shortness of breath but denies DOE, diaphoresis, dizziness, lightheadedness, or edema.  He has chest pain at rest that he describes as a soreness.  Outpatient Medications Prior to Visit  Medication Sig Dispense Refill   aspirin 81 MG EC tablet Take 81 mg by mouth daily.     CVS D3 50 MCG (2000 UT) CAPS TAKE 2 CAPSULES (4,000 UNITS TOTAL) BY MOUTH DAILY. 180 capsule 1   ezetimibe (ZETIA) 10 MG tablet Take 1 tablet (10 mg total) by mouth daily. 90 tablet 3   famotidine (PEPCID) 40 MG tablet TAKE 1 TABLET BY MOUTH EVERY DAY 90 tablet 1   fexofenadine (ALLEGRA) 180 MG tablet Take 180 mg by mouth daily as needed for allergies.     hydrochlorothiazide (MICROZIDE) 12.5 MG capsule TAKE 1 CAPSULE BY MOUTH EVERY DAY 90 capsule 1   KLOR-CON M20 20 MEQ tablet TAKE 1 TABLET BY MOUTH TWICE A DAY 180 tablet 1   metFORMIN (GLUCOPHAGE) 1000 MG tablet TAKE 1 TABLET (1,000 MG TOTAL) BY MOUTH 2 (TWO) TIMES DAILY WITH A MEAL. 180 tablet 1   NON FORMULARY Oxygen continuous 1.5 liters with activity     olmesartan (BENICAR) 40 MG tablet TAKE 1 TABLET BY MOUTH EVERY DAY 90 tablet 1   ONETOUCH VERIO test strip  USE 1 PER DAY 100 each 2   STIOLTO RESPIMAT 2.5-2.5 MCG/ACT AERS INHALE 2 PUFFS BY MOUTH INTO THE LUNGS DAILY 4 g 1   icosapent Ethyl (VASCEPA) 1 g capsule Take 2 capsules (2 g total) by mouth 2 (two) times daily. 360 capsule 1   No facility-administered medications prior to visit.    ROS Review of Systems  Constitutional: Positive for unexpected weight change (weight gain). Negative for appetite change, chills, diaphoresis and fatigue.  Eyes: Negative.   Respiratory: Positive for shortness of breath. Negative for cough, chest tightness and wheezing.   Cardiovascular: Positive for chest pain. Negative for palpitations and leg swelling.       Intermittent chest soreness  Gastrointestinal: Negative for abdominal pain, constipation, diarrhea, nausea and vomiting.  Endocrine: Negative.  Negative for polydipsia, polyphagia and polyuria.  Genitourinary: Negative.  Negative for difficulty urinating, dysuria and hematuria.  Musculoskeletal: Negative.  Negative for myalgias.       Right great toenail is painful  Skin: Negative.  Negative for color change, pallor and rash.  Neurological: Negative.  Negative for dizziness, light-headedness and headaches.  Hematological: Negative for adenopathy. Does not bruise/bleed easily.  Psychiatric/Behavioral: Negative.     Objective:  BP 128/84    Pulse 86    Temp 98.6 F (37 C) (Oral)    Resp 16  Ht 5\' 7"  (1.702 m)    Wt 227 lb (103 kg)    SpO2 95%    BMI 35.55 kg/m   BP Readings from Last 3 Encounters:  10/26/20 128/84  04/22/20 130/80  04/22/20 130/80    Wt Readings from Last 3 Encounters:  10/26/20 227 lb (103 kg)  04/22/20 217 lb (98.4 kg)  04/22/20 217 lb (98.4 kg)    Physical Exam Vitals reviewed.  Constitutional:      General: He is not in acute distress.    Appearance: He is ill-appearing (conrinuous 02). He is not toxic-appearing or diaphoretic.  HENT:     Nose: Nose normal.     Mouth/Throat:     Mouth: Mucous membranes are  moist.  Eyes:     General: No scleral icterus.    Conjunctiva/sclera: Conjunctivae normal.  Cardiovascular:     Rate and Rhythm: Normal rate.     Pulses:          Dorsalis pedis pulses are 1+ on the right side and 1+ on the left side.       Posterior tibial pulses are 1+ on the right side and 1+ on the left side.     Heart sounds: No murmur heard.     Comments: EKG - NSR, 83 bpm Normal EKG Pulmonary:     Effort: Pulmonary effort is normal.     Breath sounds: No stridor. Examination of the right-lower field reveals rales. Examination of the left-lower field reveals rales. Rales present. No wheezing or rhonchi.  Abdominal:     General: Abdomen is protuberant. Bowel sounds are normal. There is no distension.     Palpations: Abdomen is soft. There is no hepatomegaly, splenomegaly or mass.     Tenderness: There is no abdominal tenderness. There is no guarding.     Hernia: No hernia is present.  Genitourinary:    Pubic Area: No rash.      Penis: Normal and circumcised. No lesions.      Testes: Normal.        Right: Mass not present.        Left: Mass not present.     Epididymis:     Right: Normal. Not inflamed or enlarged. No mass.     Left: Not inflamed or enlarged. No mass.     Prostate: Normal. Not enlarged, not tender and no nodules present.     Rectum: Normal. Guaiac result negative. No mass, tenderness, anal fissure, external hemorrhoid or internal hemorrhoid. Normal anal tone.  Musculoskeletal:        General: Normal range of motion.     Cervical back: Neck supple.     Right lower leg: No edema.     Left lower leg: No edema.  Feet:     Right foot:     Skin integrity: Skin integrity normal.     Toenail Condition: Right toenails are abnormally thick. Fungal disease present.    Left foot:     Skin integrity: Skin integrity normal.     Toenail Condition: Left toenails are normal.  Lymphadenopathy:     Cervical: No cervical adenopathy.  Skin:    General: Skin is warm and  dry.     Coloration: Skin is not pale.  Neurological:     General: No focal deficit present.     Mental Status: He is alert.  Psychiatric:        Mood and Affect: Mood normal.  Behavior: Behavior normal.     Lab Results  Component Value Date   WBC 8.8 10/26/2020   HGB 14.9 10/26/2020   HCT 43.3 10/26/2020   PLT 260.0 10/26/2020   GLUCOSE 162 (H) 10/26/2020   CHOL 196 10/26/2020   TRIG 263.0 (H) 10/26/2020   HDL 38.40 (L) 10/26/2020   LDLDIRECT 126.0 10/26/2020   LDLCALC 35 01/03/2017   ALT 52 10/26/2020   AST 25 10/26/2020   NA 134 (L) 10/26/2020   K 4.8 10/26/2020   CL 100 10/26/2020   CREATININE 0.99 10/26/2020   BUN 12 10/26/2020   CO2 27 10/26/2020   TSH 2.62 10/26/2020   PSA 0.52 10/26/2020   INR 1.2 08/04/2008   HGBA1C 8.4 (H) 10/26/2020   MICROALBUR <0.7 10/26/2020    CT ANGIO CHEST AORTA W/CM & OR WO/CM  Result Date: 04/15/2020 CLINICAL DATA:  65 year old male with thoracic aortic aneurysm EXAM: CT ANGIOGRAPHY CHEST WITH CONTRAST TECHNIQUE: Multidetector CT imaging of the chest was performed using the standard protocol during bolus administration of intravenous contrast. Multiplanar CT image reconstructions and MIPs were obtained to evaluate the vascular anatomy. CONTRAST:  OMNIPAQUE IOHEXOL 350 MG/ML SOLN COMPARISON:  04/02/2019, 12/25/2017, 12/19/2016, most remote 08/12/2008 FINDINGS: Cardiovascular: Heart: No cardiomegaly. No pericardial fluid/thickening. No significant coronary calcifications. Aorta: No aortic valve calcifications. Greatest estimated diameter of the thoracic aorta measures 3.6 cm. Mild atherosclerosis of the aortic arch. No dissection. No periaortic fluid. Branch vessels are patent. Distal thoracic aorta with mild atherosclerosis. Diameter at the hiatus measures 2.5 cm. Pulmonary arteries: No central, lobar, segmental, or proximal subsegmental filling defects. Mediastinum/Nodes: Small lymph nodes of the mediastinum, unchanged from the  prior. Unremarkable thoracic esophagus. Unremarkable thoracic inlet. Lungs/Pleura: Paraseptal and centrilobular emphysema. No septal thickening or confluent airspace disease. Central airways are clear. No pleural effusion or pneumothorax. Bronchiectasis. Upper Abdomen: No acute finding of the upper abdomen Musculoskeletal: No acute displaced fracture. Degenerative changes of the spine. Review of the MIP images confirms the above findings. IMPRESSION: Similar course caliber and configuration of the thoracic aorta, with greatest estimated diameter of the ascending aorta on today's CT is 3.6 cm. Prior greatest measurements estimated 4 cm, and annual imaging followup by CTA or MRA is reasonable. This recommendation follows 2010 ACCF/AHA/AATS/ACR/ASA/SCA/SCAI/SIR/STS/SVM Guidelines for the Diagnosis and Management of Patients with Thoracic Aortic Disease. Circulation. 2010; 121: H702-O378 Aortic Atherosclerosis (ICD10-I70.0) and Emphysema (ICD10-J43.9). Signed, Yvone Neu. Reyne Dumas, RPVI [facility] Radiology Electronically Signed   By: Gilmer Mor D.O.   On: 04/15/2020 16:02    Assessment & Plan:   Bobbi was seen today for annual exam, hypertension, hyperlipidemia and diabetes.  Diagnoses and all orders for this visit:  Essential hypertension, benign- His blood pressure is well controlled and his EKG is reassuring.  Will continue the current antihypertensives. -     EKG 12-Lead -     CBC with Differential/Platelet; Future -     Basic metabolic panel; Future -     TSH; Future -     TSH -     Basic metabolic panel -     CBC with Differential/Platelet  Type II diabetes mellitus with manifestations (HCC)- His A1c is up to 8.4% and he has dilutional hyponatremia.  I recommended that he had basal insulin and a GLP-1 agonist to the metformin. -     Basic metabolic panel; Future -     Hemoglobin A1c; Future -     Microalbumin / creatinine  urine ratio;  Future -     HM Diabetes Foot Exam -     Microalbumin / creatinine urine ratio -     Hemoglobin A1c -     Basic metabolic panel -     Insulin Glargine-Lixisenatide (SOLIQUA) 100-33 UNT-MCG/ML SOPN; Inject 20 Units into the skin daily. -     Continuous Blood Gluc Sensor (FREESTYLE LIBRE 2 SENSOR) MISC; 1 Act by Does not apply route daily. -     Continuous Blood Gluc Receiver (FREESTYLE LIBRE 2 READER) DEVI; 1 Act by Does not apply route daily. -     Insulin Pen Needle 32G X 6 MM MISC; 1 Act by Does not apply route daily. -     Amb Referral to Nutrition and Diabetic E -     [facility]  Benign prostatic hyperplasia with post-void dribbling- His PSA is normal which is a reassuring sign that he does not have prostate cancer. -     Urinalysis, Routine w reflex microscopic; Future -     PSA; Future -     PSA -     Urinalysis, Routine w reflex microscopic  Hyperlipidemia with target LDL less than 130- He is not willing to take a statin. -     Lipid panel; Future -     Hepatic function panel; Future -     TSH; Future -     TSH -     Hepatic function panel -     Lipid panel  Statin intolerance  Onychomycosis of right great toe -     Hepatic function panel; Future -     Hepatic function panel -     fluconazole (DIFLUCAN) 200 MG tablet; Take 1 tablet (200 mg total) by mouth once a week.  Encounter for general adult medical examination with abnormal findings- Exam completed, labs reviewed, vaccines reviewed and updated, cancer screenings Liborio Nixonaddressed-she is not willing to get a colonoscopy at this time, patient education material was given.  Other orders -     LDL cholesterol, direct   I have discontinued Darel HongLuther S. Wierman's icosapent Ethyl. I am also having him start on Soliqua, fluconazole, FreeStyle Libre 2 Sensor, Franklin ResourcesFreeStyle Libre 2 Reader, and Insulin Pen Needle. Additionally, I am having him maintain his aspirin, NON FORMULARY, fexofenadine, OneTouch Verio, CVS D3,  Stiolto Respimat, famotidine, olmesartan, hydrochlorothiazide, metFORMIN, ezetimibe, and Klor-Con M20.  Meds ordered this encounter  Medications   Insulin Glargine-Lixisenatide (SOLIQUA) 100-33 UNT-MCG/ML SOPN    Sig: Inject 20 Units into the skin daily.    Dispense:  9 mL    Refill:  1   fluconazole (DIFLUCAN) 200 MG tablet    Sig: Take 1 tablet (200 mg total) by mouth once a week.    Dispense:  12 tablet    Refill:  0   Continuous Blood Gluc Sensor (FREESTYLE LIBRE 2 SENSOR) MISC    Sig: 1 Act by Does not apply route daily.    Dispense:  2 each    Refill:  5   Continuous Blood Gluc Receiver (FREESTYLE LIBRE 2 READER) DEVI    Sig: 1 Act by Does not apply route daily.    Dispense:  2 each    Refill:  5   Insulin Pen Needle 32G X 6 MM MISC    Sig: 1 Act by Does not apply route daily.    Dispense:  100 each    Refill:  1     Follow-up: Return in  about 6 months (around 04/25/2021).  Sanda Linger, MD

## 2020-10-27 ENCOUNTER — Encounter: Payer: Self-pay | Admitting: Internal Medicine

## 2020-10-27 MED ORDER — SOLIQUA 100-33 UNT-MCG/ML ~~LOC~~ SOPN: 20.0000 [IU] | PEN_INJECTOR | Freq: Every day | SUBCUTANEOUS | 1 refills | Status: DC

## 2020-10-27 MED ORDER — FLUCONAZOLE 200 MG PO TABS: 200.0000 mg | ORAL_TABLET | ORAL | 0 refills | Status: DC

## 2020-10-27 MED ORDER — INSULIN PEN NEEDLE 32G X 6 MM MISC: 1.0000 | Freq: Every day | 1 refills | Status: DC

## 2020-10-27 MED ORDER — FREESTYLE LIBRE 2 READER DEVI: 1.0000 | Freq: Every day | 5 refills | Status: DC

## 2020-10-27 MED ORDER — FREESTYLE LIBRE 2 SENSOR MISC: 1.0000 | Freq: Every day | 5 refills | Status: DC

## 2020-10-28 ENCOUNTER — Other Ambulatory Visit: Payer: Self-pay | Admitting: Internal Medicine

## 2020-10-28 DIAGNOSIS — I10 Essential (primary) hypertension: Secondary | ICD-10-CM

## 2020-10-28 DIAGNOSIS — E118 Type 2 diabetes mellitus with unspecified complications: Secondary | ICD-10-CM

## 2020-11-10 ENCOUNTER — Other Ambulatory Visit: Payer: Self-pay | Admitting: Internal Medicine

## 2020-11-10 ENCOUNTER — Other Ambulatory Visit: Payer: Self-pay | Admitting: *Deleted

## 2020-11-10 DIAGNOSIS — I10 Essential (primary) hypertension: Secondary | ICD-10-CM

## 2020-11-10 DIAGNOSIS — K2100 Gastro-esophageal reflux disease with esophagitis, without bleeding: Secondary | ICD-10-CM

## 2020-11-10 DIAGNOSIS — B351 Tinea unguium: Secondary | ICD-10-CM

## 2020-11-10 DIAGNOSIS — E118 Type 2 diabetes mellitus with unspecified complications: Secondary | ICD-10-CM

## 2020-11-10 NOTE — Patient Outreach (Signed)
[facility]) Care Management  11/10/2020  [name] 01/24/1956 [id number]   [facility] Telephone Assessment/Screen for MD referral  Referral Date:10/27/20 Referral Source:  MD referral  Referral Reason: poorly controlled DM2  Insurance:  [place] [facility])    Outreach attempt # 1  Patient is able to verify HIPAA, DOB and address Reviewed and addressed referral to [facility] with patient Consent: [facility] RN CM reviewed [facility] services with patient. Patient gave verbal consent for services [facility] telephonic RN CM.   cbg steady 200s mainly in am ws 128 lowest  Main issue fried foods bread Notes hga1c is better in summer than winter as he gardens MD to be seen on 11/12/20   Social: [name] is a 65 year old   Brother in nursing home sister in law, Burnetta Sabin has medical issues of her own Has a brother & sister passed years ago Has niece across road Rash around waist from insulins had shingles shot in 2017 Stayed with his grandmother as a child Worked in window, Technical sales engineer   DME: one touch several yrs need test strips Pcp called in test at [facility] Oxygen when active r/t H1N1  Plan: Patient agrees to the care plan and follow up with in the next 14-21 business days  Makelle Marrone L. Noelle Penner, RN, BSN, CCM [facility] Telephonic Care Management Care Coordinator Office number [phone] Mobile number [phone]  Main [facility] number [phone] Fax number [phone]

## 2020-11-12 ENCOUNTER — Ambulatory Visit (INDEPENDENT_AMBULATORY_CARE_PROVIDER_SITE_OTHER): Payer: Medicare Other | Admitting: Internal Medicine

## 2020-11-12 ENCOUNTER — Other Ambulatory Visit: Payer: Self-pay

## 2020-11-12 ENCOUNTER — Encounter: Payer: Self-pay | Admitting: Internal Medicine

## 2020-11-12 DIAGNOSIS — B029 Zoster without complications: Secondary | ICD-10-CM | POA: Insufficient documentation

## 2020-11-12 DIAGNOSIS — E118 Type 2 diabetes mellitus with unspecified complications: Secondary | ICD-10-CM

## 2020-11-12 DIAGNOSIS — I10 Essential (primary) hypertension: Secondary | ICD-10-CM | POA: Diagnosis not present

## 2020-11-12 MED ORDER — VALACYCLOVIR HCL 1 G PO TABS
1000.0000 mg | ORAL_TABLET | Freq: Two times a day (BID) | ORAL | 0 refills | Status: DC
Start: 2020-11-12 — End: 2021-04-06

## 2020-11-12 MED ORDER — HYDROCODONE-ACETAMINOPHEN 5-325 MG PO TABS: 1.0000 | ORAL_TABLET | Freq: Four times a day (QID) | ORAL | 0 refills | Status: DC | PRN

## 2020-11-12 NOTE — Assessment & Plan Note (Signed)
Mild to mod, for valtrex, course, pain control, salon paz otc prn, and to f/u any worsening symptoms or concerns

## 2020-11-12 NOTE — Progress Notes (Signed)
Established Patient Office Visit  Subjective:  Patient ID: [name], male    DOB: [birth year]  Age: 65 y.o. MRN: [id number]      Chief Complaint: painful rash x 4 days       HPI:  [name] is a 65 y.o. male here with c/o painful bump rash x 4 days starting from the midling low lumbar with rash ranging around to the mid abdomen in a dermatomal fashion, no fever, trauma, pain is burning and tender sensitive to touch, mild to mod, nothing else makes better or worse. Pt denies chest pain, increased sob or doe, wheezing, orthopnea, PND, increased LE swelling, palpitations, dizziness or syncope.   Pt denies polydipsia, polyuria,  Wt Readings from Last 3 Encounters:  11/12/20 222 lb (100.7 kg)  10/26/20 227 lb (103 kg)  04/22/20 217 lb (98.4 kg)   BP Readings from Last 3 Encounters:  11/12/20 120/84  10/26/20 128/84  04/22/20 130/80         Past Medical History:  Diagnosis Date  . COPD (chronic obstructive pulmonary disease) ([place]) 09/23/08   -PFT 09/23/08 FEV1 2.89(84%), FVC 3.70 (78%), TLC 5.28 (78%), DLCO 64% mixed obstructive and  restrictive pattern.  Spirometry 03/11/09 FEV 3.35 (97%),FVC 4.10 (86%), FEV% 82. Steroid responsive pneumonitis 04/09- required mechanical ventilation during hospitalization - The following hospital test were negative: PCP,ANA,HIV,BAL. - ACE25,RF20,CRP 28,A1AT 263, ESR 109  . Depression   . Diabetes mellitus without complication ([place])   . Dyslipidemia   . Dyslipidemia   . GERD (gastroesophageal reflux disease)   . History of skin cancer   . Hypertension    Echo 10/09 EF 60%  . ILD (interstitial lung disease) ([place])   . Panic attack    Past Surgical History:  Procedure Laterality Date  . BRONCHOSCOPY  10/09  . Ganglion cyst on foot    . HERNIA REPAIR     2 hernia  . Left shoulder surgery      reports that he quit smoking about 17 years ago. His smoking use included cigarettes. He has a 16.00 pack-year smoking history. He has never used  smokeless tobacco. He reports that he does not drink alcohol and does not use drugs. family history includes Diabetes in his brother and father; Emphysema in his brother; Heart attack in his brother; Heart attack (age of onset: 49) in his father. Allergies  Allergen Reactions  . Latex   . Morphine   . Statins     REACTION: weakness, muscle pains   Current Outpatient Medications on File Prior to Visit  Medication Sig Dispense Refill  . aspirin 81 MG EC tablet Take 81 mg by mouth daily.    . Continuous Blood Gluc Receiver (FREESTYLE LIBRE 2 READER) DEVI 1 Act by Does not apply route daily. 2 each 5  . Continuous Blood Gluc Sensor (FREESTYLE LIBRE 2 SENSOR) MISC 1 Act by Does not apply route daily. 2 each 5  . CVS D3 50 MCG (2000 UT) CAPS TAKE 2 CAPSULES (4,000 UNITS TOTAL) BY MOUTH DAILY. 180 capsule 1  . ezetimibe (ZETIA) 10 MG tablet Take 1 tablet (10 mg total) by mouth daily. 90 tablet 3  . famotidine (PEPCID) 40 MG tablet TAKE 1 TABLET BY MOUTH EVERY DAY 90 tablet 1  . fexofenadine (ALLEGRA) 180 MG tablet Take 180 mg by mouth daily as needed for allergies.    . fluconazole (DIFLUCAN) 200 MG tablet TAKE 1 TABLET (200 MG TOTAL) BY MOUTH ONCE A WEEK.  12 tablet 0  . hydrochlorothiazide (MICROZIDE) 12.5 MG capsule TAKE 1 CAPSULE BY MOUTH EVERY DAY 90 capsule 1  . Insulin Glargine-Lixisenatide (SOLIQUA) 100-33 UNT-MCG/ML SOPN Inject 20 Units into the skin daily. 9 mL 1  . Insulin Pen Needle 32G X 6 MM MISC 1 Act by Does not apply route daily. 100 each 1  . KLOR-CON M20 20 MEQ tablet TAKE 1 TABLET BY MOUTH TWICE A DAY 180 tablet 1  . metFORMIN (GLUCOPHAGE) 1000 MG tablet TAKE 1 TABLET (1,000 MG TOTAL) BY MOUTH 2 (TWO) TIMES DAILY WITH A MEAL. 180 tablet 1  . NON FORMULARY Oxygen continuous 1.5 liters with activity    . olmesartan (BENICAR) 40 MG tablet TAKE 1 TABLET BY MOUTH EVERY DAY 90 tablet 1  . ONETOUCH VERIO test strip USE 1 PER DAY 100 each 2  . STIOLTO RESPIMAT 2.5-2.5 MCG/ACT AERS  INHALE 2 PUFFS BY MOUTH INTO THE LUNGS DAILY 4 g 1   No current facility-administered medications on file prior to visit.        ROS:  All others reviewed and negative.  Objective        PE:  BP 120/84   Pulse (!) 101   Temp 98 F (36.7 C) (Oral)   Ht _0  (1.702 m)   Wt 222 lb (100.7 kg)   SpO2 93%   BMI 34.77 kg/m                 Constitutional: Pt appears in NAD               HENT: Head: NCAT.                Right Ear: External ear normal.                 Left Ear: External ear normal.                Eyes: . Pupils are equal, round, and reactive to light. Conjunctivae and EOM are normal               Nose: without d/c or deformity               Neck: Neck supple. Gross normal ROM               Cardiovascular: Normal rate and regular rhythm.                 Pulmonary/Chest: Effort normal and breath sounds without rales or wheezing.                Abd:  Soft, NT, ND, + BS, no organomegaly               Neurological: Pt is alert. At baseline orientation, motor grossly intact               Skin: Skin is warm. No rashes, no other new lesions, LE edema - NONE               Psychiatric: Pt behavior is normal without agitation   Assessment/Plan:  [name] is a 65 y.o. White or Caucasian [1] male with  has a past medical history of COPD (chronic obstructive pulmonary disease) ([place]) (09/23/08), Depression, Diabetes mellitus without complication (HCC), Dyslipidemia, Dyslipidemia, GERD (gastroesophageal reflux disease), History of skin cancer, Hypertension, ILD (interstitial lung disease) ([place]), and Panic attack.   Micro: none  Cardiac tracings I have personally interpreted today:  none  Pertinent Radiological findings (summarize): none    Assessment & Plan:   Problem List Items Addressed This Visit      High   Shingles outbreak    Mild to mod, for valtrex, course, pain control, salon paz otc prn, and to f/u any worsening symptoms or concerns      Relevant Medications    valACYclovir (VALTREX) 1000 MG tablet     Unprioritized   Type II diabetes mellitus with manifestations ([place])    Lab Results  Component Value Date   HGBA1C 8.4 (H) 10/26/2020   Stable, pt to continue current medical treatment - insulin       Essential hypertension, benign    BP Readings from Last 3 Encounters:  11/12/20 120/84  10/26/20 128/84  04/22/20 130/80   Stable, pt to continue medical treatment microzide, benicar          Meds ordered this encounter  Medications  . valACYclovir (VALTREX) 1000 MG tablet    Sig: Take 1 tablet (1,000 mg total) by mouth 2 (two) times daily.    Dispense:  20 tablet    Refill:  0  . HYDROcodone-acetaminophen (NORCO/VICODIN) 5-325 MG tablet    Sig: Take 1 tablet by mouth every 6 (six) hours as needed.    Dispense:  30 tablet    Refill:  0    Follow-up: Return if symptoms worsen or fail to improve.   Cathlean Cower, MD 11/12/2020 10:14 PM [facility]

## 2020-11-12 NOTE — Assessment & Plan Note (Signed)
BP Readings from Last 3 Encounters:  11/12/20 120/84  10/26/20 128/84  04/22/20 130/80   Stable, pt to continue medical treatment microzide, benicar

## 2020-11-12 NOTE — Assessment & Plan Note (Signed)
Lab Results  Component Value Date   HGBA1C 8.4 (H) 10/26/2020   Stable, pt to continue current medical treatment - insulin

## 2020-11-12 NOTE — Patient Instructions (Signed)
Please take all new medication as prescribed - the valtrex (antibiotic), and the hydrocodone as needed for pain  You can also use the OTC Salon Paz patch for pain as well  Please continue all other medications as before, and refills have been done if requested.  Please have the pharmacy call with any other refills you may need.  Please keep your appointments with your specialists as you may have planned

## 2020-11-16 DIAGNOSIS — I10 Essential (primary) hypertension: Secondary | ICD-10-CM | POA: Diagnosis not present

## 2020-11-16 DIAGNOSIS — R0902 Hypoxemia: Secondary | ICD-10-CM | POA: Diagnosis not present

## 2020-11-24 ENCOUNTER — Telehealth: Payer: Self-pay | Admitting: Internal Medicine

## 2020-11-24 ENCOUNTER — Other Ambulatory Visit: Payer: Self-pay | Admitting: *Deleted

## 2020-11-24 NOTE — Patient Outreach (Signed)
[facility]) Care Management  11/24/2020  [name] 1956-01-20 [id number]   Opened in error  Cala Bradford L. Noelle Penner, RN, BSN, CCM [facility] Telephonic Care Management Care Coordinator Office number [phone] Main [facility] number [phone] Fax number [phone]

## 2020-11-24 NOTE — Telephone Encounter (Signed)
The glucometer and strips are not covered under his insurance, they suggested the one touch vero. Patient states he has not been using the insulin because he hasn't been able to check his sugars.

## 2020-11-25 MED ORDER — BLOOD GLUCOSE METER KIT: PACK | 0 refills | Status: DC

## 2020-11-25 NOTE — Telephone Encounter (Signed)
New script for meter sent for pharmacy to fill based on insurance preference.

## 2020-11-26 ENCOUNTER — Other Ambulatory Visit: Payer: Self-pay

## 2020-11-26 ENCOUNTER — Other Ambulatory Visit: Payer: Self-pay | Admitting: *Deleted

## 2020-11-26 ENCOUNTER — Telehealth: Payer: Self-pay | Admitting: Internal Medicine

## 2020-11-26 NOTE — Telephone Encounter (Signed)
Kim w/ [facility] called and wanted to let Dr. Yetta Barre know what glucose meters were covered under the patients insurance. The patient is requesting that the One Touch Verio and the One Touch Verio test strips [id number] [place] [place] - [address]

## 2020-11-26 NOTE — Patient Outreach (Addendum)
[facility]) Care Management  11/26/2020  [name] Sep 10, 1956 [id number]   [facility] Telephone Assessment/Screen for MD referral  Referral Date:10/27/20 Referral Source:  MD referral  Referral Reason: poorly controlled DM 2  Insurance:  [place] [facility])    Outreach attempt # 2  Patient is able to verify HIPAA, DOB and address Reviewed and addressed referral to [facility] patient Consent: [facility] RN CM reviewed [facility] services with patient. Patient gave verbal consent for services [facility] telephonic RN CM.  Noted with word finding issues during this outreach     Social: Mr [name] is a 65 year old disabled (on disability) retired with a 10 th grade education who returned to get his GED States he can read simplex materials Worked in window, Technical sales engineer Brother in nursing home with sister in Social worker, Burnetta Sabin has medical issues of her own and is not of much assist Has another brother & sister who passed years ago Has a niece across road from his home Stayed with his grandmother as a child for a short time and in a children's home in [place] Not computer savvy Has a Flip phone Has a Computer but prefers not to access after a virus attack  shingles  now being treated by Dr Yetta Barre  Completed quarantine Feels better but continues to have one area on stomach that is irritating him Keeps site clean and dry Continues use of Valtrex, pain medicine, [place], pain patches Pt held insulin when initially got shingles as he did not know the cause of the outbreak  PMH shingles, chicken pox as a child   DME glasses, glucometer oxygen  Diabetes:  still pending a purchase of a glucometer at the time of this outreach He has outreached to pcp for assistance He reports being cautious about taking his insulin until he verifies his CBG values Completed a conference call with patient to [facility] customer service [phone] Deberah Pelton who states Mr Dickerman  has a covered benefit for an accu chek guide, accu chek Aviva plus and an one touch verio flex,  One touch verio,  Or one touch Ultra 2  Provided Mr Spizzirri's number for his over the counter (OTC) benefits First line [phone] $50 can be used Solicitor benefit information provided for 24 one ways trip annually [phone] via [facility]  Emergency response button benefit at 0 cost, with the vendor phillips life line [phone]  Completed a conference call with patient to  Pcp office [phone] Spoke with Levert Feinstein to provided the name of the [facility] covered one touch verio glucometer & test strips to be called in to [facility]   [facility] a [phone] Spoke with Reynolds Bowl confirms an one touch ultra 2 is ready for pick up  She will inform pcp office and Mr Beg will pick up this DME today Pt voiced appreciation of services rendered Pt aware of last HgA1c was 6.7 now 8.4  CBG steady 129-200's Main issue fried foods bread Notes HgA1c is better in summer than winter as he gardens    Plan Patient agrees to care plan and follow up within 21-30 business days Pt encouraged to return a call to [facility] RN CM prn Goals Addressed              This Visit's Progress     Patient Stated   .  COMPLETED: [facility]) Find Help in My Community (pt-stated)   On track     Timeframe:  Short-Term Goal Priority:  Medium Start Date:                     11/27/20        Expected End Date:       11/27/20                Follow Up Date    - begin a notebook of services in my neighborhood or community - call 211 when I need some help - follow-up on any referrals for help I am given - have a back-up plan - make a list of family or friends that I can call       Notes: 11/27/20 reached out to PCP about Glucose meter need  Asked assistance with a conference call to [facility] customer service [phone] Deberah Pelton who to review covered benefit  for an accu chek guide, accu chek Aviva plus and an one touch verio flex,  One touch verio,  Or one touch Ultra 2  Received a number for his over the counter (OTC) benefits First line [phone] $50 can be used quarterly Transportation benefit information provided for 24 one ways trip annually [phone] via [employer]  Emergency response button benefit at 0 cost, with the vendor phillips life line [phone]    .  [facility]) Monitor and Manage My Blood Sugar-Diabetes Type 2 (pt-stated)   On track     Timeframe:  Short-Term Goal Priority:  Medium Start Date:                            11/10/20 Expected End Date:   02/12/21                    Follow Up Date 12/21/20   - check blood sugar at prescribed times       Notes:         Cala Bradford L. Noelle Penner, RN, BSN, CCM [facility] Telephonic Care Management Care Coordinator Office number [phone] Main [facility] number [phone] Fax number [phone]

## 2020-11-27 ENCOUNTER — Encounter: Payer: Self-pay | Admitting: *Deleted

## 2020-11-28 ENCOUNTER — Other Ambulatory Visit: Payer: Self-pay | Admitting: Internal Medicine

## 2020-11-28 DIAGNOSIS — E118 Type 2 diabetes mellitus with unspecified complications: Secondary | ICD-10-CM

## 2020-11-28 MED ORDER — ONETOUCH VERIO W/DEVICE KIT: 1.0000 | PACK | Freq: Two times a day (BID) | 2 refills | Status: AC

## 2020-11-28 MED ORDER — ONETOUCH VERIO VI STRP: 1.0000 | ORAL_STRIP | Freq: Two times a day (BID) | 3 refills | Status: DC

## 2020-11-30 ENCOUNTER — Telehealth: Payer: Self-pay | Admitting: Pharmacist

## 2020-11-30 NOTE — Progress Notes (Signed)
Chronic Care Management Pharmacy Assistant   Name: JERAMINE DELIS  MRN: [id number] DOB: 1956/02/16  Reason for Encounter: Diabetic Adherence Call   PCP : [name], MD  Allergies:   Allergies  Allergen Reactions   Latex    Morphine    Statins     REACTION: weakness, muscle pains    Medications: Outpatient Encounter Medications as of 11/30/2020  Medication Sig Note   aspirin 81 MG EC tablet Take 81 mg by mouth daily.    blood glucose meter kit and supplies Dispense based on patient and insurance preference. Use up to four times daily as directed. (FOR ICD-10 E10.9, E11.9).    Blood Glucose Monitoring Suppl (ONETOUCH VERIO) w/Device KIT 1 Act by Does not apply route in the morning and at bedtime.    CVS D3 50 MCG (2000 UT) CAPS TAKE 2 CAPSULES (4,000 UNITS TOTAL) BY MOUTH DAILY.    ezetimibe (ZETIA) 10 MG tablet Take 1 tablet (10 mg total) by mouth daily.    famotidine (PEPCID) 40 MG tablet TAKE 1 TABLET BY MOUTH EVERY DAY    fexofenadine (ALLEGRA) 180 MG tablet Take 180 mg by mouth daily as needed for allergies.    fluconazole (DIFLUCAN) 200 MG tablet TAKE 1 TABLET (200 MG TOTAL) BY MOUTH ONCE A WEEK.    glucose blood (ONETOUCH VERIO) test strip 1 each by Other route in the morning and at bedtime. Use as instructed    hydrochlorothiazide (MICROZIDE) 12.5 MG capsule TAKE 1 CAPSULE BY MOUTH EVERY DAY    HYDROcodone-acetaminophen (NORCO/VICODIN) 5-325 MG tablet Take 1 tablet by mouth every 6 (six) hours as needed.    Insulin Glargine-Lixisenatide (SOLIQUA) 100-33 UNT-MCG/ML SOPN Inject 20 Units into the skin daily. 11/26/2020: Pt held insulin when initially got shingles    Insulin Pen Needle 32G X 6 MM MISC 1 Act by Does not apply route daily.    KLOR-CON M20 20 MEQ tablet TAKE 1 TABLET BY MOUTH TWICE A DAY    Lancets (ONETOUCH ULTRASOFT) lancets     metFORMIN (GLUCOPHAGE) 1000 MG tablet TAKE 1 TABLET (1,000 MG TOTAL) BY MOUTH 2 (TWO) TIMES DAILY WITH A  MEAL.    NON FORMULARY Oxygen continuous 1.5 liters with activity    olmesartan (BENICAR) 40 MG tablet TAKE 1 TABLET BY MOUTH EVERY DAY    ONETOUCH VERIO test strip USE 1 PER DAY    STIOLTO RESPIMAT 2.5-2.5 MCG/ACT AERS INHALE 2 PUFFS BY MOUTH INTO THE LUNGS DAILY    valACYclovir (VALTREX) 1000 MG tablet Take 1 tablet (1,000 mg total) by mouth 2 (two) times daily.    No facility-administered encounter medications on file as of 11/30/2020.    Current Diagnosis: Patient Active Problem List   Diagnosis Date Noted   Shingles outbreak 11/12/2020   Onychomycosis of right great toe 10/26/2020   Encounter for general adult medical examination with abnormal findings 10/26/2020   Heme positive stool 04/15/2020   Statin intolerance 03/23/2020   Vitamin D deficiency disease 06/12/2018   GERD with esophagitis 12/12/2017   Thoracic aortic aneurysm without rupture ([place]) 12/12/2017   Screen for colon cancer 01/12/2016   Routine general medical examination at a health care facility 07/16/2015   Benign prostatic hyperplasia 07/13/2014   Type II diabetes mellitus with manifestations ([place]) 11/12/2013   Pure hyperglyceridemia 07/09/2013   Upper airway cough syndrome 02/06/2012   Hyperlipidemia with target LDL less than 130 07/05/2011   EMPHYSEMA 12/07/2008   Essential hypertension, benign 08/26/2008  INTERSTITIAL LUNG DISEASE 08/26/2008   Respiratory failure with hypoxia ([place]) 08/26/2008    Goals Addressed   None     Follow-Up:  Pharmacist Review    Recent Relevant Labs: Lab Results  Component Value Date/Time   HGBA1C 8.4 (H) 10/26/2020 11:58 AM   HGBA1C 6.3 (A) 04/22/2020 02:08 PM   HGBA1C 7.5 (H) 12/22/2019 11:20 AM   MICROALBUR <0.7 10/26/2020 11:58 AM   MICROALBUR <0.7 06/24/2019 10:24 AM    Kidney Function Lab Results  Component Value Date/Time   CREATININE 0.99 10/26/2020 11:58 AM   CREATININE 1.06 04/08/2020 04:06 PM   GFR 79.19 10/26/2020 11:58 AM    GFRNONAA >60 08/19/2008 06:55 AM   GFRAA  08/19/2008 06:55 AM    >60        The eGFR has been calculated using the MDRD equation. This calculation has not been validated in all clinical     Current antihyperglycemic regimen: The patient states he is taking [place], metformin   What recent interventions/DTPs have been made to improve glycemic control: The patient states that he try to watch what he eats especially sugars   Have there been any recent hospitalizations or ED visits since last visit with CPP? The patient states that he has not been to the hospital   Patient denies hypoglycemic symptoms  Patient denies hyperglycemic symptoms  How often are you checking your blood sugar? The patient states that he checks his sugar twice a day   What are your blood sugars ranging? The patient states that his blood sugar ranges from 140-177 o Fasting: patient checks as soon as he wakes up o Before meals: NA o After meals: NA o Bedtime: Patient checks before bed   During the week, how often does your blood glucose drop below 70? The patient states that he has not had any reading below 70   Are you checking your feet daily/regularly? The patient states that he does not have any issues with his feet  Adherence Review: Is the patient currently on a STATIN medication? No Is the patient currently on ACE/ARB medication? Yes, Olmesartan Does the patient have >5 day gap between last estimated fill dates? No   Wendy Poet, Clinical Pharmacist Assistant [facility] [phone]  Total time:30

## 2020-12-02 NOTE — Telephone Encounter (Signed)
Patient started [place] in January and BG is ranging 140-177. He denies hypoglycemia. He is statin-intolerant but doing well on ezetimibe. LDL improved from 177 > 126 after starting ezetimibe.

## 2020-12-06 ENCOUNTER — Other Ambulatory Visit: Payer: Self-pay | Admitting: Internal Medicine

## 2020-12-14 DIAGNOSIS — R0902 Hypoxemia: Secondary | ICD-10-CM | POA: Diagnosis not present

## 2020-12-14 DIAGNOSIS — I10 Essential (primary) hypertension: Secondary | ICD-10-CM | POA: Diagnosis not present

## 2020-12-15 ENCOUNTER — Ambulatory Visit: Payer: Medicare Other | Admitting: *Deleted

## 2020-12-21 ENCOUNTER — Other Ambulatory Visit: Payer: Self-pay

## 2020-12-21 ENCOUNTER — Other Ambulatory Visit: Payer: Self-pay | Admitting: *Deleted
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
## 2020-12-27 ENCOUNTER — Telehealth: Payer: Medicare Other

## 2020-12-30 DIAGNOSIS — Z888 Allergy status to other drugs, medicaments and biological substances status: Secondary | ICD-10-CM

## 2020-12-30 NOTE — Progress Notes (Addendum)
[facility] Customer service manager [facility]) Quality  12/30/2020  [name] 05-16-1956 [id number]   Per chart review, patient's allergy to statin causes weakness and muscle pain. I sent an inbasket message earlier this year to Dr. Yetta Barre with regard to adding G72.0 dx code for statin intolerance of myopathy to 11/12/2020 office visit. However, Z78.9 (statin intolerance) was associated with the encounter - this dx code will not remove the patient from the 2022 CMS measure. Please consider using dx. code G 72.0 to remove from the SUPD 2022 CMS measure to the office visit on 04/25/2021.   Thank you for allowing pharmacy to be a part of this patient's care.  Cephus Shelling, PharmD Clinical Pharmacist [facility] Cell: [phone]

## 2021-01-14 DIAGNOSIS — I10 Essential (primary) hypertension: Secondary | ICD-10-CM | POA: Diagnosis not present

## 2021-01-14 DIAGNOSIS — R0902 Hypoxemia: Secondary | ICD-10-CM | POA: Diagnosis not present

## 2021-01-21 ENCOUNTER — Other Ambulatory Visit: Payer: Self-pay | Admitting: *Deleted

## 2021-01-21 NOTE — Patient Outreach (Addendum)
[facility]) Care Management  01/21/2021  OSHER OETTINGER April 25, 1956 [id number]   [facility] unsuccessful Telephone outreach to complex care patient Mr Donahoe was referred to [facility] on 10/27/20 Referral Source:MD referral Referral Reason:poorly controlledDM 2 Insurance:[employer] care [facility])   Outreach to mobile number [phone] [facility] Unsuccessful outreach   Outreach attempt to the home number  No answer. [facility] RN CM left HIPAA [facility] Portability and Accountability Act) compliant voicemail message along with CM's contact info.   Plan: [facility] Main RN CM scheduled this patient for another call attempt within 4-7 business days  Leslea Vowles L. Noelle Penner, RN, BSN, CCM [facility] Telephonic Care Management Care Coordinator Office number [phone] Mobile number [phone]  Main [facility] number [phone] Fax number [phone]

## 2021-01-28 ENCOUNTER — Encounter: Payer: Self-pay | Admitting: *Deleted

## 2021-01-28 ENCOUNTER — Other Ambulatory Visit: Payer: Self-pay | Admitting: *Deleted

## 2021-01-28 ENCOUNTER — Other Ambulatory Visit: Payer: Self-pay

## 2021-01-28 NOTE — Patient Outreach (Signed)
[facility]) Care Management  01/28/2021  [name] [birth year] [id number]  [facility] successful Telephoneoutreach to complex care patient [name] was referred to [facility] on1/12/22 Referral Source:MD referral Referral Reason:poorly controlledDM 2 Insurance:[facility])          Follow up assessment Social-Sister died a 2 weeks ago  He reports having 8 siblings with a loss of 4   Has 2 brothers and 2 sisters living   Coping doing well sad of sister death but no depression symptoms voiced Denies need of grief resources   Chronic obstructive pulmonary disease (COPD)  Breath heavy at times Wear masks outside  Not having to use his emergency inhaler recently Now on Allegra daily Discussed mucinex as part of his action plan He has some in the home for prn use  Recommend annual imaging follow up by CTA or MRA for his stable Thoracic aortic aneurysm without rupture   Encouraged to outreach to Dr Yetta Barre to get this schedule even with noticing that his next primary care provider (PCP) visit in July 2022  Shingles- pt wondering if it is clear enough to get the third covid booster  Original breakout in January 2022 last symptoms noted in February 2022 He and [facility] RN CM discussed general 6 weeks between vaccines  Diabetes (DM) type 2  He reports his CBG varies He continues to administer insulin as ordered He is cautious with his insulin doses as he wants to prevent hypoglycemia He reported the lowest CBG since last outreach was 45 (lowest he can recall was 69) He felt only weak at 98 The highest has been  since last outreach 191 He confirms this was related to dietary intake of potatoes Discussed action plan for sick days with monitoring intake of carbohydrates more, increase fluids and taking medi He goes out to [employer] his yard with mask on the riding mower and weed eating  He wants loss about 30+ lbs Weight loss discussed  General drinks 2 cups  of coffee  [facility] RN CM intervention Attempt to reach Dr Yetta Barre office unsuccessful office closed for the holiday Pt to attempt call next week  Plan: [facility] RN CM scheduled this patient for another call attempt within 30 business days  Eric Mathe L. Noelle Penner, RN, BSN, CCM [facility] Telephonic Care Management Care Coordinator Office number [phone] Mobile number [phone]  Main [facility] number [phone] Fax number [phone]

## 2021-02-13 DIAGNOSIS — R0902 Hypoxemia: Secondary | ICD-10-CM | POA: Diagnosis not present

## 2021-02-13 DIAGNOSIS — I10 Essential (primary) hypertension: Secondary | ICD-10-CM | POA: Diagnosis not present

## 2021-02-17 ENCOUNTER — Other Ambulatory Visit: Payer: Self-pay | Admitting: *Deleted

## 2021-02-17 NOTE — Patient Outreach (Signed)
[facility]) Care Management  02/17/2021  [name] [birth year] [id number]   [facility] Unsuccessful outreach  [name] was referred to [facility] on1/12/22 Referral Source:MD referral Referral Reason:poorly controlledDM 2 Insurance:[employer] care [facility])  Last successful outreach 01/28/21  Outreach attempt to the home number  No answer. [facility] RN CM left HIPAA [facility] Portability and Accountability Act) compliant voicemail message along with CM's contact info.   Plan: [facility] RN CM scheduled this patient for another call attempt within 4-7 business days  [name], RN, BSN, CCM [facility] Telephonic Care Management Care Coordinator Office number [phone] Mobile number [phone]  Main [facility] number [phone] Fax number [phone]

## 2021-02-17 NOTE — Patient Outreach (Signed)
[facility]) Care Management  02/17/2021  [name] Jul 17, 1956 [id number]   [facility] outreach from patient  [name] was referred to [facility] on1/12/22 Referral Source:MD referral Referral Reason:poorly controlledDM 2 Insurance:[employer] care [facility])  Last successful outreach 01/28/21  [name] returned a call to [facility] RN CM  Patient is able to verify HIPAA ([place] and Accountability Act) identifiers Reviewed and addressed the purpose of the follow up call with the patient  Consent: [facility] RN CM reviewed [facility] services with patient. Patient gave verbal consent for services.   Follow up  He is doing well  Has been completing errands today  Diabetes  Cbg was 150-111 today When he has hypoglycemia symptoms of fatigue when cbg value goes to 90 or below  Lowest recall was 64 and "felt faint" Last HgA1c on 10/26/20 8.4 Discussed cbg averages 7 days 149  14 days 152  90 days 167  Barriers identified fear of hypoglycemia so not using insulin injections as ordered and costs of insulin and endocrinology services on a fixed income Barrier He voices being uncomfortable with taking insulin when cbg values or in the low 100s and not taking insulin at night as he wants to avoid hypoglycemic symptoms in the evenings especially as he lives alone Barrier He voices concern with paying for a high cost Insulin that he reports now can not be taken as cbg values remains low per pt He has been seen by an endocrinologist before but on today feels he is managing it well and prefers not to being to accrue charges he can not afford related to another provider co pay  He reports he does better with his HgA1c in the spring/summer time vs the fall/ winter as he prefers to eat more vegetables (squash, okra, etc) and is more active with his garden and lawn care Discussed a change of activity and increase vegetables in the spring/summer  improves his HgA1c as noted per review of HgA1c trends with patient He has a goal in EPIC for MD office to become more active by walking in local places like [employer]  He reports he is make attempts to improve this goal   Today completed colon screening home kit and returned it on today He reports he has not had a colonoscopy in years and he "is leary of them"  History of colon cancer for his paternal grandmother and one of his brothers  Pt not wanting to outreach today to Dr Ronnald Ramp to schedule CT/Eric He is resting from errands  He agrees to outreach to [facility] RN CM when it is more convenient Reviewed his appointments listed in EPIC  Follow up with pcp 04/25/21   He and [facility] RN CM discussed future transfer to [facility] disease management/health coach Discussed the education provided by [facility] disease manager related to new home management protocols  Pt voices reluctance with this transfer and preference to continue service with [facility] RN CM  He and [facility] RN CM agreed to re address this after his July 2022 lab results indicating if any improvements with HgA1c  Plan: Patient agrees to care plan and follow up [facility] RN CM scheduled this patient for another call attempt within 90 business days Pt encouraged to return a call to [facility] RN CM prn'  Goals Addressed              This Visit's Progress     Patient Stated   .  [facility]) Make and  Keep All Appointments (pt-stated)   On track     Timeframe:  Short-Term Goal Priority:  Medium Start Date:          02/17/21                   Expected End Date:               05/09/21        Follow Up Date 05/09/21   - keep a calendar with appointment dates    Notes:  02/17/21 denied offer to conference with him to schedule pending annual CT appointment Encouraged to complete task on his own Reviewed upcoming appointments with pcp in July 2022    .  [facility]) Monitor and Manage My Blood Sugar-Diabetes Type 2 (pt-stated)   On track     Timeframe:  Long-Range Goal Priority:   Medium Start Date:                            11/10/20 Expected End Date:   02/12/21                    Follow Up Date  05/09/21  - check blood sugar at prescribed times - take the blood sugar meter to all doctor visits       Notes:   02/17/21 continues to monitor able to review 7, 14 and 30 day values used to calculate possible Hga1c of 7.5 pending next office lab in July 2022 Last HgA1c of 8.4 on 10/26/20 Discussed a change of activity and increase vegetables in the spring/summer improves his HgA1c as noted per review of HgA1c trends with patient 01/28/21 still continue to monitor at home Noting some variations 98-191 12/21/20 confirms has new glucose monitor, monitors 2-3 times a day, keeping a log and will take meter to MD office        Joelene Millin L. Lavina Hamman, RN, BSN, [place] Coordinator Office number [phone] Mobile number [phone]  Main [facility] number [phone] Fax number [phone]

## 2021-02-24 ENCOUNTER — Ambulatory Visit: Payer: Self-pay | Admitting: *Deleted

## 2021-03-02 ENCOUNTER — Telehealth: Payer: Self-pay | Admitting: Pharmacist

## 2021-03-02 NOTE — Progress Notes (Signed)
Chronic Care Management Pharmacy Assistant   Name: [name]  MRN: [id number] DOB: 1956-02-01   Reason for Encounter: Disease State Diabetic Call   Conditions to be addressed/monitored: DMII   Recent office visits:  None ID  Recent consult visits:  None ID  Hospital visits:  None in previous 6 months  Medications: Outpatient Encounter Medications as of 03/02/2021  Medication Sig Note  . aspirin 81 MG EC tablet Take 81 mg by mouth daily.   . blood glucose meter kit and supplies Dispense based on patient and insurance preference. Use up to four times daily as directed. (FOR ICD-10 E10.9, E11.9).   Marland Kitchen Blood Glucose Monitoring Suppl (ONETOUCH VERIO) w/Device KIT 1 Act by Does not apply route in the morning and at bedtime.   . [employer]S D3 50 MCG (2000 UT) CAPS TAKE 2 CAPSULES (4,000 UNITS TOTAL) BY MOUTH DAILY.   Marland Kitchen ezetimibe (ZETIA) 10 MG tablet Take 1 tablet (10 mg total) by mouth daily.   . famotidine (PEPCID) 40 MG tablet TAKE 1 TABLET BY MOUTH EVERY DAY   . fexofenadine (ALLEGRA) 180 MG tablet Take 180 mg by mouth daily as needed for allergies.   Marland Kitchen glucose blood (ONETOUCH VERIO) test strip 1 each by Other route in the morning and at bedtime. Use as instructed   . hydrochlorothiazide (MICROZIDE) 12.5 MG capsule TAKE 1 CAPSULE BY MOUTH EVERY DAY   . HYDROcodone-acetaminophen (NORCO/VICODIN) 5-325 MG tablet Take 1 tablet by mouth every 6 (six) hours as needed.   . Insulin Glargine-Lixisenatide (SOLIQUA) 100-33 UNT-MCG/ML SOPN Inject 20 Units into the skin daily. 11/26/2020: Pt held insulin when initially got shingles   . Insulin Pen Needle 32G X 6 MM MISC 1 Act by Does not apply route daily.   Marland Kitchen KLOR-CON M20 20 MEQ tablet TAKE 1 TABLET BY MOUTH TWICE A DAY   . Lancets (ONETOUCH ULTRASOFT) lancets    . metFORMIN (GLUCOPHAGE) 1000 MG tablet TAKE 1 TABLET (1,000 MG TOTAL) BY MOUTH 2 (TWO) TIMES DAILY WITH A MEAL.   . NON FORMULARY Oxygen continuous 1.5 liters with activity   .  olmesartan (BENICAR) 40 MG tablet TAKE 1 TABLET BY MOUTH EVERY DAY   . ONETOUCH VERIO test strip USE 1 PER DAY   . STIOLTO RESPIMAT 2.5-2.5 MCG/ACT AERS INHALE 2 PUFFS BY MOUTH INTO THE LUNGS DAILY   . valACYclovir (VALTREX) 1000 MG tablet Take 1 tablet (1,000 mg total) by mouth 2 (two) times daily.    No facility-administered encounter medications on file as of 03/02/2021.    Recent Relevant Labs: Lab Results  Component Value Date/Time   HGBA1C 8.4 (H) 10/26/2020 11:58 AM   HGBA1C 6.3 (A) 04/22/2020 02:08 PM   HGBA1C 7.5 (H) 12/22/2019 11:20 AM   MICROALBUR <0.7 10/26/2020 11:58 AM   MICROALBUR <0.7 06/24/2019 10:24 AM    Kidney Function Lab Results  Component Value Date/Time   CREATININE 0.99 10/26/2020 11:58 AM   CREATININE 1.06 04/08/2020 04:06 PM   GFR 79.19 10/26/2020 11:58 AM   GFRNONAA >60 08/19/2008 06:55 AM   GFRAA  08/19/2008 06:55 AM    >60        The eGFR has been calculated using the MDRD equation. This calculation has not been validated in all clinical    . Current antihyperglycemic regimen:  Soliqua 100-33 units, and metformin 1000 mg I tab twice daily . What recent interventions/DTPs have been made to improve glycemic control:  None ID . Have there been  any recent hospitalizations or ED visits since last visit with CPP? No   . Patient denies hypoglycemic symptoms, including None   . Patient denies hyperglycemic symptoms, including none   . How often are you checking your blood sugar? twice daily . What are your blood sugars ranging? 96-163 o Fasting:  o Before meals: 180 o After meals:  o Bedtime:  . During the week, how often does your blood glucose drop below 70? Patient stated that his blood sugar gets to low if he take soliqua everyday  . Are you checking your feet daily/regularly? Patient states that he is taking an antibiotic for his toenail   Adherence Review: Is the patient currently on a STATIN medication? No Is the patient currently on  ACE/ARB medication? Yes Does the patient have >5 day gap between last estimated fill dates? No   Star Rating Drugs: olmesartan 02/19/21 90 ds  [place] Pharmacist Assistant [phone]  Time spent:40

## 2021-03-03 ENCOUNTER — Other Ambulatory Visit: Payer: Self-pay | Admitting: Internal Medicine

## 2021-03-16 DIAGNOSIS — R0902 Hypoxemia: Secondary | ICD-10-CM | POA: Diagnosis not present

## 2021-03-16 DIAGNOSIS — I10 Essential (primary) hypertension: Secondary | ICD-10-CM | POA: Diagnosis not present

## 2021-03-28 ENCOUNTER — Other Ambulatory Visit: Payer: Self-pay | Admitting: Internal Medicine

## 2021-03-28 DIAGNOSIS — I712 Thoracic aortic aneurysm, without rupture, unspecified: Secondary | ICD-10-CM

## 2021-03-31 ENCOUNTER — Telehealth: Payer: Self-pay | Admitting: Pharmacist

## 2021-03-31 NOTE — Progress Notes (Signed)
Chronic Care Management Pharmacy Assistant   Name: [name]  MRN: [id number] DOB: 01-19-1956   Reason for Encounter: Disease State   Conditions to be addressed/monitored: DMII  Primary concerns for visit include: Blood Glucose   Recent office visits:  03/28/21 Dr. Ronnald Ramp ordered a ct angio chest w/ or wo cm  Recent consult visits:  None ID  Hospital visits:  None in previous 6 months  Medications: Outpatient Encounter Medications as of 03/31/2021  Medication Sig Note   aspirin 81 MG EC tablet Take 81 mg by mouth daily.    blood glucose meter kit and supplies Dispense based on patient and insurance preference. Use up to four times daily as directed. (FOR ICD-10 E10.9, E11.9).    Blood Glucose Monitoring Suppl (ONETOUCH VERIO) w/Device KIT 1 Act by Does not apply route in the morning and at bedtime.    CVS D3 50 MCG (2000 UT) CAPS TAKE 2 CAPSULES (4,000 UNITS TOTAL) BY MOUTH DAILY.    ezetimibe (ZETIA) 10 MG tablet Take 1 tablet (10 mg total) by mouth daily.    famotidine (PEPCID) 40 MG tablet TAKE 1 TABLET BY MOUTH EVERY DAY    fexofenadine (ALLEGRA) 180 MG tablet Take 180 mg by mouth daily as needed for allergies.    glucose blood (ONETOUCH VERIO) test strip 1 each by Other route in the morning and at bedtime. Use as instructed    hydrochlorothiazide (MICROZIDE) 12.5 MG capsule TAKE 1 CAPSULE BY MOUTH EVERY DAY    HYDROcodone-acetaminophen (NORCO/VICODIN) 5-325 MG tablet Take 1 tablet by mouth every 6 (six) hours as needed.    Insulin Glargine-Lixisenatide (SOLIQUA) 100-33 UNT-MCG/ML SOPN Inject 20 Units into the skin daily. 11/26/2020: Pt held insulin when initially got shingles    Insulin Pen Needle 32G X 6 MM MISC 1 Act by Does not apply route daily.    KLOR-CON M20 20 MEQ tablet TAKE 1 TABLET BY MOUTH TWICE A DAY    Lancets (ONETOUCH ULTRASOFT) lancets     metFORMIN (GLUCOPHAGE) 1000 MG tablet TAKE 1 TABLET (1,000 MG TOTAL) BY MOUTH 2 (TWO) TIMES DAILY WITH A MEAL.     NON FORMULARY Oxygen continuous 1.5 liters with activity    olmesartan (BENICAR) 40 MG tablet TAKE 1 TABLET BY MOUTH EVERY DAY    ONETOUCH ULTRA test strip USE TO TEST UP TO 4 TIMES DAILY AS DIRECTED    ONETOUCH VERIO test strip USE 1 PER DAY    STIOLTO RESPIMAT 2.5-2.5 MCG/ACT AERS INHALE 2 PUFFS BY MOUTH INTO THE LUNGS DAILY    valACYclovir (VALTREX) 1000 MG tablet Take 1 tablet (1,000 mg total) by mouth 2 (two) times daily.    No facility-administered encounter medications on file as of 03/31/2021.   Pharmacist Review Recent Relevant Labs: Lab Results  Component Value Date/Time   HGBA1C 8.4 (H) 10/26/2020 11:58 AM   HGBA1C 6.3 (A) 04/22/2020 02:08 PM   HGBA1C 7.5 (H) 12/22/2019 11:20 AM   MICROALBUR <0.7 10/26/2020 11:58 AM   MICROALBUR <0.7 06/24/2019 10:24 AM    Kidney Function Lab Results  Component Value Date/Time   CREATININE 0.99 10/26/2020 11:58 AM   CREATININE 1.06 04/08/2020 04:06 PM   GFR 79.19 10/26/2020 11:58 AM   GFRNONAA >60 08/19/2008 06:55 AM   GFRAA  08/19/2008 06:55 AM    >60        The eGFR has been calculated using the MDRD equation. This calculation has not been validated in all clinical  Current antihyperglycemic regimen:  Metformin 1000 mg 1 tab bid Solliqua 100-33 units inject 20 units into the skin What recent interventions/DTPs have been made to improve glycemic control:  None Have there been any recent hospitalizations or ED visits since last visit with CPP? No Patient denies hypoglycemic symptoms, including None Patient denies hyperglycemic symptoms, including none How often are you checking your blood sugar? 3-4 times daily What are your blood sugars ranging?  Fasting: None Before meals: 160 After meals: 108 Bedtime: None During the week, how often does your blood glucose drop below 70? Never Are you checking your feet daily/regularly? Patient is not having any issues with feet  Adherence Review: Is the patient currently on a  STATIN medication? No Is the patient currently on ACE/ARB medication? Yes Does the patient have >5 day gap between last estimated fill dates? No    Star Rating Drugs: Olmesartan 02/19/21 90 ds  Ethelene Hal Clinical Pharmacist Assistant [phone]   Time spent:54

## 2021-04-06 ENCOUNTER — Ambulatory Visit (INDEPENDENT_AMBULATORY_CARE_PROVIDER_SITE_OTHER): Payer: Medicare Other | Admitting: Pharmacist

## 2021-04-06 ENCOUNTER — Other Ambulatory Visit: Payer: Self-pay

## 2021-04-06 DIAGNOSIS — G720 Drug-induced myopathy: Secondary | ICD-10-CM

## 2021-04-06 DIAGNOSIS — E118 Type 2 diabetes mellitus with unspecified complications: Secondary | ICD-10-CM | POA: Diagnosis not present

## 2021-04-06 DIAGNOSIS — I10 Essential (primary) hypertension: Secondary | ICD-10-CM | POA: Diagnosis not present

## 2021-04-06 DIAGNOSIS — J8410 Pulmonary fibrosis, unspecified: Secondary | ICD-10-CM

## 2021-04-06 DIAGNOSIS — E785 Hyperlipidemia, unspecified: Secondary | ICD-10-CM

## 2021-04-06 NOTE — Patient Instructions (Signed)
Visit Information  Phone number for Pharmacist: [phone]   Goals Addressed             This Visit's Progress    Manage My Medicine       Timeframe:  Long-Range Goal Priority:  High Start Date:      04/06/21                       Expected End Date:   10/06/21                    Follow Up Date Sept 2022   - call for medicine refill 2 or 3 days before it runs out - call if I am sick and can't take my medicine - keep a list of all the medicines I take; vitamins and herbals too  -Reduce Soliqua to 10 units and take daily -Keep PCP appt 7/11 @ 11am   Why is this important?   These steps will help you keep on track with your medicines.   Notes:          The patient verbalized understanding of instructions, educational materials, and care plan provided today and declined offer to receive copy of patient instructions, educational materials, and care plan.  Telephone follow up appointment with pharmacy team member scheduled for: 3 months  Al Corpus, PharmD, [place], CPP Clinical Pharmacist [facility] [phone]

## 2021-04-06 NOTE — Progress Notes (Signed)
Chronic Care Management Pharmacy Note  04/06/2021 Name:  [name] MRN:  [id number] DOB:  [birth year]  Summary: -Pt is not using Soliqua daily, only ~once a week when fasting BG > 160  Recommendations/Changes made from today's visit: -Reduce Soliqua to 10 units and take daily. Goal fasting BG < 130 -Keep PCP appt 7/11 @ 11am   Subjective: [name] is an 65 y.o. year old male who is a primary patient of Etta Grandchild, MD.  The CCM team was consulted for assistance with disease management and care coordination needs.    Engaged with patient by telephone for follow up visit in response to provider referral for pharmacy case management and/or care coordination services.   Consent to Services:  The patient was given the following information about Chronic Care Management services today, agreed to services, and gave verbal consent: 1. CCM service includes personalized support from designated clinical staff supervised by the primary care provider, including individualized plan of care and coordination with other care providers 2. 24/7 contact phone numbers for assistance for urgent and routine care needs. 3. Service will only be billed when office clinical staff spend 20 minutes or more in a month to coordinate care. 4. Only one practitioner may furnish and bill the service in a calendar month. 5.The patient may stop CCM services at any time (effective at the end of the month) by phone call to the office staff. 6. The patient will be responsible for cost sharing (co-pay) of up to 20% of the service fee (after annual deductible is met). Patient agreed to services and consent obtained.  Patient Care Team: Etta Grandchild, MD as PCP - General Coralyn Helling, MD as Consulting Physician (Pulmonary Disease) End, Cristal Deer, MD as Consulting Physician (Cardiology) Kathyrn Sheriff, [facility] as Pharmacist (Pharmacist) Clinton Gallant, RN as [facility] Care Management  Recent  office visits: 10/26/20 Dr Yetta Barre OV: chronic f/u; A1c up to 8.4. Rx'd Soliqua 20 units. Rx'd fluconazole. Dc'd Vascepa. LDL improved 177 > 126 w/ ezetimibe.  Recent consult visits: N/a  Hospital visits: None in previous 6 months   Objective:  Lab Results  Component Value Date   CREATININE 0.99 10/26/2020   BUN 12 10/26/2020   GFR 79.19 10/26/2020   GFRNONAA >60 08/19/2008   GFRAA  08/19/2008    >60        The eGFR has been calculated using the MDRD equation. This calculation has not been validated in all clinical   NA 134 (L) 10/26/2020   K 4.8 10/26/2020   CALCIUM 9.9 10/26/2020   CO2 27 10/26/2020   GLUCOSE 162 (H) 10/26/2020    Lab Results  Component Value Date/Time   HGBA1C 8.4 (H) 10/26/2020 11:58 AM   HGBA1C 6.3 (A) 04/22/2020 02:08 PM   HGBA1C 7.5 (H) 12/22/2019 11:20 AM   GFR 79.19 10/26/2020 11:58 AM   GFR 69.83 04/08/2020 04:06 PM   MICROALBUR <0.7 10/26/2020 11:58 AM   MICROALBUR <0.7 06/24/2019 10:24 AM    Last diabetic Eye exam:  Lab Results  Component Value Date/Time   HMDIABEYEEXA No Retinopathy 05/19/2020 12:00 AM    Last diabetic Foot exam:  Lab Results  Component Value Date/Time   HMDIABFOOTEX done 11/23/2010 12:00 AM     Lab Results  Component Value Date   CHOL 196 10/26/2020   HDL 38.40 (L) 10/26/2020   LDLCALC 35 01/03/2017   LDLDIRECT 126.0 10/26/2020   TRIG 263.0 (H) 10/26/2020   CHOLHDL  5 10/26/2020    Hepatic Function Latest Ref Rng & Units 10/26/2020 06/24/2019 01/03/2017  Total Protein 6.0 - 8.3 g/dL 6.6 6.7 6.5  Albumin 3.5 - 5.2 g/dL 4.6 4.5 4.5  AST 0 - 37 U/L $Remo'25 17 20  'EbCou$ ALT 0 - 53 U/L 52 30 35  Alk Phosphatase 39 - 117 U/L 82 81 80  Total Bilirubin 0.2 - 1.2 mg/dL 0.5 0.4 0.4  Bilirubin, Direct 0.0 - 0.3 mg/dL 0.1 0.1 0.14    Lab Results  Component Value Date/Time   TSH 2.62 10/26/2020 11:58 AM   TSH 2.00 06/24/2019 10:24 AM    CBC Latest Ref Rng & Units 10/26/2020 06/24/2019 12/12/2017  WBC 4.0 - 10.5 K/uL 8.8 9.4  9.4  Hemoglobin 13.0 - 17.0 g/dL 14.9 14.7 15.9  Hematocrit 39.0 - 52.0 % 43.3 42.5 45.6  Platelets 150.0 - 400.0 K/uL 260.0 282.0 265.0    Lab Results  Component Value Date/Time   VD25OH 34.31 06/24/2019 10:24 AM   VD25OH 22.23 (L) 06/12/2018 01:58 PM    Clinical ASCVD: No  The 10-year ASCVD risk score Mikey Bussing DC Jr., et al., 2013) is: 30.7%   Values used to calculate the score:     Age: 35 years     Sex: Male     Is Non-Hispanic African American: No     Diabetic: Yes     Tobacco smoker: No     Systolic Blood Pressure: 185 mmHg     Is BP treated: Yes     HDL Cholesterol: 38.4 mg/dL     Total Cholesterol: 196 mg/dL    Depression screen [facility] 2/9 01/28/2021 11/26/2020 04/22/2020  Decreased Interest 0 0 0  Down, Depressed, Hopeless 0 0 0  PHQ - 2 Score 0 0 0  Altered sleeping - - -  Tired, decreased energy - - -  Change in appetite - - -  Feeling bad or failure about yourself  - - -  Trouble concentrating - - -  Moving slowly or fidgety/restless - - -  Suicidal thoughts - - -  PHQ-9 Score - - -  Difficult doing work/chores - - -     Social History   Tobacco Use  Smoking Status Former   Packs/day: 1.00   Years: 16.00   Pack years: 16.00   Types: Cigarettes   Quit date: 10/17/2003   Years since quitting: 17.4  Smokeless Tobacco Never   BP Readings from Last 3 Encounters:  11/12/20 120/84  10/26/20 128/84  04/22/20 130/80   Pulse Readings from Last 3 Encounters:  11/12/20 (!) 101  10/26/20 86  04/22/20 87   Wt Readings from Last 3 Encounters:  11/12/20 222 lb (100.7 kg)  10/26/20 227 lb (103 kg)  04/22/20 217 lb (98.4 kg)   BMI Readings from Last 3 Encounters:  11/12/20 34.77 kg/m  10/26/20 35.55 kg/m  04/22/20 33.99 kg/m    Assessment/Interventions: Review of patient past medical history, allergies, medications, health status, including review of consultants reports, laboratory and other test data, was performed as part of comprehensive evaluation and  provision of chronic care management services.   SDOH:  (Social Determinants of Health) assessments and interventions performed: Yes  SDOH Screenings   Alcohol Screen: Low Risk    Last Alcohol Screening Score (AUDIT): 0  Depression (PHQ2-9): Low Risk    PHQ-2 Score: 0  Financial Resource Strain: Low Risk    Difficulty of Paying Living Expenses: Not hard at all  Food Insecurity: No Food  Insecurity   Worried About Charity fundraiser in the Last Year: Never true   Ran Out of Food in the Last Year: Never true  Housing: Low Risk    Last Housing Risk Score: 0  Physical Activity: Sufficiently Active   Days of Exercise per Week: 5 days   Minutes of Exercise per Session: 30 min  Social Connections: Socially Isolated   Frequency of Communication with Friends and Family: More than three times a week   Frequency of Social Gatherings with Friends and Family: Once a week   Attends Religious Services: Never   Marine scientist or Organizations: No   Attends Archivist Meetings: Never   Marital Status: Widowed  Stress: No Stress Concern Present   Feeling of Stress : Only a little  Tobacco Use: Medium Risk   Smoking Tobacco Use: Former   Smokeless Tobacco Use: Never  Transportation Needs: No Data processing manager (Medical): No   Lack of Transportation (Non-Medical): No    CCM Care Plan  Allergies  Allergen Reactions   Latex    Morphine    Statins     REACTION: weakness, muscle pains    Medications Reviewed Today     Reviewed by Charlton Haws, [facility] (Pharmacist) on 04/06/21 at 1417  Med List Status: <None>   Medication Order Taking? Sig Documenting Provider Last Dose Status Informant  aspirin 81 MG EC tablet [id number] Yes Take 81 mg by mouth daily. [provider] Taking Active   Discontinued 04/06/21 1417 (Patient Preference)   Blood Glucose Monitoring Suppl (ONETOUCH VERIO) w/Device KIT [id number] Yes 1 Act by Does not apply  route in the morning and at bedtime. [name], MD Taking Active   [facility] D3 50 MCG [phone]) CAPS [id number] Yes TAKE 2 CAPSULES (4,000 UNITS TOTAL) BY MOUTH DAILY. [name], MD Taking Active   ezetimibe (ZETIA) 10 MG tablet [id number] Yes Take 1 tablet (10 mg total) by mouth daily. [name], MD Taking Active   famotidine (PEPCID) 40 MG tablet [id number] Yes TAKE 1 TABLET BY MOUTH EVERY DAY [name], MD Taking Active   fexofenadine (ALLEGRA) 180 MG tablet [id number] Yes Take 180 mg by mouth daily as needed for allergies. [provider] Taking Active   glucose blood (ONETOUCH VERIO) test strip [id number] Yes 1 each by Other route in the morning and at bedtime. Use as instructed [name], MD Taking Active   hydrochlorothiazide (MICROZIDE) 12.5 MG capsule [id number] Yes TAKE 1 CAPSULE BY MOUTH EVERY DAY [name], MD Taking Active   Insulin Glargine-Lixisenatide [facility]) 100-33 UNT-MCG/ML SOPN [id number] Yes Inject 20 Units into the skin daily.  Patient taking differently: Inject 10 Units into the skin daily.   [name], MD Taking Active            Med Note Malena Catholic Apr 06, 2021  2:14 PM) Not taking daily, more like once a week when fasting BG > 160  Insulin Pen Needle 32G X 6 MM MISC [id number] Yes 1 Act by Does not apply route daily. [name], MD Taking Active   KLOR-CON M20 20 MEQ tablet [id number] Yes TAKE 1 TABLET BY MOUTH TWICE A DAY [name], MD Taking Active   Lancets [facility] ULTRASOFT) lancets [id number] Yes  [provider] Taking Active   metFORMIN (GLUCOPHAGE) 1000 MG tablet [id number] Yes TAKE 1 TABLET (1,000 MG TOTAL)  BY MOUTH 2 (TWO) TIMES DAILY WITH A MEAL. [name], MD Taking Active   NON FORMULARY [id number] Yes Oxygen continuous 1.5 liters with activity [provider] Taking Active   olmesartan (BENICAR) 40 MG tablet [id number] Yes TAKE 1 TABLET BY MOUTH EVERY DAY [name], MD  Taking Active   [facility] VERIO test strip [id number] Yes USE 1 PER DAY Renato Shin, MD Taking Active   STIOLTO RESPIMAT 2.5-2.5 MCG/ACT AERS [id number] Yes INHALE 2 PUFFS BY MOUTH INTO THE LUNGS DAILY Chesley Mires, MD Taking Active            Med Note Malena Catholic Apr 06, 2021  2:16 PM) Using PRN            Patient Active Problem List   Diagnosis Date Noted   Shingles outbreak 11/12/2020   Onychomycosis of right great toe 10/26/2020   Encounter for general adult medical examination with abnormal findings 10/26/2020   Heme positive stool 04/15/2020   Statin intolerance 03/23/2020   Vitamin D deficiency disease 06/12/2018   GERD with esophagitis 12/12/2017   Thoracic aortic aneurysm without rupture ([place]) 12/12/2017   Screen for colon cancer 01/12/2016   Routine general medical examination at a health care facility 07/16/2015   Benign prostatic hyperplasia 07/13/2014   Type II diabetes mellitus with manifestations ([place]) 11/12/2013   Pure hyperglyceridemia 07/09/2013   Upper airway cough syndrome 02/06/2012   Hyperlipidemia with target LDL less than 130 07/05/2011   EMPHYSEMA 12/07/2008   Essential hypertension, benign 08/26/2008   INTERSTITIAL LUNG DISEASE 08/26/2008   Respiratory failure with hypoxia ([place]) 08/26/2008    Immunization History  Administered Date(s) Administered   Fluad Quad(high Dose 65+) 06/24/2019   Influenza Split 07/05/2011, 07/16/2012   Influenza Whole 07/21/2009, 08/22/2010   Influenza,inj,Quad PF,6+ Mos 06/25/2013, 07/13/2014, 07/15/2015, 08/08/2016, 06/11/2017, 06/12/2018   Influenza-Unspecified 07/16/2020   Moderna Sars-Covid-2 Vaccination 12/16/2019, 01/11/2020   Pneumococcal Conjugate-13 06/25/2013   Pneumococcal Polysaccharide-23 08/17/2008, 08/08/2016   Td 10/16/2005   Tdap 01/12/2016   Zoster, Live 05/08/2016    Conditions to be addressed/monitored:  Hypertension, Hyperlipidemia, and Diabetes, Lung disease  Care Plan : [place]  Updates made by Charlton Haws, [place] since 04/06/2021 12:00 AM     Problem: Hypertension, Hyperlipidemia, and Diabetes, Lung disease   Priority: High     Long-Range Goal: Disease management   Start Date: 04/06/2021  Expected End Date: 10/06/2021  This Visit's Progress: On track  Priority: High  Note:   Current Barriers:  Unable to independently monitor therapeutic efficacy Does not adhere to prescribed medication regimen  Pharmacist Clinical Goal(s):  Patient will achieve adherence to monitoring guidelines and medication adherence to achieve therapeutic efficacy adhere to prescribed medication regimen as evidenced by patient report through collaboration with PharmD and provider.   Interventions: 1:1 collaboration with [name], MD regarding development and update of comprehensive plan of care as evidenced by provider attestation and co-signature Inter-disciplinary care team collaboration (see longitudinal plan of care) Comprehensive medication review performed; medication list updated in electronic medical record  Hypertension    BP goal is:  <130/80 Patient checks BP at home several times per month Patient home BP readings are ranging: <130/80   Patient has failed these meds in the past: n/a Patient is currently controlled on the following medications: Olmesartan 40 mg daily HCTZ 12.5 mg daily   We discussed: pt endorsed compliance and denies issues;  Plan: Continue current  medications and control with diet and exercise    Hyperlipidemia    LDL goal 30% reduction (<130) Statin intolerant   Patient has failed these meds in past: Praluent, Nexlizet, Repatha ($$), rosuvastatin TIW, atorvastatin Patient is currently uncontrolled on the following medications: Ezetimibe 10 mg daily Aspirin 81 mg daily OTC Krill Oil   We discussed:  LDL improved from 177 to 126 while on ezetimibe (29% reduction); pt has not tolerated statins or PCSK9  previously; pt endorses adherence and denies side effects   Plan: Continue control with diet and exercise    Diabetes    A1c goal <7% Checking BG: 2-4 times daily Recent FBG Readings: 140-155 7-day avg: 147 14-day avg: 50 30-day avg: 157 BG was 88 once and felt hypoglycemic sx   Patient has failed these meds in past: n/a Patient is currently controlled on the following medications: Metformin 1000 mg BID Soliqua 20 units daily - not taking daily Test strips   We discussed: pt is not taking soliqua much, about once every 1-2 weeks only when fasting BG is elevated (160-190); sometimes when BG is lower (100s) he takes 1/2 metformin dose; discussed blood sugar goal (fasting BG <130) and importance of achieving this goal; pt is more worried about low sugars and being able to control sugars with diet  Plan:  Reduce Soliqua to 10 units and take daily Check fasting BG daily and post-prandial BG 2 hours after eating   Interstitial Lung Disease / Emphysema    Follows with Dr Halford Chessman Last spirometry score: 07/01/2018 -FEV1 119% predicted -FEV1/FVC 0.80 -FEV1 % change: 0%    Patient has failed these meds in past: n/a Patient is currently uncontrolled on the following medications: Stiolto Respimat 2 puffs daily (Dr Halford Chessman) O2 1.5 L w/ activity   Using maintenance inhaler regularly? No Frequency of rescue inhaler use:  never   We discussed:  Pt only uses inhaler when is O2 sat drops < 90%; he has previously tried taking Stiolto daily for a month and noticed no significant change in breathing;    Plan: Continue current medications    Patient Goals/Self-Care Activities Patient will:  - take medications as prescribed focus on medication adherence by routine check glucose 2-4 times daily, document, and provide at future appointments engage in dietary modifications by increasing vegetables, reducing carbs      Medication Assistance: None required.  Patient affirms current coverage meets  needs.  Compliance/Adherence/Medication fill history: Care Gaps: Shingrix Covid booster (due 06/12/20) Colonoscopy (due 02/14/17)  Star-Rating Drugs: Metformin - LF 02/18/21 x 90 ds Olmesartan - LF 02/19/21 x 90 ds  Patient's preferred pharmacy is:  [id number] [place], [place] - [address] [place] [address] Phone: [phone] Fax: [phone]  Uses pill box? Yes Pt endorses 100% compliance  We discussed: Current pharmacy is preferred with insurance plan and patient is satisfied with pharmacy services Patient decided to: Continue current medication management strategy  Care Plan and Follow Up Patient Decision:  Patient agrees to Care Plan and Follow-up.  Plan: Telephone follow up appointment with care management team member scheduled for:  3 months  Charlene Brooke, PharmD, [place], CPP Clinical Pharmacist [facility] [phone]

## 2021-04-08 ENCOUNTER — Other Ambulatory Visit: Payer: Self-pay | Admitting: Internal Medicine

## 2021-04-08 ENCOUNTER — Other Ambulatory Visit: Payer: Self-pay

## 2021-04-08 ENCOUNTER — Other Ambulatory Visit (INDEPENDENT_AMBULATORY_CARE_PROVIDER_SITE_OTHER): Payer: Medicare Other

## 2021-04-08 ENCOUNTER — Ambulatory Visit
Admission: RE | Admit: 2021-04-08 | Discharge: 2021-04-08 | Disposition: A | Discharge status: Home / Self Care | Payer: Medicare Other | Source: Ambulatory Visit | Attending: Internal Medicine | Admitting: Internal Medicine

## 2021-04-08 DIAGNOSIS — I10 Essential (primary) hypertension: Secondary | ICD-10-CM

## 2021-04-08 DIAGNOSIS — E118 Type 2 diabetes mellitus with unspecified complications: Secondary | ICD-10-CM

## 2021-04-08 DIAGNOSIS — E876 Hypokalemia: Secondary | ICD-10-CM

## 2021-04-08 DIAGNOSIS — I712 Thoracic aortic aneurysm, without rupture, unspecified: Secondary | ICD-10-CM

## 2021-04-08 LAB — BASIC METABOLIC PANEL
BUN: 14 mg/dL (ref 6–23)
CO2: 22 mEq/L (ref 19–32)
Calcium: 9.8 mg/dL (ref 8.4–10.5)
Chloride: 103 mEq/L (ref 96–112)
Creatinine, Ser: 1.08 mg/dL (ref 0.40–1.50)
GFR: 71.12 mL/min (ref >60.00)
Glucose, Bld: 113 mg/dL — ABNORMAL HIGH (ref 70–99)
Potassium: 4.2 mEq/L (ref 3.5–5.1)
Sodium: 134 mEq/L — ABNORMAL LOW (ref 135–145)

## 2021-04-08 LAB — HEMOGLOBIN A1C: Hgb A1c MFr Bld: 7.2 % — ABNORMAL HIGH (ref 4.6–6.5)

## 2021-04-14 ENCOUNTER — Ambulatory Visit (INDEPENDENT_AMBULATORY_CARE_PROVIDER_SITE_OTHER)
Admission: RE | Admit: 2021-04-14 | Discharge: 2021-04-14 | Disposition: A | Discharge status: Home / Self Care | Payer: Medicare Other | Source: Ambulatory Visit | Attending: Internal Medicine | Admitting: Internal Medicine

## 2021-04-14 ENCOUNTER — Other Ambulatory Visit: Payer: Self-pay

## 2021-04-14 DIAGNOSIS — I712 Thoracic aortic aneurysm, without rupture: Secondary | ICD-10-CM | POA: Diagnosis not present

## 2021-04-14 MED ORDER — IOHEXOL 350 MG/ML SOLN
100.0000 mL | Freq: Once | INTRAVENOUS | Status: AC | PRN
  Administered 2021-04-14: 100 mL via INTRAVENOUS

## 2021-04-15 ENCOUNTER — Encounter: Payer: Self-pay | Admitting: Internal Medicine

## 2021-04-15 DIAGNOSIS — I10 Essential (primary) hypertension: Secondary | ICD-10-CM | POA: Diagnosis not present

## 2021-04-15 DIAGNOSIS — R0902 Hypoxemia: Secondary | ICD-10-CM | POA: Diagnosis not present

## 2021-04-25 ENCOUNTER — Encounter: Payer: Self-pay | Admitting: Internal Medicine

## 2021-04-25 ENCOUNTER — Other Ambulatory Visit: Payer: Self-pay

## 2021-04-25 ENCOUNTER — Ambulatory Visit (INDEPENDENT_AMBULATORY_CARE_PROVIDER_SITE_OTHER): Payer: Medicare Other | Admitting: Internal Medicine

## 2021-04-25 VITALS — BP 120/86 | HR 93 | Temp 98.5°F | Resp 16 | Ht 67.0 in | Wt 219.6 lb

## 2021-04-25 DIAGNOSIS — J9611 Chronic respiratory failure with hypoxia: Secondary | ICD-10-CM | POA: Diagnosis not present

## 2021-04-25 DIAGNOSIS — I10 Essential (primary) hypertension: Secondary | ICD-10-CM | POA: Diagnosis not present

## 2021-04-25 DIAGNOSIS — T466X5A Adverse effect of antihyperlipidemic and antiarteriosclerotic drugs, initial encounter: Secondary | ICD-10-CM | POA: Diagnosis not present

## 2021-04-25 DIAGNOSIS — J438 Other emphysema: Secondary | ICD-10-CM

## 2021-04-25 DIAGNOSIS — Z1211 Encounter for screening for malignant neoplasm of colon: Secondary | ICD-10-CM

## 2021-04-25 DIAGNOSIS — G720 Drug-induced myopathy: Secondary | ICD-10-CM | POA: Diagnosis not present

## 2021-04-25 DIAGNOSIS — J8410 Pulmonary fibrosis, unspecified: Secondary | ICD-10-CM

## 2021-04-25 DIAGNOSIS — E118 Type 2 diabetes mellitus with unspecified complications: Secondary | ICD-10-CM

## 2021-04-25 DIAGNOSIS — Z23 Encounter for immunization: Secondary | ICD-10-CM

## 2021-04-25 MED ORDER — SHINGRIX 50 MCG/0.5ML IM SUSR: 0.5000 mL | Freq: Once | INTRAMUSCULAR | 1 refills | Status: AC

## 2021-04-25 MED ORDER — STIOLTO RESPIMAT 2.5-2.5 MCG/ACT IN AERS
2.0000 | INHALATION_SPRAY | Freq: Every day | RESPIRATORY_TRACT | 1 refills | Status: DC
Start: 2021-04-25 — End: 2022-04-26

## 2021-04-25 MED ORDER — STIOLTO RESPIMAT 2.5-2.5 MCG/ACT IN AERS: 2.0000 | INHALATION_SPRAY | Freq: Every day | RESPIRATORY_TRACT | 1 refills | Status: DC

## 2021-04-25 NOTE — Patient Instructions (Signed)

## 2021-04-25 NOTE — Progress Notes (Signed)
Subjective:  Patient ID: [name], male    DOB: [birth year]  Age: 65 y.o. MRN: [id number]  CC: Hypertension and Diabetes  This visit occurred during the SARS-CoV-2 public health emergency.  Safety protocols were in place, including screening questions prior to the visit, additional usage of staff PPE, and extensive cleaning of exam room while observing appropriate contact time as indicated for disinfecting solutions.    HPI [name] presents for f/up -   He needs a refill the inhaler for COPD. He has mild SOB/wheezing but no cough. His BS's have been well controlled and he is no longer using soliqua.  Outpatient Medications Prior to Visit  Medication Sig Dispense Refill   aspirin 81 MG EC tablet Take 81 mg by mouth daily.     Blood Glucose Monitoring Suppl (ONETOUCH VERIO) w/Device KIT 1 Act by Does not apply route in the morning and at bedtime. 1 kit 2   CVS D3 50 MCG (2000 UT) CAPS TAKE 2 CAPSULES (4,000 UNITS TOTAL) BY MOUTH DAILY. 180 capsule 1   ezetimibe (ZETIA) 10 MG tablet Take 1 tablet (10 mg total) by mouth daily. 90 tablet 3   famotidine (PEPCID) 40 MG tablet TAKE 1 TABLET BY MOUTH EVERY DAY 90 tablet 1   fexofenadine (ALLEGRA) 180 MG tablet Take 180 mg by mouth daily as needed for allergies.     glucose blood (ONETOUCH VERIO) test strip 1 each by Other route in the morning and at bedtime. Use as instructed 200 each 3   hydrochlorothiazide (MICROZIDE) 12.5 MG capsule TAKE 1 CAPSULE BY MOUTH EVERY DAY 90 capsule 1   Insulin Pen Needle 32G X 6 MM MISC 1 Act by Does not apply route daily. 100 each 1   KLOR-CON M20 20 MEQ tablet TAKE 1 TABLET BY MOUTH TWICE A DAY 180 tablet 0   Lancets (ONETOUCH ULTRASOFT) lancets      metFORMIN (GLUCOPHAGE) 1000 MG tablet TAKE 1 TABLET (1,000 MG TOTAL) BY MOUTH 2 (TWO) TIMES DAILY WITH A MEAL. 180 tablet 1   NON FORMULARY Oxygen continuous 1.5 liters with activity     olmesartan (BENICAR) 40 MG tablet TAKE 1 TABLET BY MOUTH EVERY DAY 90  tablet 1   ONETOUCH VERIO test strip USE 1 PER DAY 100 each 2   Insulin Glargine-Lixisenatide (SOLIQUA) 100-33 UNT-MCG/ML SOPN Inject 20 Units into the skin daily. (Patient taking differently: Inject 10 Units into the skin daily.) 9 mL 1   STIOLTO RESPIMAT 2.5-2.5 MCG/ACT AERS INHALE 2 PUFFS BY MOUTH INTO THE LUNGS DAILY 4 g 1   No facility-administered medications prior to visit.    ROS Review of Systems  Constitutional:  Negative for diaphoresis, fatigue and unexpected weight change.  HENT: Negative.    Eyes: Negative.   Respiratory:  Positive for shortness of breath and wheezing. Negative for cough, chest tightness and stridor.   Cardiovascular:  Negative for chest pain, palpitations and leg swelling.  Gastrointestinal:  Negative for abdominal pain, constipation, diarrhea, nausea and vomiting.  Endocrine: Negative.   Genitourinary: Negative.  Negative for difficulty urinating.  Musculoskeletal: Negative.  Negative for arthralgias and myalgias.  Skin: Negative.  Negative for color change and pallor.  Neurological: Negative.  Negative for dizziness, weakness and light-headedness.  Hematological:  Negative for adenopathy. Does not bruise/bleed easily.   Objective:  BP 120/86 (BP Location: Left Arm, Patient Position: Sitting, Cuff Size: Large)   Pulse 93   Temp 98.5 F (36.9 C) (Oral)  Resp 16   Ht $R'5\' 7"'vC$  (1.702 m)   Wt 219 lb 9.6 oz (99.6 kg)   SpO2 98%   BMI 34.39 kg/m   BP Readings from Last 3 Encounters:  04/25/21 120/86  11/12/20 120/84  10/26/20 128/84    Wt Readings from Last 3 Encounters:  04/25/21 219 lb 9.6 oz (99.6 kg)  11/12/20 222 lb (100.7 kg)  10/26/20 227 lb (103 kg)    Physical Exam Vitals reviewed.  Constitutional:      General: He is not in acute distress.    Appearance: He is ill-appearing (on O2). He is not toxic-appearing or diaphoretic.  HENT:     Nose: Nose normal.     Mouth/Throat:     Mouth: Mucous membranes are moist.  Eyes:      General: No scleral icterus.    Conjunctiva/sclera: Conjunctivae normal.  Cardiovascular:     Rate and Rhythm: Normal rate and regular rhythm.     Heart sounds: No murmur heard. Pulmonary:     Effort: Pulmonary effort is normal.     Breath sounds: No stridor. No wheezing, rhonchi or rales.  Abdominal:     General: Abdomen is protuberant. Bowel sounds are normal. There is no distension.     Palpations: Abdomen is soft. There is no hepatomegaly, splenomegaly or mass.     Tenderness: There is no abdominal tenderness.  Musculoskeletal:        General: Normal range of motion.     Cervical back: Neck supple.     Right lower leg: No edema.     Left lower leg: No edema.  Lymphadenopathy:     Cervical: No cervical adenopathy.  Skin:    General: Skin is warm and dry.  Neurological:     General: No focal deficit present.     Mental Status: He is alert.    Lab Results  Component Value Date   WBC 8.8 10/26/2020   HGB 14.9 10/26/2020   HCT 43.3 10/26/2020   PLT 260.0 10/26/2020   GLUCOSE 113 (H) 04/08/2021   CHOL 196 10/26/2020   TRIG 263.0 (H) 10/26/2020   HDL 38.40 (L) 10/26/2020   LDLDIRECT 126.0 10/26/2020   LDLCALC 35 01/03/2017   ALT 52 10/26/2020   AST 25 10/26/2020   NA 134 (L) 04/08/2021   K 4.2 04/08/2021   CL 103 04/08/2021   CREATININE 1.08 04/08/2021   BUN 14 04/08/2021   CO2 22 04/08/2021   TSH 2.62 10/26/2020   PSA 0.52 10/26/2020   INR 1.2 08/04/2008   HGBA1C 7.2 (H) 04/08/2021   MICROALBUR <0.7 10/26/2020    CT ANGIO CHEST AORTA W/CM & OR WO/CM  Result Date: 04/15/2021 CLINICAL DATA:  Interstitial pneumonitis, thoracic aortic aneurysm EXAM: CT ANGIOGRAPHY CHEST WITH CONTRAST TECHNIQUE: Multidetector CT imaging of the chest was performed using the standard protocol during bolus administration of intravenous contrast. Multiplanar CT image reconstructions and MIPs were obtained to evaluate the vascular anatomy. CONTRAST:  160mL OMNIPAQUE IOHEXOL 350 MG/ML SOLN  COMPARISON:  04/15/2020 FINDINGS: Cardiovascular: Similar atherosclerosis of the thoracic aorta. Negative for dissection. Mild fusiform aneurysmal dilatation of the ascending thoracic aorta, maximal diameter 4 cm, previously 3.6 cm. Patent 3 vessel arch anatomy. Central pulmonary arteries are normal in caliber and patent. No significant filling defect or pulmonary embolus. Normal heart size.  No pericardial effusion. Central venous structures appear patent.  No veno-occlusive process. Mediastinum/Nodes: Thyroid unremarkable. Trachea and central airways are patent. Esophagus nondilated. No hiatal hernia. Similar  scattered small nonenlarged mediastinal and hilar lymph nodes. No bulky adenopathy. Lungs/Pleura: Similar pattern of mixed paraseptal and centrilobular emphysema with scattered areas of subpleural blebs bilaterally. Minimal posteromedial subpleural bandlike nodular scarring in the right upper lobe, slightly worse. Chronic posterolateral pleural thickening with pleural fat proliferation, unchanged. No pleural effusion or pneumothorax. No superimposed acute airspace process, collapse or consolidation. Upper Abdomen: No acute upper abdominal finding. Musculoskeletal: No acute osseous finding. Review of the MIP images confirms the above findings. IMPRESSION: Similar mild aneurysmal dilatation of the ascending thoracic aorta, maximal diameter 4 cm. Recommend annual imaging followup by CTA or MRA. This recommendation follows 2010 ACCF/AHA/AATS/ACR/ASA/SCA/SCAI/SIR/STS/SVM Guidelines for the Diagnosis and Management of Patients with Thoracic Aortic Disease. Circulation. 2010; 121: D782-U235. Aortic aneurysm NOS (ICD10-I71.9) No other acute intrathoracic vascular finding. Negative for acute pulmonary embolus. No dissection. Stable severe mixed paraseptal and centrilobular emphysema pattern with pleuroparenchymal scarring. Minimal increased right upper lobe posteromedial subpleural mild nodular scarring noted. No  superimposed acute airspace process. Aortic Atherosclerosis (ICD10-I70.0) and Emphysema (ICD10-J43.9). Electronically Signed   By: Jerilynn Mages.  Shick M.D.   On: 04/15/2021 08:36    Assessment & Plan:   [name] was seen today for hypertension and diabetes.  Diagnoses and all orders for this visit:  Statin myopathy- He is not willing to take a statin.  Chronic respiratory failure with hypoxia (HCC) -     Discontinue: Tiotropium Bromide-Olodaterol (STIOLTO RESPIMAT) 2.5-2.5 MCG/ACT AERS; Inhale 2 puffs into the lungs daily. -     Tiotropium Bromide-Olodaterol (STIOLTO RESPIMAT) 2.5-2.5 MCG/ACT AERS; Inhale 2 puffs into the lungs daily.  Postinflammatory pulmonary fibrosis (HCC) -     Discontinue: Tiotropium Bromide-Olodaterol (STIOLTO RESPIMAT) 2.5-2.5 MCG/ACT AERS; Inhale 2 puffs into the lungs daily. -     Tiotropium Bromide-Olodaterol (STIOLTO RESPIMAT) 2.5-2.5 MCG/ACT AERS; Inhale 2 puffs into the lungs daily.  Essential hypertension, benign- His BP is well controlled.  Type II diabetes mellitus with manifestations ([place])- His blood sugars are well controlled.  EMPHYSEMA -     Tiotropium Bromide-Olodaterol (STIOLTO RESPIMAT) 2.5-2.5 MCG/ACT AERS; Inhale 2 puffs into the lungs daily.  I have discontinued Itzael Liptak. Schlarb's Soliqua. I am also having him maintain his aspirin, NON FORMULARY, fexofenadine, OneTouch Verio, CVS D3, ezetimibe, Insulin Pen Needle, olmesartan, famotidine, metFORMIN, hydrochlorothiazide, onetouch ultrasoft, OneTouch Verio, OneTouch Verio, Klor-Con M20, and Stiolto Respimat.  Meds ordered this encounter  Medications   DISCONTD: Tiotropium Bromide-Olodaterol (STIOLTO RESPIMAT) 2.5-2.5 MCG/ACT AERS    Sig: Inhale 2 puffs into the lungs daily.    Dispense:  12 g    Refill:  1    Needs ov for future refills   Tiotropium Bromide-Olodaterol (STIOLTO RESPIMAT) 2.5-2.5 MCG/ACT AERS    Sig: Inhale 2 puffs into the lungs daily.    Dispense:  12 g    Refill:  1    Needs ov for  future refills      Follow-up: No follow-ups on file.  Scarlette Calico, MD

## 2021-05-09 ENCOUNTER — Other Ambulatory Visit: Payer: Self-pay

## 2021-05-09 ENCOUNTER — Other Ambulatory Visit: Payer: Self-pay | Admitting: *Deleted

## 2021-05-09 NOTE — Patient Outreach (Signed)
[facility]) Care Management  05/09/2021  [name] 1955/12/14 [id number]   [facility] outreach to complex care patient   [name] was referred to [facility] on 10/27/20 Referral Source:  MD referral  Referral Reason: poorly controlled DM 2  Insurance:  [place] care [facility])   Patient is able to verify HIPAA ([place] and [place]) identifiers Reviewed and addressed the purpose of the follow up call with the patient  Consent: [facility] ([place]) RN CM reviewed [facility] services with patient. Patient gave verbal consent for services.    Assessment [name] reports he is doing well and is becoming more active in his garden He discusses the various vegetables and fruit he has access to   Diabetes Cbg range 104-140 which is better Still noted cbg lows before dinner  RN CM encourage more proteins or larger meals for breakfast or lunch or 6 smaller meals spread out through out the day  This am cbg 135 at 0653  Last HgA1c was 7.2  Averages  7 days 146  14 days 144  30 142 90 days 153    Shingles  He received the first Shingle shot around 7/11-7/14/22 Entered in [place] and discussed follow up shot around 06/17/21  THN progression  Reviewed [facility] disease management services [name] continues to prefer RN CM outreach and agrees to follow up in 60-90 business days  Past Medical History:  Diagnosis Date   COPD (chronic obstructive pulmonary disease) ([place]) 09/23/08   -PFT 09/23/08 FEV1 2.89(84%), FVC 3.70 (78%), TLC 5.28 (78%), DLCO 64% mixed obstructive and  restrictive pattern.  Spirometry 03/11/09 FEV 3.35 (97%),FVC 4.10 (86%), FEV% 82. Steroid responsive pneumonitis 04/09- required mechanical ventilation during hospitalization - The following hospital test were negative: PCP,ANA,HIV,BAL. - ACE25,RF20,CRP 28,A1AT 263, ESR 109   Depression    Diabetes mellitus without complication (HCC)    Dyslipidemia    Dyslipidemia    GERD  (gastroesophageal reflux disease)    History of skin cancer    Hypertension    Echo 10/09 EF 60%   ILD (interstitial lung disease) ([place])    Panic attack     Plan Patient agrees to care plan and follow up [place] scheduled this patient for another call attempt within 17 business days Pt encouraged to return a call to [facility] RN CM prn'    Goals Addressed               This Visit's Progress     Patient Stated     COMPLETED: [facility]) Make and Keep All Appointments (pt-stated)   On track     Timeframe:  Short-Term Goal Priority:  Medium Start Date:          02/17/21                   Expected End Date:               05/09/21        Goal complete Date 05/09/21   - keep a calendar with appointment dates    Notes:  05/09/21 Has kept all appointments  complete/goal met 02/17/21 denied offer to conference with him to schedule pending annual CT appointment Encouraged to complete task on his own Reviewed upcoming appointments with pcp in July 2022      [facility]) Monitor and Manage My Blood Sugar-Diabetes Type 2 (pt-stated)   On track     Timeframe:  Long-Range Goal Priority:  Medium Start Date:                            11/10/20 Expected End Date:   07/15/21                    Follow Up Date  06/21/21 Barriers: Knowledge   - check blood sugar at prescribed times - take the blood sugar meter to all doctor visits    Notes:   05/09/21 cbg 104-150 still checking 3-4 times a day Stopped taking insulin, largest meal is dinner He states he has trying better to read food labels, manage using serving size, eating more of his vegetables as he does during the summer time HgA1c with improvement and down to 7/2 from 7.5 02/17/21 continues to monitor able to review 7, 14 and 30 day values used to calculate possible HgA1c of 7.5 pending next office lab in July 2022 Last HgA1c of 8.4 on 10/26/20 Discussed a change of activity and increase vegetables in the spring/summer improves his HgA1c as noted per review  of HgA1c trends with patient 01/28/21 still continue to monitor at home Noting some variations 98-191 12/21/20 confirms has new glucose monitor, monitors 2-3 times a day, keeping a log and will take meter to MD office       Joelene Millin L. Lavina Hamman, RN, BSN, [place] Coordinator Office number [phone] Main [facility] number [phone] Fax number [phone]

## 2021-05-16 DIAGNOSIS — I10 Essential (primary) hypertension: Secondary | ICD-10-CM | POA: Diagnosis not present

## 2021-05-16 DIAGNOSIS — R0902 Hypoxemia: Secondary | ICD-10-CM | POA: Diagnosis not present

## 2021-05-18 ENCOUNTER — Other Ambulatory Visit: Payer: Self-pay | Admitting: Internal Medicine

## 2021-05-18 DIAGNOSIS — I10 Essential (primary) hypertension: Secondary | ICD-10-CM

## 2021-05-18 DIAGNOSIS — E118 Type 2 diabetes mellitus with unspecified complications: Secondary | ICD-10-CM

## 2021-06-16 DIAGNOSIS — R0902 Hypoxemia: Secondary | ICD-10-CM | POA: Diagnosis not present

## 2021-06-16 DIAGNOSIS — I10 Essential (primary) hypertension: Secondary | ICD-10-CM | POA: Diagnosis not present

## 2021-06-21 ENCOUNTER — Other Ambulatory Visit: Payer: Self-pay | Admitting: *Deleted

## 2021-06-21 ENCOUNTER — Other Ambulatory Visit: Payer: Self-pay

## 2021-06-21 NOTE — Patient Outreach (Signed)
[facility]) Care Management  06/21/2021  [name] 22-Sep-1956 [id number]   [facility] outreach to complex care patient   Mr Caraher was referred to [facility] on 10/27/20 Referral Source:  MD referral  Referral Reason: poorly controlled DM 2  Insurance:  [place] care [facility])     Patient is able to verify HIPAA ([place] and [place]) identifiers Reviewed and addressed the purpose of the follow up call with the patient   Consent: [facility] ([place]) RN CM reviewed [facility] services with patient. Patient gave verbal consent for services.       Assessment States he is doing very well except some reported concerns with his pharmacy management  Medicines He reports he received his covid and shingle vaccinations in the same shoulder area but with some residual pain.  He also voices he was charged and does not feel his insurance was filed. He was encouraged to request filing of the vaccination when he is scheduled to return to his local pharmacy or request a reimbursement form from [employer]   He reports difficulty with taking statins for his cholesterol. It causes pain in his legs His chart indicates he has an allergy to statins He is able to tolerate Zetia after a cardiology consult years ago   Diabetes  Discussed xylitol compared with regular sugar  Eye exam scheduled to be completed in October 2022  Plan Patient agrees to care plan and follow up [facility] RN CM scheduled this patient for another call attempt within 36 business days With discussion of THN progression he agrees to one final outreach and plans for possible case closure if needed  Pt encouraged to return a call to [place] CM prn   Goals Addressed               This Visit's Progress     Patient Stated     [facility]) Monitor and Manage My Blood Sugar-Diabetes Type 2 (pt-stated)   On track     Timeframe:  Long-Range Goal Priority:  Medium Start Date:                             11/10/20 Expected End Date:   09/14/21                    Follow Up Date  08/23/21 Barriers: Knowledge   - check blood sugar at prescribed times - take the blood sugar meter to all doctor visits    Notes:   06/21/21 He reports better management with generally a cbg value of 159 and as low as 90-100 in the evenings. He reports his last Hg A1C was  6.2. He reports 1 episode of hypoglycemia with a cbg value of 70 This was resolved with intake of Orange Juice and candy intake  05/09/21 cbg 104-150 still checking 3-4 times a day Stopped taking insulin, largest meal is dinner He states he has trying better to read food labels, manage using serving size, eating more of his vegetables as he does during the summer time HgA1c with improvement and down to 7/2 from 7.5 02/17/21 continues to monitor able to review 7, 14 and 30 day values used to calculate possible HgA1c of 7.5 pending next office lab in July 2022 Last HgA1c of 8.4 on 10/26/20 Discussed a change of activity and increase vegetables in the spring/summer improves his HgA1c as noted per review of HgA1c  trends with patient 01/28/21 still continue to monitor at home Noting some variations 98-191 12/21/20 confirms has new glucose monitor, monitors 2-3 times a day, keeping a log and will take meter to MD office       COMPLETED: Manage My Medicine [facility]) (pt-stated)   On track     Timeframe:  Long-Range Goal Priority:  High Start Date:      04/06/21                       Expected End Date:   10/06/21                    Follow Up Date Sept 6 2022   - call for medicine refill 2 or 3 days before it runs out - call if I am sick and can't take my medicine - keep a list of all the medicines I take; vitamins and herbals too  -Reduce Soliqua to 10 units and take daily -Keep PCP appt 7/11 @ 11 am   Notes:  06/21/21 Managed without issues at this time Goal met/completed 05/09/21 he is doing well without issues         Venisha Boehning L. Lavina Hamman, RN,  BSN, [place] Coordinator Office number [phone] [address] [facility] [place] number [phone] Fax number [phone]

## 2021-06-30 LAB — HM DIABETES EYE EXAM

## 2021-07-03 ENCOUNTER — Other Ambulatory Visit: Payer: Self-pay | Admitting: Internal Medicine

## 2021-07-03 DIAGNOSIS — E876 Hypokalemia: Secondary | ICD-10-CM

## 2021-07-03 DIAGNOSIS — I10 Essential (primary) hypertension: Secondary | ICD-10-CM

## 2021-07-07 ENCOUNTER — Other Ambulatory Visit: Payer: Self-pay

## 2021-07-07 ENCOUNTER — Ambulatory Visit (INDEPENDENT_AMBULATORY_CARE_PROVIDER_SITE_OTHER): Payer: Medicare Other | Admitting: Pharmacist

## 2021-07-07 DIAGNOSIS — I10 Essential (primary) hypertension: Secondary | ICD-10-CM

## 2021-07-07 DIAGNOSIS — G720 Drug-induced myopathy: Secondary | ICD-10-CM

## 2021-07-07 DIAGNOSIS — E785 Hyperlipidemia, unspecified: Secondary | ICD-10-CM

## 2021-07-07 DIAGNOSIS — J9611 Chronic respiratory failure with hypoxia: Secondary | ICD-10-CM

## 2021-07-07 DIAGNOSIS — E118 Type 2 diabetes mellitus with unspecified complications: Secondary | ICD-10-CM

## 2021-07-07 NOTE — Patient Instructions (Signed)
Visit Information  Phone number for Pharmacist: [phone]   Goals Addressed             This Visit's Progress    Eat Healthy       Timeframe:  Long-Range Goal Priority:  High Start Date:      07/07/21                       Expected End Date:  07/07/22                     Follow Up Date March 2023   - drink 6 to 8 glasses of water each day - fill half of plate with vegetables - limit fast food meals to no more than 1 per week - manage portion size - read food labels for fat, fiber, carbohydrates and portion size - switch to sugar-free drinks    Why is this important?   When you are ready to manage your nutrition or weight, having a plan and setting goals will help.  Taking small steps to change how you eat and exercise is a good place to start.    Notes:         Care Plan : CCM Pharmacy Care Plan  Updates made by Kathyrn Sheriff, RPH since 07/07/2021 12:00 AM     Problem: Hypertension, Hyperlipidemia, and Diabetes, Lung disease   Priority: High     Long-Range Goal: Disease management   Start Date: 04/06/2021  Expected End Date: 10/06/2021  This Visit's Progress: On track  Recent Progress: On track  Priority: High  Note:   Current Barriers:  Unable to independently monitor therapeutic efficacy  Pharmacist Clinical Goal(s):  Patient will achieve adherence to monitoring guidelines and medication adherence to achieve therapeutic efficacy  Interventions: 1:1 collaboration with Etta Grandchild, MD regarding development and update of comprehensive plan of care as evidenced by provider attestation and co-signature Inter-disciplinary care team collaboration (see longitudinal plan of care) Comprehensive medication review performed; medication list updated in electronic medical record  Hypertension    BP goal is:  <130/80 Patient checks BP at home several times per month Patient home BP readings are ranging: <130/80   Patient has failed these meds in the  past: n/a Patient is currently controlled on the following medications: Olmesartan 40 mg daily HCTZ 12.5 mg daily   We discussed: pt endorsed compliance and denies issues;  Plan: Continue current medications and control with diet and exercise    Hyperlipidemia    LDL goal 30% reduction (<130) Statin intolerant   Patient has failed these meds in past: Praluent, Nexlizet, Repatha ($$), rosuvastatin TIW, atorvastatin Patient is currently uncontrolled on the following medications: Ezetimibe 10 mg daily Aspirin 81 mg daily OTC Krill Oil   We discussed:  LDL improved from 177 to 126 while on ezetimibe (29% reduction); pt has not tolerated statins or PCSK9 previously; pt endorses adherence and denies side effects   Plan: Continue control with diet and exercise    Diabetes    A1c goal <7% Checking BG: 2x daily Recent FBG Readings: 150, 129-78 afternoon   Patient has failed these meds in past: soliqua Patient is currently controlled on the following medications: Metformin 1000 mg BID Test strips   We discussed: Pt reports BG between 78-150 at home; he denies BG > 80 checking AM and PM. He reports he does better with diet/exercise in the summer when he has his garden to [employer];  he reports in the winter he is more sedentary and does not eat as many fresh vegetables -May consider Farxiga or Rybelsus in the future if further DM control is needed, pt would like to avoid injections  Plan: Recommend to continue current medication   Interstitial Lung Disease / Emphysema    Follows with Dr Craige Cotta Last spirometry score: 07/01/2018 -FEV1 119% predicted -FEV1/FVC 0.80 -FEV1 % change: 0%    Patient has failed these meds in past: n/a Patient is currently uncontrolled on the following medications: Stiolto Respimat 2 puffs daily (Dr Craige Cotta) O2 1.5 L w/ activity   Using maintenance inhaler regularly? Yes Frequency of rescue inhaler use:  never   We discussed:  Pt reports he has been  taking Stiolto daily for the past week since he has a cold; he normally only takes it "every once in a while"; he reports he took it for a month straight once and symptoms were no better   Plan: Continue current medications   Vaccines  Reviewed and discussed patient's vaccination history.   Vaccine gaps: Shingrix #2, flu -Pt reports he had Shingrix 2nd dose on 07/05/21. He has not had flu shot and plans to get it 3-4 weeks after Shingrix  Plan: Flu shot 3-4 weeks after Shingrix  Patient Goals/Self-Care Activities Patient will:  - take medications as prescribed -focus on medication adherence by routine -check glucose 2 times daily -engage in dietary modifications by increasing vegetables, reducing carbs -Get flu vaccine in October       The patient verbalized understanding of instructions, educational materials, and care plan provided today and declined offer to receive copy of patient instructions, educational materials, and care plan.  Telephone follow up appointment with pharmacy team member scheduled for: 6 months  Al Corpus, PharmD, [place], CPP Clinical Pharmacist [facility] [phone]

## 2021-07-07 NOTE — Progress Notes (Signed)
Chronic Care Management Pharmacy Note  07/07/2021 Name:  [name] MRN:  [id number] DOB:  01/18/1956  Summary: -Pt reports BG range 78-150 checking AM and PM -Pt had 2nd Shingrix vaccine 07/05/21 - updated chart  Recommendations/Changes made from today's visit: -Advised to get flu vaccine in October   Subjective: [name] is an 65 y.o. year old male who is a primary patient of [name], MD.  The CCM team was consulted for assistance with disease management and care coordination needs.    Engaged with patient by telephone for follow up visit in response to provider referral for pharmacy case management and/or care coordination services.   Consent to Services:  The patient was given information about Chronic Care Management services, agreed to services, and gave verbal consent prior to initiation of services.  Please see initial visit note for detailed documentation.   Patient Care Team: [name], MD as PCP - General Chesley Mires, MD as Consulting Physician (Pulmonary Disease) End, Harrell Gave, MD as Consulting Physician (Cardiology) Charlton Haws, [facility] as Pharmacist (Pharmacist) Barbaraann Faster, RN as [place] Management  Recent office visits: 04/25/21 Dr Ronnald Ramp OV: off soliqua; refilled Stiolto  10/26/20 Dr Ronnald Ramp OV: chronic f/u; A1c up to 8.4. Rx'd Soliqua 20 units. Rx'd fluconazole. Dc'd Vascepa. LDL improved 177 > 126 w/ ezetimibe.  Recent consult visits: [facility] visits: None in previous 6 months   Objective:  Lab Results  Component Value Date   CREATININE 1.08 04/08/2021   BUN 14 04/08/2021   GFR 71.12 04/08/2021   GFRNONAA >60 08/19/2008   GFRAA  08/19/2008    >60        The eGFR has been calculated using the MDRD equation. This calculation has not been validated in all clinical   NA 134 (L) 04/08/2021   K 4.2 04/08/2021   CALCIUM 9.8 04/08/2021   CO2 22 04/08/2021   GLUCOSE 113 (H) 04/08/2021     Lab Results  Component Value Date/Time   HGBA1C 7.2 (H) 04/08/2021 03:00 PM   HGBA1C 8.4 (H) 10/26/2020 11:58 AM   GFR 71.12 04/08/2021 03:00 PM   GFR 79.19 10/26/2020 11:58 AM   MICROALBUR <0.7 10/26/2020 11:58 AM   MICROALBUR <0.7 06/24/2019 10:24 AM    Last diabetic Eye exam:  Lab Results  Component Value Date/Time   HMDIABEYEEXA No Retinopathy 05/19/2020 12:00 AM    Last diabetic Foot exam:  Lab Results  Component Value Date/Time   HMDIABFOOTEX done 11/23/2010 12:00 AM     Lab Results  Component Value Date   CHOL 196 10/26/2020   HDL 38.40 (L) 10/26/2020   LDLCALC 35 01/03/2017   LDLDIRECT 126.0 10/26/2020   TRIG 263.0 (H) 10/26/2020   CHOLHDL 5 10/26/2020    Hepatic Function Latest Ref Rng & Units 10/26/2020 06/24/2019 01/03/2017  Total Protein 6.0 - 8.3 g/dL 6.6 6.7 6.5  Albumin 3.5 - 5.2 g/dL 4.6 4.5 4.5  AST 0 - 37 U/L $Remo'25 17 20  'cywiD$ ALT 0 - 53 U/L 52 30 35  Alk Phosphatase 39 - 117 U/L 82 81 80  Total Bilirubin 0.2 - 1.2 mg/dL 0.5 0.4 0.4  Bilirubin, Direct 0.0 - 0.3 mg/dL 0.1 0.1 0.14    Lab Results  Component Value Date/Time   TSH 2.62 10/26/2020 11:58 AM   TSH 2.00 06/24/2019 10:24 AM    CBC Latest Ref Rng & Units 10/26/2020 06/24/2019 12/12/2017  WBC 4.0 - 10.5 K/uL 8.8  9.4 9.4  Hemoglobin 13.0 - 17.0 g/dL 14.9 14.7 15.9  Hematocrit 39.0 - 52.0 % 43.3 42.5 45.6  Platelets 150.0 - 400.0 K/uL 260.0 282.0 265.0    Lab Results  Component Value Date/Time   VD25OH 34.31 06/24/2019 10:24 AM   VD25OH 22.23 (L) 06/12/2018 01:58 PM    Clinical ASCVD: No  The 10-year ASCVD risk score (Arnett DK, et al., 2019) is: 30.7%   Values used to calculate the score:     Age: 21 years     Sex: Male     Is Non-Hispanic African American: No     Diabetic: Yes     Tobacco smoker: No     Systolic Blood Pressure: 952 mmHg     Is BP treated: Yes     HDL Cholesterol: 38.4 mg/dL     Total Cholesterol: 196 mg/dL    Depression screen [facility] 2/9 04/25/2021 01/28/2021 11/26/2020   Decreased Interest 0 0 0  Down, Depressed, Hopeless 1 0 0  PHQ - 2 Score 1 0 0  Altered sleeping - - -  Tired, decreased energy - - -  Change in appetite - - -  Feeling bad or failure about yourself  - - -  Trouble concentrating - - -  Moving slowly or fidgety/restless - - -  Suicidal thoughts - - -  PHQ-9 Score - - -  Difficult doing work/chores - - -     Social History   Tobacco Use  Smoking Status Former   Packs/day: 1.00   Years: 16.00   Pack years: 16.00   Types: Cigarettes   Quit date: 10/17/2003   Years since quitting: 17.7  Smokeless Tobacco Never   BP Readings from Last 3 Encounters:  04/25/21 120/86  11/12/20 120/84  10/26/20 128/84   Pulse Readings from Last 3 Encounters:  04/25/21 93  11/12/20 (!) 101  10/26/20 86   Wt Readings from Last 3 Encounters:  04/25/21 219 lb 9.6 oz (99.6 kg)  11/12/20 222 lb (100.7 kg)  10/26/20 227 lb (103 kg)   BMI Readings from Last 3 Encounters:  04/25/21 34.39 kg/m  11/12/20 34.77 kg/m  10/26/20 35.55 kg/m    Assessment/Interventions: Review of patient past medical history, allergies, medications, health status, including review of consultants reports, laboratory and other test data, was performed as part of comprehensive evaluation and provision of chronic care management services.   SDOH:  (Social Determinants of Health) assessments and interventions performed: Yes  SDOH Screenings   Alcohol Screen: Not on file  Depression (PHQ2-9): Low Risk    PHQ-2 Score: 1  Financial Resource Strain: Not on file  Food Insecurity: No Food Insecurity   Worried About Charity fundraiser in the Last Year: Never true   Ran Out of Food in the Last Year: Never true  Housing: Not on file  Physical Activity: Not on file  Social Connections: Not on file  Stress: Not on file  Tobacco Use: Medium Risk   Smoking Tobacco Use: Former   Smokeless Tobacco Use: Never  Transportation Needs: No Transportation Needs   Lack of  Transportation (Medical): No   Lack of Transportation (Non-Medical): No    CCM Care Plan  Allergies  Allergen Reactions   Latex    Morphine    Statins     REACTION: weakness, muscle pains    Medications Reviewed Today     Reviewed by Charlton Haws, [facility] (Pharmacist) on 07/07/21 at 1549  Med List Status: <None>  Medication Order Taking? Sig Documenting Provider Last Dose Status Informant  aspirin 81 MG EC tablet [id number] Yes Take 81 mg by mouth daily. [provider] Taking Active   Blood Glucose Monitoring Suppl (ONETOUCH VERIO) w/Device KIT [id number] Yes 1 Act by Does not apply route in the morning and at bedtime. [name], MD Taking Active   [facility] D3 50 MCG [phone] UT) CAPS [id number] Yes TAKE 2 CAPSULES (4,000 UNITS TOTAL) BY MOUTH DAILY. [name], MD Taking Active   ezetimibe (ZETIA) 10 MG tablet [id number] Yes Take 1 tablet (10 mg total) by mouth daily. [name], MD Taking Active   famotidine (PEPCID) 40 MG tablet [id number] Yes TAKE 1 TABLET BY MOUTH EVERY DAY [name], MD Taking Active   fexofenadine (ALLEGRA) 180 MG tablet [id number] Yes Take 180 mg by mouth daily as needed for allergies. [provider] Taking Active   glucose blood (ONETOUCH VERIO) test strip [id number] Yes 1 each by Other route in the morning and at bedtime. Use as instructed [name], MD Taking Active   hydrochlorothiazide (MICROZIDE) 12.5 MG capsule [id number] Yes TAKE 1 CAPSULE BY MOUTH EVERY DAY [name], MD Taking Active   Insulin Pen Needle 32G X 6 MM MISC [id number] Yes 1 Act by Does not apply route daily. [name], MD Taking Active   KLOR-CON M20 20 MEQ tablet [id number] Yes TAKE 1 TABLET BY MOUTH TWICE A DAY [name], MD Taking Active   Lancets [facility] ULTRASOFT) lancets [id number] Yes  [provider] Taking Active   metFORMIN (GLUCOPHAGE) 1000 MG tablet [id number] Yes TAKE 1 TABLET (1,000 MG TOTAL) BY MOUTH 2 (TWO) TIMES  DAILY WITH A MEAL. [name], MD Taking Active   NON FORMULARY [id number] Yes Oxygen continuous 1.5 liters with activity [provider] Taking Active   olmesartan (BENICAR) 40 MG tablet [id number] Yes TAKE 1 TABLET BY MOUTH EVERY DAY [name], MD Taking Active   [facility] VERIO test strip [id number] Yes USE 1 PER DAY Renato Shin, MD Taking Active   Tiotropium Bromide-Olodaterol (STIOLTO RESPIMAT) 2.5-2.5 MCG/ACT AERS [id number] Yes Inhale 2 puffs into the lungs daily. [name], MD Taking Active             Patient Active Problem List   Diagnosis Date Noted   Statin myopathy 04/25/2021   Encounter for general adult medical examination with abnormal findings 10/26/2020   Vitamin D deficiency disease 06/12/2018   GERD with esophagitis 12/12/2017   Thoracic aortic aneurysm without rupture ([place]) 12/12/2017   Screen for colon cancer 01/12/2016   Routine general medical examination at a health care facility 07/16/2015   Benign prostatic hyperplasia 07/13/2014   Type II diabetes mellitus with manifestations ([place]) 11/12/2013   Pure hyperglyceridemia 07/09/2013   Upper airway cough syndrome 02/06/2012   Hyperlipidemia with target LDL less than 130 07/05/2011   EMPHYSEMA 12/07/2008   Essential hypertension, benign 08/26/2008   INTERSTITIAL LUNG DISEASE 08/26/2008   Respiratory failure with hypoxia ([place]) 08/26/2008    Immunization History  Administered Date(s) Administered   Fluad Quad(high Dose 65+) 06/24/2019   Influenza Split 07/05/2011, 07/16/2012   Influenza Whole 07/21/2009, 08/22/2010   Influenza,inj,Quad PF,6+ Mos 06/25/2013, 07/13/2014, 07/15/2015, 08/08/2016, 06/11/2017, 06/12/2018   Influenza-Unspecified 07/16/2020   Moderna Sars-Covid-2 Vaccination 12/16/2019, 01/11/2020, 03/27/2021   Pneumococcal Conjugate-13 06/25/2013   Pneumococcal Polysaccharide-23 08/17/2008, 08/08/2016   Td 10/16/2005   Tdap 01/12/2016   Zoster Recombinat (Shingrix)  04/28/2021,  07/05/2021   Zoster, Live 05/08/2016    Conditions to be addressed/monitored:  Hypertension, Hyperlipidemia, and Diabetes, Lung disease  Care Plan : [place]  Updates made by Charlton Haws, [place] since 07/07/2021 12:00 AM     Problem: Hypertension, Hyperlipidemia, and Diabetes, Lung disease   Priority: High     Long-Range Goal: Disease management   Start Date: 04/06/2021  Expected End Date: 10/06/2021  This Visit's Progress: On track  Recent Progress: On track  Priority: High  Note:   Current Barriers:  Unable to independently monitor therapeutic efficacy  Pharmacist Clinical Goal(s):  Patient will achieve adherence to monitoring guidelines and medication adherence to achieve therapeutic efficacy  Interventions: 1:1 collaboration with [name], MD regarding development and update of comprehensive plan of care as evidenced by provider attestation and co-signature Inter-disciplinary care team collaboration (see longitudinal plan of care) Comprehensive medication review performed; medication list updated in electronic medical record  Hypertension    BP goal is:  <130/80 Patient checks BP at home several times per month Patient home BP readings are ranging: <130/80   Patient has failed these meds in the past: n/a Patient is currently controlled on the following medications: Olmesartan 40 mg daily HCTZ 12.5 mg daily   We discussed: pt endorsed compliance and denies issues;  Plan: Continue current medications and control with diet and exercise    Hyperlipidemia    LDL goal 30% reduction (<130) Statin intolerant   Patient has failed these meds in past: Praluent, Nexlizet, Repatha ($$), rosuvastatin TIW, atorvastatin Patient is currently uncontrolled on the following medications: Ezetimibe 10 mg daily Aspirin 81 mg daily OTC Krill Oil   We discussed:  LDL improved from 177 to 126 while on ezetimibe (29% reduction); pt has not  tolerated statins or PCSK9 previously; pt endorses adherence and denies side effects   Plan: Continue control with diet and exercise    Diabetes    A1c goal <7% Checking BG: 2x daily Recent FBG Readings: 150, 129-78 afternoon   Patient has failed these meds in past: soliqua Patient is currently controlled on the following medications: Metformin 1000 mg BID Test strips   We discussed: Pt reports BG between 78-150 at home; he denies BG > 80 checking AM and PM. He reports he does better with diet/exercise in the summer when he has his garden to [employer]; he reports in the winter he is more sedentary and does not eat as many fresh vegetables -May consider Farxiga or Rybelsus in the future if further DM control is needed, pt would like to avoid injections  Plan: Recommend to continue current medication   Interstitial Lung Disease / Emphysema    Follows with Dr Halford Chessman Last spirometry score: 07/01/2018 -FEV1 119% predicted -FEV1/FVC 0.80 -FEV1 % change: 0%    Patient has failed these meds in past: n/a Patient is currently uncontrolled on the following medications: Stiolto Respimat 2 puffs daily (Dr Halford Chessman) O2 1.5 L w/ activity   Using maintenance inhaler regularly? Yes Frequency of rescue inhaler use:  never   We discussed:  Pt reports he has been taking Stiolto daily for the past week since he has a cold; he normally only takes it "every once in a while"; he reports he took it for a month straight once and symptoms were no better   Plan: Continue current medications   Vaccines  Reviewed and discussed patient's vaccination history.   Vaccine gaps: Shingrix #2, flu -Pt reports he had Shingrix  2nd dose on 07/05/21. He has not had flu shot and plans to get it 3-4 weeks after Shingrix  Plan: Flu shot 3-4 weeks after Shingrix  Patient Goals/Self-Care Activities Patient will:  - take medications as prescribed -focus on medication adherence by routine -check glucose 2 times  daily -engage in dietary modifications by increasing vegetables, reducing carbs -Get flu vaccine in October        Medication Assistance: None required.  Patient affirms current coverage meets needs.  Compliance/Adherence/Medication fill history: Care Gaps: Colonoscopy (due 02/14/17) Eye exam (05/19/21)  Star-Rating Drugs: Metformin - LF 05/18/21 90 ds Olmesartan - LF 05/18/21 x 90 ds  Patient's preferred pharmacy is:  [id number] [place], [place] - [address] [place] [address] Phone: [phone] Fax: [phone]  Uses pill box? Yes Pt endorses 100% compliance  We discussed: Current pharmacy is preferred with insurance plan and patient is satisfied with pharmacy services Patient decided to: Continue current medication management strategy  Care Plan and Follow Up Patient Decision:  Patient agrees to Care Plan and Follow-up.  Plan: Telephone follow up appointment with care management team member scheduled for:  6 months  Charlene Brooke, PharmD, [place], CPP Clinical Pharmacist [facility] [phone]

## 2021-07-15 DIAGNOSIS — I10 Essential (primary) hypertension: Secondary | ICD-10-CM | POA: Diagnosis not present

## 2021-07-15 DIAGNOSIS — E785 Hyperlipidemia, unspecified: Secondary | ICD-10-CM

## 2021-07-15 DIAGNOSIS — E118 Type 2 diabetes mellitus with unspecified complications: Secondary | ICD-10-CM | POA: Diagnosis not present

## 2021-07-20 DIAGNOSIS — E119 Type 2 diabetes mellitus without complications: Secondary | ICD-10-CM | POA: Diagnosis not present

## 2021-07-20 LAB — HM DIABETES EYE EXAM

## 2021-07-28 ENCOUNTER — Ambulatory Visit (INDEPENDENT_AMBULATORY_CARE_PROVIDER_SITE_OTHER): Payer: Medicare Other

## 2021-07-28 ENCOUNTER — Other Ambulatory Visit: Payer: Self-pay

## 2021-07-28 VITALS — BP 122/80 | HR 89 | Temp 98.1°F | Ht 67.0 in | Wt 219.6 lb

## 2021-07-28 DIAGNOSIS — Z0000 Encounter for general adult medical examination without abnormal findings: Secondary | ICD-10-CM | POA: Diagnosis not present

## 2021-07-28 DIAGNOSIS — Z23 Encounter for immunization: Secondary | ICD-10-CM

## 2021-07-28 NOTE — Patient Instructions (Signed)
[name] , Thank you for taking time to come for your Medicare Wellness Visit. I appreciate your ongoing commitment to your health goals. Please review the following plan we discussed and let me know if I can assist you in the future.   Screening recommendations/referrals: Colonoscopy: declined Recommended yearly ophthalmology/optometry visit for glaucoma screening and checkup Recommended yearly dental visit for hygiene and checkup  Vaccinations: Influenza vaccine: 07/28/2021 Pneumococcal vaccine: 06/25/2013, 08/08/2016 Tdap vaccine: 01/12/2016; due every 10 years Shingles vaccine: 04/28/2021, 07/05/2021   Covid-19: 12/16/2019, 01/11/2020, 03/27/2021  Advanced directives: Advance directive discussed with you today. Even though you declined this today please call our office should you change your mind and we can give you the proper paperwork for you to fill out.  Conditions/risks identified: Yes; Client understands the importance of follow-up with providers by attending scheduled visits and discussed goals to eat healthier, increase physical activity, exercise the brain, socialize more, get enough sleep and make time for laughter.  Next appointment: Please schedule your next Medicare Wellness Visit with your Nurse Health Advisor in 1 year by calling [phone].  Preventive Care 16 Years and Older, Male Preventive care refers to lifestyle choices and visits with your health care provider that can promote health and wellness. What does preventive care include? A yearly physical exam. This is also called an annual well check. Dental exams once or twice a year. Routine eye exams. Ask your health care provider how often you should have your eyes checked. Personal lifestyle choices, including: Daily care of your teeth and gums. Regular physical activity. Eating a healthy diet. Avoiding tobacco and drug use. Limiting alcohol use. Practicing safe sex. Taking low doses of aspirin every  day. Taking vitamin and mineral supplements as recommended by your health care provider. What happens during an annual well check? The services and screenings done by your health care provider during your annual well check will depend on your age, overall health, lifestyle risk factors, and family history of disease. Counseling  Your health care provider may ask you questions about your: Alcohol use. Tobacco use. Drug use. Emotional well-being. Home and relationship well-being. Sexual activity. Eating habits. History of falls. Memory and ability to understand (cognition). Work and work Astronomer. Screening  You may have the following tests or measurements: Height, weight, and BMI. Blood pressure. Lipid and cholesterol levels. These may be checked every 5 years, or more frequently if you are over 37 years old. Skin check. Lung cancer screening. You may have this screening every year starting at age 76 if you have a 30-pack-year history of smoking and currently smoke or have quit within the past 15 years. Fecal occult blood test (FOBT) of the stool. You may have this test every year starting at age 22. Flexible sigmoidoscopy or colonoscopy. You may have a sigmoidoscopy every 5 years or a colonoscopy every 10 years starting at age 81. Prostate cancer screening. Recommendations will vary depending on your family history and other risks. Hepatitis C blood test. Hepatitis B blood test. Sexually transmitted disease (STD) testing. Diabetes screening. This is done by checking your blood sugar (glucose) after you have not eaten for a while (fasting). You may have this done every 1-3 years. Abdominal aortic aneurysm (AAA) screening. You may need this if you are a current or former smoker. Osteoporosis. You may be screened starting at age 58 if you are at high risk. Talk with your health care provider about your test results, treatment options, and if necessary, the need for  more  tests. Vaccines  Your health care provider may recommend certain vaccines, such as: Influenza vaccine. This is recommended every year. Tetanus, diphtheria, and acellular pertussis (Tdap, Td) vaccine. You may need a Td booster every 10 years. Zoster vaccine. You may need this after age 90. Pneumococcal 13-valent conjugate (PCV13) vaccine. One dose is recommended after age 23. Pneumococcal polysaccharide (PPSV23) vaccine. One dose is recommended after age 74. Talk to your health care provider about which screenings and vaccines you need and how often you need them. This information is not intended to replace advice given to you by your health care provider. Make sure you discuss any questions you have with your health care provider. Document Released: 10/29/2015 Document Revised: 06/21/2016 Document Reviewed: 08/03/2015 Elsevier Interactive Patient Education  2017 [place] Prevention in the Home Falls can cause injuries. They can happen to people of all ages. There are many things you can do to make your home safe and to help prevent falls. What can I do on the outside of my home? Regularly fix the edges of walkways and driveways and fix any cracks. Remove anything that might make you trip as you walk through a door, such as a raised step or threshold. Trim any bushes or trees on the path to your home. Use bright outdoor lighting. Clear any walking paths of anything that might make someone trip, such as rocks or tools. Regularly check to see if handrails are loose or broken. Make sure that both sides of any steps have handrails. Any raised decks and porches should have guardrails on the edges. Have any leaves, snow, or ice cleared regularly. Use sand or salt on walking paths during winter. Clean up any spills in your garage right away. This includes oil or grease spills. What can I do in the bathroom? Use night lights. Install grab bars by the toilet and in the tub and shower.  Do not use towel bars as grab bars. Use non-skid mats or decals in the tub or shower. If you need to sit down in the shower, use a plastic, non-slip stool. Keep the floor dry. Clean up any water that spills on the floor as soon as it happens. Remove soap buildup in the tub or shower regularly. Attach bath mats securely with double-sided non-slip rug tape. Do not have throw rugs and other things on the floor that can make you trip. What can I do in the bedroom? Use night lights. Make sure that you have a light by your bed that is easy to reach. Do not use any sheets or blankets that are too big for your bed. They should not hang down onto the floor. Have a firm chair that has side arms. You can use this for support while you get dressed. Do not have throw rugs and other things on the floor that can make you trip. What can I do in the kitchen? Clean up any spills right away. Avoid walking on wet floors. Keep items that you use a lot in easy-to-reach places. If you need to reach something above you, use a strong step stool that has a grab bar. Keep electrical cords out of the way. Do not use floor polish or wax that makes floors slippery. If you must use wax, use non-skid floor wax. Do not have throw rugs and other things on the floor that can make you trip. What can I do with my stairs? Do not leave any items on the stairs. Make  sure that there are handrails on both sides of the stairs and use them. Fix handrails that are broken or loose. Make sure that handrails are as long as the stairways. Check any carpeting to make sure that it is firmly attached to the stairs. Fix any carpet that is loose or worn. Avoid having throw rugs at the top or bottom of the stairs. If you do have throw rugs, attach them to the floor with carpet tape. Make sure that you have a light switch at the top of the stairs and the bottom of the stairs. If you do not have them, ask someone to add them for you. What else  can I do to help prevent falls? Wear shoes that: Do not have high heels. Have rubber bottoms. Are comfortable and fit you well. Are closed at the toe. Do not wear sandals. If you use a stepladder: Make sure that it is fully opened. Do not climb a closed stepladder. Make sure that both sides of the stepladder are locked into place. Ask someone to hold it for you, if possible. Clearly mark and make sure that you can see: Any grab bars or handrails. First and last steps. Where the edge of each step is. Use tools that help you move around (mobility aids) if they are needed. These include: Canes. Walkers. Scooters. Crutches. Turn on the lights when you go into a dark area. Replace any light bulbs as soon as they burn out. Set up your furniture so you have a clear path. Avoid moving your furniture around. If any of your floors are uneven, fix them. If there are any pets around you, be aware of where they are. Review your medicines with your doctor. Some medicines can make you feel dizzy. This can increase your chance of falling. Ask your doctor what other things that you can do to help prevent falls. This information is not intended to replace advice given to you by your health care provider. Make sure you discuss any questions you have with your health care provider. Document Released: 07/29/2009 Document Revised: 03/09/2016 Document Reviewed: 11/06/2014 Elsevier Interactive Patient Education  2017 [employer].

## 2021-07-28 NOTE — Progress Notes (Signed)
Subjective:   [name] is a 65 y.o. male who presents for Medicare Annual/Subsequent preventive examination.  Review of Systems     Cardiac Risk Factors include: advanced age (>37mn, >>28women);diabetes mellitus;dyslipidemia;family history of premature cardiovascular disease;hypertension;male gender;obesity (BMI >30kg/m2)     Objective:    Today's Vitals   07/28/21 1130  BP: 122/80  Pulse: 89  Temp: 98.1 F (36.7 C)  SpO2: 96%  Weight: 219 lb 9.6 oz (99.6 kg)  Height: _0  (1.702 m)  PainSc: 0-No pain   Body mass index is 34.39 kg/m.  Advanced Directives 07/28/2021 11/27/2020 04/22/2020 12/26/2018 11/26/2018 12/12/2017 11/12/2016  Does Patient Have a Medical Advance Directive? _1  No Yes  Type of Advance Directive - - - - - - HPress photographerLiving will  Does patient want to make changes to medical advance directive? - - - - - - -  Copy of [place] Chart? - - - - - - Yes  Would patient like information on creating a medical advance directive? No - Patient declined No - Patient declined No - Patient declined Yes (ED - Information included in AVS) No - Patient declined Yes (ED - Information included in AVS) -    Current Medications (verified) Outpatient Encounter Medications as of 07/28/2021  Medication Sig   aspirin 81 MG EC tablet Take 81 mg by mouth daily.   Blood Glucose Monitoring Suppl (ONETOUCH VERIO) w/Device KIT 1 Act by Does not apply route in the morning and at bedtime.   [employer] D3 50 MCG (2000 UT) CAPS TAKE 2 CAPSULES (4,000 UNITS TOTAL) BY MOUTH DAILY.   ezetimibe (ZETIA) 10 MG tablet Take 1 tablet (10 mg total) by mouth daily.   famotidine (PEPCID) 40 MG tablet TAKE 1 TABLET BY MOUTH EVERY DAY   fexofenadine (ALLEGRA) 180 MG tablet Take 180 mg by mouth daily as needed for allergies.   glucose blood (ONETOUCH VERIO) test strip 1 each by Other route in the morning and at bedtime. Use as instructed    hydrochlorothiazide (MICROZIDE) 12.5 MG capsule TAKE 1 CAPSULE BY MOUTH EVERY DAY   Insulin Pen Needle 32G X 6 MM MISC 1 Act by Does not apply route daily.   KLOR-CON M20 20 MEQ tablet TAKE 1 TABLET BY MOUTH TWICE A DAY   Lancets (ONETOUCH ULTRASOFT) lancets    metFORMIN (GLUCOPHAGE) 1000 MG tablet TAKE 1 TABLET (1,000 MG TOTAL) BY MOUTH 2 (TWO) TIMES DAILY WITH A MEAL.   NON FORMULARY Oxygen continuous 1.5 liters with activity   olmesartan (BENICAR) 40 MG tablet TAKE 1 TABLET BY MOUTH EVERY DAY   ONETOUCH VERIO test strip USE 1 PER DAY   Tiotropium Bromide-Olodaterol (STIOLTO RESPIMAT) 2.5-2.5 MCG/ACT AERS Inhale 2 puffs into the lungs daily.   No facility-administered encounter medications on file as of 07/28/2021.    Allergies (verified) Latex, Morphine, and Statins   History: Past Medical History:  Diagnosis Date   COPD (chronic obstructive pulmonary disease) [place] 09[place]23[place]   -PFT 09/23/08 FEV1 2.89(84%), FVC 3.70 (78%), TLC 5.28 (78%), DLCO 64% mixed obstructive and  restrictive pattern.  Spirometry 03/11/09 FEV 3.35 (97%),FVC 4.10 (86%), FEV% 82. Steroid responsive pneumonitis 04/09- required mechanical ventilation during hospitalization - The following hospital test were negative: PCP,ANA,HIV,BAL. - ACE25,RF20,CRP 28,A1AT 263, ESR 109   Depression    Diabetes mellitus without complication (HCC)    Dyslipidemia    Dyslipidemia    GERD (gastroesophageal reflux disease)  History of skin cancer    Hypertension    Echo 10/09 EF 60%   ILD (interstitial lung disease) ([place])    Panic attack    Past Surgical History:  Procedure Laterality Date   BRONCHOSCOPY  10/09   Ganglion cyst on foot     HERNIA REPAIR     2 hernia   Left shoulder surgery     Family History  Problem Relation Age of Onset   Heart attack Father 13   Diabetes Father    Heart attack Brother    Emphysema Brother    Diabetes Brother    Social History   Socioeconomic History   Marital status: Divorced     Spouse name: Not on file   Number of children: 0   Years of education: 10th grade returned for GED   Highest education level: GED or equivalent  Occupational History   Occupation: Scientist, water quality: UNEMPLOYED    Comment: [place]  Tobacco Use   Smoking status: Former    Packs/day: 1.00    Years: 16.00    Pack years: 16.00    Types: Cigarettes    Quit date: 10/17/2003    Years since quitting: 17.7   Smokeless tobacco: Never  Vaping Use   Vaping Use: Never used  Substance and Sexual Activity   Alcohol use: No    Comment: Rare beer 3-4 times per year   Drug use: No   Sexual activity: Not Currently  Other Topics Concern   Not on file  Social History Narrative   Divorced. No Children   Drug use-No   Regular exercise-yes   Lives alone   GED obtained after completed 10th grade    As of 01/28/21 Has still 3 brothers and 2 sisters living had 8 siblings and loss 4 one recent sister loss in April 2022   Social Determinants of Health   Financial Resource Strain: Low Risk    Difficulty of Paying Living Expenses: Not hard at all  Food Insecurity: No Food Insecurity   Worried About Charity fundraiser in the Last Year: Never true   Arboriculturist in the Last Year: Never true  Transportation Needs: No Transportation Needs   Lack of Transportation (Medical): No   Lack of Transportation (Non-Medical): No  Physical Activity: Not on file  Stress: No Stress Concern Present   Feeling of Stress : Not at all  Social Connections: Socially Isolated   Frequency of Communication with Friends and Family: More than three times a week   Frequency of Social Gatherings with Friends and Family: Once a week   Attends Religious Services: Never   Marine scientist or Organizations: No   Attends Music therapist: Never   Marital Status: Divorced    Tobacco Counseling Counseling given: Not Answered   Clinical Intake:  Pre-visit preparation completed:  Yes  Pain : No/denies pain Pain Score: 0-No pain     BMI - recorded: 34.39 Nutritional Status: BMI > 30  Obese Nutritional Risks: None Diabetes: Yes CBG done?: Yes (144) CBG resulted in Enter/ Edit results?: Yes Did pt. bring in CBG monitor from home?: No  How often do you need to have someone help you when you read instructions, pamphlets, or other written materials from your doctor or pharmacy?: 1 - Never What is the last grade level you completed in school?: High School Graduate  Diabetic? yes  Interpreter Needed?: No  Information entered by ::  Lisette Abu, LPN   Activities of Daily Living In your present state of health, do you have any difficulty performing the following activities: 07/28/2021 04/25/2021  Hearing? N N  Vision? N N  Difficulty concentrating or making decisions? N N  Walking or climbing stairs? N N  Dressing or bathing? N N  Doing errands, shopping? N N  Preparing Food and eating ? N -  Using the Toilet? N -  In the past six months, have you accidently leaked urine? N -  Do you have problems with loss of bowel control? N -  Managing your Medications? N -  Managing your Finances? N -  Housekeeping or managing your Housekeeping? N -  Some recent data might be hidden    Patient Care Team: [name], MD as PCP - General Chesley Mires, MD as Consulting Physician (Pulmonary Disease) End, Harrell Gave, MD as Consulting Physician (Cardiology) Charlton Haws, [facility] as Pharmacist (Pharmacist) Barbaraann Faster, RN as [place] any recent Medical Services you may have received from other than [facility] providers in the past year (date may be approximate).     Assessment:   This is a routine wellness examination for [name].  Hearing/Vision screen Hearing Screening - Comments:: Patient denied any hearing difficulty.   No hearing aids.  Vision Screening - Comments:: Patient wears corrective  glasses/contacts.  Eye exam done annually by: [place] ([facility] [place])  Dietary issues and exercise activities discussed: Current Exercise Habits: Home exercise routine, Type of exercise: walking, Time (Minutes): 30, Frequency (Times/Week): 5, Weekly Exercise (Minutes/Week): 150, Intensity: Mild, Exercise limited by: respiratory conditions(s)   Goals Addressed               This Visit's Progress     Patient Stated (pt-stated)        My goal is to maintain my weight and glucose.      Depression Screen PHQ 2/9 Scores 07/28/2021 04/25/2021 01/28/2021 11/26/2020 04/22/2020 12/26/2018 12/12/2017  PHQ - 2 Score 0 1 0 0 0 3 2  PHQ- 9 Score - - - - - 7 6    Fall Risk Fall Risk  07/28/2021 04/25/2021 04/22/2020 12/26/2018 12/14/2017  Falls in the past year? 0 1 0 0 No  Number falls in past yr: 0 0 0 0 -  Injury with Fall? 0 0 0 - -  Risk for fall due to : No Fall Risks - No Fall Risks Impaired balance/gait -  Follow up Falls evaluation completed - Falls evaluation completed;Education provided Falls prevention discussed -    FALL RISK PREVENTION PERTAINING TO THE HOME:  Any stairs in or around the home? No  If so, are there any without handrails? No  Home free of loose throw rugs in walkways, pet beds, electrical cords, etc? Yes  Adequate lighting in your home to reduce risk of falls? Yes   ASSISTIVE DEVICES UTILIZED TO PREVENT FALLS:  Life alert? No  Use of a cane, walker or w/c? No  Grab bars in the bathroom? No  Shower chair or bench in shower? No  Elevated toilet seat or a handicapped toilet? No   TIMED UP AND GO:  Was the test performed? Yes .  Length of time to ambulate 10 feet: 7 sec.   Gait steady and fast without use of assistive device  Cognitive Function: Normal cognitive status assessed by direct observation by this Nurse Health Advisor. No abnormalities found.  6CIT Screen 04/22/2020  What Year? 0 points  What month? 0 points  What time? 0 points  Count back  from 20 0 points  Months in reverse 0 points  Repeat phrase 0 points  Total Score 0    Immunizations Immunization History  Administered Date(s) Administered   Fluad Quad(high Dose 65+) 06/24/2019   Influenza Split 07/05/2011, 07/16/2012   Influenza Whole 07/21/2009, 08/22/2010   Influenza,inj,Quad PF,6+ Mos 06/25/2013, 07/13/2014, 07/15/2015, 08/08/2016, 06/11/2017, 06/12/2018   Influenza-Unspecified 07/16/2020   Moderna Sars-Covid-2 Vaccination 12/16/2019, 01/11/2020, 03/27/2021   Pneumococcal Conjugate-13 06/25/2013   Pneumococcal Polysaccharide-23 08/17/2008, 08/08/2016   Td 10/16/2005   Tdap 01/12/2016   Zoster Recombinat (Shingrix) 04/28/2021, 07/05/2021   Zoster, Live 05/08/2016    TDAP status: Up to date  Flu Vaccine status: Completed at today's visit  Pneumococcal vaccine status: Up to date  Covid-19 vaccine status: Completed vaccines  Qualifies for Shingles Vaccine? Yes   Zostavax completed Yes   Shingrix Completed?: Yes  Screening Tests Health Maintenance  Topic Date Due   COLONOSCOPY (Pts 45-38yr Insurance coverage will need to be confirmed)  02/14/2017   INFLUENZA VACCINE  05/16/2021   OPHTHALMOLOGY EXAM  05/19/2021   COVID-19 Vaccine (4 - Booster for Moderna series) 07/27/2021   HEMOGLOBIN A1C  10/08/2021   FOOT EXAM  10/26/2021   TETANUS/TDAP  01/11/2026   Hepatitis C Screening  Completed   Zoster Vaccines- Shingrix  Completed   HPV VACCINES  Aged Out    Health Maintenance  Health Maintenance Due  Topic Date Due   COLONOSCOPY (Pts 45-469yrInsurance coverage will need to be confirmed)  02/14/2017   INFLUENZA VACCINE  05/16/2021   OPHTHALMOLOGY EXAM  05/19/2021   COVID-19 Vaccine (4 - Booster for Moderna series) 07/27/2021    Colorectal cancer screening: No longer required.  Patient declined  Lung Cancer Screening: (Low Dose CT Chest recommended if Age 67-80ears, 30 pack-year currently smoking OR have quit w/in 15years.) does not qualify.    Lung Cancer Screening Referral: no   Additional Screening:  Hepatitis C Screening: does qualify; Completed yes  Vision Screening: Recommended annual ophthalmology exams for early detection of glaucoma and other disorders of the eye. Is the patient up to date with their annual eye exam?  Yes  Who is the provider or what is the name of the office in which the patient attends annual eye exams? [facility] is not established with a provider, would they like to be referred to a provider to establish care? No .   Dental Screening: Recommended annual dental exams for proper oral hygiene  Community Resource Referral / Chronic Care Management: CRR required this visit?  No   CCM required this visit?  No      Plan:     I have personally reviewed and noted the following in the patient's chart:   Medical and social history Use of alcohol, tobacco or illicit drugs  Current medications and supplements including opioid prescriptions. Patient is not currently taking opioid prescriptions. Functional ability and status Nutritional status Physical activity Advanced directives List of other physicians Hospitalizations, surgeries, and ER visits in previous 12 months Vitals Screenings to include cognitive, depression, and falls Referrals and appointments  In addition, I have reviewed and discussed with patient certain preventive protocols, quality metrics, and best practice recommendations. A written personalized care plan for preventive services as well as general preventive health recommendations were provided to patient.     ShSheral Flow  LPN   [id number]   Nurse Notes:  Hearing Screening - Comments:: Patient denied any hearing difficulty.   No hearing aids.  Vision Screening - Comments:: Patient wears corrective glasses/contacts.  Eye exam done annually by: [place] ([facility], [place])

## 2021-08-16 ENCOUNTER — Other Ambulatory Visit: Payer: Self-pay | Admitting: Internal Medicine

## 2021-08-16 DIAGNOSIS — I10 Essential (primary) hypertension: Secondary | ICD-10-CM

## 2021-08-16 DIAGNOSIS — E876 Hypokalemia: Secondary | ICD-10-CM

## 2021-08-16 DIAGNOSIS — E785 Hyperlipidemia, unspecified: Secondary | ICD-10-CM

## 2021-08-16 DIAGNOSIS — K2100 Gastro-esophageal reflux disease with esophagitis, without bleeding: Secondary | ICD-10-CM

## 2021-08-23 ENCOUNTER — Other Ambulatory Visit: Payer: Self-pay | Admitting: *Deleted

## 2021-08-23 ENCOUNTER — Encounter: Payer: Self-pay | Admitting: *Deleted

## 2021-08-23 NOTE — Patient Outreach (Signed)
[facility]) Care Management  08/23/2021  [name] 06-Apr-1956 [id number]   [facility] Unsuccessful outreach  [name] was referred to [facility] on 10/27/20 via MD referral  Referral Reason: poorly controlled DM 2  Insurance:  [place] [employer] care [facility])      Since 10/27/20 [name] had an improvement in his HgA1c Last hemoglobin A1c Lab Results  Component Value Date   HGBA1C 7.2 (H) 04/08/2021  He had a previous value of 8.4 on 10/26/20    Last successful outreach on 06/21/21   Outreach attempt to the listed at the preferred outreach number in EPIC [phone] No answer. [facility] RN CM left HIPAA [facility] Portability and Accountability Act) compliant voicemail message along with CM's contact info.   Plan: [facility] [place] RN CM scheduled this patient for another call attempt within 4-7 business days Unsuccessful outreach on 08/23/21    Shaarav Ripple L. Noelle Penner, RN, BSN, CCM [facility] Telephonic Care Management Care Coordinator Office number [phone] Mobile number [phone]  Main [facility] number [phone] Fax number [phone]

## 2021-08-23 NOTE — Patient Outreach (Addendum)
[facility]) Care Management  08/23/2021  [name] 1955/10/26 [id number]   [facility] return call from patient  [name] was referred to [facility] on 10/27/20 via MD referral  Referral Reason: poorly controlled DM 2  Insurance:  [place] [employer] care [facility])           Since 10/27/20 Eric Schupp had an improvement in his HgA1c He had a previous value of 8.4 on 10/26/20    Care Plan : RN Care Manager Plan of Care  Updates made by Clinton Gallant, RN since 08/23/2021 12:00 AM     Problem: Complex Care Coordination Needs and disease management in patient with Diabetes, emphysema   Priority: High     Long-Range Goal: Establish Plan of Care for Management Complex SDOH Barriers, disease management and Care Coordination Needs in patient with Diabetes, emphysema   Start Date: 08/23/2021  Priority: High  Note:   Current Barriers:  Knowledge Deficits related to plan of care for management of COPD and DMII Care Coordination needs related to Limited education about DMII and COPD*  RN CM Clinical Goal(s):  Patient will verbalize understanding of plan for management of COPD and DMII demonstrate Improved adherence to prescribed treatment plan for COPD and DMII as evidenced by improved cbg and HgA1c values  through collaboration with RN Care manager, provider, and care team.   Interventions: Provide education for major medical conditions Discussed pcp office RN CM availability with pending warm transfer Inter-disciplinary care team collaboration (see longitudinal plan of care) Evaluation of current treatment plan related to  self management and patient's adherence to plan as established by provider   Diabetes Interventions: Assessed patient's understanding of A1c goal: <7% Reviewed medications with patient and discussed importance of medication adherence; Counseled on importance of regular laboratory monitoring as prescribed; Discussed plans with patient for ongoing care  management follow up and provided patient with direct contact information for care management team; Provided patient with written educational materials related to hypo and hyperglycemia and importance of correct treatment; Discussed monitoring for triggers (eating out, fries, pies, etc) Reviewed his concern with less winter vegetables than summer vegetables. Discussed some winter vegetables. He voices liking cabbage, broccoli, turnip and cauliflower he will try Lab Results  Component Value Date   HGBA1C 7.2 (H) 04/08/2021   COPD Interventions:   Advised patient to track and manage COPD triggers;  Encourage use of oxygen  Patient Goals/Self-Care Activities: Patient will self administer medications as prescribed Patient will attend all scheduled provider appointments Patient will call pharmacy for medication refills Patient will continue to perform ADL's independently Patient will continue to perform IADL's independently Patient will call provider office for new concerns or questions Continue covid pandemic precautions (use of mask, handwashing, distancing)  Follow Up Plan:  The patient has been provided with contact information for the care management team and has been advised to call with any health related questions or concerns.  The care management team will reach out to the patient again over the next 60 business days.      Patient Active Problem List   Diagnosis Date Noted   Statin myopathy 04/25/2021   Encounter for general adult medical examination with abnormal findings 10/26/2020   Vitamin D deficiency disease 06/12/2018   GERD with esophagitis 12/12/2017   Thoracic aortic aneurysm without rupture 12/12/2017   Screen for colon cancer 01/12/2016   Routine general medical examination at a health care facility 07/16/2015   Benign prostatic hyperplasia 07/13/2014  Type II diabetes mellitus with manifestations (HCC) 11/12/2013   Pure hyperglyceridemia 07/09/2013   Upper  airway cough syndrome 02/06/2012   Hyperlipidemia with target LDL less than 130 07/05/2011   EMPHYSEMA 12/07/2008   Essential hypertension, benign 08/26/2008   INTERSTITIAL LUNG DISEASE 08/26/2008   Respiratory failure with hypoxia (HCC) 08/26/2008     Goals Addressed               This Visit's Progress     Patient Stated     [facility]) Monitor and Manage My Blood Sugar-Diabetes Type 2 (pt-stated)   On track     Timeframe:  Long-Range Goal Priority:  Medium Start Date:                            11/10/20 Expected End Date:   09/14/21                    Resolving due to duplicate goal 08/23/21 Barriers: Knowledge   - check blood sugar at prescribed times - take the blood sugar meter to all doctor visits     Notes:   08/23/21 Resolving due to duplicate goal -He reports he has been eating too much at night cbg 125-159 He ate out x 2 with fries x 2 causing elevations with some food exchanges. He reports an inhaler "donut hole" cost of $367 but reports he worked with his local pharmacy to get month supply vs a 3 month supply. He has enough medicines to last until January 2023. He has made contact with his pcp office pharmacy staff related to hyperglycemia episodes and insulin intake 06/21/21 He reports better management with generally a cbg value of 159 and as low as 90-100 in the evenings. He reports his last Hg A1C was  6.2. He reports 1 episode of hypoglycemia with a cbg value of 70 This was resolved with intake of Orange Juice and candy intake  05/09/21 cbg 104-150 still checking 3-4 times a day Stopped taking insulin, largest meal is dinner He states he has trying better to read food labels, manage using serving size, eating more of his vegetables as he does during the summer time HgA1c with improvement and down to 7/2 from 7.5 02/17/21 continues to monitor able to review 7, 14 and 30 day values used to calculate possible Hga1c of 7.5 pending next office lab in July 2022 Last HgA1c of 8.4 on  10/26/20 Discussed a change of activity and increase vegetables in the spring/summer improves his HgA1c as noted per review of HgA1c trends with patient 01/28/21 still continue to monitor at home Noting some variations 98-191 12/21/20 confirms has new glucose monitor, monitors 2-3 times a day, keeping a log and will take meter to MD office           Cala Bradford L. Noelle Penner, RN, BSN, CCM [facility] Telephonic Care Management Care Coordinator Office number [phone] Main [facility] number [phone] Fax number [phone]

## 2021-08-31 ENCOUNTER — Ambulatory Visit: Payer: Self-pay | Admitting: *Deleted

## 2021-09-21 DIAGNOSIS — Z1211 Encounter for screening for malignant neoplasm of colon: Secondary | ICD-10-CM | POA: Diagnosis not present

## 2021-10-26 ENCOUNTER — Encounter: Payer: Self-pay | Admitting: Internal Medicine

## 2021-10-26 ENCOUNTER — Other Ambulatory Visit: Payer: Self-pay

## 2021-10-26 ENCOUNTER — Ambulatory Visit (INDEPENDENT_AMBULATORY_CARE_PROVIDER_SITE_OTHER): Payer: Medicare Other | Admitting: Internal Medicine

## 2021-10-26 VITALS — BP 118/76 | HR 98 | Temp 98.1°F | Resp 16 | Ht 67.0 in | Wt 220.0 lb

## 2021-10-26 DIAGNOSIS — E559 Vitamin D deficiency, unspecified: Secondary | ICD-10-CM

## 2021-10-26 DIAGNOSIS — E781 Pure hyperglyceridemia: Secondary | ICD-10-CM

## 2021-10-26 DIAGNOSIS — T466X5A Adverse effect of antihyperlipidemic and antiarteriosclerotic drugs, initial encounter: Secondary | ICD-10-CM

## 2021-10-26 DIAGNOSIS — J9611 Chronic respiratory failure with hypoxia: Secondary | ICD-10-CM | POA: Diagnosis not present

## 2021-10-26 DIAGNOSIS — G720 Drug-induced myopathy: Secondary | ICD-10-CM

## 2021-10-26 DIAGNOSIS — Z0001 Encounter for general adult medical examination with abnormal findings: Secondary | ICD-10-CM

## 2021-10-26 DIAGNOSIS — E118 Type 2 diabetes mellitus with unspecified complications: Secondary | ICD-10-CM

## 2021-10-26 DIAGNOSIS — N3943 Post-void dribbling: Secondary | ICD-10-CM

## 2021-10-26 DIAGNOSIS — I10 Essential (primary) hypertension: Secondary | ICD-10-CM | POA: Diagnosis not present

## 2021-10-26 DIAGNOSIS — E785 Hyperlipidemia, unspecified: Secondary | ICD-10-CM | POA: Diagnosis not present

## 2021-10-26 DIAGNOSIS — Z1211 Encounter for screening for malignant neoplasm of colon: Secondary | ICD-10-CM

## 2021-10-26 DIAGNOSIS — N401 Enlarged prostate with lower urinary tract symptoms: Secondary | ICD-10-CM | POA: Diagnosis not present

## 2021-10-26 LAB — BASIC METABOLIC PANEL
BUN: 17 mg/dL (ref 6–23)
CO2: 25 mEq/L (ref 19–32)
Calcium: 10 mg/dL (ref 8.4–10.5)
Chloride: 101 mEq/L (ref 96–112)
Creatinine, Ser: 1.02 mg/dL (ref 0.40–1.50)
GFR: 75.87 mL/min (ref >60.00)
Glucose, Bld: 164 mg/dL — ABNORMAL HIGH (ref 70–99)
Potassium: 4.5 mEq/L (ref 3.5–5.1)
Sodium: 134 mEq/L — ABNORMAL LOW (ref 135–145)

## 2021-10-26 LAB — LIPID PANEL
Cholesterol: 205 mg/dL — ABNORMAL HIGH (ref 0–200)
HDL: 41.5 mg/dL (ref >39.00)
NonHDL: 163.28
Total CHOL/HDL Ratio: 5
Triglycerides: 248 mg/dL — ABNORMAL HIGH (ref 0.0–149.0)
VLDL: 49.6 mg/dL — ABNORMAL HIGH (ref 0.0–40.0)

## 2021-10-26 LAB — URINALYSIS, ROUTINE W REFLEX MICROSCOPIC
Bilirubin Urine: NEGATIVE
Hgb urine dipstick: NEGATIVE
Ketones, ur: NEGATIVE
Leukocytes,Ua: NEGATIVE
Nitrite: NEGATIVE
RBC / HPF: NONE SEEN (ref 0–?)
Specific Gravity, Urine: 1.015 (ref 1.000–1.030)
Total Protein, Urine: NEGATIVE
Urine Glucose: NEGATIVE
Urobilinogen, UA: 0.2 (ref 0.0–1.0)
WBC, UA: NONE SEEN (ref 0–?)
pH: 6 (ref 5.0–8.0)

## 2021-10-26 LAB — CBC WITH DIFFERENTIAL/PLATELET
Basophils Absolute: 0.2 10*3/uL — ABNORMAL HIGH (ref 0.0–0.1)
Basophils Relative: 1.9 % (ref 0.0–3.0)
Eosinophils Absolute: 0.4 10*3/uL (ref 0.0–0.7)
Eosinophils Relative: 5.1 % — ABNORMAL HIGH (ref 0.0–5.0)
HCT: 41.6 % (ref 39.0–52.0)
Hemoglobin: 14.2 g/dL (ref 13.0–17.0)
Lymphocytes Relative: 25.5 % (ref 12.0–46.0)
Lymphs Abs: 2.2 10*3/uL (ref 0.7–4.0)
MCHC: 34 g/dL (ref 30.0–36.0)
MCV: 89.2 fl (ref 78.0–100.0)
Monocytes Absolute: 1.1 10*3/uL — ABNORMAL HIGH (ref 0.1–1.0)
Monocytes Relative: 12.6 % — ABNORMAL HIGH (ref 3.0–12.0)
Neutro Abs: 4.7 10*3/uL (ref 1.4–7.7)
Neutrophils Relative %: 54.9 % (ref 43.0–77.0)
Platelets: 244 10*3/uL (ref 150.0–400.0)
RBC: 4.67 Mil/uL (ref 4.22–5.81)
RDW: 13.7 % (ref 11.5–15.5)
WBC: 8.6 10*3/uL (ref 4.0–10.5)

## 2021-10-26 LAB — HEMOGLOBIN A1C: Hgb A1c MFr Bld: 7.6 % — ABNORMAL HIGH (ref 4.6–6.5)

## 2021-10-26 LAB — HEPATIC FUNCTION PANEL
ALT: 38 U/L (ref 0–53)
AST: 23 U/L (ref 0–37)
Albumin: 4.4 g/dL (ref 3.5–5.2)
Alkaline Phosphatase: 87 U/L (ref 39–117)
Bilirubin, Direct: 0.1 mg/dL (ref 0.0–0.3)
Total Bilirubin: 0.4 mg/dL (ref 0.2–1.2)
Total Protein: 6.5 g/dL (ref 6.0–8.3)

## 2021-10-26 LAB — LDL CHOLESTEROL, DIRECT: Direct LDL: 138 mg/dL

## 2021-10-26 LAB — PSA: PSA: 0.58 ng/mL (ref 0.10–4.00)

## 2021-10-26 LAB — TSH: TSH: 3.24 u[IU]/mL (ref 0.35–5.50)

## 2021-10-26 LAB — MICROALBUMIN / CREATININE URINE RATIO
Creatinine,U: 48.5 mg/dL
Microalb Creat Ratio: 1.4 mg/g (ref 0.0–30.0)
Microalb, Ur: <0.7 mg/dL (ref 0.0–1.9)

## 2021-10-26 NOTE — Progress Notes (Signed)
Subjective:  Patient ID: [name], male    DOB: [birth year]  Age: 66 y.o. MRN: [id number]  CC: Annual Exam, Diabetes, Hyperlipidemia, and Hypertension  This visit occurred during the SARS-CoV-2 public health emergency.  Safety protocols were in place, including screening questions prior to the visit, additional usage of staff PPE, and extensive cleaning of exam room while observing appropriate contact time as indicated for disinfecting solutions.    HPI [name] presents for a CPX and f/up -  He has baseline shortness of breath but denies chest pain, diaphoresis, dyspnea on exertion, edema, or fatigue.  He has had a few episodes of lightheadedness.  He complains that his appetite is good and he cannot lose weight.  Outpatient Medications Prior to Visit  Medication Sig Dispense Refill   aspirin 81 MG EC tablet Take 81 mg by mouth daily.     Blood Glucose Monitoring Suppl (ONETOUCH VERIO) w/Device KIT 1 Act by Does not apply route in the morning and at bedtime. 1 kit 2   CVS D3 50 MCG (2000 UT) CAPS TAKE 2 CAPSULES (4,000 UNITS TOTAL) BY MOUTH DAILY. 180 capsule 1   ezetimibe (ZETIA) 10 MG tablet TAKE 1 TABLET BY MOUTH EVERY DAY 90 tablet 3   famotidine (PEPCID) 40 MG tablet TAKE 1 TABLET BY MOUTH EVERY DAY 90 tablet 1   fexofenadine (ALLEGRA) 180 MG tablet Take 180 mg by mouth daily as needed for allergies.     glucose blood (ONETOUCH VERIO) test strip 1 each by Other route in the morning and at bedtime. Use as instructed 200 each 3   Insulin Pen Needle 32G X 6 MM MISC 1 Act by Does not apply route daily. 100 each 1   KLOR-CON M20 20 MEQ tablet TAKE 1 TABLET BY MOUTH TWICE A DAY 180 tablet 0   Lancets (ONETOUCH ULTRASOFT) lancets      metFORMIN (GLUCOPHAGE) 1000 MG tablet TAKE 1 TABLET (1,000 MG TOTAL) BY MOUTH 2 (TWO) TIMES DAILY WITH A MEAL. 180 tablet 1   NON FORMULARY Oxygen continuous 1.5 liters with activity     olmesartan (BENICAR) 40 MG tablet TAKE 1 TABLET BY MOUTH EVERY  DAY 90 tablet 1   ONETOUCH VERIO test strip USE 1 PER DAY 100 each 2   Tiotropium Bromide-Olodaterol (STIOLTO RESPIMAT) 2.5-2.5 MCG/ACT AERS Inhale 2 puffs into the lungs daily. 12 g 1   hydrochlorothiazide (MICROZIDE) 12.5 MG capsule TAKE 1 CAPSULE BY MOUTH EVERY DAY 90 capsule 1   MODERNA COVID-19 VACCINE 100 MCG/0.5ML injection      No facility-administered medications prior to visit.    ROS Review of Systems  Constitutional:  Negative for chills, diaphoresis, fatigue and fever.  HENT: Negative.    Eyes: Negative.   Respiratory:  Positive for shortness of breath. Negative for cough, chest tightness and wheezing.   Cardiovascular:  Negative for chest pain, palpitations and leg swelling.  Gastrointestinal:  Negative for abdominal pain, blood in stool, constipation, diarrhea, nausea and vomiting.  Endocrine: Negative.   Genitourinary: Negative.  Negative for difficulty urinating, dysuria, scrotal swelling and testicular pain.  Musculoskeletal:  Negative for arthralgias, joint swelling and myalgias.  Skin:  Negative for color change.  Neurological:  Positive for light-headedness. Negative for dizziness, speech difficulty and weakness.  Hematological:  Negative for adenopathy. Does not bruise/bleed easily.  Psychiatric/Behavioral: Negative.     Objective:  BP 118/76 (BP Location: Right Arm, Patient Position: Sitting, Cuff Size: Large)    Pulse 98  Temp 98.1 F (36.7 C) (Oral)    Resp 16    Ht $R'5\' 7"'wR$  (1.702 m)    Wt 220 lb (99.8 kg)    SpO2 95%    BMI 34.46 kg/m   BP Readings from Last 3 Encounters:  10/26/21 118/76  07/28/21 122/80  04/25/21 120/86    Wt Readings from Last 3 Encounters:  10/26/21 220 lb (99.8 kg)  07/28/21 219 lb 9.6 oz (99.6 kg)  04/25/21 219 lb 9.6 oz (99.6 kg)    Physical Exam Vitals reviewed.  Constitutional:      General: He is not in acute distress.    Appearance: He is ill-appearing (continuous O2). He is not toxic-appearing or diaphoretic.  HENT:      Nose: Nose normal.     Mouth/Throat:     Mouth: Mucous membranes are moist.  Eyes:     General: No scleral icterus.    Conjunctiva/sclera: Conjunctivae normal.  Cardiovascular:     Rate and Rhythm: Normal rate and regular rhythm.     Heart sounds: Normal heart sounds, S1 normal and S2 normal. No murmur heard.   No gallop.     Comments: EKG- NSR, 82 bpm No LVH or Q waves Normal EKG Pulmonary:     Effort: Pulmonary effort is normal.     Breath sounds: No stridor. No wheezing, rhonchi or rales.  Abdominal:     General: Abdomen is protuberant. Bowel sounds are normal. There is no distension.     Palpations: Abdomen is soft. There is no hepatomegaly, splenomegaly or mass.     Tenderness: There is no abdominal tenderness.     Hernia: There is no hernia in the left inguinal area or right inguinal area.  Genitourinary:    Pubic Area: No rash.      Penis: Normal.      Testes: Normal.     Epididymis:     Right: Normal.     Left: Normal.     Prostate: Enlarged. Not tender and no nodules present.     Rectum: Normal. Guaiac result negative. No mass, tenderness, anal fissure, external hemorrhoid or internal hemorrhoid. Normal anal tone.  Musculoskeletal:     Cervical back: Neck supple.     Right lower leg: No edema.     Left lower leg: No edema.  Lymphadenopathy:     Cervical: No cervical adenopathy.     Lower Body: No right inguinal adenopathy. No left inguinal adenopathy.  Neurological:     Mental Status: He is alert.    Lab Results  Component Value Date   WBC 8.6 10/26/2021   HGB 14.2 10/26/2021   HCT 41.6 10/26/2021   PLT 244.0 10/26/2021   GLUCOSE 164 (H) 10/26/2021   CHOL 205 (H) 10/26/2021   TRIG 248.0 (H) 10/26/2021   HDL 41.50 10/26/2021   LDLDIRECT 138.0 10/26/2021   LDLCALC 35 01/03/2017   ALT 38 10/26/2021   AST 23 10/26/2021   NA 134 (L) 10/26/2021   K 4.5 10/26/2021   CL 101 10/26/2021   CREATININE 1.02 10/26/2021   BUN 17 10/26/2021   CO2 25  10/26/2021   TSH 3.24 10/26/2021   PSA 0.58 10/26/2021   INR 1.2 08/04/2008   HGBA1C 7.6 (H) 10/26/2021   MICROALBUR <0.7 10/26/2021    CT ANGIO CHEST AORTA W/CM & OR WO/CM  Result Date: 04/15/2021 CLINICAL DATA:  Interstitial pneumonitis, thoracic aortic aneurysm EXAM: CT ANGIOGRAPHY CHEST WITH CONTRAST TECHNIQUE: Multidetector CT imaging of the chest  was performed using the standard protocol during bolus administration of intravenous contrast. Multiplanar CT image reconstructions and MIPs were obtained to evaluate the vascular anatomy. CONTRAST:  OMNIPAQUE IOHEXOL 350 MG/ML SOLN COMPARISON:  04/15/2020 FINDINGS: Cardiovascular: Similar atherosclerosis of the thoracic aorta. Negative for dissection. Mild fusiform aneurysmal dilatation of the ascending thoracic aorta, maximal diameter 4 cm, previously 3.6 cm. Patent 3 vessel arch anatomy. Central pulmonary arteries are normal in caliber and patent. No significant filling defect or pulmonary embolus. Normal heart size.  No pericardial effusion. Central venous structures appear patent.  No veno-occlusive process. Mediastinum/Nodes: Thyroid unremarkable. Trachea and central airways are patent. Esophagus nondilated. No hiatal hernia. Similar scattered small nonenlarged mediastinal and hilar lymph nodes. No bulky adenopathy. Lungs/Pleura: Similar pattern of mixed paraseptal and centrilobular emphysema with scattered areas of subpleural blebs bilaterally. Minimal posteromedial subpleural bandlike nodular scarring in the right upper lobe, slightly worse. Chronic posterolateral pleural thickening with pleural fat proliferation, unchanged. No pleural effusion or pneumothorax. No superimposed acute airspace process, collapse or consolidation. Upper Abdomen: No acute upper abdominal finding. Musculoskeletal: No acute osseous finding. Review of the MIP images confirms the above findings. IMPRESSION: Similar mild aneurysmal dilatation of the ascending thoracic  aorta, maximal diameter 4 cm. Recommend annual imaging followup by CTA or MRA. This recommendation follows 2010 ACCF/AHA/AATS/ACR/ASA/SCA/SCAI/SIR/STS/SVM Guidelines for the Diagnosis and Management of Patients with Thoracic Aortic Disease. Circulation. 2010; 121: T581-K256. Aortic aneurysm NOS (ICD10-I71.9) No other acute intrathoracic vascular finding. Negative for acute pulmonary embolus. No dissection. Stable severe mixed paraseptal and centrilobular emphysema pattern with pleuroparenchymal scarring. Minimal increased right upper lobe posteromedial subpleural mild nodular scarring noted. No superimposed acute airspace process. Aortic Atherosclerosis (ICD10-I70.0) and Emphysema (ICD10-J43.9). Electronically Signed   By: Judie Petit.  Shick M.D.   On: 04/15/2021 08:36    Assessment & Plan:   Montel was seen today for annual exam, diabetes, hyperlipidemia and hypertension.  Diagnoses and all orders for this visit:  Chronic respiratory failure with hypoxia (HCC)- He is stable on continuous oxygen and well compensated.  Essential hypertension, benign- His blood pressure is overcontrolled and he is symptomatic.  I recommended that he stop taking the thiazide diuretic. -     Basic metabolic panel; Future -     TSH; Future -     CBC with Differential/Platelet; Future -     EKG 12-Lead -     CBC with Differential/Platelet -     TSH -     Basic metabolic panel  Type II diabetes mellitus with manifestations (HCC)- His A1c is up to 7.6%.  I recommended that he start taking a GLP-1 agonist. -     Basic metabolic panel; Future -     Hemoglobin A1c; Future -     Microalbumin / creatinine urine ratio; Future -     Microalbumin / creatinine urine ratio -     Hemoglobin A1c -     Basic metabolic panel -     Semaglutide (RYBELSUS) 3 MG TABS; Take 3 mg by mouth daily. -     HM Diabetes Foot Exam  Benign prostatic hyperplasia with post-void dribbling- His PSA is normal and his symptoms are adequately well  controlled. -     PSA; Future -     Urinalysis, Routine w reflex microscopic; Future -     Urinalysis, Routine w reflex microscopic -     PSA  Hyperlipidemia with target LDL less than 130- He is not willing to take a statin. -  Lipid panel; Future -     TSH; Future -     Hepatic function panel; Future -     Hepatic function panel -     TSH -     Lipid panel  Encounter for general adult medical examination with abnormal findings- Exam completed, labs reviewed, vaccines reviewed and updated, cancer screenings addressed, patient education was given.  Pure hyperglyceridemia  Vitamin D deficiency disease  Screen for colon cancer -     Cologuard  Statin myopathy- He will not take a statin.  Other orders -     LDL cholesterol, direct   I have discontinued Nina Hoar. Gaiser's hydrochlorothiazide and Moderna COVID-19 Vaccine. I am also having him start on Rybelsus. Additionally, I am having him maintain his aspirin, NON FORMULARY, fexofenadine, OneTouch Verio, CVS D3, Insulin Pen Needle, onetouch ultrasoft, OneTouch Verio, OneTouch Verio, Stiolto Respimat, metFORMIN, olmesartan, Klor-Con M20, ezetimibe, and famotidine.  Meds ordered this encounter  Medications   Semaglutide (RYBELSUS) 3 MG TABS    Sig: Take 3 mg by mouth daily.    Dispense:  30 tablet    Refill:  0     Follow-up: Return in about 6 months (around 04/25/2022).  Scarlette Calico, MD

## 2021-10-26 NOTE — Patient Instructions (Signed)

## 2021-10-27 ENCOUNTER — Other Ambulatory Visit: Payer: Self-pay | Admitting: *Deleted

## 2021-10-27 ENCOUNTER — Encounter: Payer: Self-pay | Admitting: *Deleted

## 2021-10-27 NOTE — Patient Outreach (Signed)
[facility]) Care Management Telephonic RN Care Manager Note   10/31/2021 Name:  [name] MRN:  [id number] DOB:  03/05/1956  Summary: Follow up outreach  [name] reports he is doing well  He reports he was seen by his primary care provider (PCP) 10/26/21 He was taken off hydrochlorothiazide but voices concern about edema related to his other medical conditions. He is monitoring for edema and will report any worsening changes to pcp  Diabetes HgA1c discuss -there was an increase 7.6 (was 7.2 )   He reminds RN CM his general trend of increase during the fall/winter seasons   When he is not able to go outside to produce vegetables in his garden      Socialization  He did spend time with a cousin and family during the holidays  Recommendations/Changes made from today's visit: Discussed [facility] warm transfer to external care management services - He agreed "as long as someone checks on me" He reports appreciating having someone to check on him as he does need encouragement and stays lonely Progression to external care management has been discussed today and in 2022   Subjective: [name] is an 66 y.o. year old divorced male who is a primary patient of Eric Lima, MD. The care management team was consulted for assistance with care management and/or care coordination needs.   [name] was referred to [facility] on 10/27/20 as a MD referral from [name] for screening and concerns with diabetes management He did show improvement during the spring and summer months.  Telephonic RN Care Manager completed Telephone Visit today.   Objective:  Medications Reviewed Today     Reviewed by Eric Lima, MD (Physician) on 10/26/21 at 1143  Med List Status: <None>   Medication Order Taking? Sig Documenting Provider Last Dose Status Informant  aspirin 81 MG EC tablet [id number] Yes Take 81 mg by mouth daily. [provider] Taking Active   Blood Glucose Monitoring  Suppl (ONETOUCH VERIO) w/Device KIT [id number] Yes 1 Act by Does not apply route in the morning and at bedtime. Eric Lima, MD Taking Active   CVS D3 50 MCG [id number] UT) CAPS [id number] Yes TAKE 2 CAPSULES (4,000 UNITS TOTAL) BY MOUTH DAILY. Eric Lima, MD Taking Active   ezetimibe (ZETIA) 10 MG tablet [id number] Yes TAKE 1 TABLET BY MOUTH EVERY DAY Eric Lima, MD Taking Active   famotidine (PEPCID) 40 MG tablet [id number] Yes TAKE 1 TABLET BY MOUTH EVERY DAY Eric Lima, MD Taking Active   fexofenadine (ALLEGRA) 180 MG tablet [id number] Yes Take 180 mg by mouth daily as needed for allergies. [provider] Taking Active   glucose blood (ONETOUCH VERIO) test strip [id number] Yes 1 each by Other route in the morning and at bedtime. Use as instructed Eric Lima, MD Taking Active   Insulin Pen Needle 32G X 6 MM MISC [id number] Yes 1 Act by Does not apply route daily. Eric Lima, MD Taking Active   KLOR-CON M20 20 MEQ tablet [id number] Yes TAKE 1 TABLET BY MOUTH TWICE A DAY Eric Lima, MD Taking Active   Lancets [facility] ULTRASOFT) lancets [id number] Yes  [provider] Taking Active   metFORMIN (GLUCOPHAGE) 1000 MG tablet [id number] Yes TAKE 1 TABLET (1,000 MG TOTAL) BY MOUTH 2 (TWO) TIMES DAILY WITH A MEAL. Eric Lima, MD Taking Active   NON FORMULARY [id number] Yes Oxygen continuous 1.5 liters with activity [provider] Taking Active   olmesartan (BENICAR) 40 MG tablet [id number] Yes TAKE 1 TABLET BY MOUTH EVERY DAY Eric Lima, MD Taking Active   [facility] VERIO test strip [id number] Yes USE 1 PER DAY Eric Shin, MD Taking Active   Tiotropium Bromide-Olodaterol (STIOLTO RESPIMAT) 2.5-2.5 MCG/ACT AERS [id number] Yes Inhale 2 puffs into the lungs daily. Eric Lima, MD Taking Active            Patient Active Problem List   Diagnosis Date Noted   Statin myopathy 04/25/2021   Encounter for general adult medical examination with  abnormal findings 10/26/2020   Vitamin D deficiency disease 06/12/2018   GERD with esophagitis 12/12/2017   Thoracic aortic aneurysm without rupture 12/12/2017   Screen for colon cancer 01/12/2016   Routine general medical examination at a health care facility 07/16/2015   Benign prostatic hyperplasia 07/13/2014   Type II diabetes mellitus with manifestations ([place]) 11/12/2013   Pure hyperglyceridemia 07/09/2013   Upper airway cough syndrome 02/06/2012   Hyperlipidemia with target LDL less than 130 07/05/2011   EMPHYSEMA 12/07/2008   Essential hypertension, benign 08/26/2008   INTERSTITIAL LUNG DISEASE 08/26/2008   Respiratory failure with hypoxia ([place]) 08/26/2008     SDOH:  (Social Determinants of Health) assessments and interventions performed:  SDOH Interventions    Flowsheet Row Most Recent Value  SDOH Interventions   Food Insecurity Interventions Intervention Not Indicated  Financial Strain Interventions Intervention Not Indicated  Housing Interventions Intervention Not Indicated  Intimate Partner Violence Interventions Intervention Not Indicated  Stress Interventions Intervention Not Indicated  Social Connections Interventions Intervention Not Indicated  Transportation Interventions Intervention Not Indicated       Care Plan  Review of patient past medical history, allergies, medications, health status, including review of consultants reports, laboratory and other test data, was performed as part of comprehensive evaluation for care management services.   Care Plan : RN Care Manager Plan of Care  Updates made by Eric Faster, RN since 10/31/2021 12:00 AM  Completed 10/31/2021   Problem: Complex Care Coordination Needs and disease management in patient with Diabetes, emphysema Resolved 10/27/2021  Priority: High     Long-Range Goal: Establish Plan of Care for Management Complex SDOH Barriers, disease management and Care Coordination Needs in patient with Diabetes,  emphysema Completed 10/27/2021  Start Date: 08/23/2021  This Visit's Progress: On track  Priority: High  Note:   Current Barriers:  Knowledge Deficits related to plan of care for management of COPD and DMII Care Coordination needs related to Limited education about DM II and COPD*  RN CM Clinical Goal(s):  Patient will verbalize understanding of plan for management of COPD and DMII demonstrate Improved adherence to prescribed treatment plan for COPD and DMII as evidenced by improved cbg and HgA1c values  through collaboration with RN Care manager, provider, and care team.   Interventions: Provide education for major medical conditions Discussed pcp office RN CM availability with pending warm transfer Inter-disciplinary care team collaboration (see longitudinal plan of care) Evaluation of current treatment plan related to  self management and patient's adherence to plan as established by provider   Diabetes Interventions: condition stable  Assessed patient's understanding of A1c goal: <7% Reviewed medications with patient and discussed importance of medication adherence; Counseled on importance of regular laboratory monitoring as prescribed; Discussed plans with patient for ongoing care management follow up and provided patient with direct contact information for care management team; Provided patient with written educational materials related to hypo  and hyperglycemia and importance of correct treatment; Discussed monitoring for triggers (eating out, fries, pies, etc) Reviewed his concern with less winter vegetables than summer vegetables. Discussed some winter vegetables. He voices liking cabbage, broccoli, turnip and cauliflower he will try Lab Results  Component Value Date   HGBA1C 7.6 (H) 10/26/2021  10/27/21 He was taken off hydrochlorothiazide but voices concern about edema related to his other medical conditions. He is monitoring for edema and will report any worsening changes to  pcp HgA1c discuss -there was an increase 7.6 (was 7.2 )He reminds RN CM his general trend of increase during the fall/winter seasons When he is not able to go outside to produce vegetables in his garden  COPD Interventions:   condition stable Advised patient to track and manage COPD triggers Assessed social determinant of health barriers Encourage use of oxygen  Patient Goals/Self-Care Activities: Patient will self administer medications as prescribed Patient will attend all scheduled provider appointments Patient will call pharmacy for medication refills Patient will continue to perform ADL's independently Patient will continue to perform IADL's independently Patient will call provider office for new concerns or questions Continue covid pandemic precautions (use of mask, handwashing, distancing)  Follow Up Plan:  The patient has been provided with contact information for the care management team and has been advised to call with any health related questions or concerns.  [facility] discussed       Plan: The patient has been provided with contact information for the care management team and has been advised to call with any health related questions or concerns.  [name] will be warm transferred to Embedded RN CM staff   Joelene Millin L. Lavina Hamman, RN, BSN, [place] Coordinator Office number [phone] [facility] number [phone] Fax number [phone]

## 2021-10-28 MED ORDER — RYBELSUS 3 MG PO TABS: 3.0000 mg | ORAL_TABLET | Freq: Every day | ORAL | 0 refills | Status: DC

## 2021-10-29 ENCOUNTER — Encounter: Payer: Self-pay | Admitting: Internal Medicine

## 2021-11-02 DIAGNOSIS — Z1211 Encounter for screening for malignant neoplasm of colon: Secondary | ICD-10-CM | POA: Diagnosis not present

## 2021-11-11 ENCOUNTER — Other Ambulatory Visit: Payer: Self-pay | Admitting: Internal Medicine

## 2021-11-11 DIAGNOSIS — E118 Type 2 diabetes mellitus with unspecified complications: Secondary | ICD-10-CM

## 2021-11-11 DIAGNOSIS — I10 Essential (primary) hypertension: Secondary | ICD-10-CM

## 2021-11-11 LAB — COLOGUARD: COLOGUARD: NEGATIVE

## 2021-11-11 MED ORDER — METFORMIN HCL 1000 MG PO TABS: 1000.0000 mg | ORAL_TABLET | Freq: Two times a day (BID) | ORAL | 1 refills | Status: DC

## 2021-11-14 ENCOUNTER — Other Ambulatory Visit: Payer: Self-pay | Admitting: Internal Medicine

## 2021-11-14 DIAGNOSIS — E118 Type 2 diabetes mellitus with unspecified complications: Secondary | ICD-10-CM

## 2021-11-14 DIAGNOSIS — I10 Essential (primary) hypertension: Secondary | ICD-10-CM

## 2021-11-16 ENCOUNTER — Ambulatory Visit (INDEPENDENT_AMBULATORY_CARE_PROVIDER_SITE_OTHER): Payer: Medicare Other | Admitting: *Deleted

## 2021-11-16 DIAGNOSIS — J9611 Chronic respiratory failure with hypoxia: Secondary | ICD-10-CM

## 2021-11-16 DIAGNOSIS — E118 Type 2 diabetes mellitus with unspecified complications: Secondary | ICD-10-CM

## 2021-11-16 NOTE — Chronic Care Management (AMB) (Signed)
Chronic Care Management   CCM RN Visit Note  11/16/2021 Name: [name] MRN: [id number] DOB: 05/28/1956  Subjective: [name] is a 66 y.o. year old male who is a primary care patient of [name], MD. The care management team was consulted for assistance with disease management and care coordination needs.    Engaged with patient by telephone for initial visit in response to provider referral for case management and/or care coordination services.   Consent to Services:  The patient was given information about Chronic Care Management services, agreed to services, and gave verbal consent 06/18/20 prior to initiation of services.  Please see initial visit note for detailed documentation.  Patient agreed to services and verbal consent obtained.   Assessment: Review of patient past medical history, allergies, medications, health status, including review of consultants reports, laboratory and other test data, was performed as part of comprehensive evaluation and provision of chronic care management services.   SDOH (Social Determinants of Health) assessments and interventions performed:  SDOH Interventions    Flowsheet Row Most Recent Value  SDOH Interventions   Food Insecurity Interventions Intervention Not Indicated  [Denies food insecurity,  does not get food stamps or other food assistance,  denies needs]  Financial Strain Interventions Intervention Not Indicated  Housing Interventions Intervention Not Indicated  [lives alone in one level single family double wide mobile home x 16 years]  Transportation Interventions Intervention Not Indicated  [Patient drives self]      CCM Care Plan  Allergies  Allergen Reactions   Latex    Morphine    Statins     REACTION: weakness, muscle pains   Outpatient Encounter Medications as of 11/16/2021  Medication Sig Note   aspirin 81 MG EC tablet Take 81 mg by mouth daily.    Blood Glucose Monitoring Suppl (ONETOUCH VERIO) w/Device KIT  1 Act by Does not apply route in the morning and at bedtime.    [employer] D3 50 MCG (2000 UT) CAPS TAKE 2 CAPSULES (4,000 UNITS TOTAL) BY MOUTH DAILY.    ezetimibe (ZETIA) 10 MG tablet TAKE 1 TABLET BY MOUTH EVERY DAY    famotidine (PEPCID) 40 MG tablet TAKE 1 TABLET BY MOUTH EVERY DAY    fexofenadine (ALLEGRA) 180 MG tablet Take 180 mg by mouth daily as needed for allergies.    glucose blood (ONETOUCH VERIO) test strip 1 each by Other route in the morning and at bedtime. Use as instructed    Insulin Pen Needle 32G X 6 MM MISC 1 Act by Does not apply route daily.    KLOR-CON M20 20 MEQ tablet TAKE 1 TABLET BY MOUTH TWICE A DAY    Lancets (ONETOUCH ULTRASOFT) lancets     metFORMIN (GLUCOPHAGE) 1000 MG tablet Take 1 tablet (1,000 mg total) by mouth 2 (two) times daily with a meal.    NON FORMULARY Oxygen continuous 1.5 liters with activity    olmesartan (BENICAR) 40 MG tablet TAKE 1 TABLET BY MOUTH EVERY DAY    ONETOUCH VERIO test strip USE 1 PER DAY    Semaglutide (RYBELSUS) 3 MG TABS Take 3 mg by mouth daily. 11/16/2021: 11/16/21- reports is not taking yet; has obtained from outpatient pharmacy, but reports is waiting to start taking until he finishes all of his previously prescribed Soliqua insulin-- which he reports only taking "once or twice a week"   Tiotropium Bromide-Olodaterol (STIOLTO RESPIMAT) 2.5-2.5 MCG/ACT AERS Inhale 2 puffs into the lungs daily.    No  facility-administered encounter medications on file as of 11/16/2021.   Patient Active Problem List   Diagnosis Date Noted   Statin myopathy 04/25/2021   Encounter for general adult medical examination with abnormal findings 10/26/2020   Vitamin D deficiency disease 06/12/2018   GERD with esophagitis 12/12/2017   Thoracic aortic aneurysm without rupture 12/12/2017   Screen for colon cancer 01/12/2016   Routine general medical examination at a health care facility 07/16/2015   Benign prostatic hyperplasia 07/13/2014   Type II diabetes  mellitus with manifestations ([place]) 11/12/2013   Pure hyperglyceridemia 07/09/2013   Upper airway cough syndrome 02/06/2012   Hyperlipidemia with target LDL less than 130 07/05/2011   EMPHYSEMA 12/07/2008   Essential hypertension, benign 08/26/2008   INTERSTITIAL LUNG DISEASE 08/26/2008   Respiratory failure with hypoxia ([place]) 08/26/2008   Conditions to be addressed/monitored:  COPD and DMII  Care Plan : RN Care Manager Plan of Care  Updates made by Knox Royalty, RN since 11/16/2021 12:00 AM     Problem: Chronic Disease Management Needs   Priority: High     Long-Range Goal: Development of plan of care for long term chronic disease management   Start Date: 11/16/2021  Expected End Date: 11/16/2022  Priority: High  Note:   Current Barriers:  Chronic Disease Management support and education needs related to COPD and DMII  RNCM Clinical Goal(s):  Patient will demonstrate ongoing health management independence as evidenced by adherence to plan of care for DMII; COPD        through collaboration with RN Care manager, provider, and care team.  Medication adherence issues: CCM Pharmacy team active  Interventions: 1:1 collaboration with primary care provider regarding development and update of comprehensive plan of care as evidenced by provider attestation and co-signature Inter-disciplinary care team collaboration (see longitudinal plan of care) Evaluation of current treatment plan related to  self management and patient's adherence to plan as established by provider 11/16/21: Initial Assessment completed Pain Assessment completed: denies acute/ chronic pain; has occasional "aches and pains" which is well controlled with OTC medications (Ibuprofen, acetaminophen) Falls assessment updated: denies new/ recent falls x 12 months; does not use assistive devices, but has cane and walker "if needed" at his home; reports feels "steady" on his feet SDOH assessment completed: no concerns/ unmet needs  identified; patient does report that he has not had dental provider care "for many years, maybe decades;" however, at this time he declines [place] referral; states he "will think about it and let me know" Medications discussed; patient declines full medication review; confirmed CCM Pharmacy team active; reviewed recent changes during PCP office visit Confirmed has obtained Rybelsus but has not yet started taking-- reports he has "a lot" of (previously prescribed) Soliqua insulin that he "paid a lot of money for;" states he is going to use all of his insulin before he starts taking newly prescribed Rybelsus Reports currently only using previously prescribed Soliqua "once or twice a week;" confirms he does not use QD; states he "decides whether or not to use based on what his daily blood sugars are Discussed with patient recommended dose of previously prescribed Soliqua insulin: 20 U QD; he reports when he uses, he takes between "10 and 20 Units" Confirmed patient continues taking metformin as prescribed Confirmed he has stopped taking HCTZ, as advised 10/26/21; states since stopping, he has noticed "a little bit of swelling" in his ankles- states "it's not too bad, and it is not as bad  as it was the first day I stopped it;" encouraged patient to continue to monitor bilateral LE swelling and to call PCP for follow up visit if he remains concerned about new onset of swelling post- discontinuation of HCTZ Will update CCM Pharmacy team on today's medication assessment and patient's feedback on how is managing medications: he is very concerned about "going back into the donut hole" Confirmed patient independently self-manages medications  COPD: (Status: 11/16/21: New goal.) Long Term Goal  Reviewed medications with patient, including use of prescribed maintenance and rescue inhalers, and provided instruction on medication management and the importance of adherence Provided patient with  basic written and verbal COPD education on self care/management/and exacerbation prevention Advised patient to track and manage COPD triggers Discussed the importance of adequate rest and management of fatigue with COPD Discussed patient's use of home oxygen: reports he takes portable oxygen out with him when he goes outside of home; has back-pack that he carries tank in; reports otherwise, he uses only prn at home, several times each week, but not daily; uses home O2 between 1-3 L/min, depending on degree of shortness of breath; reports no concerns about home O2 delivery system Confirmed patient has and uses both maintenance and rescue inhalers at home: verbalizes good general understanding of difference between inhalers and use of both Discussed action plan for COPD flares: patient has good understanding of same; starts with rest, rescue inhaler, home O2 use  Diabetes:  (Status: 11/16/21: New goal.) Long Term Goal   Lab Results  Component Value Date   HGBA1C 7.6 (H) 10/26/2021    Assessed patient's understanding of A1c goal: <7% Provided education to patient about basic DM disease process; Reviewed prescribed diet with patient low sugar/ carbohydrate modified; heart healthy ; Discussed plans with patient for ongoing care management follow up and provided patient with direct contact information for care management team;      Reviewed scheduled/upcoming provider appointments including: 02/15/22- CCM pharmacy; 04/26/22- PCP office visit;         Review of patient status, including review of consultants reports, relevant laboratory and other test results, and medications completed;       Confirmed patient monitors/ records blood sugars at home fasting and then "right before I eat;" discussed optimal timing of blood sugar monitoring and encouraged patient to consider starting to monitor blood sugars 2 hours after eating regular size meal-- explained that post-prandial readings provide good general  information about how well prescribed medications are working-- he will consider Confirmed patient is not very active in winter months, but is very active in summer months-- he attributes his recent increase in A1-C to "it's winter-- it will go back down in the summer" Reviewed recently obtained blood sugars at home: reports fasting blood sugars consistently between 150-190 Assessed patient's understanding of meaning/ significance of A1-C values: good baseline understanding of same- Reviewed individual historical A1-C trends and provided education around correlation of A1-C value to blood sugar levels at home over 3 months  Patient Goals/Self-Care Activities: As evidenced by review of EHR, collaboration with care team, and patient reporting during CCM RN CM outreach,  Patient Lurena Joiner will: Take medications as prescribed Attend all scheduled provider appointments Call pharmacy for medication refills Call provider office for new concerns or questions Continue to check fasting (first thing in the morning, before eating) blood sugars at home; it is a good idea to check your blood sugars again 2 hours after eating a regular sized meal-- this blood sugar will  give your doctor good information about how well your medications are working Continue to follow heart healthy, low salt, low cholesterol, carbohydrate-modified, low sugar diet Be sure to maintain contact with the pharmacist at Dr. Ronnald Ramp' office to make sure your medications are optimized Review enclosed educational material        Plan: Telephone follow up appointment with care management team member scheduled for:  Wednesday, November 30, 2021 at 11:30 am The patient has been provided with contact information for the care management team and has been advised to call with any health related questions or concerns  Oneta Rack, RN, BSN, [place] [phone]: direct  office

## 2021-11-16 NOTE — Patient Instructions (Signed)
Visit Information   Toul[place] Moos) [name], thank you for taking time to talk with me today. Please don't hesitate to contact me if I can be of assistance to you before our next scheduled telephone appointment.  Below are the goals we discussed today:  Patient Self-Care Activities: Patient [name] will: Take medications as prescribed Attend all scheduled provider appointments Call pharmacy for medication refills Call provider office for new concerns or questions Continue to check fasting (first thing in the morning, before eating) blood sugars at home; it is a good idea to check your blood sugars again 2 hours after eating a regular sized meal-- this blood sugar will give your doctor good information about how well your medications are working Continue to follow heart healthy, low salt, low cholesterol, carbohydrate-modified, low sugar diet Be sure to maintain contact with the pharmacist at Dr. Ronnald Ramp' office to make sure your medications are optimized Review enclosed educational material   Our next scheduled telephone follow up visit/ appointment with care management team member is scheduled on:   Wednesday November 30, 2021 at 11:30 am- This is a PHONE [place] appointment  If you need to cancel or re-schedule our visit, please call [phone] and our care guide team will be happy to assist you.   I look forward to hearing about your progress.   Oneta Rack, RN, BSN, [place] [phone]: direct office  If you are experiencing a Mental Health or [place] or need someone to talk to, please  call the Suicide and Crisis Lifeline: 988 call the [place] National Suicide Prevention Lifeline: 415-527-8451 or TTY: 252-004-6315 TTY (289) 358-6827) to talk to a trained counselor call 1-800-273-TALK (toll free, 24 hour hotline) go to [facility] ([phone]) call  911   Blood Glucose Monitoring, Adult Monitoring your blood sugar (glucose) is an important part of managing your diabetes. Blood glucose monitoring involves checking your blood glucose as often as directed and keeping a log or record of your results over time. Checking your blood glucose regularly and keeping a blood glucose log can: Help you and your health care provider adjust your diabetes management plan as needed, including your medicines or insulin. Help you understand how food, exercise, illnesses, and medicines affect your blood glucose. Let you know what your blood glucose is at any time. You can quickly find out if you have low blood glucose (hypoglycemia) or high blood glucose (hyperglycemia). Your health care provider will set individualized treatment goals for you. Your goals will be based on your age, other medical conditions you have, and how you respond to diabetes treatment. Generally, the goal of treatment is to maintain the following blood glucose levels: Before meals (preprandial): 80-130 mg/dL (4.4-7.2 mmol/L). After meals (postprandial): below 180 mg/dL (10 mmol/L). A1C level: less than 7%. Supplies needed: Blood glucose meter. Test strips for your meter. Each meter has its own strips. You must use the strips that came with your meter. A needle to prick your finger (lancet). Do not use a lancet more than one time. A device that holds the lancet (lancing device). A journal or log book to write down your results. How to check your blood glucose Checking your blood glucose  Wash your hands for at least 20 seconds with soap and water. Prick the side of your finger (not the tip) with the lancet. Do not use the same finger consecutively. Gently rub the finger  until a small drop of blood appears. Follow instructions that come with your meter for inserting the test strip, applying blood to the strip, and using your blood glucose meter. Write down your result and any notes in  your log. Using alternative sites Some meters allow you to use areas of your body other than your finger (alternative sites) to test your blood. The most common alternative sites are the forearm, the thigh, and the palm of your hand. Alternative sites may not be as accurate as the fingers because blood flow is slower in those areas. This means that the result you get may be delayed, and it may be different from the result that you would get from your finger. Use the finger only, and do not use alternative sites, if: You think you have hypoglycemia. You sometimes do not know that your blood glucose is getting low (hypoglycemia unawareness). General tips and recommendations Blood glucose log  Every time you check your blood glucose, write down your result. Also write down any notes about things that may be affecting your blood glucose, such as your diet and exercise for the day. This information can help you and your health care provider: Look for patterns in your blood glucose over time. Adjust your diabetes management plan as needed. Check if your meter allows you to download your records to a computer or if there is an app for the meter. Most glucose meters store a record of glucose readings in the meter. If you have type 1 diabetes: Check your blood glucose 4 or more times a day if you are on intensive insulin therapy with multiple daily injections (MDI) or if you are using an insulin pump. Check your blood glucose: Before every meal and snack. Before bedtime. Also check your blood glucose: If you have symptoms of hypoglycemia. After treating low blood glucose. Before doing activities that create a risk for injury, like driving or using machinery. Before and after exercise. Two hours after a meal. Occasionally between 2:00 a.m. and 3:00 a.m., as directed. You may need to check your blood glucose more often, 6-10 times per day, if: You have diabetes that is not well controlled. You are  ill. You have a history of severe hypoglycemia. You have hypoglycemia unawareness. If you have type 2 diabetes: Check your blood glucose 2 or more times a day if you take insulin or other diabetes medicines. Check your blood glucose 4 or more times a day if you are on intensive insulin therapy. Occasionally, you may also need to check your glucose between 2:00 a.m. and 3:00 a.m., as directed. Also check your blood glucose: Before and after exercise. Before doing activities that create a risk for injury, like driving or using machinery. You may need to check your blood glucose more often if: Your medicine is being adjusted. Your diabetes is not well controlled. You are ill. General tips Make sure you always have your supplies with you. After you use a few boxes of test strips, adjust (calibrate) your blood glucose meter by following instructions that came with your meter. If you have questions or need help, all blood glucose meters have a 24-hour hotline phone number available that you can call. Also contact your health care provider with questions or concerns you may have. Where to find more information The American Diabetes Association: www.diabetes.org The Association of Diabetes Care & Education Specialists: www.diabeteseducator.org Contact a health care provider if: Your blood glucose is at or above 240 mg/dL (13.3 mmol/L) for  2 days in a row. You have been sick or have had a fever for 2 days or longer, and you are not getting better. You have any of the following problems for more than 6 hours: You cannot eat or drink. You have nausea or vomiting. You have diarrhea. Get help right away if: Your blood glucose is lower than 54 mg/dL (3 mmol/L). You become confused, or you have trouble thinking clearly. You have difficulty breathing. You have moderate or large ketone levels in your urine. These symptoms may represent a serious problem that is an emergency. Do not wait to see if the  symptoms will go away. Get medical help right away. Call your local emergency services (911 in the [place] Do not drive yourself to the hospital. Summary Monitoring your blood glucose is an important part of managing your diabetes. Blood glucose monitoring involves checking your blood glucose as often as directed and keeping a log or record of your results over time. Your health care provider will set individualized treatment goals for you. Your goals will be based on your age, other medical conditions you have, and how you respond to diabetes treatment. Every time you check your blood glucose, write down your result. Also, write down any notes about things that may be affecting your blood glucose, such as your diet and exercise for the day. This information is not intended to replace advice given to you by your health care provider. Make sure you discuss any questions you have with your health care provider. Document Revised: 06/30/2020 Document Reviewed: 06/30/2020 Elsevier Patient Education  2022 [employer].  Following is a copy of your full care plan:  Care Plan : RN Care Manager Plan of Care  Updates made by Knox Royalty, RN since 11/16/2021 12:00 AM     Problem: Chronic Disease Management Needs   Priority: High     Long-Range Goal: Development of plan of care for long term chronic disease management   Start Date: 11/16/2021  Expected End Date: 11/16/2022  Priority: High  Note:   Current Barriers:  Chronic Disease Management support and education needs related to COPD and DMII  RNCM Clinical Goal(s):  Patient will demonstrate ongoing health management independence as evidenced by adherence to plan of care for DMII; COPD        through collaboration with RN Care manager, provider, and care team.  Medication adherence issues: CCM Pharmacy team active  Interventions: 1:1 collaboration with primary care provider regarding development and update of comprehensive plan of care as  evidenced by provider attestation and co-signature Inter-disciplinary care team collaboration (see longitudinal plan of care) Evaluation of current treatment plan related to  self management and patient's adherence to plan as established by provider 11/16/21: Initial Assessment completed Pain Assessment completed: denies acute/ chronic pain; has occasional "aches and pains" which is well controlled with OTC medications (Ibuprofen, acetaminophen) Falls assessment updated: denies new/ recent falls x 12 months; does not use assistive devices, but has cane and walker "if needed" at his home; reports feels "steady" on his feet SDOH assessment completed: no concerns/ unmet needs identified; patient does report that he has not had dental provider care "for many years, maybe decades;" however, at this time he declines [place] referral; states he "will think about it and let me know" Medications discussed; patient declines full medication review; confirmed CCM Pharmacy team active; reviewed recent changes during PCP office visit Confirmed has obtained Rybelsus but has not yet started  taking-- reports he has "a lot" of (previously prescribed) Soliqua insulin that he "paid a lot of money for;" states he is going to use all of his insulin before he starts taking newly prescribed Rybelsus Reports currently only using previously prescribed Soliqua "once or twice a week;" confirms he does not use QD; states he "decides whether or not to use based on what his daily blood sugars are Discussed with patient recommended dose of previously prescribed Soliqua insulin: 20 U QD; he reports when he uses, he takes between "10 and 20 Units" Confirmed patient continues taking metformin as prescribed Confirmed he has stopped taking HCTZ, as advised 10/26/21; states since stopping, he has noticed "a little bit of swelling" in his ankles- states "it's not too bad, and it is not as bad as it was the first day I  stopped it;" encouraged patient to continue to monitor bilateral LE swelling and to call PCP for follow up visit if he remains concerned about new onset of swelling post- discontinuation of HCTZ Will update CCM Pharmacy team on today's medication assessment and patient's feedback on how is managing medications: he is very concerned about "going back into the donut hole" Confirmed patient independently self-manages medications  COPD: (Status: 11/16/21: New goal.) Long Term Goal  Reviewed medications with patient, including use of prescribed maintenance and rescue inhalers, and provided instruction on medication management and the importance of adherence Provided patient with basic written and verbal COPD education on self care/management/and exacerbation prevention Advised patient to track and manage COPD triggers Discussed the importance of adequate rest and management of fatigue with COPD Discussed patient's use of home oxygen: reports he takes portable oxygen out with him when he goes outside of home; has back-pack that he carries tank in; reports otherwise, he uses only prn at home, several times each week, but not daily; uses home O2 between 1-3 L/min, depending on degree of shortness of breath; reports no concerns about home O2 delivery system Confirmed patient has and uses both maintenance and rescue inhalers at home: verbalizes good general understanding of difference between inhalers and use of both Discussed action plan for COPD flares: patient has good understanding of same; starts with rest, rescue inhaler, home O2 use  Diabetes:  (Status: 11/16/21: New goal.) Long Term Goal   Lab Results  Component Value Date   HGBA1C 7.6 (H) 10/26/2021    Assessed patient's understanding of A1c goal: <7% Provided education to patient about basic DM disease process; Reviewed prescribed diet with patient low sugar/ carbohydrate modified; heart healthy ; Discussed plans with patient for ongoing care  management follow up and provided patient with direct contact information for care management team;      Reviewed scheduled/upcoming provider appointments including: 02/15/22- CCM pharmacy; 04/26/22- PCP office visit;         Review of patient status, including review of consultants reports, relevant laboratory and other test results, and medications completed;       Confirmed patient monitors/ records blood sugars at home fasting and then "right before I eat;" discussed optimal timing of blood sugar monitoring and encouraged patient to consider starting to monitor blood sugars 2 hours after eating regular size meal-- explained that post-prandial readings provide good general information about how well prescribed medications are working-- he will consider Confirmed patient is not very active in winter months, but is very active in summer months-- he attributes his recent increase in A1-C to "it's winter-- it will go back down in the summer"  Reviewed recently obtained blood sugars at home: reports fasting blood sugars consistently between 150-190 Assessed patient's understanding of meaning/ significance of A1-C values: good baseline understanding of same- Reviewed individual historical A1-C trends and provided education around correlation of A1-C value to blood sugar levels at home over 3 months  Patient Goals/Self-Care Activities: As evidenced by review of EHR, collaboration with care team, and patient reporting during CCM RN CM outreach,  Patient [name] will: Take medications as prescribed Attend all scheduled provider appointments Call pharmacy for medication refills Call provider office for new concerns or questions Continue to check fasting (first thing in the morning, before eating) blood sugars at home; it is a good idea to check your blood sugars again 2 hours after eating a regular sized meal-- this blood sugar will give your doctor good information about how well your medications are  working Continue to follow heart healthy, low salt, low cholesterol, carbohydrate-modified, low sugar diet Be sure to maintain contact with the pharmacist at Dr. Ronnald Ramp' office to make sure your medications are optimized Review enclosed educational material        Consent to CCM Services: Mr. Collister was given information about Chronic Care Management services 06/18/20 including:  CCM service includes personalized support from designated clinical staff supervised by his physician, including individualized plan of care and coordination with other care providers 24/7 contact phone numbers for assistance for urgent and routine care needs. Service will only be billed when office clinical staff spend 20 minutes or more in a month to coordinate care. Only one practitioner may furnish and bill the service in a calendar month. The patient may stop CCM services at any time (effective at the end of the month) by phone call to the office staff. The patient will be responsible for cost sharing (co-pay) of up to 20% of the service fee (after annual deductible is met).  Patient agreed to services and verbal consent obtained.   The patient verbalized understanding of instructions, educational materials, and care plan provided today and agreed to receive a mailed copy of patient instructions, educational materials, and care plan  Telephone follow up appointment with care management team member scheduled for:  Wednesday, November 30, 2021 at 11:30 am The patient has been provided with contact information for the care management team and has been advised to call with any health related questions or concerns

## 2021-11-25 NOTE — Progress Notes (Signed)
CCM f/u visit scheduled for 12/06/2021

## 2021-11-30 ENCOUNTER — Ambulatory Visit: Payer: Medicare Other | Admitting: *Deleted

## 2021-11-30 DIAGNOSIS — E118 Type 2 diabetes mellitus with unspecified complications: Secondary | ICD-10-CM

## 2021-11-30 DIAGNOSIS — J438 Other emphysema: Secondary | ICD-10-CM

## 2021-11-30 DIAGNOSIS — J9611 Chronic respiratory failure with hypoxia: Secondary | ICD-10-CM

## 2021-11-30 NOTE — Patient Instructions (Signed)
Visit Information  [name]) [name], thank you for taking time to talk with me today. Please don't hesitate to contact me if I can be of assistance to you before our next scheduled telephone appointment.  Below are the goals we discussed today:  Patient Self-Care Activities: Patient [name] will: Take medications as prescribed Attend all scheduled provider appointments Call pharmacy for medication refills Call provider office for new concerns or questions Continue to check fasting (first thing in the morning, before eating) blood sugars at home; it is a good idea to check your blood sugars again 2 hours after eating a regular sized meal-- this blood sugar will give your doctor good information about how well your medications are working Continue to follow heart healthy, low salt, low cholesterol, carbohydrate-modified, low sugar diet You have a telephone appointment with the pharmacist at Dr. Yetta Barre' office on Tuesday, December 06, 2021-- the pharmacist will call you on the telephone for this appointment; please tell the pharmacist how you are taking your medications; have your medications ready to review when the pharmacist calls you Review enclosed educational material about medication management  Our next scheduled telephone follow up visit/ appointment with care management team member is scheduled on:  Wednesday, December 28, 2021 at 11:30 am- This is a PHONE CALL appointment  If you need to cancel or re-schedule our visit, please call [phone] and our care guide team will be happy to assist you.   I look forward to hearing about your progress.   Caryl Pina, RN, BSN, CCRN [facility] RN Care Coordination- [facility] [phone]: direct office  If you are experiencing a Mental Health or Behavioral Health Crisis or need someone to talk to, please  call the Suicide and Crisis Lifeline: 988 call the [place] National Suicide Prevention Lifeline: 605-874-5636 or TTY:  (805) 591-1568 TTY 708-841-3111) to talk to a trained counselor call 1-800-273-TALK (toll free, 24 hour hotline) go to [facility] [address], [place] [phone]) call 911   The patient verbalized understanding of instructions, educational materials, and care plan provided today and agreed to receive a mailed copy of patient instructions, educational materials, and care plan  Basics of Medicine Management Taking your medicines correctly is an important part of managing or preventing medical problems. Make sure you know what disease or condition your medicine is treating, and how and when to take it. If you do not take your medicine correctly, it may not work well and may cause unpleasant side effects, including serious health problems. What should I do when I am taking medicines?  Read all the labels and inserts that come with your medicines. Review the information often and with each refill. Talk with your pharmacist if you get a refill and notice a change in the size, color, or shape of your medicines. Know the potential side effects for each medicine that you take. Try to get all your medicines from the same pharmacy. The pharmacist will have all your information and will understand how your medicines will affect each other (interact). Always carry an updated list of your medicines with you. If there is an emergency, a first responder can quickly see what medicines you are taking. Tell your health care provider about all your medicines, including over-the-counter medicines, vitamins, and herbal or dietary supplements. Your health care provider will make sure that nothing will interact with any of your prescribed medicines. How can I take my medicines safely? Take medicines only as told  by your health care provider. Do not take more of your medicine than instructed. Do not take anyone else's medicines. Do not share your medicines with others. Do  not stop taking your medicines unless your health care provider tells you to do so. You may need to avoid alcohol or certain foods or liquids when taking certain medicines. Follow your health care provider's instructions. Do not split, cut, crush, or chew your medicines unless your health care provider tells you to do so. Tell your health care provider if you have trouble swallowing your medicines. For liquid medicine, use the dosing container that was provided. Household spoons are not accurate. How should I organize my medicines? Know your medicines Know what each of your medicines looks like. This includes size, color, and shape. Tell your health care provider if you are having trouble recognizing all the medicines that you are taking. If you cannot tell your medicines apart because they look similar, keep them in the original bottles. If you cannot read the labels on the bottles, tell your pharmacist to put your medicines in containers with large print. Review your medicines and your schedule with family members, a friend, or a caregiver. Use a pill organizer Use a tool to organize your medicine schedule. Tools include a weekly pillbox, a written chart, a notebook, or a calendar. Your tool should help you remember the following things about each medicine: The name of the medicine. The amount (dose) to take. The schedule. This is the day and time the medicine should be taken. The appearance. This includes color, shape, size, and stamp. How to take your medicines. This includes instructions to take them with food, without food, with fluids, or with other medicines. Create reminders for taking your medicines. Use sticky notes, or use alarms on your watch, mobile device, or phone calendar. You may choose to use a more advanced management system. These systems have storage, alarms, and visual and audio prompts. Some medicines can be taken on an "as-needed" basis. These may include medicines for  nausea, constipation, pain, cough and cold, allergies, and anxiety. If you take an as-needed medicine, write down the name and dose, as well as the date and time that you took it. How should I plan for travel? Take your pillbox, medicines, and organization system with you when traveling. Have your medicines refilled before you travel. This will ensure that you do not run out of your medicines while you are away from home. Always carry an updated list of your medicines with you. If there is an emergency, a first responder can quickly see what medicines you are taking. Do not pack your medicines in checked luggage in case your luggage is lost or delayed. Keep your medicines in your carry-on bag. If any of your medicines is considered a controlled substance, make sure you bring a letter from your health care provider with you. How should I store and discard my medicines? For safe storage: Store medicines in a cool, dry area away from light, or as directed by your health care provider. Do not store medicines in the bathroom. Heat and humidity will affect them. Do not store your medicines with other chemicals or with medicines for pets or other household members. Keep medicines away from children and pets. Do not leave them on counters or bedside tables. Store them in high cabinets or on high shelves. For safe disposal: Check expiration dates regularly. Do not take expired medicines. Discard medicines that are older than the expiration date.  Learn a safe way to dispose of your medicines. You may: Use a local government, hospital, or pharmacy medicine-take-back program. If you cannot return the medicine, check the label or package insert to see if the medicine should be thrown out in the garbage or flushed down the toilet. If you are not sure, ask your health care team. If it is safe to put the medicine in the trash, empty the medicine out of the container. Mix the medicine with cat litter, dirt, coffee  grounds, or another unwanted substance. Seal the mixture in a bag or container. Put it in the trash. What should I remember? Tell your health care provider if you: Experience side effects. Have new symptoms. Feel that your medicine is no longer working. Have other concerns about taking your medicines. Review your medicines regularly with your health care provider. Other medicines, diet, medical conditions, weight changes, and daily habits can all affect how medicines work. Ask if you need to continue taking each medicine, and discuss how well each one is working. Refill your medicines early to avoid running out of them. In case of an accidental overdose, call your local poison control center at 939-761-0851 or go to your local emergency department right away. Summary Taking your medicines correctly is an important part of managing or preventing medical problems. You need to make sure that you understand what you are taking a medicine for, as well as how and when you need to take it. Use a tool to organize your medicine schedule. Tools include a weekly pillbox, a written chart, a notebook, or a calendar. In case of an accidental overdose, call your local Poison Control Center at 343-101-9674 or go to your local emergency department right away. This information is not intended to replace advice given to you by your health care provider. Make sure you discuss any questions you have with your health care provider. Document Revised: 05/10/2021 Document Reviewed: 05/10/2021 Elsevier Patient Education  2022 [employer].

## 2021-11-30 NOTE — Chronic Care Management (AMB) (Addendum)
Chronic Care Management   CCM RN Visit Note  11/30/2021 Name: [name] MRN: [id number] DOB: [birth year]  Subjective: [name] is a 66 y.o. year old male who is a primary care patient of [name], MD. The care management team was consulted for assistance with disease management and care coordination needs.    Engaged with patient by telephone for follow up visit in response to provider referral for case management and/or care coordination services.   Consent to Services:  The patient was given information about Chronic Care Management services, agreed to services, and gave verbal consent prior to initiation of services.  Please see initial visit note for detailed documentation.  Patient agreed to services and verbal consent obtained.   Assessment: Review of patient past medical history, allergies, medications, health status, including review of consultants reports, laboratory and other test data, was performed as part of comprehensive evaluation and provision of chronic care management services.   CCM Care Plan  Allergies  Allergen Reactions   Latex    Morphine    Statins     REACTION: weakness, muscle pains   Outpatient Encounter Medications as of 11/30/2021  Medication Sig Note   aspirin 81 MG EC tablet Take 81 mg by mouth daily.    Blood Glucose Monitoring Suppl (ONETOUCH VERIO) w/Device KIT 1 Act by Does not apply route in the morning and at bedtime.    [employer] D3 50 MCG (2000 UT) CAPS TAKE 2 CAPSULES (4,000 UNITS TOTAL) BY MOUTH DAILY.    ezetimibe (ZETIA) 10 MG tablet TAKE 1 TABLET BY MOUTH EVERY DAY    famotidine (PEPCID) 40 MG tablet TAKE 1 TABLET BY MOUTH EVERY DAY    fexofenadine (ALLEGRA) 180 MG tablet Take 180 mg by mouth daily as needed for allergies.    glucose blood (ONETOUCH VERIO) test strip 1 each by Other route in the morning and at bedtime. Use as instructed    Insulin Pen Needle 32G X 6 MM MISC 1 Act by Does not apply route daily.    KLOR-CON M20 20  MEQ tablet TAKE 1 TABLET BY MOUTH TWICE A DAY    Lancets (ONETOUCH ULTRASOFT) lancets     metFORMIN (GLUCOPHAGE) 1000 MG tablet Take 1 tablet (1,000 mg total) by mouth 2 (two) times daily with a meal.    NON FORMULARY Oxygen continuous 1.5 liters with activity    olmesartan (BENICAR) 40 MG tablet TAKE 1 TABLET BY MOUTH EVERY DAY    ONETOUCH VERIO test strip USE 1 PER DAY    Semaglutide (RYBELSUS) 3 MG TABS Take 3 mg by mouth daily. 11/16/2021: 11/16/21- reports is not taking yet; has obtained from outpatient pharmacy, but reports is waiting to start taking until he finishes all of his previously prescribed Soliqua insulin-- which he reports only taking "once or twice a week"   Tiotropium Bromide-Olodaterol (STIOLTO RESPIMAT) 2.5-2.5 MCG/ACT AERS Inhale 2 puffs into the lungs daily. 11/30/2021: 11/30/21- Reports using as needed; today, reports does not use rescue inhaler   No facility-administered encounter medications on file as of 11/30/2021.   Patient Active Problem List   Diagnosis Date Noted   Statin myopathy 04/25/2021   Encounter for general adult medical examination with abnormal findings 10/26/2020   Vitamin D deficiency disease 06/12/2018   GERD with esophagitis 12/12/2017   Thoracic aortic aneurysm without rupture 12/12/2017   Screen for colon cancer 01/12/2016   Routine general medical examination at a health care facility 07/16/2015   Benign  prostatic hyperplasia 07/13/2014   Type II diabetes mellitus with manifestations ([place]) 11/12/2013   Pure hyperglyceridemia 07/09/2013   Upper airway cough syndrome 02/06/2012   Hyperlipidemia with target LDL less than 130 07/05/2011   EMPHYSEMA 12/07/2008   Essential hypertension, benign 08/26/2008   INTERSTITIAL LUNG DISEASE 08/26/2008   Respiratory failure with hypoxia ([place]) 08/26/2008   Conditions to be addressed/monitored:  COPD and DMII  Care Plan : RN Care Manager Plan of Care  Updates made by Knox Royalty, RN since 11/30/2021 12:00  AM     Problem: Chronic Disease Management Needs   Priority: High     Long-Range Goal: Improvement in adherence to established plan of care for long term chronic disease management   Start Date: 11/16/2021  Expected End Date: 11/16/2022  Priority: High  Note:   Current Barriers:  Chronic Disease Management support and education needs related to COPD and DMII  RNCM Clinical Goal(s):  Patient will demonstrate ongoing health management independence as evidenced by adherence to plan of care for DMII; COPD        through collaboration with RN Care manager, provider, and care team.  Medication adherence issues: CCM Pharmacy team active  Interventions: 1:1 collaboration with primary care provider regarding development and update of comprehensive plan of care as evidenced by provider attestation and co-signature Inter-disciplinary care team collaboration (see longitudinal plan of care) Evaluation of current treatment plan related to  self management and patient's adherence to plan as established by provider Initial Assessment completed 11/16/21 Medications again discussed; confirmed CCM Pharmacy team active; made patient aware that he has a scheduled telephone appointment with the CCM pharmacist on Tuesday, 12/06/21 at 2:00 pm-- encouraged patient to have his medications out and ready for review, and to discuss how is currently managing his medications at home with the pharmacist Re-confirmed today patient has obtained Rybelsus but has still not yet started taking-- again reports he has "a lot" of (previously prescribed) Soliqua insulin that he "paid a lot of money for;" states he is going to use all of his insulin before he starts taking newly prescribed Rybelsus; I attempted to explain that he should take BOTH, in addition to the prescribed metformin, but he states, "I am NOT taking 3 different medications every day for my blood sugar;" encouraged him to share all of his thoughts about his medication  management with CCM pharmacist at time of scheduled call next week Reports since our conversation on 11/16/21, he has started taking previously prescribed Soliqua QD, "between 10 and 20 units"-- he had reported 11/16/21 taking "once or twice a week;" he again states "decides how much to use based on daily blood sugar values" at home Confirmed patient continues taking metformin as prescribed-- but today, he tells me that he "sometimes cut the dose in half;" patient again reports he "just decides how mcuh to take" based on daily blood sugars at home Re- confirmed he has stopped taking HCTZ, as advised 10/26/21; he reported "a little bit of ankle swelling" 11/16/21 since stopping, and confirms this swelling has persisted, but he states today, "it's not that bad," and he tells me it has not impacted his ability to ambulate At time of 11/16/21 outreach, he reported having/ using a rescue inhaler-- today he denies "ever" having used a rescue inhaler Today, he informs me that he is NOT using stiolto maintenance inhaler QD: he tells me he uses "only if I need to;" attempted to explain how a maintenance inhaler is supposed to  be used QD-- however, patient states, "I am taking something I don't need" Will update CCM Pharmacy team on today's medication assessment and patient's feedback on how is managing medications: he remains very concerned about "going back into the donut hole" Re-confirmed patient independently self-manages medications  COPD: (Status: 11/30/21: Goal on Track (progressing): YES.) Long Term Goal   Advised patient to track and manage COPD triggers Advised patient to self assesses COPD action plan zone and make appointment with provider if in the yellow zone for 48 hours without improvement Discussed the importance of adequate rest and management of fatigue with COPD Discussed patient's ongoing use of home oxygen: reports he takes portable oxygen out with him when he goes outside of home; has back-pack  that he carries tank in; reports otherwise, he uses only prn at home, several times each week, but does not use daily; uses home O2 between 1-3 L/min, depending on degree of shortness of breath; reports no concerns about home O2 delivery system Confirmed patient had one episode of shortness of breath since our last outreach, "while outside picking up sticks and branches out of the yard;" confirms that this episode was resolved by using his maintenance inhaler as a rescue, rest, and use of home oxygen- reports "only took about 10 minutes and I was breathing better;"  Previously provided education around action plan for episodes shortness of breath reinforced: patient will require ongoing reinforcement and support around same Discussed his current action plan for COPD flares: patient has good understanding of same; starts with rest/ cessation of activity, use of maintenance inhaler as a rescue inhaler, home O2 use  Diabetes:  (Status: 11/30/21: Goal on Track (progressing): YES.) Long Term Goal   Lab Results  Component Value Date   HGBA1C 7.6 (H) 10/26/2021    Reviewed prescribed diet with patient low sugar/ carbohydrate modified; heart healthy ; Discussed plans with patient for ongoing care management follow up and provided patient with direct contact information for care management team;      Reviewed scheduled/upcoming provider appointments including: 02/15/22- CCM pharmacy; 04/26/22- PCP office visit;         Review of patient status, including review of consultants reports, relevant laboratory and other test results, and medications completed;       Confirmed patient has continued monitoring/ recording blood sugars at home fasting and then "right before I eat;" we had discussed 11/16/21 optimal timing of blood sugar monitoring to include post-prandial values-- however, he does not wish to monitor this way-- he prefers monitoring fasting and immediate pre-prandial values because "that's how I have always  done it" Again discussed/ provided education around optimal times to monitor/ record blood sugars; again encouraged patient to consider starting to monitor blood sugars 2 hours after eating regular size meal-- explained that post-prandial readings provide good general information about how well prescribed medications are working Reviewed recently obtained blood sugars at home: reports fasting blood sugars consistently between 150-190-- he states today, "since I started taking the insulin every day, my blood sugars have increased, they are running closer to 180 in the mornings before I eat;" he does not wish to review all of his blood sugars at home today, he provided basic ranges of blood sugar values; reports immediate pre-prandial blood sugars as "130-160" Confirmed no recent low blood sugar values < 70: he reports one isolated episode where he felt "shaky and queasy" and his blood sugar was "84;" resolved by eating piece of candy; reinforced previously provided education around  self-management action plan for hypoglycemia  Patient Goals/Self-Care Activities: As evidenced by review of EHR, collaboration with care team, and patient reporting during CCM RN CM outreach,  Patient Lurena Joiner will: Take medications as prescribed Attend all scheduled provider appointments Call pharmacy for medication refills Call provider office for new concerns or questions Continue to check fasting (first thing in the morning, before eating) blood sugars at home; it is a good idea to check your blood sugars again 2 hours after eating a regular sized meal-- this blood sugar will give your doctor good information about how well your medications are working Continue to follow heart healthy, low salt, low cholesterol, carbohydrate-modified, low sugar diet You have a telephone appointment with the pharmacist at Dr. Ronnald Ramp' office on Tuesday, December 06, 2021-- the pharmacist will call you on the telephone for this appointment;  please tell the pharmacist how you are taking your medications; have your medications ready to review when the pharmacist calls you Review enclosed educational material about medication management      Plan: Telephone follow up appointment with care management team member scheduled for: Wednesday, December 28, 2021 at 11:30 am The patient has been provided with contact information for the care management team and has been advised to call with any health related questions or concerns  Oneta Rack, RN, BSN, [place] [phone]: direct office

## 2021-12-06 ENCOUNTER — Ambulatory Visit: Payer: Medicare Other

## 2021-12-06 DIAGNOSIS — I10 Essential (primary) hypertension: Secondary | ICD-10-CM

## 2021-12-06 DIAGNOSIS — E118 Type 2 diabetes mellitus with unspecified complications: Secondary | ICD-10-CM

## 2021-12-06 DIAGNOSIS — E785 Hyperlipidemia, unspecified: Secondary | ICD-10-CM

## 2021-12-06 NOTE — Patient Instructions (Signed)
Visit Information  Following are the goals we discussed today:   Track and [place] My Blood Sugar   Timeframe:  Long-Range Goal Priority:  High Start Date:   12/06/2021                         Expected End Date:  12/06/2022                     Follow Up Date 02/02/2022   - check blood sugar at prescribed times - check blood sugar before and after exercise - check blood sugar if I feel it is too high or too low - take the blood sugar log to all doctor visits    Why is this important?   Checking your blood sugar at home helps to keep it from getting very high or very low.  Writing the results in a diary or log helps the doctor know how to care for you.  Your blood sugar log should have the time, date and the results.  Also, write down the amount of insulin or other medicine that you take.  Other information, like what you ate, exercise done and how you were feeling, will also be helpful.    Plan: Telephone follow up appointment with care management team member scheduled for:  2 months  The patient has been provided with contact information for the care management team and has been advised to call with any health related questions or concerns.   Ellin Saba, PharmD Clinical Pharmacist, [facility]   Please call the care guide team at [phone] if you need to cancel or reschedule your appointment.   The patient verbalized understanding of instructions, educational materials, and care plan provided today and declined offer to receive copy of patient instructions, educational materials, and care plan.

## 2021-12-06 NOTE — Progress Notes (Signed)
Chronic Care Management Pharmacy Note  12/06/2021 Name:  [name] MRN:  [id number] DOB:  [birth year]  Summary: -Patient reports that he has not yet started rybelsus due to having leftover soliqua that he wanted to finish before starting rybelsus because of how much he spent purchasing soliqua -Patient reports to using soliqua as needed (uses 20 units at a time) - checks blood sugars twice daily - recent 7 day average 142, 90 day average of 163 -Unsure of BP control as he does not record BP at home, stopped hctz, denies any issues since stopping  -Holding famotidine at this time to see if it was necessary for him to take - denies issues since stopping   Recommendations/Changes made from today's visit: -discussed with patient concern about using soliqua prn - likely to lead to hypoglycemic episodes - reviewed with patient reasons why rybelsus would be more appropriate for him at this time -Patient not agreeable to starting rybelsus, but was agreeable to holding soliqua - will work on diet and lifestyle changes to better control BG - follow up in 2 months to discuss BG logs - will plan for rybelsus PAP if started  -Patient to monitor BP at home at least once weekly, record in log to discuss with next appointment    Subjective: [name] is an 66 y.o. year old male who is a primary patient of [name], MD.  The CCM team was consulted for assistance with disease management and care coordination needs.    Engaged with patient by telephone for follow up visit in response to provider referral for pharmacy case management and/or care coordination services.   Consent to Services:  The patient was given information about Chronic Care Management services, agreed to services, and gave verbal consent prior to initiation of services.  Please see initial visit note for detailed documentation.   Patient Care Team: [name], MD as PCP - General Chesley Mires, MD as Consulting  Physician (Pulmonary Disease) End, Harrell Gave, MD as Consulting Physician (Cardiology) Knox Royalty, RN as Case Manager Margerie Fraiser, Darnelle Maffucci, [facility] (Pharmacist)  Recent office visits: 10/26/2021 - Dr. Ronnald Ramp - stop thiazide - start rybelsus 77m daily   Recent consult visits: None since last visit   Hospital visits: None in previous 6 months   Objective:  Lab Results  Component Value Date   CREATININE 1.02 10/26/2021   BUN 17 10/26/2021   GFR 75.87 10/26/2021   GFRNONAA >60 08/19/2008   GFRAA  08/19/2008    >60        The eGFR has been calculated using the MDRD equation. This calculation has not been validated in all clinical   NA 134 (L) 10/26/2021   K 4.5 10/26/2021   CALCIUM 10.0 10/26/2021   CO2 25 10/26/2021   GLUCOSE 164 (H) 10/26/2021    Lab Results  Component Value Date/Time   HGBA1C 7.6 (H) 10/26/2021 11:51 AM   HGBA1C 7.2 (H) 04/08/2021 03:00 PM   GFR 75.87 10/26/2021 11:51 AM   GFR 71.12 04/08/2021 03:00 PM   MICROALBUR <0.7 10/26/2021 11:51 AM   MICROALBUR <0.7 10/26/2020 11:58 AM    Last diabetic Eye exam:  Lab Results  Component Value Date/Time   HMDIABEYEEXA No Retinopathy 07/20/2021 12:00 AM    Last diabetic Foot exam:  Lab Results  Component Value Date/Time   HMDIABFOOTEX done 11/23/2010 12:00 AM     Lab Results  Component Value Date   CHOL 205 (H) 10/26/2021  HDL 41.50 10/26/2021   LDLCALC 35 01/03/2017   LDLDIRECT 138.0 10/26/2021   TRIG 248.0 (H) 10/26/2021   CHOLHDL 5 10/26/2021    Hepatic Function Latest Ref Rng & Units 10/26/2021 10/26/2020 06/24/2019  Total Protein 6.0 - 8.3 g/dL 6.5 6.6 6.7  Albumin 3.5 - 5.2 g/dL 4.4 4.6 4.5  AST 0 - 37 U/L 23 25 17   ALT 0 - 53 U/L 38 52 30  Alk Phosphatase 39 - 117 U/L 87 82 81  Total Bilirubin 0.2 - 1.2 mg/dL 0.4 0.5 0.4  Bilirubin, Direct 0.0 - 0.3 mg/dL 0.1 0.1 0.1    Lab Results  Component Value Date/Time   TSH 3.24 10/26/2021 11:51 AM   TSH 2.62 10/26/2020 11:58 AM    CBC Latest  Ref Rng & Units 10/26/2021 10/26/2020 06/24/2019  WBC 4.0 - 10.5 K/uL 8.6 8.8 9.4  Hemoglobin 13.0 - 17.0 g/dL 14.2 14.9 14.7  Hematocrit 39.0 - 52.0 % 41.6 43.3 42.5  Platelets 150.0 - 400.0 K/uL 244.0 260.0 282.0    Lab Results  Component Value Date/Time   VD25OH 34.31 06/24/2019 10:24 AM   VD25OH 22.23 (L) 06/12/2018 01:58 PM    Clinical ASCVD: No  The 10-year ASCVD risk score (Arnett DK, et al., 2019) is: 37.4%   Values used to calculate the score:     Age: 66 years     Sex: Male     Is Non-Hispanic African American: No     Diabetic: Yes     Tobacco smoker: Yes     Systolic Blood Pressure: 110 mmHg     Is BP treated: Yes     HDL Cholesterol: 41.5 mg/dL     Total Cholesterol: 205 mg/dL    Depression screen [facility] 2/9 11/16/2021 08/23/2021 07/28/2021  Decreased Interest 0 1 0  Down, Depressed, Hopeless 0 0 0  PHQ - 2 Score 0 1 0  Altered sleeping - - -  Tired, decreased energy - - -  Change in appetite - - -  Feeling bad or failure about yourself  - - -  Trouble concentrating - - -  Moving slowly or fidgety/restless - - -  Suicidal thoughts - - -  PHQ-9 Score - - -  Difficult doing work/chores - - -     Social History   Tobacco Use  Smoking Status Former   Packs/day: 1.00   Years: 16.00   Pack years: 16.00   Types: Cigarettes   Quit date: 10/17/2003   Years since quitting: 18.1  Smokeless Tobacco Never   BP Readings from Last 3 Encounters:  10/26/21 118/76  07/28/21 122/80  04/25/21 120/86   Pulse Readings from Last 3 Encounters:  10/26/21 98  07/28/21 89  04/25/21 93   Wt Readings from Last 3 Encounters:  10/26/21 220 lb (99.8 kg)  07/28/21 219 lb 9.6 oz (99.6 kg)  04/25/21 219 lb 9.6 oz (99.6 kg)   BMI Readings from Last 3 Encounters:  10/26/21 34.46 kg/m  07/28/21 34.39 kg/m  04/25/21 34.39 kg/m    Assessment/Interventions: Review of patient past medical history, allergies, medications, health status, including review of consultants reports,  laboratory and other test data, was performed as part of comprehensive evaluation and provision of chronic care management services.   SDOH:  (Social Determinants of Health) assessments and interventions performed: Yes  SDOH Screenings   Alcohol Screen: Low Risk    Last Alcohol Screening Score (AUDIT): 0  Depression (PHQ2-9): Low Risk    PHQ-2 Score:  0  Financial Resource Strain: Low Risk    Difficulty of Paying Living Expenses: Not very hard  Food Insecurity: No Food Insecurity   Worried About Charity fundraiser in the Last Year: Never true   Ran Out of Food in the Last Year: Never true  Housing: Low Risk    Last Housing Risk Score: 0  Physical Activity: Not on file  Social Connections: Moderately Isolated   Frequency of Communication with Friends and Family: Three times a week   Frequency of Social Gatherings with Friends and Family: Once a week   Attends Religious Services: 1 to 4 times per year   Active Member of [employer] or Organizations: No   Attends Archivist Meetings: Never   Marital Status: Divorced  Stress: No Stress Concern Present   Feeling of Stress : Only a little  Tobacco Use: Medium Risk   Smoking Tobacco Use: Former   Smokeless Tobacco Use: Never   Passive Exposure: Not on file  Transportation Needs: No Transportation Needs   Lack of Transportation (Medical): No   Lack of Transportation (Non-Medical): No    CCM Care Plan  Allergies  Allergen Reactions   Latex    Morphine    Statins     REACTION: weakness, muscle pains    Medications Reviewed Today     Reviewed by Tomasa Blase, [facility] (Pharmacist) on 12/06/21 at [place] List Status: <None>   Medication Order Taking? Sig Documenting Provider Last Dose Status Informant  aspirin 81 MG EC tablet [id number] Yes Take 81 mg by mouth daily. [provider] Taking Active   Blood Glucose Monitoring Suppl (ONETOUCH VERIO) w/Device KIT [id number] Yes 1 Act by Does not apply route in the  morning and at bedtime. [name], MD Taking Active   [facility] D3 50 MCG [phone] UT) CAPS [id number] Yes TAKE 2 CAPSULES (4,000 UNITS TOTAL) BY MOUTH DAILY. [name], MD Taking Active   ezetimibe (ZETIA) 10 MG tablet [id number] Yes TAKE 1 TABLET BY MOUTH EVERY DAY [name], MD Taking Active   famotidine (PEPCID) 40 MG tablet [id number] No TAKE 1 TABLET BY MOUTH EVERY DAY  Patient not taking: Reported on 12/06/2021   [name], MD Not Taking Active   fexofenadine (ALLEGRA) 180 MG tablet [id number] Yes Take 180 mg by mouth daily as needed for allergies. [provider] Taking Active   glucose blood (ONETOUCH VERIO) test strip [id number]  1 each by Other route in the morning and at bedtime. Use as instructed [name], MD  Active   Insulin Pen Needle 32G X 6 MM MISC [id number]  1 Act by Does not apply route daily. [name], MD  Active   KLOR-CON M20 20 MEQ tablet [id number] Yes TAKE 1 TABLET BY MOUTH TWICE A DAY [name], MD Taking Active   Lancets [facility] ULTRASOFT) lancets [id number]   [provider]  Active   metFORMIN (GLUCOPHAGE) 1000 MG tablet [id number] Yes Take 1 tablet (1,000 mg total) by mouth 2 (two) times daily with a meal. [name], MD Taking Active   NON FORMULARY [id number] Yes Oxygen continuous 1.5 liters with activity [provider] Taking Active   olmesartan (BENICAR) 40 MG tablet [id number] Yes TAKE 1 TABLET BY MOUTH EVERY DAY [name], MD Taking Active   [facility] VERIO test strip [id number]  USE 1 PER DAY Renato Shin, MD  Active   Semaglutide (RYBELSUS) 3 MG TABS  [id number] No Take 3 mg by mouth daily.  Patient not taking: Reported on 12/06/2021   [name], MD Not Taking Active            Med Note Marzetta Merino Nov 16, 2021  3:30 PM) 11/16/21- reports is not taking yet; has obtained from outpatient pharmacy, but reports is waiting to start taking until he finishes all of his previously prescribed  Soliqua insulin-- which he reports only taking "once or twice a week"  Tiotropium Bromide-Olodaterol (STIOLTO RESPIMAT) 2.5-2.5 MCG/ACT AERS [id number] Yes Inhale 2 puffs into the lungs daily. [name], MD Taking Active            Med Note Marzetta Merino Nov 30, 2021 11:45 AM) 11/30/21- Reports using as needed; today, reports does not use rescue inhaler            Patient Active Problem List   Diagnosis Date Noted   Statin myopathy 04/25/2021   Encounter for general adult medical examination with abnormal findings 10/26/2020   Vitamin D deficiency disease 06/12/2018   GERD with esophagitis 12/12/2017   Thoracic aortic aneurysm without rupture 12/12/2017   Screen for colon cancer 01/12/2016   Routine general medical examination at a health care facility 07/16/2015   Benign prostatic hyperplasia 07/13/2014   Type II diabetes mellitus with manifestations ([place]) 11/12/2013   Pure hyperglyceridemia 07/09/2013   Upper airway cough syndrome 02/06/2012   Hyperlipidemia with target LDL less than 130 07/05/2011   EMPHYSEMA 12/07/2008   Essential hypertension, benign 08/26/2008   INTERSTITIAL LUNG DISEASE 08/26/2008   Respiratory failure with hypoxia ([place]) 08/26/2008    Immunization History  Administered Date(s) Administered   Fluad Quad(high Dose 65+) 06/24/2019, 07/28/2021   Influenza Split 07/05/2011, 07/16/2012   Influenza Whole 07/21/2009, 08/22/2010   Influenza,inj,Quad PF,6+ Mos 06/25/2013, 07/13/2014, 07/15/2015, 08/08/2016, 06/11/2017, 06/12/2018   Influenza-Unspecified 07/16/2020   Moderna Sars-Covid-2 Vaccination 12/16/2019, 01/11/2020, 03/27/2021   Pneumococcal Conjugate-13 06/25/2013   Pneumococcal Polysaccharide-23 08/17/2008, 08/08/2016   Td 10/16/2005   Tdap 01/12/2016   Zoster Recombinat (Shingrix) 04/28/2021, 07/05/2021   Zoster, Live 05/08/2016    Conditions to be addressed/monitored:  Hypertension, Hyperlipidemia, and Diabetes, Lung disease  Care  Plan : [place]  Updates made by Tomasa Blase, RPH since 12/06/2021 12:00 AM     Problem: Hypertension, Hyperlipidemia, and Diabetes, Lung disease   Priority: High     Long-Range Goal: Disease management   Start Date: 04/06/2021  Expected End Date: 12/06/2022  Recent Progress: On track  Priority: High  Note:   Current Barriers:  Unable to independently monitor therapeutic efficacy  Pharmacist Clinical Goal(s):  Patient will achieve adherence to monitoring guidelines and medication adherence to achieve therapeutic efficacy  Interventions: 1:1 collaboration with [name], MD regarding development and update of comprehensive plan of care as evidenced by provider attestation and co-signature Inter-disciplinary care team collaboration (see longitudinal plan of care) Comprehensive medication review performed; medication list updated in electronic medical record  Hypertension  Controlled  BP goal is:  <130/80 BP Readings from Last 3 Encounters:  10/26/21 118/76  07/28/21 122/80  04/25/21 120/86  Patient checks BP at home several times per month Patient home BP readings are ranging: <130/80   Patient has failed these meds in the past: hctz Patient is currently controlled on the following medications: Olmesartan 40 mg daily   We discussed: pt endorsed compliance and denies issues;  Plan: Continue  current medications and control with diet and exercise    Hyperlipidemia    LDL goal 30% reduction (<130) Statin intolerant Last LDL: 141m/dL   Patient has failed these meds in past: Praluent, Nexlizet, Repatha ($$), rosuvastatin TIW, atorvastatin Patient is currently uncontrolled on the following medications: Ezetimibe 10 mg daily Aspirin 81 mg daily OTC Krill Oil   We discussed:  pt has not tolerated statins or PCSK9 previously; pt endorses adherence and denies side effects   Plan: Continue control with diet and exercise    Diabetes   Not ideally  controlled A1c goal <7% Lab Results  Component Value Date   HGBA1C 7.6 (H) 10/26/2021  Checking BG: 2x daily Recent FBG Readings: 150, 129-78 afternoon   Patient has failed these meds in past: soliqua Patient is currently controlled on the following medications: Metformin 1000 mg BID Rybelsus 397mdaily - not currently taking  Test strips Using soliqua - 20 units as needed    We discussed: Pt reports BG 7 day average of 142, 14 day average of 154, and 90 day average of 163. He reports he does better with diet/exercise in the summer when he has his garden to woRite Aidhe reports in the winter he is more sedentary and does not eat as many fresh vegetables -Reviewed with patient concern about continued use of soliqua - likely to lead to hypoglycemic episodes, recommended for patient to hold soliqua to prevent this - patient was ultimately agreeable  -Recommended starting rybelsus 24m20maily - patient declined at this time as he wanted to try changing diet and increasing activity before starting a new medication - will follow up in 2 months to discuss BG and discuss rybelsus  -Patient would be eligible for rybelsus PAP if plan is to start   Plan: Recommend to continue current medication   Interstitial Lung Disease / Emphysema    Follows with Dr SooHalford Chessmanst spirometry score: 07/01/2018 -FEV1 119% predicted -FEV1/FVC 0.80 -FEV1 % change: 0%    Patient has failed these meds in past: n/a Patient is currently uncontrolled on the following medications: Stiolto Respimat 2 puffs daily (Dr SooHalford Chessman2 1.5 L w/ activity   Using maintenance inhaler regularly? Yes Frequency of rescue inhaler use:  never   We discussed:  Pt reports he has been taking Stiolto as needed - has taking daily in the past and did not notice a different in breathing    Plan: Continue current medications         Medication Assistance: None required.  Patient affirms current coverage meets needs.  Care  Gaps: Colonoscopy (due 02/14/17) COVID booster  PNA vaccine   [facility]:  [id number] [place] - [address] [id number]: [phone]: [phone] pill box? Yes Pt endorses 80-85% compliance  We discussed: Current pharmacy is preferred with insurance plan and patient is satisfied with pharmacy services Patient decided to: Continue current medication management strategy  Care Plan and Follow Up Patient Decision:  Patient agrees to Care Plan and Follow-up.  Plan: Telephone follow up appointment with care management team member scheduled for:  2 months  DaniTomasa BlasearmD Clinical Pharmacist, [place]

## 2021-12-13 DIAGNOSIS — E118 Type 2 diabetes mellitus with unspecified complications: Secondary | ICD-10-CM

## 2021-12-13 DIAGNOSIS — I10 Essential (primary) hypertension: Secondary | ICD-10-CM

## 2021-12-13 DIAGNOSIS — E785 Hyperlipidemia, unspecified: Secondary | ICD-10-CM | POA: Diagnosis not present

## 2021-12-13 DIAGNOSIS — J438 Other emphysema: Secondary | ICD-10-CM | POA: Diagnosis not present

## 2021-12-28 ENCOUNTER — Ambulatory Visit: Payer: Medicare Other

## 2021-12-28 ENCOUNTER — Ambulatory Visit (INDEPENDENT_AMBULATORY_CARE_PROVIDER_SITE_OTHER): Payer: Medicare Other | Admitting: *Deleted

## 2021-12-28 DIAGNOSIS — E118 Type 2 diabetes mellitus with unspecified complications: Secondary | ICD-10-CM

## 2021-12-28 DIAGNOSIS — I10 Essential (primary) hypertension: Secondary | ICD-10-CM

## 2021-12-28 NOTE — Chronic Care Management (AMB) (Signed)
?Chronic Care Management  ? ?CCM RN Visit Note ? ?12/28/2021 ?Name: [name] MRN: [id number] DOB: September 05, 1956 ? ?Subjective: ?[name] is a 66 y.o. year old male who is a primary care patient of [name], MD. The care management team was consulted for assistance with disease management and care coordination needs.   ? ?Engaged with patient by telephone for follow up visit in response to provider referral for case management and/or care coordination services.  ? ?Consent to Services:  ?The patient was given information about Chronic Care Management services, agreed to services, and gave verbal consent prior to initiation of services.  Please see initial visit note for detailed documentation.  ?Patient agreed to services and verbal consent obtained.  ? ?Assessment: Review of patient past medical history, allergies, medications, health status, including review of consultants reports, laboratory and other test data, was performed as part of comprehensive evaluation and provision of chronic care management services.  ? ?SDOH (Social Determinants of Health) assessments and interventions performed:  ?SDOH Interventions   ? ?Flowsheet Row Most Recent Value  ?SDOH Interventions   ?Food Insecurity Interventions Intervention Not Indicated  [Continues to deny food insecurity]  ?Transportation Interventions Intervention Not Indicated  [Reports continues to drive self]  ? ?  ? ?CCM Care Plan ? ?Allergies  ?Allergen Reactions  ? Latex   ? Morphine   ? Statins   ?  REACTION: weakness, muscle pains  ? ?Outpatient Encounter Medications as of 12/28/2021  ?Medication Sig Note  ? aspirin 81 MG EC tablet Take 81 mg by mouth daily.   ? Blood Glucose Monitoring Suppl (ONETOUCH VERIO) w/Device KIT 1 Act by Does not apply route in the morning and at bedtime.   ? [facility] D3 50 MCG (2000 UT) CAPS TAKE 2 CAPSULES (4,000 UNITS TOTAL) BY MOUTH DAILY.   ? ezetimibe (ZETIA) 10 MG tablet TAKE 1 TABLET BY MOUTH EVERY DAY   ? famotidine  (PEPCID) 40 MG tablet TAKE 1 TABLET BY MOUTH EVERY DAY (Patient not taking: Reported on 12/06/2021)   ? fexofenadine (ALLEGRA) 180 MG tablet Take 180 mg by mouth daily as needed for allergies.   ? glucose blood (ONETOUCH VERIO) test strip 1 each by Other route in the morning and at bedtime. Use as instructed   ? Insulin Pen Needle 32G X 6 MM MISC 1 Act by Does not apply route daily.   ? KLOR-CON M20 20 MEQ tablet TAKE 1 TABLET BY MOUTH TWICE A DAY   ? Lancets (ONETOUCH ULTRASOFT) lancets    ? metFORMIN (GLUCOPHAGE) 1000 MG tablet Take 1 tablet (1,000 mg total) by mouth 2 (two) times daily with a meal.   ? NON FORMULARY Oxygen continuous 1.5 liters with activity   ? olmesartan (BENICAR) 40 MG tablet TAKE 1 TABLET BY MOUTH EVERY DAY   ? ONETOUCH VERIO test strip USE 1 PER DAY   ? Semaglutide (RYBELSUS) 3 MG TABS Take 3 mg by mouth daily. (Patient not taking: Reported on 12/06/2021) 11/16/2021: 11/16/21- reports is not taking yet; has obtained from outpatient pharmacy, but reports is waiting to start taking until he finishes all of his previously prescribed Soliqua insulin-- which he reports only taking "once or twice a week"  ? Tiotropium Bromide-Olodaterol (STIOLTO RESPIMAT) 2.5-2.5 MCG/ACT AERS Inhale 2 puffs into the lungs daily. 11/30/2021: 11/30/21- Reports using as needed; today, reports does not use rescue inhaler  ? ?No facility-administered encounter medications on file as of 12/28/2021.  ? ?Patient Active Problem  List  ? Diagnosis Date Noted  ? Statin myopathy 04/25/2021  ? Encounter for general adult medical examination with abnormal findings 10/26/2020  ? Vitamin D deficiency disease 06/12/2018  ? GERD with esophagitis 12/12/2017  ? Thoracic aortic aneurysm without rupture 12/12/2017  ? Screen for colon cancer 01/12/2016  ? Routine general medical examination at a health care facility 07/16/2015  ? Benign prostatic hyperplasia 07/13/2014  ? Type II diabetes mellitus with manifestations ([place]) 11/12/2013  ? Pure  hyperglyceridemia 07/09/2013  ? Upper airway cough syndrome 02/06/2012  ? Hyperlipidemia with target LDL less than 130 07/05/2011  ? EMPHYSEMA 12/07/2008  ? Essential hypertension, benign 08/26/2008  ? INTERSTITIAL LUNG DISEASE 08/26/2008  ? Respiratory failure with hypoxia ([place]) 08/26/2008  ? ?Conditions to be addressed/monitored:  HTN and DMII ? ?Care Plan : RN Care Manager Plan of Care  ?Updates made by Knox Royalty, RN since 12/28/2021 12:00 AM  ?  ? ?Problem: Chronic Disease Management Needs   ?Priority: High  ?  ? ?Long-Range Goal: Improvement in adherence to established plan of care for long term chronic disease management   ?Start Date: 11/16/2021  ?Expected End Date: 11/16/2022  ?Priority: High  ?Note:   ?Current Barriers:  ?Chronic Disease Management support and education needs related to COPD and DMII ?12/28/21: Ongoing non-adherence in following healthcare provider advice and recommendations: primarily with medications and diet ? ?RNCM Clinical Goal(s):  ?Patient will demonstrate ongoing health management independence as evidenced by adherence to plan of care for DMII; COPD        through collaboration with RN Care manager, provider, and care team.  ?Medication adherence issues: CCM Pharmacy team active ? ?Interventions: ?1:1 collaboration with primary care provider regarding development and update of comprehensive plan of care as evidenced by provider attestation and co-signature ?Inter-disciplinary care team collaboration (see longitudinal plan of care) ?Evaluation of current treatment plan related to  self management and patient's adherence to plan as established by provider ?Initial Assessment completed 11/16/21 ?12/28/21: ?Review of patient status, including review of consultants reports, relevant laboratory and other test results, and medications completed;       ?Discussed current clinical condition- he immediately tells me that both his blood pressures and blood sugars have been "very high;" he denies  specific clinical complaints, tells me he "feels bout the same as I always do;" reports one episode "awhile back" when doing yard work that "my oxygen level and my heart rate dropped;" tells me this spontaneously resolved with rest; he denies headache/ pain/ swelling outside of baseline/ palpitations/ fatigue outside baseline ?Reports recent blood blood pressures at home "very high:" reports consistent ranges between 130-165/100-120; reports one lower value over last 2 weeks as: "115/87" ?Not sure of accuracy of blood pressure monitor at home-- however, states "I am pretty sure it is accurate;" has not validated these values by re-checking at outpatient pharmacy, against another BP monitor, etc ?Confirmed he has not addressed these high blood pressures in any way: he denies specific signs/ symptoms HTN crisis today and sounds to be in no distress throughout our phone call ?Encouraged patient to consider going to [facility]/ ED to have blood pressures evaluated- he states he "might" ?Discussed need to schedule sooner PCP office visit if he does not chose to go to ED/ [facility]- if his blood pressures remain this elevated; explained that Dr. Ronnald Ramp may want to adjust/ change his blood pressure medications-- again states he "might;" encouraged him to take blood pressure readings from home with him  of does chose to seek medical care for these elevated blood pressures he is experiencing using his automatic home monitor ?Provided education around risks of high blood pressures, signs/ symptoms CVA/ TIA, along with corresponding action plan ?Pain assessment updated: continues to deny acute/ chronic pain ?Falls assessment updated: continues to deny new/ recent falls x 12 months; does not use assistive devices; positive reinforcement provided with encouragement to continue efforts; previously provided education around fall risks/ prevention reinforced ?SDOH updated: no new/ unmet concerns identified ?Medications: reviewed recent CCM  pharmacy visit 12/06/21 with patient-- reinforced recommendations made by RPh at that time ?Reports continues taking soliqua insulin- 20 U QD, despite recommendation of RPh to stop taking: states "blood sugars sta

## 2021-12-28 NOTE — Patient Instructions (Signed)
Visit Information ? ?[name], thank you for taking time to talk with me today. Please don't hesitate to contact me if I can be of assistance to you before our next scheduled telephone appointment ? ?Below are the goals we discussed today:  ?Patient Self-Care Activities: ?Patient [name] will: ?Take medications as prescribed ?Attend all scheduled provider appointments ?Call pharmacy for medication refills ?Call provider office for new concerns or questions ?Continue to check fasting (first thing in the morning, before eating) blood sugars at home; it is a good idea to check your blood sugars again 2 hours after eating a regular sized meal-- this blood sugar will give your doctor good information about how well your medications are working ?Continue to follow heart healthy, low salt, low cholesterol, carbohydrate-modified, low sugar diet ?You have a telephone appointment with the pharmacist at Dr. Yetta Barre' office on February 03, 2022-- the pharmacist will call you on the telephone for this appointment; please tell the pharmacist how you are taking your medications; have your medications ready to review when the pharmacist calls you ?Review enclosed educational material about high blood pressure- if you remain concerned about your high blood pressure readings at home, please go to the Emergency department or the Urgent Care Center for evaluation ?Consider making a sooner appointment with Dr. Yetta Barre if your blood pressures remain high ? ?Our next scheduled telephone follow up visit/ appointment is scheduled on:   Tuesday, Feb 14, 2022 at 9:45 am- This is a PHONE CALL appointment ? ?If you need to cancel or re-schedule our visit, please call [phone] and our care guide team will be happy to assist you. ?  ?I look forward to hearing about your progress. ?  ?Caryl Pina, RN, BSN, CCRN Alumnus ?CCM Clinic RN Care Coordination- [facility] ?([phone]: direct office ? ?If you are experiencing a Mental Health or  Behavioral Health Crisis or need someone to talk to, please  ?call the Suicide and Crisis Lifeline: 988 ?call the [place] National Suicide Prevention Lifeline: (916) 749-3547 or TTY: (506)678-4864 TTY 507-685-2081) to talk to a trained counselor ?call 1-800-273-TALK (toll free, 24 hour hotline) ?go to [facility] [address], [place] [phone]) ?call 911  ? ?The patient verbalized understanding of instructions, educational materials, and care plan provided today and agreed to receive a mailed copy of patient instructions, educational materials, and care plan ? ?Managing Your Hypertension ?Hypertension, also called high blood pressure, is when the force of the blood pressing against the walls of the arteries is too strong. Arteries are blood vessels that carry blood from your heart throughout your body. Hypertension forces the heart to work harder to pump blood and may cause the arteries to become narrow or stiff. ?Understanding blood pressure readings ?Your personal target blood pressure may vary depending on your medical conditions, your age, and other factors. A blood pressure reading includes a higher number over a lower number. Ideally, your blood pressure should be below 120/80. You should know that: ?The first, or top, number is called the systolic pressure. It is a measure of the pressure in your arteries as your heart beats. ?The second, or bottom number, is called the diastolic pressure. It is a measure of the pressure in your arteries as the heart relaxes. ?Blood pressure is classified into four stages. Based on your blood pressure reading, your health care provider may use the following stages to determine what type of treatment you need, if any. Systolic pressure and diastolic pressure  are measured in a unit called mmHg. ?Normal ?Systolic pressure: below 120. ?Diastolic pressure: below 80. ?Elevated ?Systolic pressure: 120-129. ?Diastolic pressure: below  80. ?Hypertension stage 1 ?Systolic pressure: 130-139. ?Diastolic pressure: 80-89. ?Hypertension stage 2 ?Systolic pressure: 140 or above. ?Diastolic pressure: 90 or above. ?How can this condition affect me? ?Managing your hypertension is an important responsibility. Over time, hypertension can damage the arteries and decrease blood flow to important parts of the body, including the brain, heart, and kidneys. Having untreated or uncontrolled hypertension can lead to: ?A heart attack. ?A stroke. ?A weakened blood vessel (aneurysm). ?Heart failure. ?Kidney damage. ?Eye damage. ?Metabolic syndrome. ?Memory and concentration problems. ?Vascular dementia. ?What actions can I take to manage this condition? ?Hypertension can be managed by making lifestyle changes and possibly by taking medicines. Your health care provider will help you make a plan to bring your blood pressure within a normal range. ?Nutrition ? ?Eat a diet that is high in fiber and potassium, and low in salt (sodium), added sugar, and fat. An example eating plan is called the Dietary Approaches to Stop Hypertension (DASH) diet. To eat this way: ?Eat plenty of fresh fruits and vegetables. Try to fill one-half of your plate at each meal with fruits and vegetables. ?Eat whole grains, such as whole-wheat pasta, brown rice, or whole-grain bread. Fill about one-fourth of your plate with whole grains. ?Eat low-fat dairy products. ?Avoid fatty cuts of meat, processed or cured meats, and poultry with skin. Fill about one-fourth of your plate with lean proteins such as fish, chicken without skin, beans, eggs, and tofu. ?Avoid pre-made and processed foods. These tend to be higher in sodium, added sugar, and fat. ?Reduce your daily sodium intake. Most people with hypertension should eat less than 1,500 mg of sodium a day. ?Lifestyle ? ?Work with your health care provider to maintain a healthy body weight or to lose weight. Ask what an ideal weight is for you. ?Get at  least 30 minutes of exercise that causes your heart to beat faster (aerobic exercise) most days of the week. Activities may include walking, swimming, or biking. ?Include exercise to strengthen your muscles (resistance exercise), such as weight lifting, as part of your weekly exercise routine. Try to do these types of exercises for 30 minutes at least 3 days a week. ?Do not use any products that contain nicotine or tobacco, such as cigarettes, e-cigarettes, and chewing tobacco. If you need help quitting, ask your health care provider. ?Control any long-term (chronic) conditions you have, such as high cholesterol or diabetes. ?Identify your sources of stress and find ways to manage stress. This may include meditation, deep breathing, or making time for fun activities. ?Alcohol use ?Do not drink alcohol if: ?Your health care provider tells you not to drink. ?You are pregnant, may be pregnant, or are planning to become pregnant. ?If you drink alcohol: ?Limit how much you use to: ?0-1 drink a day for women. ?0-2 drinks a day for men. ?Be aware of how much alcohol is in your drink. In the [place] one drink equals one 12 oz bottle of beer (355 mL), one 5 oz glass of wine (148 mL), or one 1? oz glass of hard liquor (44 mL). ?Medicines ?Your health care provider may prescribe medicine if lifestyle changes are not enough to get your blood pressure under control and if: ?Your systolic blood pressure is 130 or higher. ?Your diastolic blood pressure is 80 or higher. ?Take medicines only as told by your health  care provider. Follow the directions carefully. Blood pressure medicines must be taken as told by your health care provider. The medicine does not work as well when you skip doses. Skipping doses also puts you at risk for problems. ?Monitoring ?Before you monitor your blood pressure: ?Do not smoke, drink caffeinated beverages, or exercise within 30 minutes before taking a measurement. ?Use the bathroom and empty your bladder  (urinate). ?Sit quietly for at least 5 minutes before taking measurements. ?Monitor your blood pressure at home as told by your health care provider. To do this: ?Sit with your back straight and supported. ?P

## 2021-12-28 NOTE — Progress Notes (Signed)
? ?  Chronic Care Management ?Pharmacy Note ? ?12/28/2021 ?Name:  [name] MRN:  [id number] DOB:  07/30/1956 ? ?Attempted to call patient back regarding message sent by RN care coordinator - No answer, left message for patient to return call to discuss medications  ? ?Tomasa Blase, PharmD ?Clinical Pharmacist, [place]  ?

## 2021-12-29 ENCOUNTER — Ambulatory Visit: Payer: Medicare Other

## 2021-12-29 NOTE — Patient Instructions (Signed)
Visit Information ? ?Following are the goals we discussed today:  ? ?Manage My Medicine  ? ?Timeframe:  Long-Range Goal ?Priority:  High ?Start Date:      04/06/21                       ?Expected End Date:  12/30/2022                ? ?Follow Up Date April 2023 ?  ?- call for medicine refill 2 or 3 days before it runs out ?- call if I am sick and can't take my medicine ?- keep a list of all the medicines I take; vitamins and herbals too  ?-Restart HCTZ 12.5mg  daily  ? ?Plan: Telephone follow up appointment with care management team member scheduled for:  4 weeks  ?The patient has been provided with contact information for the care management team and has been advised to call with any health related questions or concerns.  ? ?Ellin Saba, PharmD ?Clinical Pharmacist, [facility]  ? ?Please call the care guide team at [phone] if you need to cancel or reschedule your appointment.  ? ?The patient verbalized understanding of instructions, educational materials, and care plan provided today and declined offer to receive copy of patient instructions, educational materials, and care plan.  ? ?

## 2021-12-29 NOTE — Progress Notes (Signed)
? ?Chronic Care Management ?Pharmacy Note ? ?12/29/2021 ?Name:  [name] MRN:  [id number] DOB:  [birth year] ? ?Summary: ?- Patient reports that since stopping hctz - BP has elevated, averaging 155/122 when checking at home  (reports that BP cuffs are older - reports proper procedure for BP monitoring) ?-states that he did have left over hctz 12.30m which he took and brought BP down to 115/87 - denied any issues when taken ?-Reports that he has not yet started rybelsus as discussed with last appointment, continuing to use soliqua prn  ? ?Recommendations/Changes made from today's visit: ?-Advised for patient to proceed to urgent care regarding his BP as DBP was elevated at 122 - explained to patient concern for organ damage if BP is uncontrolled at that level- patient states he was unlikely to do that, did not wish to have office appointment scheduled for next earliest day ?-Advised for patient to restart hctz 12.569mdaily as when he took earlier this month it brought BP down and into range - patient agreeable  ?-Ultimately patient agreeable to proceed to urgent care / ER should BP not improve by this evening after taking hctz 12.4m80m?-Advised for patient to hold soliqua and start rybelsus as directed, not agreeable at this time  ?-F/u in 1 month ? ? ?Subjective: ?[name] an [age]o. year old male who is a primary patient of JonJanith [name].  The CCM team was consulted for assistance with disease management and care coordination needs.   ? ?Engaged with patient by telephone for follow up visit in response to provider referral for pharmacy case management and/or care coordination services.  ? ?Consent to Services:  ?The patient was given information about Chronic Care Management services, agreed to services, and gave verbal consent prior to initiation of services.  Please see initial visit note for detailed documentation.  ? ?Patient Care Team: ?JonJanith [name] as PCP - General ?SooChesley MiresD as  Consulting Physician (Pulmonary Disease) ?EndNelva BushD as Consulting Physician (Cardiology) ?TouKnox RoyaltyN as Case Manager ?SzaTomasa BlasePHCpc Hosp [facility] Capestranoharmacist) ? ?Recent office visits: ?None since last visit  ? ?Recent consult visits: ?None since last visit  ? ?Hospital visits: ?None in previous 6 months ? ? ?Objective: ? ?Lab Results  ?Component Value Date  ? CREATININE 1.02 10/26/2021  ? BUN 17 10/26/2021  ? GFR 75.87 10/26/2021  ? GFRNONAA >60 08/19/2008  ? GFRAA  08/19/2008  ?  >60        ?The eGFR has been calculated ?using the MDRD equation. ?This calculation has not been ?validated in all clinical  ? NA 134 (L) 10/26/2021  ? K 4.5 10/26/2021  ? CALCIUM 10.0 10/26/2021  ? CO2 25 10/26/2021  ? GLUCOSE 164 (H) 10/26/2021  ? ? ?Lab Results  ?Component Value Date/Time  ? HGBA1C 7.6 (H) 10/26/2021 11:51 AM  ? HGBA1C 7.2 (H) 04/08/2021 03:00 PM  ? GFR 75.87 10/26/2021 11:51 AM  ? GFR 71.12 04/08/2021 03:00 PM  ? MICROALBUR <0.7 10/26/2021 11:51 AM  ? MICROALBUR <0.7 10/26/2020 11:58 AM  ?  ?Last diabetic Eye exam:  ?Lab Results  ?Component Value Date/Time  ? HMDIABEYEEXA No Retinopathy 07/20/2021 12:00 AM  ?  ?Last diabetic Foot exam:  ?Lab Results  ?Component Value Date/Time  ? HMDIABFOOTEX done 11/23/2010 12:00 AM  ?  ? ?Lab Results  ?Component Value Date  ? CHOL 205 (H) 10/26/2021  ? HDL 41.50 10/26/2021  ? LDL[place]  35 01/03/2017  ? LDLDIRECT 138.0 10/26/2021  ? TRIG 248.0 (H) 10/26/2021  ? CHOLHDL 5 10/26/2021  ? ? ?Hepatic Function Latest Ref Rng & Units 10/26/2021 10/26/2020 06/24/2019  ?Total Protein 6.0 - 8.3 g/dL 6.5 6.6 6.7  ?Albumin 3.5 - 5.2 g/dL 4.4 4.6 4.5  ?AST 0 - 37 U/L _0 ?ALT 0 - 53 U/L 38 52 30  ?Alk Phosphatase 39 - 117 U/L 87 82 81  ?Total Bilirubin 0.2 - 1.2 mg/dL 0.4 0.5 0.4  ?Bilirubin, Direct 0.0 - 0.3 mg/dL 0.1 0.1 0.1  ? ? ?Lab Results  ?Component Value Date/Time  ? TSH 3.24 10/26/2021 11:51 AM  ? TSH 2.62 10/26/2020 11:58 AM  ? ? ?CBC Latest Ref Rng & Units 10/26/2021  10/26/2020 06/24/2019  ?WBC 4.0 - 10.5 K/uL 8.6 8.8 9.4  ?Hemoglobin 13.0 - 17.0 g/dL 14.2 14.9 14.7  ?Hematocrit 39.0 - 52.0 % 41.6 43.3 42.5  ?Platelets 150.0 - 400.0 K/uL 244.0 260.0 282.0  ? ? ?Lab Results  ?Component Value Date/Time  ? VD25OH 34.31 06/24/2019 10:24 AM  ? VD25OH 22.23 (L) 06/12/2018 01:58 PM  ? ? ?Clinical ASCVD: No  ?The 10-year ASCVD risk score (Arnett DK, et al., 2019) is: 38.7% ?  Values used to calculate the score: ?    Age: 66 years ?    Sex: Male ?    Is Non-Hispanic African American: No ?    Diabetic: Yes ?    Tobacco smoker: Yes ?    Systolic Blood Pressure: 811 mmHg ?    Is BP treated: Yes ?    HDL Cholesterol: 41.5 mg/dL ?    Total Cholesterol: 205 mg/dL   ? ?Depression screen [facility] 2/9 11/16/2021 08/23/2021 07/28/2021  ?Decreased Interest 0 1 0  ?Down, Depressed, Hopeless 0 0 0  ?PHQ - 2 Score 0 1 0  ?Altered sleeping - - -  ?Tired, decreased energy - - -  ?Change in appetite - - -  ?Feeling bad or failure about yourself  - - -  ?Trouble concentrating - - -  ?Moving slowly or fidgety/restless - - -  ?Suicidal thoughts - - -  ?PHQ-9 Score - - -  ?Difficult doing work/chores - - -  ?  ? ?Social History  ? ?Tobacco Use  ?Smoking Status Former  ? Packs/day: 1.00  ? Years: 16.00  ? Pack years: 16.00  ? Types: Cigarettes  ? Quit date: 10/17/2003  ? Years since quitting: 18.2  ?Smokeless Tobacco Never  ? ?BP Readings from Last 3 Encounters:  ?10/26/21 118/76  ?07/28/21 122/80  ?04/25/21 120/86  ? ?Pulse Readings from Last 3 Encounters:  ?10/26/21 98  ?07/28/21 89  ?04/25/21 93  ? ?Wt Readings from Last 3 Encounters:  ?10/26/21 220 lb (99.8 kg)  ?07/28/21 219 lb 9.6 oz (99.6 kg)  ?04/25/21 219 lb 9.6 oz (99.6 kg)  ? ?BMI Readings from Last 3 Encounters:  ?10/26/21 34.46 kg/m?  ?07/28/21 34.39 kg/m?  ?04/25/21 34.39 kg/m?  ? ? ?Assessment/Interventions: Review of patient past medical history, allergies, medications, health status, including review of consultants reports, laboratory and other test data,  was performed as part of comprehensive evaluation and provision of chronic care management services.  ? ?SDOH:  (Social Determinants of Health) assessments and interventions performed: Yes ? ?SDOH Screenings  ? ?Alcohol Screen: Low Risk   ? Last Alcohol Screening Score (AUDIT): 0  ?Depression (PHQ2-9): Low Risk   ? PHQ-2 Score: 0  ?Financial Resource Strain: Low  Risk   ? Difficulty of Paying Living Expenses: Not very hard  ?Food Insecurity: No Food Insecurity  ? Worried About Charity fundraiser in the Last Year: Never true  ? Ran Out of Food in the Last Year: Never true  ?Housing: Low Risk   ? Last Housing Risk Score: 0  ?Physical Activity: Not on file  ?Social Connections: Moderately Isolated  ? Frequency of Communication with Friends and Family: Three times a week  ? Frequency of Social Gatherings with Friends and Family: Once a week  ? Attends Religious Services: 1 to 4 times per year  ? Active Member of Clubs or Organizations: No  ? Attends Archivist Meetings: Never  ? Marital Status: Divorced  ?Stress: No Stress Concern Present  ? Feeling of Stress : Only a little  ?Tobacco Use: Medium Risk  ? Smoking Tobacco Use: Former  ? Smokeless Tobacco Use: Never  ? Passive Exposure: Not on file  ?Transportation Needs: No Transportation Needs  ? Lack of Transportation (Medical): No  ? Lack of Transportation (Non-Medical): No  ? ? ?CCM Care Plan ? ?Allergies  ?Allergen Reactions  ? Latex   ? Morphine   ? Statins   ?  REACTION: weakness, muscle pains  ? ? ?Medications Reviewed Today   ? ? Reviewed by Knox Royalty, RN (Registered Nurse) on 12/28/21 at 39  Med List Status: <None>  ? ?Medication Order Taking? Sig Documenting Provider Last Dose Status Informant  ?aspirin 81 MG EC tablet [id number] No Take 81 mg by mouth daily. [provider] Taking Active   ?Blood Glucose Monitoring Suppl (ONETOUCH VERIO) w/Device KIT [id number] No 1 Act by Does not apply route in the morning and at bedtime. [name], MD Taking Active   ?[facility] D3 50 MCG (2000 UT) CAPS [id number] No TAKE 2 CAPSULES (4,000 UNITS TOTAL) BY MOUTH DAILY. [name], MD Taking Active   ?ezetimibe (ZETIA) 10 MG tablet [id number] No TAKE

## 2022-01-03 ENCOUNTER — Telehealth: Payer: Medicare Other

## 2022-01-13 DIAGNOSIS — E118 Type 2 diabetes mellitus with unspecified complications: Secondary | ICD-10-CM | POA: Diagnosis not present

## 2022-01-13 DIAGNOSIS — I10 Essential (primary) hypertension: Secondary | ICD-10-CM

## 2022-01-30 ENCOUNTER — Other Ambulatory Visit: Payer: Self-pay | Admitting: Internal Medicine

## 2022-01-30 DIAGNOSIS — I10 Essential (primary) hypertension: Secondary | ICD-10-CM

## 2022-02-03 ENCOUNTER — Ambulatory Visit (INDEPENDENT_AMBULATORY_CARE_PROVIDER_SITE_OTHER): Payer: Medicare Other

## 2022-02-03 DIAGNOSIS — E118 Type 2 diabetes mellitus with unspecified complications: Secondary | ICD-10-CM

## 2022-02-03 DIAGNOSIS — I10 Essential (primary) hypertension: Secondary | ICD-10-CM

## 2022-02-03 NOTE — Progress Notes (Signed)
? ?Chronic Care Management ?Pharmacy Note ? ?02/03/2022 ?Name:  [name] MRN:  [id number] DOB:  Nov 04, 1955 ? ?Summary: ?- Patient reports that since last appointment blood pressure has been better controlled, averaging ~115/80-140/90 ?-Reports that he had reduced olmesartan to $RemoveBefor'20mg'PKdOSlMABhoy$  daily as BP was dropping low when taking $RemoveBef'40mg'KQUAwNMOuP$  daily  ?-No longer using soliqua, reports that he has been increasing his activity levels and watching his diet, BG has been controlled averaging 115-150's  ? ?Recommendations/Changes made from today's visit: ?-Recommending for patient to continue current medications, advised to continue monitoring BP and BG at least once daily, to reach out should control of either be lost before next appointment  ? ? ?Subjective: ?[name] is an 66 y.o. year old male who is a primary patient of [name], MD.  The CCM team was consulted for assistance with disease management and care coordination needs.   ? ?Engaged with patient by telephone for follow up visit in response to provider referral for pharmacy case management and/or care coordination services.  ? ?Consent to Services:  ?The patient was given information about Chronic Care Management services, agreed to services, and gave verbal consent prior to initiation of services.  Please see initial visit note for detailed documentation.  ? ?Patient Care Team: ?[name], MD as PCP - General ?Chesley Mires, MD as Consulting Physician (Pulmonary Disease) ?Nelva Bush, MD as Consulting Physician (Cardiology) ?Knox Royalty, RN as Case Manager ?Tomasa Blase, [facility] (Pharmacist) ? ?Recent office visits: ?None since last visit  ? ?Recent consult visits: ?None since last visit  ? ?Hospital visits: ?None in previous 6 months ? ? ?Objective: ? ?Lab Results  ?Component Value Date  ? CREATININE 1.02 10/26/2021  ? BUN 17 10/26/2021  ? GFR 75.87 10/26/2021  ? GFRNONAA >60 08/19/2008  ? GFRAA  08/19/2008  ?  >60        ?The eGFR has been  calculated ?using the MDRD equation. ?This calculation has not been ?validated in all clinical  ? NA 134 (L) 10/26/2021  ? K 4.5 10/26/2021  ? CALCIUM 10.0 10/26/2021  ? CO2 25 10/26/2021  ? GLUCOSE 164 (H) 10/26/2021  ? ? ?Lab Results  ?Component Value Date/Time  ? HGBA1C 7.6 (H) 10/26/2021 11:51 AM  ? HGBA1C 7.2 (H) 04/08/2021 03:00 PM  ? GFR 75.87 10/26/2021 11:51 AM  ? GFR 71.12 04/08/2021 03:00 PM  ? MICROALBUR <0.7 10/26/2021 11:51 AM  ? MICROALBUR <0.7 10/26/2020 11:58 AM  ?  ?Last diabetic Eye exam:  ?Lab Results  ?Component Value Date/Time  ? HMDIABEYEEXA No Retinopathy 07/20/2021 12:00 AM  ?  ?Last diabetic Foot exam:  ?Lab Results  ?Component Value Date/Time  ? HMDIABFOOTEX done 11/23/2010 12:00 AM  ?  ? ?Lab Results  ?Component Value Date  ? CHOL 205 (H) 10/26/2021  ? HDL 41.50 10/26/2021  ? [place] 35 01/03/2017  ? LDLDIRECT 138.0 10/26/2021  ? TRIG 248.0 (H) 10/26/2021  ? CHOLHDL 5 10/26/2021  ? ? ? ?  Latest Ref Rng & Units 10/26/2021  ? 11:51 AM 10/26/2020  ? 11:58 AM 06/24/2019  ? 10:24 AM  ?Hepatic Function  ?Total Protein 6.0 - 8.3 g/dL 6.5   6.6   6.7    ?Albumin 3.5 - 5.2 g/dL 4.4   4.6   4.5    ?AST 0 - 37 U/L $Remo'23   25   17    'RTaWL$ ?ALT 0 - 53 U/L 38   52   30    ?  Alk Phosphatase 39 - 117 U/L 87   82   81    ?Total Bilirubin 0.2 - 1.2 mg/dL 0.4   0.5   0.4    ?Bilirubin, Direct 0.0 - 0.3 mg/dL 0.1   0.1   0.1    ? ? ?Lab Results  ?Component Value Date/Time  ? TSH 3.24 10/26/2021 11:51 AM  ? TSH 2.62 10/26/2020 11:58 AM  ? ? ? ?  Latest Ref Rng & Units 10/26/2021  ? 11:51 AM 10/26/2020  ? 11:58 AM 06/24/2019  ? 10:24 AM  ?CBC  ?WBC 4.0 - 10.5 K/uL 8.6   8.8   9.4    ?Hemoglobin 13.0 - 17.0 g/dL 14.2   14.9   14.7    ?Hematocrit 39.0 - 52.0 % 41.6   43.3   42.5    ?Platelets 150.0 - 400.0 K/uL 244.0   260.0   282.0    ? ? ?Lab Results  ?Component Value Date/Time  ? VD25OH 34.31 06/24/2019 10:24 AM  ? VD25OH 22.23 (L) 06/12/2018 01:58 PM  ? ? ?Clinical ASCVD: No  ?The 10-year ASCVD risk score (Arnett DK, et  al., 2019) is: 38.7% ?  Values used to calculate the score: ?    Age: 16 years ?    Sex: Male ?    Is Non-Hispanic African American: No ?    Diabetic: Yes ?    Tobacco smoker: Yes ?    Systolic Blood Pressure: 161 mmHg ?    Is BP treated: Yes ?    HDL Cholesterol: 41.5 mg/dL ?    Total Cholesterol: 205 mg/dL   ? ? ?  11/16/2021  ?  1:15 PM 08/23/2021  ?  6:13 PM 07/28/2021  ? 12:31 PM  ?Depression screen PHQ 2/9  ?Decreased Interest 0 1 0  ?Down, Depressed, Hopeless 0 0 0  ?PHQ - 2 Score 0 1 0  ?  ? ?Social History  ? ?Tobacco Use  ?Smoking Status Former  ? Packs/day: 1.00  ? Years: 16.00  ? Pack years: 16.00  ? Types: Cigarettes  ? Quit date: 10/17/2003  ? Years since quitting: 18.3  ?Smokeless Tobacco Never  ? ?BP Readings from Last 3 Encounters:  ?10/26/21 118/76  ?07/28/21 122/80  ?04/25/21 120/86  ? ?Pulse Readings from Last 3 Encounters:  ?10/26/21 98  ?07/28/21 89  ?04/25/21 93  ? ?Wt Readings from Last 3 Encounters:  ?10/26/21 220 lb (99.8 kg)  ?07/28/21 219 lb 9.6 oz (99.6 kg)  ?04/25/21 219 lb 9.6 oz (99.6 kg)  ? ?BMI Readings from Last 3 Encounters:  ?10/26/21 34.46 kg/m?  ?07/28/21 34.39 kg/m?  ?04/25/21 34.39 kg/m?  ? ? ?Assessment/Interventions: Review of patient past medical history, allergies, medications, health status, including review of consultants reports, laboratory and other test data, was performed as part of comprehensive evaluation and provision of chronic care management services.  ? ?SDOH:  (Social Determinants of Health) assessments and interventions performed: Yes ? ?SDOH Screenings  ? ?Alcohol Screen: Low Risk   ? Last Alcohol Screening Score (AUDIT): 0  ?Depression (PHQ2-9): Low Risk   ? PHQ-2 Score: 0  ?Financial Resource Strain: Low Risk   ? Difficulty of Paying Living Expenses: Not very hard  ?Food Insecurity: No Food Insecurity  ? Worried About Charity fundraiser in the Last Year: Never true  ? Ran Out of Food in the Last Year: Never true  ?Housing: Low Risk   ? Last Housing Risk  Score: 0  ?  Physical Activity: Not on file  ?Social Connections: Moderately Isolated  ? Frequency of Communication with Friends and Family: Three times a week  ? Frequency of Social Gatherings with Friends and Family: Once a week  ? Attends Religious Services: 1 to 4 times per year  ? Active Member of Clubs or Organizations: No  ? Attends Archivist Meetings: Never  ? Marital Status: Divorced  ?Stress: No Stress Concern Present  ? Feeling of Stress : Only a little  ?Tobacco Use: Medium Risk  ? Smoking Tobacco Use: Former  ? Smokeless Tobacco Use: Never  ? Passive Exposure: Not on file  ?Transportation Needs: No Transportation Needs  ? Lack of Transportation (Medical): No  ? Lack of Transportation (Non-Medical): No  ? ? ?CCM Care Plan ? ?Allergies  ?Allergen Reactions  ? Latex   ? Morphine   ? Statins   ?  REACTION: weakness, muscle pains  ? ? ?Medications Reviewed Today   ? ? Reviewed by Tomasa Blase, [facility] (Pharmacist) on 02/03/22 at [phone]  Med List Status: <None>  ? ?Medication Order Taking? Sig Documenting Provider Last Dose Status Informant  ?aspirin 81 MG EC tablet [id number] Yes Take 81 mg by mouth daily. [provider] Taking Active   ?Blood Glucose Monitoring Suppl (ONETOUCH VERIO) w/Device KIT [id number] Yes 1 Act by Does not apply route in the morning and at bedtime. [name], MD Taking Active   ?CVS D3 50 MCG (2000 UT) CAPS [id number] Yes TAKE 2 CAPSULES (4,000 UNITS TOTAL) BY MOUTH DAILY. [name], MD Taking Active   ?ezetimibe (ZETIA) 10 MG tablet [id number] Yes TAKE 1 TABLET BY MOUTH EVERY DAY [name], MD Taking Active   ?famotidine (PEPCID) 40 MG tablet [id number] Yes TAKE 1 TABLET BY MOUTH EVERY DAY  ?Patient taking differently: Take 40 mg by mouth daily as needed.  ? [name], MD Taking Active   ?fexofenadine (ALLEGRA) 180 MG tablet [id number] Yes Take 180 mg by mouth daily as needed for allergies. [provider] Taking Active   ?glucose blood  (ONETOUCH VERIO) test strip [id number] Yes 1 each by Other route in the morning and at bedtime. Use as instructed [name], MD Taking Active   ?KLOR-CON M20 20 MEQ tablet [id number] Yes TAKE 1 TABLET BY MOUTH TW

## 2022-02-03 NOTE — Patient Instructions (Signed)
Visit Information ? ?Following are the goals we discussed today:  ? ?Manage My Medicine  ? ?Timeframe:  Long-Range Goal ?Priority:  High ?Start Date:      04/06/21                       ?Expected End Date:  12/30/2022                ? ?Follow Up Date 04/2023 ?  ?- call for medicine refill 2 or 3 days before it runs out ?- call if I am sick and can't take my medicine ?- keep a list of all the medicines I take; vitamins and herbals too  ?  ?Why is this important?   ?These steps will help you keep on track with your medicines. ? ?Plan: Telephone follow up appointment with care management team member scheduled for:  4 months ?The patient has been provided with contact information for the care management team and has been advised to call with any health related questions or concerns.  ? ?Ellin Saba, PharmD ?Clinical Pharmacist, [facility]  ? ?Please call the care guide team at [phone] if you need to cancel or reschedule your appointment.  ? ?The patient verbalized understanding of instructions, educational materials, and care plan provided today and declined offer to receive copy of patient instructions, educational materials, and care plan.  ? ?

## 2022-02-12 DIAGNOSIS — E785 Hyperlipidemia, unspecified: Secondary | ICD-10-CM

## 2022-02-12 DIAGNOSIS — I10 Essential (primary) hypertension: Secondary | ICD-10-CM | POA: Diagnosis not present

## 2022-02-12 DIAGNOSIS — J439 Emphysema, unspecified: Secondary | ICD-10-CM

## 2022-02-12 DIAGNOSIS — E1159 Type 2 diabetes mellitus with other circulatory complications: Secondary | ICD-10-CM

## 2022-02-12 DIAGNOSIS — Z7984 Long term (current) use of oral hypoglycemic drugs: Secondary | ICD-10-CM | POA: Diagnosis not present

## 2022-02-14 ENCOUNTER — Ambulatory Visit (INDEPENDENT_AMBULATORY_CARE_PROVIDER_SITE_OTHER): Payer: Medicare Other | Admitting: *Deleted

## 2022-02-14 DIAGNOSIS — I10 Essential (primary) hypertension: Secondary | ICD-10-CM

## 2022-02-14 DIAGNOSIS — E118 Type 2 diabetes mellitus with unspecified complications: Secondary | ICD-10-CM

## 2022-02-14 NOTE — Chronic Care Management (AMB) (Signed)
?Chronic Care Management  ? ?CCM RN Visit Note ? ?02/14/2022 ?Name: [name] MRN: [id number] DOB: 1956-08-24 ? ?Subjective: ?[name] is a 66 y.o. year old male who is a primary care patient of [name], MD. The care management team was consulted for assistance with disease management and care coordination needs.   ? ?Engaged with patient by telephone for follow up visit in response to provider referral for case management and/or care coordination services.  ? ?Consent to Services:  ?The patient was given information about Chronic Care Management services, agreed to services, and gave verbal consent prior to initiation of services.  Please see initial visit note for detailed documentation.  ?Patient agreed to services and verbal consent obtained.  ? ?Assessment: Review of patient past medical history, allergies, medications, health status, including review of consultants reports, laboratory and other test data, was performed as part of comprehensive evaluation and provision of chronic care management services.  ?CCM Care Plan ? ?Allergies  ?Allergen Reactions  ? Latex   ? Morphine   ? Statins   ?  REACTION: weakness, muscle pains  ? ?Outpatient Encounter Medications as of 02/14/2022  ?Medication Sig Note  ? aspirin 81 MG EC tablet Take 81 mg by mouth daily.   ? Blood Glucose Monitoring Suppl (ONETOUCH VERIO) w/Device KIT 1 Act by Does not apply route in the morning and at bedtime.   ? [employer] D3 50 MCG (2000 UT) CAPS TAKE 2 CAPSULES (4,000 UNITS TOTAL) BY MOUTH DAILY.   ? ezetimibe (ZETIA) 10 MG tablet TAKE 1 TABLET BY MOUTH EVERY DAY   ? famotidine (PEPCID) 40 MG tablet TAKE 1 TABLET BY MOUTH EVERY DAY (Patient taking differently: Take 40 mg by mouth daily as needed.)   ? fexofenadine (ALLEGRA) 180 MG tablet Take 180 mg by mouth daily as needed for allergies.   ? glucose blood (ONETOUCH VERIO) test strip 1 each by Other route in the morning and at bedtime. Use as instructed   ? hydrochlorothiazide  (MICROZIDE) 12.5 MG capsule Take 12.5 mg by mouth daily. 02/14/2022: 02/14/22- reports not taking every day; continues to "decide" whether or not to take based on daily blood pressures at home: today, reports taking "about 2 or 3 times a week"  ? KLOR-CON M20 20 MEQ tablet TAKE 1 TABLET BY MOUTH TWICE A DAY   ? KRILL OIL PO Take 400 mg by mouth daily.   ? Lancets (ONETOUCH ULTRASOFT) lancets    ? metFORMIN (GLUCOPHAGE) 1000 MG tablet Take 1 tablet (1,000 mg total) by mouth 2 (two) times daily with a meal.   ? NON FORMULARY Oxygen continuous 1.5 liters with activity   ? olmesartan (BENICAR) 40 MG tablet TAKE 1 TABLET BY MOUTH EVERY DAY (Patient taking differently: Take 20 mg by mouth daily.)   ? ONETOUCH VERIO test strip USE 1 PER DAY   ? Semaglutide (RYBELSUS) 3 MG TABS Take 3 mg by mouth daily. (Patient not taking: Reported on 12/06/2021)   ? Tiotropium Bromide-Olodaterol (STIOLTO RESPIMAT) 2.5-2.5 MCG/ACT AERS Inhale 2 puffs into the lungs daily. 11/30/2021: 11/30/21- Reports using as needed; today, reports does not use rescue inhaler  ? ?No facility-administered encounter medications on file as of 02/14/2022.  ? ?Patient Active Problem List  ? Diagnosis Date Noted  ? Statin myopathy 04/25/2021  ? Encounter for general adult medical examination with abnormal findings 10/26/2020  ? Vitamin D deficiency disease 06/12/2018  ? GERD with esophagitis 12/12/2017  ? Thoracic aortic aneurysm  without rupture ([place]) 12/12/2017  ? Screen for colon cancer 01/12/2016  ? Routine general medical examination at a health care facility 07/16/2015  ? Benign prostatic hyperplasia 07/13/2014  ? Type II diabetes mellitus with manifestations ([place]) 11/12/2013  ? Pure hyperglyceridemia 07/09/2013  ? Upper airway cough syndrome 02/06/2012  ? Hyperlipidemia with target LDL less than 130 07/05/2011  ? EMPHYSEMA 12/07/2008  ? Essential hypertension, benign 08/26/2008  ? INTERSTITIAL LUNG DISEASE 08/26/2008  ? Respiratory failure with hypoxia ([place])  08/26/2008  ? ?Conditions to be addressed/monitored:  HTN, COPD, and DMII ? ?Care Plan : RN Care Manager Plan of Care  ?Updates made by Knox Royalty, RN since 02/14/2022 12:00 AM  ?  ? ?Problem: Chronic Disease Management Needs   ?Priority: High  ?  ? ?Long-Range Goal: Improvement in adherence to established plan of care for long term chronic disease management   ?Start Date: 11/16/2021  ?Expected End Date: 11/16/2022  ?Priority: High  ?Note:   ?Current Barriers:  ?Chronic Disease Management support and education needs related to COPD and DMII ?12/28/21: Ongoing non-adherence in following healthcare provider advice and recommendations: primarily with medications and diet ? ?RNCM Clinical Goal(s):  ?Patient will demonstrate ongoing health management independence as evidenced by adherence to plan of care for DMII; COPD        through collaboration with RN Care manager, provider, and care team.  ?Medication adherence issues: CCM Pharmacy team active ? ?Interventions: ?1:1 collaboration with primary care provider regarding development and update of comprehensive plan of care as evidenced by provider attestation and co-signature ?Inter-disciplinary care team collaboration (see longitudinal plan of care) ?Evaluation of current treatment plan related to  self management and patient's adherence to plan as established by provider ?Initial Assessment completed 11/16/21 ?Review of patient status, including review of consultants reports, relevant laboratory and other test results, and medications completed ?Pain assessment updated: denies pain today ?Medications discussed:  reports continues to independently self-manage and today he denies current concerns/ issues/ questions around medications; endorses adherence to taking all medications as prescribed ?Confirms he is taking Olmesartan 20 mg po QD ?Confirms he is taking HCTZ 12.5 mg "about 3 times per week;" states he takes based on what his daily blood pressures at home  are ?Reports that he thinks his blood pressure medication "needs to be changed to something different;" encouraged patient to share his thoughts with PCP at upcoming scheduled PCP office visit, to continue monitoring blood pressures at home daily and to write down on paper; encouraged him to take recorded blood pressures from home to upcoming PCP appointment- he verbalizes understanding and states he will do ?Confirms taking metformin 10000 mg po BID- this is currently the only medication for DMII he reports taking today ?Confirms not taking Rybelsus ?Confirms he is maintaining communication with CCM Pharmacy team around medication adherence, questions, medication optimization ?Reviewed upcoming scheduled provider appointments: 04/26/22- PCP; patient confirms is aware of all and has plans to attend as scheduled ?Discussed plans with patient for ongoing care management follow up and provided patient with direct contact information for care management team    ? ?COPD/ HTN: (Status: 02/14/22: Goal on Track (progressing): YES.) Long Term Goal  ? ?Advised patient to track and manage COPD triggers ?Advised patient to self assesses COPD action plan zone and make appointment with provider if in the yellow zone for 48 hours without improvement ?Advised patient to engage in light exercise as tolerated 3-5 days a week to aid in the the management  of COPD ?Discussed the importance of adequate rest and management of fatigue with COPD ?Reports continues using home O2 prn- reports using "about like I always do;" clarified with patient using "every couple of days for short periods of time;" continues using 1-3 L/min ?Confirmed with patient no current clinical concerns around breathing status-- he continues to report that he occasionally "drops" with home SaO2 readings with activity- he states he rests, uses O2 as needed and this spontaneously improves without additional intervention- continues using maintenance inhaler as rescue  medication ?Reinforced previously provided education around action plan for episodes shortness of breath: patient will require ongoing reinforcement and support around same ?Discussed his current action plan for COPD flares: pa

## 2022-02-14 NOTE — Patient Instructions (Signed)
Visit Information ? ?[name] Toula Moos)- thank you for taking time to talk with me today. Please don't hesitate to contact me if I can be of assistance to you before our next scheduled telephone appointment ? ?Below are the goals we discussed today:  ?Patient Self-Care Activities: ?Patient [name] will: ?Take medications as prescribed ?Attend all scheduled provider appointments ?Call pharmacy for medication refills ?Call provider office for new concerns or questions ?Continue to check fasting (first thing in the morning, before eating) blood sugars at home; it is a good idea to check your blood sugars again 2 hours after eating a regular sized meal-- this blood sugar will give your doctor good information about how well your medications are working ?Continue to follow heart healthy, low salt, low cholesterol, carbohydrate-modified, low sugar diet ?Continue checking your blood pressure at home every day and write these values down on paper-- please take your recorded blood pressure readings from home to your upcoming appointment with Dr. Ronnald Ramp on April 26, 2022 so he can review them- this will help him make decisions on the medication you should or should not be taking ?Continue to maintain contact with the pharmacist at Dr. Ronnald Ramp' office that has been helping you manage your medications ?Keep up the great work preventing falls at home! ?Continue to stay as active as possible without over-doing activity: take your time and pace yourself ? ?Our next scheduled telephone follow up visit/ appointment is scheduled on: Friday, April 28, 2022 at 9:45 am- This is a PHONE [place] appointment ? ?If you need to cancel or re-schedule our visit, please call [phone] and our care guide team will be happy to assist you. ?  ?I look forward to hearing about your progress. ?  ?Oneta Rack, RN, BSN, CCRN Alumnus ?[place] ?([phone]: direct office ? ?If you are experiencing a Mental  Health or [place] or need someone to talk to, please  ?call the Suicide and Crisis Lifeline: 988 ?call the [place] National Suicide Prevention Lifeline: (214)190-2559 or TTY: (240) 345-9282 TTY (289)047-6022) to talk to a trained counselor ?call 1-800-273-TALK (toll free, 24 hour hotline) ?go to [facility] [address] [place] [phone]) ?call 911  ? ?The patient verbalized understanding of instructions, educational materials, and care plan provided today and agreed to receive a mailed copy of patient instructions, educational materials, and care plan ? ?Hypertension, Adult ?Hypertension is another name for high blood pressure. High blood pressure forces your heart to work harder to pump blood. This can cause problems over time. ?There are two numbers in a blood pressure reading. There is a top number (systolic) over a bottom number (diastolic). It is best to have a blood pressure that is below 120/80. ?What are the causes? ?The cause of this condition is not known. Some other conditions can lead to high blood pressure. ?What increases the risk? ?Some lifestyle factors can make you more likely to develop high blood pressure: ?Smoking. ?Not getting enough exercise or physical activity. ?Being overweight. ?Having too much fat, sugar, calories, or salt (sodium) in your diet. ?Drinking too much alcohol. ?Other risk factors include: ?Having any of these conditions: ?Heart disease. ?Diabetes. ?High cholesterol. ?Kidney disease. ?Obstructive sleep apnea. ?Having a family history of high blood pressure and high cholesterol. ?Age. The risk increases with age. ?Stress. ?What are the signs or symptoms? ?High blood pressure may not cause symptoms. Very high blood pressure (hypertensive crisis) may cause: ?Headache. ?Fast  or uneven heartbeats (palpitations). ?Shortness of breath. ?Nosebleed. ?Vomiting or feeling like you may vomit (nauseous). ?Changes in how you  see. ?Very bad chest pain. ?Feeling dizzy. ?Seizures. ?How is this treated? ?This condition is treated by making healthy lifestyle changes, such as: ?Eating healthy foods. ?Exercising more. ?Drinking less alcohol. ?Your doctor may prescribe medicine if lifestyle changes do not help enough and if: ?Your top number is above 130. ?Your bottom number is above 80. ?Your personal target blood pressure may vary. ?Follow these instructions at home: ?Eating and drinking ? ?If told, follow the DASH eating plan. To follow this plan: ?Fill one half of your plate at each meal with fruits and vegetables. ?Fill one fourth of your plate at each meal with whole grains. Whole grains include whole-wheat pasta, brown rice, and whole-grain bread. ?Eat or drink low-fat dairy products, such as skim milk or low-fat yogurt. ?Fill one fourth of your plate at each meal with low-fat (lean) proteins. Low-fat proteins include fish, chicken without skin, eggs, beans, and tofu. ?Avoid fatty meat, cured and processed meat, or chicken with skin. ?Avoid pre-made or processed food. ?Limit the amount of salt in your diet to less than 1,500 mg each day. ?Do not drink alcohol if: ?Your doctor tells you not to drink. ?You are pregnant, may be pregnant, or are planning to become pregnant. ?If you drink alcohol: ?Limit how much you have to: ?0-1 drink a day for women. ?0-2 drinks a day for men. ?Know how much alcohol is in your drink. In the [place] one drink equals one 12 oz bottle of beer (355 mL), one 5 oz glass of wine (148 mL), or one 1? oz glass of hard liquor (44 mL). ?Lifestyle ? ?Work with your doctor to stay at a healthy weight or to lose weight. Ask your doctor what the best weight is for you. ?Get at least 30 minutes of exercise that causes your heart to beat faster (aerobic exercise) most days of the week. This may include walking, swimming, or biking. ?Get at least 30 minutes of exercise that strengthens your muscles (resistance exercise) at  least 3 days a week. This may include lifting weights or doing Pilates. ?Do not smoke or use any products that contain nicotine or tobacco. If you need help quitting, ask your doctor. ?Check your blood pressure at home as told by your doctor. ?Keep all follow-up visits. ?Medicines ?Take over-the-counter and prescription medicines only as told by your doctor. Follow directions carefully. ?Do not skip doses of blood pressure medicine. The medicine does not work as well if you skip doses. Skipping doses also puts you at risk for problems. ?Ask your doctor about side effects or reactions to medicines that you should watch for. ?Contact a doctor if: ?You think you are having a reaction to the medicine you are taking. ?You have headaches that keep coming back. ?You feel dizzy. ?You have swelling in your ankles. ?You have trouble with your vision. ?Get help right away if: ?You get a very bad headache. ?You start to feel mixed up (confused). ?You feel weak or numb. ?You feel faint. ?You have very bad pain in your: ?Chest. ?Belly (abdomen). ?You vomit more than once. ?You have trouble breathing. ?These symptoms may be an emergency. Get help right away. Call 911. ?Do not wait to see if the symptoms will go away. ?Do not drive yourself to the hospital. ?Summary ?Hypertension is another name for high blood pressure. ?High blood pressure forces your heart to work  harder to pump blood. ?For most people, a normal blood pressure is less than 120/80. ?Making healthy choices can help lower blood pressure. If your blood pressure does not get lower with healthy choices, you may need to take medicine. ?This information is not intended to replace advice given to you by your health care provider. Make sure you discuss any questions you have with your health care provider. ?Document Revised: 07/21/2021 Document Reviewed: 07/21/2021 ?Elsevier Patient Education ? [place]. ?  ? ?

## 2022-02-15 ENCOUNTER — Other Ambulatory Visit: Payer: Self-pay | Admitting: Internal Medicine

## 2022-02-15 ENCOUNTER — Telehealth: Payer: Medicare Other

## 2022-02-16 ENCOUNTER — Other Ambulatory Visit: Payer: Self-pay | Admitting: Internal Medicine

## 2022-03-15 DIAGNOSIS — I10 Essential (primary) hypertension: Secondary | ICD-10-CM

## 2022-03-15 DIAGNOSIS — J449 Chronic obstructive pulmonary disease, unspecified: Secondary | ICD-10-CM | POA: Diagnosis not present

## 2022-03-15 DIAGNOSIS — Z87891 Personal history of nicotine dependence: Secondary | ICD-10-CM

## 2022-03-15 DIAGNOSIS — Z7984 Long term (current) use of oral hypoglycemic drugs: Secondary | ICD-10-CM

## 2022-03-15 DIAGNOSIS — E1159 Type 2 diabetes mellitus with other circulatory complications: Secondary | ICD-10-CM | POA: Diagnosis not present

## 2022-03-21 ENCOUNTER — Other Ambulatory Visit: Payer: Self-pay | Admitting: Internal Medicine

## 2022-03-21 DIAGNOSIS — E118 Type 2 diabetes mellitus with unspecified complications: Secondary | ICD-10-CM

## 2022-04-16 ENCOUNTER — Other Ambulatory Visit: Payer: Self-pay | Admitting: Internal Medicine

## 2022-04-16 DIAGNOSIS — I7121 Aneurysm of the ascending aorta, without rupture: Secondary | ICD-10-CM

## 2022-04-26 ENCOUNTER — Encounter: Payer: Self-pay | Admitting: Internal Medicine

## 2022-04-26 ENCOUNTER — Ambulatory Visit (INDEPENDENT_AMBULATORY_CARE_PROVIDER_SITE_OTHER): Payer: Medicare Other | Admitting: Internal Medicine

## 2022-04-26 VITALS — BP 138/86 | HR 95 | Temp 97.9°F | Resp 20 | Ht 67.0 in | Wt 215.0 lb

## 2022-04-26 DIAGNOSIS — T380X5A Adverse effect of glucocorticoids and synthetic analogues, initial encounter: Secondary | ICD-10-CM | POA: Diagnosis not present

## 2022-04-26 DIAGNOSIS — J9611 Chronic respiratory failure with hypoxia: Secondary | ICD-10-CM

## 2022-04-26 DIAGNOSIS — I10 Essential (primary) hypertension: Secondary | ICD-10-CM

## 2022-04-26 DIAGNOSIS — J438 Other emphysema: Secondary | ICD-10-CM | POA: Diagnosis not present

## 2022-04-26 DIAGNOSIS — J8410 Pulmonary fibrosis, unspecified: Secondary | ICD-10-CM

## 2022-04-26 DIAGNOSIS — G720 Drug-induced myopathy: Secondary | ICD-10-CM | POA: Insufficient documentation

## 2022-04-26 DIAGNOSIS — Z23 Encounter for immunization: Secondary | ICD-10-CM

## 2022-04-26 DIAGNOSIS — E118 Type 2 diabetes mellitus with unspecified complications: Secondary | ICD-10-CM | POA: Diagnosis not present

## 2022-04-26 DIAGNOSIS — E785 Hyperlipidemia, unspecified: Secondary | ICD-10-CM | POA: Diagnosis not present

## 2022-04-26 LAB — BASIC METABOLIC PANEL
BUN: 21 mg/dL (ref 6–23)
CO2: 22 mEq/L (ref 19–32)
Calcium: 9.6 mg/dL (ref 8.4–10.5)
Chloride: 103 mEq/L (ref 96–112)
Creatinine, Ser: 1.01 mg/dL (ref 0.40–1.50)
GFR: 76.51 mL/min (ref >60.00)
Glucose, Bld: 109 mg/dL — ABNORMAL HIGH (ref 70–99)
Potassium: 4.6 mEq/L (ref 3.5–5.1)
Sodium: 135 mEq/L (ref 135–145)

## 2022-04-26 LAB — LIPID PANEL
Cholesterol: 178 mg/dL (ref 0–200)
HDL: 39.7 mg/dL (ref >39.00)
NonHDL: 138.04
Total CHOL/HDL Ratio: 4
Triglycerides: 224 mg/dL — ABNORMAL HIGH (ref 0.0–149.0)
VLDL: 44.8 mg/dL — ABNORMAL HIGH (ref 0.0–40.0)

## 2022-04-26 LAB — HEMOGLOBIN A1C: Hgb A1c MFr Bld: 7.2 % — ABNORMAL HIGH (ref 4.6–6.5)

## 2022-04-26 LAB — LDL CHOLESTEROL, DIRECT: Direct LDL: 117 mg/dL

## 2022-04-26 MED ORDER — STIOLTO RESPIMAT 2.5-2.5 MCG/ACT IN AERS: 2.0000 | INHALATION_SPRAY | Freq: Every day | RESPIRATORY_TRACT | 1 refills | Status: DC

## 2022-04-26 MED ORDER — OLMESARTAN MEDOXOMIL 40 MG PO TABS: 40.0000 mg | ORAL_TABLET | Freq: Every day | ORAL | 1 refills | Status: DC

## 2022-04-26 NOTE — Progress Notes (Signed)
Subjective:  Patient ID: [name], male    DOB: January 02, 1956  Age: 66 y.o. MRN: [id number]  CC: Hypertension and Diabetes   HPI WILSON DUSENBERY presents for f/up -  A few months ago he had an episode when his systolic blood pressure spiked up to 200.  He had a brief episode of chest pain and shortness of breath.  He took hydrochlorothiazide and the blood pressure has now normalized.  He is no longer taking hydrochlorothiazide and is only taking 50% of the ARB dose.  He will have some shortness of breath when he gardens in the heat but that is remedied by using oxygen.  He denies diaphoresis, dizziness, lightheadedness, or edema.  Outpatient Medications Prior to Visit  Medication Sig Dispense Refill   aspirin 81 MG EC tablet Take 81 mg by mouth daily.     Blood Glucose Monitoring Suppl (ONETOUCH VERIO) w/Device KIT 1 Act by Does not apply route in the morning and at bedtime. 1 kit 2   CVS D3 50 MCG (2000 UT) CAPS TAKE 2 CAPSULES (4,000 UNITS TOTAL) BY MOUTH DAILY. 180 capsule 1   ezetimibe (ZETIA) 10 MG tablet TAKE 1 TABLET BY MOUTH EVERY DAY 90 tablet 3   famotidine (PEPCID) 40 MG tablet TAKE 1 TABLET BY MOUTH EVERY DAY (Patient taking differently: Take 40 mg by mouth daily as needed.) 90 tablet 1   fexofenadine (ALLEGRA) 180 MG tablet Take 180 mg by mouth daily as needed for allergies.     glucose blood (ONETOUCH VERIO) test strip 1 each by Other route in the morning and at bedtime. Use as instructed 200 each 3   KLOR-CON M20 20 MEQ tablet TAKE 1 TABLET BY MOUTH TWICE A DAY 180 tablet 0   KRILL OIL PO Take 400 mg by mouth daily.     Lancets (ONETOUCH ULTRASOFT) lancets      metFORMIN (GLUCOPHAGE) 1000 MG tablet TAKE 1 TABLET (1,000 MG TOTAL) BY MOUTH TWICE A DAY WITH FOOD 180 tablet 0   NON FORMULARY Oxygen continuous 1.5 liters with activity     ONETOUCH ULTRA test strip USE TO TEST UP TO 4 TIMES DAILY AS DIRECTED 100 strip 5   ONETOUCH VERIO test strip USE 1 PER DAY 100 each 2    hydrochlorothiazide (MICROZIDE) 12.5 MG capsule Take 12.5 mg by mouth daily.     olmesartan (BENICAR) 40 MG tablet TAKE 1 TABLET BY MOUTH EVERY DAY (Patient taking differently: Take 20 mg by mouth daily.) 90 tablet 1   Semaglutide (RYBELSUS) 3 MG TABS Take 3 mg by mouth daily. 30 tablet 0   Tiotropium Bromide-Olodaterol (STIOLTO RESPIMAT) 2.5-2.5 MCG/ACT AERS Inhale 2 puffs into the lungs daily. 12 g 1   No facility-administered medications prior to visit.    ROS Review of Systems  Constitutional: Negative.  Negative for chills, diaphoresis, fatigue and fever.  HENT: Negative.    Respiratory:  Positive for shortness of breath. Negative for chest tightness and wheezing.   Cardiovascular:  Negative for chest pain, palpitations and leg swelling.  Gastrointestinal:  Negative for abdominal pain and diarrhea.  Endocrine: Negative.   Genitourinary: Negative.  Negative for difficulty urinating.  Musculoskeletal:  Negative for arthralgias and myalgias.  Skin: Negative.   Neurological: Negative.  Negative for dizziness, weakness and light-headedness.  Hematological:  Negative for adenopathy. Does not bruise/bleed easily.  Psychiatric/Behavioral: Negative.      Objective:  BP 138/86 (BP Location: Right Arm, Patient Position: Sitting, Cuff Size: Large)  Pulse 95   Temp 97.9 F (36.6 C) (Oral)   Resp 20   Ht 5' 7"  (1.702 m)   Wt 215 lb (97.5 kg)   SpO2 98%   BMI 33.67 kg/m   BP Readings from Last 3 Encounters:  04/26/22 138/86  10/26/21 118/76  07/28/21 122/80    Wt Readings from Last 3 Encounters:  04/26/22 215 lb (97.5 kg)  10/26/21 220 lb (99.8 kg)  07/28/21 219 lb 9.6 oz (99.6 kg)    Physical Exam Vitals reviewed.  Constitutional:      Appearance: He is not ill-appearing.  HENT:     Nose: Nose normal.     Mouth/Throat:     Mouth: Mucous membranes are moist.  Eyes:     General: No scleral icterus.    Conjunctiva/sclera: Conjunctivae normal.  Cardiovascular:     Rate  and Rhythm: Normal rate and regular rhythm.     Heart sounds: No murmur heard.    Comments: EKG- NSR, 72 bpm Normal EKG Pulmonary:     Effort: Pulmonary effort is normal.     Breath sounds: No stridor. No wheezing, rhonchi or rales.  Abdominal:     General: Abdomen is flat.     Palpations: There is no mass.     Tenderness: There is no abdominal tenderness. There is no guarding.     Hernia: No hernia is present.  Musculoskeletal:        General: Normal range of motion.     Cervical back: Neck supple.     Right lower leg: No edema.     Left lower leg: No edema.  Lymphadenopathy:     Cervical: No cervical adenopathy.  Skin:    General: Skin is warm and dry.  Neurological:     General: No focal deficit present.     Mental Status: He is alert. Mental status is at baseline.  Psychiatric:        Mood and Affect: Mood normal.        Behavior: Behavior normal.     Lab Results  Component Value Date   WBC 8.6 10/26/2021   HGB 14.2 10/26/2021   HCT 41.6 10/26/2021   PLT 244.0 10/26/2021   GLUCOSE 109 (H) 04/26/2022   CHOL 178 04/26/2022   TRIG 224.0 (H) 04/26/2022   HDL 39.70 04/26/2022   LDLDIRECT 117.0 04/26/2022   LDLCALC 35 01/03/2017   ALT 38 10/26/2021   AST 23 10/26/2021   NA 135 04/26/2022   K 4.6 04/26/2022   CL 103 04/26/2022   CREATININE 1.01 04/26/2022   BUN 21 04/26/2022   CO2 22 04/26/2022   TSH 3.24 10/26/2021   PSA 0.58 10/26/2021   INR 1.2 08/04/2008   HGBA1C 7.2 (H) 04/26/2022   MICROALBUR <0.7 10/26/2021    CT ANGIO CHEST AORTA W/CM & OR WO/CM  Result Date: 04/15/2021 CLINICAL DATA:  Interstitial pneumonitis, thoracic aortic aneurysm EXAM: CT ANGIOGRAPHY CHEST WITH CONTRAST TECHNIQUE: Multidetector CT imaging of the chest was performed using the standard protocol during bolus administration of intravenous contrast. Multiplanar CT image reconstructions and MIPs were obtained to evaluate the vascular anatomy. CONTRAST:  145m OMNIPAQUE IOHEXOL 350 MG/ML  SOLN COMPARISON:  04/15/2020 FINDINGS: Cardiovascular: Similar atherosclerosis of the thoracic aorta. Negative for dissection. Mild fusiform aneurysmal dilatation of the ascending thoracic aorta, maximal diameter 4 cm, previously 3.6 cm. Patent 3 vessel arch anatomy. Central pulmonary arteries are normal in caliber and patent. No significant filling defect or pulmonary embolus. Normal  heart size.  No pericardial effusion. Central venous structures appear patent.  No veno-occlusive process. Mediastinum/Nodes: Thyroid unremarkable. Trachea and central airways are patent. Esophagus nondilated. No hiatal hernia. Similar scattered small nonenlarged mediastinal and hilar lymph nodes. No bulky adenopathy. Lungs/Pleura: Similar pattern of mixed paraseptal and centrilobular emphysema with scattered areas of subpleural blebs bilaterally. Minimal posteromedial subpleural bandlike nodular scarring in the right upper lobe, slightly worse. Chronic posterolateral pleural thickening with pleural fat proliferation, unchanged. No pleural effusion or pneumothorax. No superimposed acute airspace process, collapse or consolidation. Upper Abdomen: No acute upper abdominal finding. Musculoskeletal: No acute osseous finding. Review of the MIP images confirms the above findings. IMPRESSION: Similar mild aneurysmal dilatation of the ascending thoracic aorta, maximal diameter 4 cm. Recommend annual imaging followup by CTA or MRA. This recommendation follows 2010 ACCF/AHA/AATS/ACR/ASA/SCA/SCAI/SIR/STS/SVM Guidelines for the Diagnosis and Management of Patients with Thoracic Aortic Disease. Circulation. 2010; 121: V616-W737. Aortic aneurysm NOS (ICD10-I71.9) No other acute intrathoracic vascular finding. Negative for acute pulmonary embolus. No dissection. Stable severe mixed paraseptal and centrilobular emphysema pattern with pleuroparenchymal scarring. Minimal increased right upper lobe posteromedial subpleural mild nodular scarring noted. No  superimposed acute airspace process. Aortic Atherosclerosis (ICD10-I70.0) and Emphysema (ICD10-J43.9). Electronically Signed   By: Jerilynn Mages.  Shick M.D.   On: 04/15/2021 08:36    Assessment & Plan:   Sherlock was seen today for hypertension and diabetes.  Diagnoses and all orders for this visit:  Steroid-induced myopathy- He is not willing to take a statin.  Essential hypertension, benign- His blood pressure is adequately well controlled. -     Basic metabolic panel; Future -     olmesartan (BENICAR) 40 MG tablet; Take 1 tablet (40 mg total) by mouth daily. -     EKG 12-Lead -     Basic metabolic panel  Type II diabetes mellitus with manifestations ([place])- His blood sugar is adequately well controlled. -     Basic metabolic panel; Future -     Hemoglobin A1c; Future -     olmesartan (BENICAR) 40 MG tablet; Take 1 tablet (40 mg total) by mouth daily. -     Hemoglobin A1c -     Basic metabolic panel  Hyperlipidemia with target LDL less than 130 -     Lipid panel; Future -     Lipid panel  Chronic respiratory failure with hypoxia (HCC) -     Tiotropium Bromide-Olodaterol (STIOLTO RESPIMAT) 2.5-2.5 MCG/ACT AERS; Inhale 2 puffs into the lungs daily.  Postinflammatory pulmonary fibrosis (HCC) -     Tiotropium Bromide-Olodaterol (STIOLTO RESPIMAT) 2.5-2.5 MCG/ACT AERS; Inhale 2 puffs into the lungs daily.  EMPHYSEMA -     Tiotropium Bromide-Olodaterol (STIOLTO RESPIMAT) 2.5-2.5 MCG/ACT AERS; Inhale 2 puffs into the lungs daily.  Other orders -     Pneumococcal polysaccharide vaccine 23-valent greater than or equal to 2yo subcutaneous/IM   I have discontinued Kelly Splinter. Montanaro's Rybelsus and hydrochlorothiazide. I have also changed his olmesartan. Additionally, I am having him maintain his aspirin EC, NON FORMULARY, fexofenadine, OneTouch Verio, CVS D3, onetouch ultrasoft, OneTouch Verio, OneTouch Verio, Klor-Con M20, ezetimibe, famotidine, KRILL OIL PO, OneTouch Ultra, metFORMIN, and Stiolto  Respimat.  Meds ordered this encounter  Medications   olmesartan (BENICAR) 40 MG tablet    Sig: Take 1 tablet (40 mg total) by mouth daily.    Dispense:  90 tablet    Refill:  1   Tiotropium Bromide-Olodaterol (STIOLTO RESPIMAT) 2.5-2.5 MCG/ACT AERS    Sig: Inhale 2 puffs into the  lungs daily.    Dispense:  12 g    Refill:  1     Follow-up: Return in about 6 months (around 10/27/2022).  Scarlette Calico, MD

## 2022-04-26 NOTE — Patient Instructions (Signed)

## 2022-04-28 ENCOUNTER — Encounter: Payer: Self-pay | Admitting: Internal Medicine

## 2022-04-28 ENCOUNTER — Ambulatory Visit: Payer: Medicare Other | Admitting: *Deleted

## 2022-04-28 DIAGNOSIS — J9611 Chronic respiratory failure with hypoxia: Secondary | ICD-10-CM

## 2022-04-28 DIAGNOSIS — I10 Essential (primary) hypertension: Secondary | ICD-10-CM

## 2022-04-28 DIAGNOSIS — E118 Type 2 diabetes mellitus with unspecified complications: Secondary | ICD-10-CM

## 2022-04-28 NOTE — Chronic Care Management (AMB) (Signed)
 Care Management    RN Visit Note  04/28/2022 Name: [name] MRN: [id number] DOB: 10/22/1955  Subjective: [name] is a 66 y.o. year old male who is a primary care patient of Jones, Thomas L, MD. The care management team was consulted for assistance with disease management and care coordination needs.    Engaged with patient by telephone for follow up visit/ Clinic RN CM case closure in response to provider referral for case management and/or care coordination services.   Consent to Services:   [name] was given information about Care Management services 06/18/20 including:  Care Management services includes personalized support from designated clinical staff supervised by his physician, including individualized plan of care and coordination with other care providers 24/7 contact phone numbers for assistance for urgent and routine care needs. The patient may stop case management services at any time by phone call to the office staff.  Patient agreed to services and consent obtained.   Assessment: Review of patient past medical history, allergies, medications, health status, including review of consultants reports, laboratory and other test data, was performed as part of comprehensive evaluation and provision of chronic care management services.   SDOH (Social Determinants of Health) assessments and interventions performed:  SDOH Interventions    Flowsheet Row Most Recent Value  SDOH Interventions   Food Insecurity Interventions Intervention Not Indicated  [coninues to deny food insecurity]  Transportation Interventions Intervention Not Indicated  [continues to drive self]     Care Plan  Allergies  Allergen Reactions   Latex    Morphine    Statins     REACTION: weakness, muscle pains   Outpatient Encounter Medications as of 04/28/2022  Medication Sig   aspirin 81 MG EC tablet Take 81 mg by mouth daily.   Blood Glucose Monitoring Suppl (ONETOUCH VERIO) w/Device KIT 1  Act by Does not apply route in the morning and at bedtime.   CVS D3 50 MCG (2000 UT) CAPS TAKE 2 CAPSULES (4,000 UNITS TOTAL) BY MOUTH DAILY.   ezetimibe (ZETIA) 10 MG tablet TAKE 1 TABLET BY MOUTH EVERY DAY   famotidine (PEPCID) 40 MG tablet TAKE 1 TABLET BY MOUTH EVERY DAY (Patient taking differently: Take 40 mg by mouth daily as needed.)   fexofenadine (ALLEGRA) 180 MG tablet Take 180 mg by mouth daily as needed for allergies.   glucose blood (ONETOUCH VERIO) test strip 1 each by Other route in the morning and at bedtime. Use as instructed   KLOR-CON M20 20 MEQ tablet TAKE 1 TABLET BY MOUTH TWICE A DAY   KRILL OIL PO Take 400 mg by mouth daily.   Lancets (ONETOUCH ULTRASOFT) lancets    metFORMIN (GLUCOPHAGE) 1000 MG tablet TAKE 1 TABLET (1,000 MG TOTAL) BY MOUTH TWICE A DAY WITH FOOD   NON FORMULARY Oxygen continuous 1.5 liters with activity   olmesartan (BENICAR) 40 MG tablet Take 1 tablet (40 mg total) by mouth daily.   ONETOUCH ULTRA test strip USE TO TEST UP TO 4 TIMES DAILY AS DIRECTED   ONETOUCH VERIO test strip USE 1 PER DAY   Tiotropium Bromide-Olodaterol (STIOLTO RESPIMAT) 2.5-2.5 MCG/ACT AERS Inhale 2 puffs into the lungs daily.   No facility-administered encounter medications on file as of 04/28/2022.   Patient Active Problem List   Diagnosis Date Noted   Steroid-induced myopathy 04/26/2022   Encounter for general adult medical examination with abnormal findings 10/26/2020   Vitamin D deficiency disease 06/12/2018   GERD with esophagitis   12/12/2017   Thoracic aortic aneurysm without rupture ([place]) 12/12/2017   Screen for colon cancer 01/12/2016   Routine general medical examination at a health care facility 07/16/2015   Benign prostatic hyperplasia 07/13/2014   Type II diabetes mellitus with manifestations ([place]) 11/12/2013   Pure hyperglyceridemia 07/09/2013   Upper airway cough syndrome 02/06/2012   Hyperlipidemia with target LDL less than 130 07/05/2011   EMPHYSEMA  12/07/2008   Essential hypertension, benign 08/26/2008   INTERSTITIAL LUNG DISEASE 08/26/2008   Respiratory failure with hypoxia ([place]) 08/26/2008   Conditions to be addressed/monitored: HTN, COPD, and DMII  Care Plan : RN Care Manager Plan of Care  Updates made by Knox Royalty, RN since 04/28/2022 12:00 AM     Problem: Chronic Disease Management Needs   Priority: High     Long-Range Goal: Improvement in adherence to established plan of care for long term chronic disease management   Start Date: 11/16/2021  Expected End Date: 11/16/2022  Priority: High  Note:   Current Barriers:  Chronic Disease Management support and education needs related to COPD and DMII 12/28/21: Ongoing non-adherence in following healthcare provider advice and recommendations: primarily with medications and diet  RNCM Clinical Goal(s):  Patient will demonstrate ongoing health management independence as evidenced by adherence to plan of care for DMII; COPD        through collaboration with RN Care manager, provider, and care team.  Medication adherence issues: CCM Pharmacy team active  Interventions: 1:1 collaboration with primary care provider regarding development and update of comprehensive plan of care as evidenced by provider attestation and co-signature Inter-disciplinary care team collaboration (see longitudinal plan of care) Evaluation of current treatment plan related to  self management and patient's adherence to plan as established by provider Initial Assessment completed 11/16/21 Review of patient status, including review of consultants reports, relevant laboratory and other test results, and medications completed Pain assessment updated: denies pain today Reviewed recent PCP office visit  Medications discussed:  today he denies current concerns/ issues/ questions around medications; endorses adherence to taking all medications as prescribed Reviewed upcoming scheduled provider appointments: 04/26/22-  PCP; patient verbalizes good understanding of post-visit questions Confirmed patient is aware of and has plans to attend upcoming scheduled CT scan 05/03/22 Discussed plans with patient for ongoing care management follow up- patient denies current care coordination/ care management needs and is agreeable to CCM RN CM case closure today; verbalizes understanding to contact PCP or other care providers for any needs that arise in the future, and confirms he has contact information for all care providers     COPD/ HTN/: (Status: 04/28/22: Goal Met.) Long Term Goal   Advised patient to self assesses COPD action plan zone and make appointment with provider if in the yellow zone for 48 hours without improvement Advised patient to engage in light exercise as tolerated 3-5 days a week to aid in the the management of COPD Discussed the importance of adequate rest and management of fatigue with COPD Reports continues using home O2 prn- reports using "about like I always do;" clarified with patient using "every couple of days for short periods of time;" continues using 1-3 [name]/min Confirmed with patient no current clinical concerns around breathing status-- he continues to report that he uses maintenance inhaler as a rescue inhaler; reports using "about 2-3 times per week;" confirms that he shared with PCP his practice in using inhaler Reinforced previously provided education around action plan for episodes shortness of breath: patient will  require ongoing reinforcement and support around same Reinforced current action plan for COPD flares: patient has good understanding of same; starts with rest/ cessation of activity, use of maintenance inhaler as a rescue inhaler, home O2 use Confirms patient continues monitoring and recording blood pressures at home: he reports "all have been much better" since medications were adjusted 04/26/22- does not have specific values from home monitoring to review   Diabetes:  (Status:  04/28/22: Goal Met.) Long Term Goal   Lab Results  Component Value Date   HGBA1C 7.2 (H) 04/26/2022    Reviewed prescribed diet with patient low sugar/ carbohydrate modified; heart healthy ; encouraged patient to adhere to recommended dietary strategies for blood sugar control Confirmed patient continues to report "tries" to follow prescribed diet, but is still not "strictly" following He reports general blood sugars fasting/ post-prandial between "130-160's;" reports fasting blood sugar this morning of "128" Confirmed no recent low blood sugar values < 70: reinforced previously provided education around signs/ symptoms low blood sugar and corresponding action plan Reminded patient to schedule annual eye exam, due October 2023 Confirmed patient has been increasing his daily activity now that warmer weather has come- he is gardening and doing yard work for activity- encouraged hime to pace himself and not over-do as weather continues to get warmer- he reports today tolerating activity and garden work "well so far this summer"     Plan:  No further follow up required: patient denies current care coordination/ care management needs and is agreeable to Clinic RN CM case closure today; case closure accordingly     Oneta Rack, RN, BSN, [place] [phone]: direct office

## 2022-05-03 ENCOUNTER — Other Ambulatory Visit: Payer: Self-pay | Admitting: Internal Medicine

## 2022-05-03 ENCOUNTER — Ambulatory Visit
Admission: RE | Admit: 2022-05-03 | Discharge: 2022-05-03 | Disposition: A | Discharge status: Home / Self Care | Payer: Medicare Other | Source: Ambulatory Visit | Attending: Internal Medicine | Admitting: Internal Medicine

## 2022-05-03 DIAGNOSIS — I7121 Aneurysm of the ascending aorta, without rupture: Secondary | ICD-10-CM

## 2022-05-03 DIAGNOSIS — R918 Other nonspecific abnormal finding of lung field: Secondary | ICD-10-CM | POA: Insufficient documentation

## 2022-05-03 DIAGNOSIS — I712 Thoracic aortic aneurysm, without rupture, unspecified: Secondary | ICD-10-CM | POA: Diagnosis not present

## 2022-05-03 DIAGNOSIS — I700 Atherosclerosis of aorta: Secondary | ICD-10-CM | POA: Diagnosis not present

## 2022-05-03 DIAGNOSIS — J439 Emphysema, unspecified: Secondary | ICD-10-CM | POA: Diagnosis not present

## 2022-05-03 MED ORDER — IOPAMIDOL (ISOVUE-370) INJECTION 76%
75.0000 mL | Freq: Once | INTRAVENOUS | Status: AC | PRN
  Administered 2022-05-03: 75 mL via INTRAVENOUS

## 2022-05-25 ENCOUNTER — Encounter: Payer: Self-pay | Admitting: Pulmonary Disease

## 2022-05-25 ENCOUNTER — Ambulatory Visit: Payer: Medicare Other | Admitting: Pulmonary Disease

## 2022-05-25 VITALS — BP 124/72 | HR 71 | Ht 69.0 in | Wt 216.2 lb

## 2022-05-25 DIAGNOSIS — J432 Centrilobular emphysema: Secondary | ICD-10-CM

## 2022-05-25 DIAGNOSIS — J438 Other emphysema: Secondary | ICD-10-CM

## 2022-05-25 DIAGNOSIS — R911 Solitary pulmonary nodule: Secondary | ICD-10-CM | POA: Insufficient documentation

## 2022-05-25 NOTE — H&P (View-Only) (Signed)
Synopsis: Referred in August 2023 for lung mass by Eric Lima, MD  Subjective:   PATIENT ID: [name] GENDER: male DOB: 1956-04-13, MRN: [id number]  Chief Complaint  Patient presents with   Consult    Pt is here today due to recent imaging. Pt states that he does have occasional SOB and also will have an occasional cough.    This is a 66 year old gentleman, past medical history of hyperlipidemia, hypertension, COPD, interstitial lung disease, longstanding history of tobacco abuse, quit in 2005.  Approximately 16-pack-year history of smoking.Patient had a CT scan of the chest on 05/03/2022 CT angiography showed interval development of a 15 x 10 mm irregular density in the right upper lobe concerning for malignancy.  This was completed by Dr. Ronnald Ramp and patient was referred for evaluation.  He had usually had the CT imaging completed on a yearly basis for follow-up in a ascending thoracic aneurysm.  This ascending thoracic aneurysm measures 4 cm and has been stable for some time.  However from his previous scan to his new scan he now has a new lung nodule concerning for malignancy.  Patient used to follow with Dr. Halford Chessman but has not been seen in our clinic in greater than 5 years.  Here to establish care with new primary pulmonary provider.    Past Medical History:  Diagnosis Date   COPD (chronic obstructive pulmonary disease) ([place]) 09/23/08   -PFT 09/23/08 FEV1 2.89(84%), FVC 3.70 (78%), TLC 5.28 (78%), DLCO 64% mixed obstructive and  restrictive pattern.  Spirometry 03/11/09 FEV 3.35 (97%),FVC 4.10 (86%), FEV% 82. Steroid responsive pneumonitis 04/09- required mechanical ventilation during hospitalization - The following hospital test were negative: PCP,ANA,HIV,BAL. - ACE25,RF20,CRP 28,A1AT 263, ESR 109   Depression    Diabetes mellitus without complication (HCC)    Dyslipidemia    Dyslipidemia    GERD (gastroesophageal reflux disease)    History of skin cancer    Hypertension     Echo 10/09 EF 60%   ILD (interstitial lung disease) ([place])    Panic attack      Family History  Problem Relation Age of Onset   Heart attack Father 66   Diabetes Father    Heart attack Brother    Emphysema Brother    Diabetes Brother      Past Surgical History:  Procedure Laterality Date   BRONCHOSCOPY  10/09   Ganglion cyst on foot     HERNIA REPAIR     2 hernia   Left shoulder surgery      Social History   Socioeconomic History   Marital status: Divorced    Spouse name: Not on file   Number of children: 0   Years of education: 10th grade returned for GED   Highest education level: GED or equivalent  Occupational History   Occupation: Scientist, water quality: UNEMPLOYED    Comment: [employer]  Tobacco Use   Smoking status: Former    Packs/day: 1.00    Years: 16.00    Total pack years: 16.00    Types: Cigarettes    Quit date: 10/17/2003    Years since quitting: 18.6    Passive exposure: Past   Smokeless tobacco: Never  Vaping Use   Vaping Use: Never used  Substance and Sexual Activity   Alcohol use: No    Comment: Rare beer 3-4 times per year   Drug use: No   Sexual activity: Not Currently  Other Topics Concern  Not on file  Social History Narrative   Divorced. 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    Social Determinants of Health   Financial Resource Strain: Low Risk  (11/16/2021)   Overall Financial Resource Strain (CARDIA)    Difficulty of Paying Living Expenses: Not very hard  Food Insecurity: No Food Insecurity (04/28/2022)   Hunger Vital Sign    Worried About Running Out of Food in the Last Year: Never true    Ran Out of Food in the Last Year: Never true  Transportation Needs: No  Transportation Needs (04/28/2022)   PRAPARE - Hydrologist (Medical): No    Lack of Transportation (Non-Medical): No  Physical Activity: Sufficiently Active (04/22/2020)   Exercise Vital Sign    Days of Exercise per Week: 5 days    Minutes of Exercise per Session: 30 min  Stress: No Stress Concern Present (10/27/2021)   [place]    Feeling of Stress : Only a little  Social Connections: Moderately Isolated (10/27/2021)   Social Connection and Isolation Panel [NHANES]    Frequency of Communication with Friends and Family: Three times a week    Frequency of Social Gatherings with Friends and Family: Once a week    Attends Religious Services: 1 to 4 times per year    Active Member of [employer] or Organizations: No    Attends Archivist Meetings: Never    Marital Status: Divorced  Human resources officer Violence: Not At Risk (10/27/2021)   Humiliation, Afraid, Rape, and Kick questionnaire    Fear of Current or Ex-Partner: No    Emotionally Abused: No    Physically Abused: No    Sexually Abused: No     Allergies  Allergen Reactions   Latex    Morphine    Statins     REACTION: weakness, muscle pains     Outpatient Medications Prior to Visit  Medication Sig Dispense Refill   aspirin 81 MG EC tablet Take 81 mg by mouth daily.     Blood Glucose Monitoring Suppl (ONETOUCH VERIO) w/Device KIT 1 Act by Does not apply route in the morning and at bedtime. 1 kit 2   [facility] D3 50 MCG (2000 UT) CAPS TAKE 2 CAPSULES (4,000 UNITS TOTAL) BY MOUTH DAILY. 180 capsule 1   ezetimibe (ZETIA) 10 MG tablet TAKE 1 TABLET BY MOUTH EVERY DAY 90 tablet 3   famotidine (PEPCID) 40 MG tablet TAKE 1 TABLET BY MOUTH EVERY DAY (Patient taking differently: Take 40 mg by mouth daily as needed.) 90 tablet 1   fexofenadine (ALLEGRA) 180 MG tablet Take 180 mg by mouth daily as needed for allergies.     glucose blood (ONETOUCH  VERIO) test strip 1 each by Other route in the morning and at bedtime. Use as instructed 200 each 3   KRILL OIL PO Take 400 mg by mouth daily.     Lancets (ONETOUCH ULTRASOFT) lancets      metFORMIN (GLUCOPHAGE) 1000 MG tablet TAKE 1 TABLET (1,000 MG TOTAL) BY MOUTH TWICE A DAY WITH FOOD 180 tablet 0   NON FORMULARY Oxygen continuous 1.5  liters with activity     olmesartan (BENICAR) 40 MG tablet Take 1 tablet (40 mg total) by mouth daily. 90 tablet 1   ONETOUCH ULTRA test strip USE TO TEST UP TO 4 TIMES DAILY AS DIRECTED 100 strip 5   ONETOUCH VERIO test strip USE 1 PER DAY 100 each 2   Tiotropium Bromide-Olodaterol (STIOLTO RESPIMAT) 2.5-2.5 MCG/ACT AERS Inhale 2 puffs into the lungs daily. 12 g 1   KLOR-CON M20 20 MEQ tablet TAKE 1 TABLET BY MOUTH TWICE A DAY (Patient not taking: Reported on 05/25/2022) 180 tablet 0   No facility-administered medications prior to visit.    Review of Systems  Constitutional:  Negative for chills, fever, malaise/fatigue and weight loss.  HENT:  Negative for hearing loss, sore throat and tinnitus.   Eyes:  Negative for blurred vision and double vision.  Respiratory:  Negative for cough, hemoptysis, sputum production, shortness of breath, wheezing and stridor.   Cardiovascular:  Negative for chest pain, palpitations, orthopnea, leg swelling and PND.  Gastrointestinal:  Negative for abdominal pain, constipation, diarrhea, heartburn, nausea and vomiting.  Genitourinary:  Negative for dysuria, hematuria and urgency.  Musculoskeletal:  Negative for joint pain and myalgias.  Skin:  Negative for itching and rash.  Neurological:  Negative for dizziness, tingling, weakness and headaches.  Endo/Heme/Allergies:  Negative for environmental allergies. Does not bruise/bleed easily.  Psychiatric/Behavioral:  Negative for depression. The patient is not nervous/anxious and does not have insomnia.   All other systems reviewed and are negative.    Objective:  Physical  Exam Vitals reviewed.  Constitutional:      General: He is not in acute distress.    Appearance: He is well-developed.  HENT:     Head: Normocephalic and atraumatic.  Eyes:     General: No scleral icterus.    Conjunctiva/sclera: Conjunctivae normal.     Pupils: Pupils are equal, round, and reactive to light.  Neck:     Vascular: No JVD.     Trachea: No tracheal deviation.  Cardiovascular:     Rate and Rhythm: Normal rate and regular rhythm.     Heart sounds: Normal heart sounds. No murmur heard. Pulmonary:     Effort: Pulmonary effort is normal. No tachypnea, accessory muscle usage or respiratory distress.     Breath sounds: No stridor. No wheezing, rhonchi or rales.  Abdominal:     General: There is no distension.     Palpations: Abdomen is soft.     Tenderness: There is no abdominal tenderness.  Musculoskeletal:        General: No tenderness.     Cervical back: Neck supple.  Lymphadenopathy:     Cervical: No cervical adenopathy.  Skin:    General: Skin is warm and dry.     Capillary Refill: Capillary refill takes less than 2 seconds.     Findings: No rash.  Neurological:     Mental Status: He is alert and oriented to person, place, and time.  Psychiatric:        Behavior: Behavior normal.      Vitals:   05/25/22 1054  BP: 124/72  Pulse: 71  SpO2: 97%  Weight: 216 lb 3.2 oz (98.1 kg)  Height: _0  (1.753 m)   97% on RA BMI Readings from Last 3 Encounters:  05/25/22 31.93 kg/m  04/26/22 33.67 kg/m  10/26/21 34.46 kg/m   Wt Readings from Last 3 Encounters:  05/25/22 216 lb 3.2 oz (98.1 kg)  04/26/22 215 lb (  97.5 kg)  10/26/21 220 lb (99.8 kg)     CBC    Component Value Date/Time   WBC 8.6 10/26/2021 1151   RBC 4.67 10/26/2021 1151   HGB 14.2 10/26/2021 1151   HCT 41.6 10/26/2021 1151   PLT 244.0 10/26/2021 1151   MCV 89.2 10/26/2021 1151   MCHC 34.0 10/26/2021 1151   RDW 13.7 10/26/2021 1151   LYMPHSABS 2.2 10/26/2021 1151   MONOABS 1.1 (H)  10/26/2021 1151   EOSABS 0.4 10/26/2021 1151   BASOSABS 0.2 (H) 10/26/2021 1151    Chest Imaging:  May 04, 2022 CT chest: 15 mm right upper lobe pulmonary nodule, severe paraseptal and centrilobular emphysema. The patient's images have been independently reviewed by me.    Pulmonary Functions Testing Results:    Latest Ref Rng & Units 07/01/2018    1:51 PM 03/12/2014   12:54 PM  PFT Results  FVC-Pre L 4.62  4.52   FVC-Predicted Pre % 111  102   FVC-Post L 4.58  4.40   FVC-Predicted Post % 110  99   Pre FEV1/FVC % % 80  81   Post FEV1/FCV % % 80  82   FEV1-Pre L 3.70  3.68   FEV1-Predicted Pre % 119  109   FEV1-Post L 3.69  3.63   DLCO uncorrected ml/min/mmHg 20.33  21.05   DLCO UNC% % 71  71   DLVA Predicted % 81  101   TLC L 6.13    TLC % Predicted % 95    RV % Predicted % 72      FeNO:   Pathology:   Echocardiogram:   Heart Catheterization:     Assessment & Plan:     ICD-10-CM   1. Right upper lobe pulmonary nodule  R91.1     2. Centrilobular emphysema ([place])  J43.2     3. Paraseptal emphysema ([place])  J43.8       Discussion:  This is a 66 year old gentleman with a right upper lobe pulmonary nodule found incidentally on CT imaging that he has annually for his thoracic aneurysm.  He has significant centrilobular and paraseptal emphysema.  A previous history of tobacco abuse.  Plan: We talked about the risk benefits and alternatives of proceeding with tissue biopsy in this particular lesion. The lesion size is suggestive of malignancy especially with association with emphysema. Also the fact that the nodule was not present last year on CT imaging. We will obtain a nuclear medicine PET scan that will be done prior to next appointment and hopefully before his procedure. We will tentatively schedule patient out for the next approximately 2-1/2 weeks for his bronchoscopy and hopefully get his PET scan done prior to that. Patient is agreeable to this plan. If  the PET scan for some reason shows that the lesion has morphologically changed or is not concerning for malignancy we can always reschedule his bronchoscopy or cancel it. But based on the lesion and the morphology and the patient's history the upper lobe lesion is concerning for malignancy and I think we go ahead safely schedule him for procedure. Patient is agreeable to this plan.  Bronchoscopy on 06/13/2022.  We will have a follow-up appointment proximally 1 week after with SG, NP.    Current Outpatient Medications:    aspirin 81 MG EC tablet, Take 81 mg by mouth daily., Disp: , Rfl:    Blood Glucose Monitoring Suppl (ONETOUCH VERIO) w/Device KIT, 1 Act by Does not apply route in the  morning and at bedtime., Disp: 1 kit, Rfl: 2   CVS D3 50 MCG (2000 UT) CAPS, TAKE 2 CAPSULES (4,000 UNITS TOTAL) BY MOUTH DAILY., Disp: 180 capsule, Rfl: 1   ezetimibe (ZETIA) 10 MG tablet, TAKE 1 TABLET BY MOUTH EVERY DAY, Disp: 90 tablet, Rfl: 3   famotidine (PEPCID) 40 MG tablet, TAKE 1 TABLET BY MOUTH EVERY DAY (Patient taking differently: Take 40 mg by mouth daily as needed.), Disp: 90 tablet, Rfl: 1   fexofenadine (ALLEGRA) 180 MG tablet, Take 180 mg by mouth daily as needed for allergies., Disp: , Rfl:    glucose blood (ONETOUCH VERIO) test strip, 1 each by Other route in the morning and at bedtime. Use as instructed, Disp: 200 each, Rfl: 3   KRILL OIL PO, Take 400 mg by mouth daily., Disp: , Rfl:    Lancets (ONETOUCH ULTRASOFT) lancets, , Disp: , Rfl:    metFORMIN (GLUCOPHAGE) 1000 MG tablet, TAKE 1 TABLET (1,000 MG TOTAL) BY MOUTH TWICE A DAY WITH FOOD, Disp: 180 tablet, Rfl: 0   NON FORMULARY, Oxygen continuous 1.5 liters with activity, Disp: , Rfl:    olmesartan (BENICAR) 40 MG tablet, Take 1 tablet (40 mg total) by mouth daily., Disp: 90 tablet, Rfl: 1   ONETOUCH ULTRA test strip, USE TO TEST UP TO 4 TIMES DAILY AS DIRECTED, Disp: 100 strip, Rfl: 5   ONETOUCH VERIO test strip, USE 1 PER DAY, Disp:  100 each, Rfl: 2   Tiotropium Bromide-Olodaterol (STIOLTO RESPIMAT) 2.5-2.5 MCG/ACT AERS, Inhale 2 puffs into the lungs daily., Disp: 12 g, Rfl: 1   KLOR-CON M20 20 MEQ tablet, TAKE 1 TABLET BY MOUTH TWICE A DAY (Patient not taking: Reported on 05/25/2022), Disp: 180 tablet, Rfl: 0    Garner Nash, DO [facility] 05/25/2022 11:40 AM

## 2022-05-25 NOTE — Progress Notes (Signed)
Synopsis: Referred in August 2023 for lung mass by [name], MD  Subjective:   PATIENT ID: [name] GENDER: male DOB: October 23, 1955, MRN: [id number]  Chief Complaint  Patient presents with   Consult    Pt is here today due to recent imaging. Pt states that he does have occasional SOB and also will have an occasional cough.    This is a 66 year old gentleman, past medical history of hyperlipidemia, hypertension, COPD, interstitial lung disease, longstanding history of tobacco abuse, quit in 2005.  Approximately 16-pack-year history of smoking.Patient had a CT scan of the chest on 05/03/2022 CT angiography showed interval development of a 15 x 10 mm irregular density in the right upper lobe concerning for malignancy.  This was completed by Dr. Ronnald Ramp and patient was referred for evaluation.  He had usually had the CT imaging completed on a yearly basis for follow-up in a ascending thoracic aneurysm.  This ascending thoracic aneurysm measures 4 cm and has been stable for some time.  However from his previous scan to his new scan he now has a new lung nodule concerning for malignancy.  Patient used to follow with Dr. Halford Chessman but has not been seen in our clinic in greater than 5 years.  Here to establish care with new primary pulmonary provider.    Past Medical History:  Diagnosis Date   COPD (chronic obstructive pulmonary disease) ([place]) 09/23/08   -PFT 09/23/08 FEV1 2.89(84%), FVC 3.70 (78%), TLC 5.28 (78%), DLCO 64% mixed obstructive and  restrictive pattern.  Spirometry 03/11/09 FEV 3.35 (97%),FVC 4.10 (86%), FEV% 82. Steroid responsive pneumonitis 04/09- required mechanical ventilation during hospitalization - The following hospital test were negative: PCP,ANA,HIV,BAL. - ACE25,RF20,CRP 28,A1AT 263, ESR 109   Depression    Diabetes mellitus without complication (HCC)    Dyslipidemia    Dyslipidemia    GERD (gastroesophageal reflux disease)    History of skin cancer    Hypertension     Echo 10/09 EF 60%   ILD (interstitial lung disease) ([place])    Panic attack      Family History  Problem Relation Age of Onset   Heart attack Father 67   Diabetes Father    Heart attack Brother    Emphysema Brother    Diabetes Brother      Past Surgical History:  Procedure Laterality Date   BRONCHOSCOPY  10/09   Ganglion cyst on foot     HERNIA REPAIR     2 hernia   Left shoulder surgery      Social History   Socioeconomic History   Marital status: Divorced    Spouse name: Not on file   Number of children: 0   Years of education: 10th grade returned for GED   Highest education level: GED or equivalent  Occupational History   Occupation: Scientist, water quality: UNEMPLOYED    Comment: [employer]  Tobacco Use   Smoking status: Former    Packs/day: 1.00    Years: 16.00    Total pack years: 16.00    Types: Cigarettes    Quit date: 10/17/2003    Years since quitting: 18.6    Passive exposure: Past   Smokeless tobacco: Never  Vaping Use   Vaping Use: Never used  Substance and Sexual Activity   Alcohol use: No    Comment: Rare beer 3-4 times per year   Drug use: No   Sexual activity: Not Currently  Other Topics Concern  Not on file  Social History Narrative   Divorced. 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    Social Determinants of Health   Financial Resource Strain: Low Risk  (11/16/2021)   Overall Financial Resource Strain (CARDIA)    Difficulty of Paying Living Expenses: Not very hard  Food Insecurity: No Food Insecurity (04/28/2022)   Hunger Vital Sign    Worried About Running Out of Food in the Last Year: Never true    Ran Out of Food in the Last Year: Never true  Transportation Needs: No  Transportation Needs (04/28/2022)   PRAPARE - Hydrologist (Medical): No    Lack of Transportation (Non-Medical): No  Physical Activity: Sufficiently Active (04/22/2020)   Exercise Vital Sign    Days of Exercise per Week: 5 days    Minutes of Exercise per Session: 30 min  Stress: No Stress Concern Present (10/27/2021)   [place]    Feeling of Stress : Only a little  Social Connections: Moderately Isolated (10/27/2021)   Social Connection and Isolation Panel [NHANES]    Frequency of Communication with Friends and Family: Three times a week    Frequency of Social Gatherings with Friends and Family: Once a week    Attends Religious Services: 1 to 4 times per year    Active Member of [employer] or Organizations: No    Attends Archivist Meetings: Never    Marital Status: Divorced  Human resources officer Violence: Not At Risk (10/27/2021)   Humiliation, Afraid, Rape, and Kick questionnaire    Fear of Current or Ex-Partner: No    Emotionally Abused: No    Physically Abused: No    Sexually Abused: No     Allergies  Allergen Reactions   Latex    Morphine    Statins     REACTION: weakness, muscle pains     Outpatient Medications Prior to Visit  Medication Sig Dispense Refill   aspirin 81 MG EC tablet Take 81 mg by mouth daily.     Blood Glucose Monitoring Suppl (ONETOUCH VERIO) w/Device KIT 1 Act by Does not apply route in the morning and at bedtime. 1 kit 2   CVS D3 50 MCG (2000 UT) CAPS TAKE 2 CAPSULES (4,000 UNITS TOTAL) BY MOUTH DAILY. 180 capsule 1   ezetimibe (ZETIA) 10 MG tablet TAKE 1 TABLET BY MOUTH EVERY DAY 90 tablet 3   famotidine (PEPCID) 40 MG tablet TAKE 1 TABLET BY MOUTH EVERY DAY (Patient taking differently: Take 40 mg by mouth daily as needed.) 90 tablet 1   fexofenadine (ALLEGRA) 180 MG tablet Take 180 mg by mouth daily as needed for allergies.     glucose blood (ONETOUCH  VERIO) test strip 1 each by Other route in the morning and at bedtime. Use as instructed 200 each 3   KRILL OIL PO Take 400 mg by mouth daily.     Lancets (ONETOUCH ULTRASOFT) lancets      metFORMIN (GLUCOPHAGE) 1000 MG tablet TAKE 1 TABLET (1,000 MG TOTAL) BY MOUTH TWICE A DAY WITH FOOD 180 tablet 0   NON FORMULARY Oxygen continuous 1.5  liters with activity     olmesartan (BENICAR) 40 MG tablet Take 1 tablet (40 mg total) by mouth daily. 90 tablet 1   ONETOUCH ULTRA test strip USE TO TEST UP TO 4 TIMES DAILY AS DIRECTED 100 strip 5   ONETOUCH VERIO test strip USE 1 PER DAY 100 each 2   Tiotropium Bromide-Olodaterol (STIOLTO RESPIMAT) 2.5-2.5 MCG/ACT AERS Inhale 2 puffs into the lungs daily. 12 g 1   KLOR-CON M20 20 MEQ tablet TAKE 1 TABLET BY MOUTH TWICE A DAY (Patient not taking: Reported on 05/25/2022) 180 tablet 0   No facility-administered medications prior to visit.    Review of Systems  Constitutional:  Negative for chills, fever, malaise/fatigue and weight loss.  HENT:  Negative for hearing loss, sore throat and tinnitus.   Eyes:  Negative for blurred vision and double vision.  Respiratory:  Negative for cough, hemoptysis, sputum production, shortness of breath, wheezing and stridor.   Cardiovascular:  Negative for chest pain, palpitations, orthopnea, leg swelling and PND.  Gastrointestinal:  Negative for abdominal pain, constipation, diarrhea, heartburn, nausea and vomiting.  Genitourinary:  Negative for dysuria, hematuria and urgency.  Musculoskeletal:  Negative for joint pain and myalgias.  Skin:  Negative for itching and rash.  Neurological:  Negative for dizziness, tingling, weakness and headaches.  Endo/Heme/Allergies:  Negative for environmental allergies. Does not bruise/bleed easily.  Psychiatric/Behavioral:  Negative for depression. The patient is not nervous/anxious and does not have insomnia.   All other systems reviewed and are negative.    Objective:  Physical  Exam Vitals reviewed.  Constitutional:      General: He is not in acute distress.    Appearance: He is well-developed.  HENT:     Head: Normocephalic and atraumatic.  Eyes:     General: No scleral icterus.    Conjunctiva/sclera: Conjunctivae normal.     Pupils: Pupils are equal, round, and reactive to light.  Neck:     Vascular: No JVD.     Trachea: No tracheal deviation.  Cardiovascular:     Rate and Rhythm: Normal rate and regular rhythm.     Heart sounds: Normal heart sounds. No murmur heard. Pulmonary:     Effort: Pulmonary effort is normal. No tachypnea, accessory muscle usage or respiratory distress.     Breath sounds: No stridor. No wheezing, rhonchi or rales.  Abdominal:     General: There is no distension.     Palpations: Abdomen is soft.     Tenderness: There is no abdominal tenderness.  Musculoskeletal:        General: No tenderness.     Cervical back: Neck supple.  Lymphadenopathy:     Cervical: No cervical adenopathy.  Skin:    General: Skin is warm and dry.     Capillary Refill: Capillary refill takes less than 2 seconds.     Findings: No rash.  Neurological:     Mental Status: He is alert and oriented to person, place, and time.  Psychiatric:        Behavior: Behavior normal.      Vitals:   05/25/22 1054  BP: 124/72  Pulse: 71  SpO2: 97%  Weight: 216 lb 3.2 oz (98.1 kg)  Height: _0  (1.753 m)   97% on RA BMI Readings from Last 3 Encounters:  05/25/22 31.93 kg/m  04/26/22 33.67 kg/m  10/26/21 34.46 kg/m   Wt Readings from Last 3 Encounters:  05/25/22 216 lb 3.2 oz (98.1 kg)  04/26/22 215 lb (  97.5 kg)  10/26/21 220 lb (99.8 kg)     CBC    Component Value Date/Time   WBC 8.6 10/26/2021 1151   RBC 4.67 10/26/2021 1151   HGB 14.2 10/26/2021 1151   HCT 41.6 10/26/2021 1151   PLT 244.0 10/26/2021 1151   MCV 89.2 10/26/2021 1151   MCHC 34.0 10/26/2021 1151   RDW 13.7 10/26/2021 1151   LYMPHSABS 2.2 10/26/2021 1151   MONOABS 1.1 (H)  10/26/2021 1151   EOSABS 0.4 10/26/2021 1151   BASOSABS 0.2 (H) 10/26/2021 1151    Chest Imaging:  May 04, 2022 CT chest: 15 mm right upper lobe pulmonary nodule, severe paraseptal and centrilobular emphysema. The patient's images have been independently reviewed by me.    Pulmonary Functions Testing Results:    Latest Ref Rng & Units 07/01/2018    1:51 PM 03/12/2014   12:54 PM  PFT Results  FVC-Pre L 4.62  4.52   FVC-Predicted Pre % 111  102   FVC-Post L 4.58  4.40   FVC-Predicted Post % 110  99   Pre FEV1/FVC % % 80  81   Post FEV1/FCV % % 80  82   FEV1-Pre L 3.70  3.68   FEV1-Predicted Pre % 119  109   FEV1-Post L 3.69  3.63   DLCO uncorrected ml/min/mmHg 20.33  21.05   DLCO UNC% % 71  71   DLVA Predicted % 81  101   TLC L 6.13    TLC % Predicted % 95    RV % Predicted % 72      FeNO:   Pathology:   Echocardiogram:   Heart Catheterization:     Assessment & Plan:     ICD-10-CM   1. Right upper lobe pulmonary nodule  R91.1     2. Centrilobular emphysema ([place])  J43.2     3. Paraseptal emphysema ([place])  J43.8       Discussion:  This is a 66 year old gentleman with a right upper lobe pulmonary nodule found incidentally on CT imaging that he has annually for his thoracic aneurysm.  He has significant centrilobular and paraseptal emphysema.  A previous history of tobacco abuse.  Plan: We talked about the risk benefits and alternatives of proceeding with tissue biopsy in this particular lesion. The lesion size is suggestive of malignancy especially with association with emphysema. Also the fact that the nodule was not present last year on CT imaging. We will obtain a nuclear medicine PET scan that will be done prior to next appointment and hopefully before his procedure. We will tentatively schedule patient out for the next approximately 2-1/2 weeks for his bronchoscopy and hopefully get his PET scan done prior to that. Patient is agreeable to this plan. If  the PET scan for some reason shows that the lesion has morphologically changed or is not concerning for malignancy we can always reschedule his bronchoscopy or cancel it. But based on the lesion and the morphology and the patient's history the upper lobe lesion is concerning for malignancy and I think we go ahead safely schedule him for procedure. Patient is agreeable to this plan.  Bronchoscopy on 06/13/2022.  We will have a follow-up appointment proximally 1 week after with SG, NP.    Current Outpatient Medications:    aspirin 81 MG EC tablet, Take 81 mg by mouth daily., Disp: , Rfl:    Blood Glucose Monitoring Suppl (ONETOUCH VERIO) w/Device KIT, 1 Act by Does not apply route in the  morning and at bedtime., Disp: 1 kit, Rfl: 2   CVS D3 50 MCG (2000 UT) CAPS, TAKE 2 CAPSULES (4,000 UNITS TOTAL) BY MOUTH DAILY., Disp: 180 capsule, Rfl: 1   ezetimibe (ZETIA) 10 MG tablet, TAKE 1 TABLET BY MOUTH EVERY DAY, Disp: 90 tablet, Rfl: 3   famotidine (PEPCID) 40 MG tablet, TAKE 1 TABLET BY MOUTH EVERY DAY (Patient taking differently: Take 40 mg by mouth daily as needed.), Disp: 90 tablet, Rfl: 1   fexofenadine (ALLEGRA) 180 MG tablet, Take 180 mg by mouth daily as needed for allergies., Disp: , Rfl:    glucose blood (ONETOUCH VERIO) test strip, 1 each by Other route in the morning and at bedtime. Use as instructed, Disp: 200 each, Rfl: 3   KRILL OIL PO, Take 400 mg by mouth daily., Disp: , Rfl:    Lancets (ONETOUCH ULTRASOFT) lancets, , Disp: , Rfl:    metFORMIN (GLUCOPHAGE) 1000 MG tablet, TAKE 1 TABLET (1,000 MG TOTAL) BY MOUTH TWICE A DAY WITH FOOD, Disp: 180 tablet, Rfl: 0   NON FORMULARY, Oxygen continuous 1.5 liters with activity, Disp: , Rfl:    olmesartan (BENICAR) 40 MG tablet, Take 1 tablet (40 mg total) by mouth daily., Disp: 90 tablet, Rfl: 1   ONETOUCH ULTRA test strip, USE TO TEST UP TO 4 TIMES DAILY AS DIRECTED, Disp: 100 strip, Rfl: 5   ONETOUCH VERIO test strip, USE 1 PER DAY, Disp:  100 each, Rfl: 2   Tiotropium Bromide-Olodaterol (STIOLTO RESPIMAT) 2.5-2.5 MCG/ACT AERS, Inhale 2 puffs into the lungs daily., Disp: 12 g, Rfl: 1   KLOR-CON M20 20 MEQ tablet, TAKE 1 TABLET BY MOUTH TWICE A DAY (Patient not taking: Reported on 05/25/2022), Disp: 180 tablet, Rfl: 0    Garner Nash, DO [facility] 05/25/2022 11:40 AM

## 2022-06-05 ENCOUNTER — Telehealth: Payer: Medicare Other

## 2022-06-07 ENCOUNTER — Encounter (HOSPITAL_COMMUNITY)
Admission: RE | Admit: 2022-06-07 | Discharge: 2022-06-07 | Disposition: A | Discharge status: Home / Self Care | Payer: Medicare Other | Source: Ambulatory Visit | Attending: Pulmonary Disease | Admitting: Pulmonary Disease

## 2022-06-07 DIAGNOSIS — R918 Other nonspecific abnormal finding of lung field: Secondary | ICD-10-CM | POA: Diagnosis not present

## 2022-06-07 DIAGNOSIS — R911 Solitary pulmonary nodule: Secondary | ICD-10-CM | POA: Insufficient documentation

## 2022-06-07 LAB — GLUCOSE, CAPILLARY: Glucose-Capillary: 166 mg/dL — ABNORMAL HIGH (ref 70–99)

## 2022-06-07 MED ORDER — FLUDEOXYGLUCOSE F - 18 (FDG) INJECTION
10.7200 | Freq: Once | INTRAVENOUS | Status: AC
  Administered 2022-06-07: 10.72 via INTRAVENOUS

## 2022-06-09 ENCOUNTER — Telehealth: Payer: Self-pay | Admitting: Pulmonary Disease

## 2022-06-09 ENCOUNTER — Other Ambulatory Visit: Payer: Self-pay | Admitting: *Deleted

## 2022-06-09 ENCOUNTER — Other Ambulatory Visit: Payer: Medicare Other

## 2022-06-09 DIAGNOSIS — R911 Solitary pulmonary nodule: Secondary | ICD-10-CM | POA: Diagnosis not present

## 2022-06-09 NOTE — Telephone Encounter (Signed)
PCCM:  I called and spoke with patient.  Patient will keep appt for procedure on Tuesday  Eric Igo, DO [facility] 06/09/2022 5:03 PM

## 2022-06-09 NOTE — Telephone Encounter (Signed)
Called and spoke with patient.  Patient requested results from PET scan.  Patient had PET 06/08/22 and stated he was having a bronchoscopy 06/13/22.  Patient wanted results for his final decision about upcoming bronchoscopy.  Message routed to Dr. Tonia Brooms to advise

## 2022-06-11 LAB — NOVEL CORONAVIRUS, NAA: SARS-CoV-2, NAA: NOT DETECTED

## 2022-06-12 ENCOUNTER — Encounter (HOSPITAL_COMMUNITY): Payer: Self-pay | Admitting: Pulmonary Disease

## 2022-06-12 ENCOUNTER — Other Ambulatory Visit: Payer: Self-pay

## 2022-06-12 NOTE — Anesthesia Preprocedure Evaluation (Signed)
Anesthesia Evaluation  Patient identified by MRN, date of birth, ID band Patient awake    Reviewed: Allergy & Precautions, NPO status , Patient's Chart, lab work & pertinent test results  Airway Mallampati: III  TM Distance: >3 FB Neck ROM: Full    Dental no notable dental hx. (+) Teeth Intact, Dental Advisory Given   Pulmonary COPD,  COPD inhaler, former smoker,  Quit smoking 2005, 16 pack year history    Pulmonary exam normal breath sounds clear to auscultation       Cardiovascular hypertension (140/91 in preop), Pt. on medications Normal cardiovascular exam Rhythm:Regular Rate:Normal  Echo 2018 Normal LV systolic function; grade 1 diastolic dysfunction; mildLVH; mildly dilated aortic root.    Neuro/Psych PSYCHIATRIC DISORDERS Anxiety Depression negative neurological ROS     GI/Hepatic Neg liver ROS, GERD  Controlled,  Endo/Other  diabetes, Well Controlled, Type 2, Oral Hypoglycemic AgentsFS 140 this AM in preop   Renal/GU negative Renal ROS  negative genitourinary   Musculoskeletal negative musculoskeletal ROS (+)   Abdominal   Peds  Hematology negative hematology ROS (+)   Anesthesia Other Findings   Reproductive/Obstetrics negative OB ROS                            Anesthesia Physical Anesthesia Plan  ASA: 3  Anesthesia Plan: General   Post-op Pain Management:    Induction: Intravenous  PONV Risk Score and Plan: 2 and Ondansetron, Midazolam and Treatment may vary due to age or medical condition  Airway Management Planned: Oral ETT  Additional Equipment: None  Intra-op Plan:   Post-operative Plan: Extubation in OR  Informed Consent: I have reviewed the patients History and Physical, chart, labs and discussed the procedure including the risks, benefits and alternatives for the proposed anesthesia with the patient or authorized representative who has indicated his/her  understanding and acceptance.     Dental advisory given  Plan Discussed with: CRNA  Anesthesia Plan Comments:        Anesthesia Quick Evaluation

## 2022-06-12 NOTE — Progress Notes (Signed)
PCP - Dr. Sanda Linger  EKG - 04/26/22 Stress Test - 02/02/20 ECHO - 11/2009  Fasting Blood Sugar - 130-160 Checks Blood Sugar 2/day  Aspirin Instructions: last dose 06/11/22  ERAS Protcol - NPO  COVID TEST- Neg 06/09/22  Anesthesia review: Y  Patient verbally denies any shortness of breath, fever, cough and chest pain during phone call   -------------  SDW INSTRUCTIONS given:  Your procedure is scheduled on 06/13/22.  Report to [facility] Main Entrance "A" at 0645 A.M., and check in at the Admitting office.  Call this number if you have problems the morning of surgery:  [phone]   Remember:  Do not eat or drink after midnight the night before your surgery     Take these medicines the morning of surgery with A SIP OF WATER  ezetimibe (ZETIA) fexofenadine (ALLEGRA)  famotidine (PEPCID)-if needed sodium chloride (OCEAN)-if needed Tiotropium Bromide-Olodaterol (STIOLTO RESPIMAT)-if needed Carboxymethylcellul-Glycerin (LUBRICATING EYE DROPS OP)-if needed  As of today, STOP taking any Aspirin (unless otherwise instructed by your surgeon) Aleve, Naproxen, Ibuprofen, Motrin, Advil, Goody's, BC's, all herbal medications, fish oil, and all vitamins.                      Do not wear jewelry, make up, or nail polish            Do not wear lotions, powders, perfumes/colognes, or deodorant.            Do not shave 48 hours prior to surgery.  Men may shave face and neck.            Do not bring valuables to the hospital.            [facility] is not responsible for any belongings or valuables.  Do NOT Smoke (Tobacco/Vaping) 24 hours prior to your procedure If you use a CPAP at night, you may bring all equipment for your overnight stay.   Contacts, glasses, dentures or bridgework may not be worn into surgery.      For patients admitted to the hospital, discharge time will be determined by your treatment team.   Patients discharged the day of surgery will not be allowed to  drive home, and someone needs to stay with them for 24 hours.    Special instructions:   [place]- Preparing For Surgery  Before surgery, you can play an important role. Because skin is not sterile, your skin needs to be as free of germs as possible. You can reduce the number of germs on your skin by washing with CHG (chlorahexidine gluconate) Soap before surgery.  CHG is an antiseptic cleaner which kills germs and bonds with the skin to continue killing germs even after washing.    Oral Hygiene is also important to reduce your risk of infection.  Remember - BRUSH YOUR TEETH THE MORNING OF SURGERY WITH YOUR REGULAR TOOTHPASTE  Please do not use if you have an allergy to CHG or antibacterial soaps. If your skin becomes reddened/irritated stop using the CHG.  Do not shave (including legs and underarms) for at least 48 hours prior to first CHG shower. It is OK to shave your face.  Please follow these instructions carefully.   Shower the NIGHT BEFORE SURGERY and the MORNING OF SURGERY with DIAL Soap.   Pat yourself dry with a CLEAN TOWEL.  Wear CLEAN PAJAMAS to bed the night before surgery  Place CLEAN SHEETS on your bed the night of your first shower  and DO NOT SLEEP WITH PETS.   Day of Surgery: Please shower morning of surgery  Wear Clean/Comfortable clothing the morning of surgery Do not apply any deodorants/lotions.   Remember to brush your teeth WITH YOUR REGULAR TOOTHPASTE.   Questions were answered. Patient verbalized understanding of instructions.

## 2022-06-13 ENCOUNTER — Encounter (HOSPITAL_COMMUNITY)
Admission: RE | Disposition: A | Discharge status: Home / Self Care | Payer: Self-pay | Source: Home / Self Care | Attending: Pulmonary Disease

## 2022-06-13 ENCOUNTER — Ambulatory Visit (HOSPITAL_BASED_OUTPATIENT_CLINIC_OR_DEPARTMENT_OTHER): Payer: Medicare Other | Admitting: Physician Assistant

## 2022-06-13 ENCOUNTER — Encounter (HOSPITAL_COMMUNITY): Payer: Self-pay | Admitting: Pulmonary Disease

## 2022-06-13 ENCOUNTER — Ambulatory Visit (HOSPITAL_COMMUNITY): Payer: Medicare Other

## 2022-06-13 ENCOUNTER — Telehealth: Payer: Self-pay | Admitting: Pulmonary Disease

## 2022-06-13 ENCOUNTER — Ambulatory Visit (HOSPITAL_COMMUNITY): Payer: Medicare Other | Admitting: Physician Assistant

## 2022-06-13 ENCOUNTER — Ambulatory Visit (HOSPITAL_COMMUNITY)
Admission: RE | Admit: 2022-06-13 | Discharge: 2022-06-13 | Disposition: A | Discharge status: Home / Self Care | Payer: Medicare Other | Attending: Pulmonary Disease | Admitting: Pulmonary Disease

## 2022-06-13 DIAGNOSIS — I10 Essential (primary) hypertension: Secondary | ICD-10-CM | POA: Diagnosis not present

## 2022-06-13 DIAGNOSIS — E785 Hyperlipidemia, unspecified: Secondary | ICD-10-CM | POA: Insufficient documentation

## 2022-06-13 DIAGNOSIS — R911 Solitary pulmonary nodule: Secondary | ICD-10-CM

## 2022-06-13 DIAGNOSIS — K219 Gastro-esophageal reflux disease without esophagitis: Secondary | ICD-10-CM | POA: Diagnosis not present

## 2022-06-13 DIAGNOSIS — J432 Centrilobular emphysema: Secondary | ICD-10-CM | POA: Insufficient documentation

## 2022-06-13 DIAGNOSIS — J449 Chronic obstructive pulmonary disease, unspecified: Secondary | ICD-10-CM | POA: Diagnosis not present

## 2022-06-13 DIAGNOSIS — E119 Type 2 diabetes mellitus without complications: Secondary | ICD-10-CM | POA: Insufficient documentation

## 2022-06-13 DIAGNOSIS — Z79899 Other long term (current) drug therapy: Secondary | ICD-10-CM | POA: Diagnosis not present

## 2022-06-13 DIAGNOSIS — Z7984 Long term (current) use of oral hypoglycemic drugs: Secondary | ICD-10-CM | POA: Insufficient documentation

## 2022-06-13 DIAGNOSIS — Z87891 Personal history of nicotine dependence: Secondary | ICD-10-CM | POA: Diagnosis not present

## 2022-06-13 DIAGNOSIS — J438 Other emphysema: Secondary | ICD-10-CM | POA: Diagnosis not present

## 2022-06-13 DIAGNOSIS — J439 Emphysema, unspecified: Secondary | ICD-10-CM | POA: Diagnosis not present

## 2022-06-13 HISTORY — DX: Aortic aneurysm of unspecified site, without rupture: I71.9

## 2022-06-13 HISTORY — DX: Pneumonia, unspecified organism: J18.9

## 2022-06-13 HISTORY — PX: BRONCHIAL WASHINGS: SHX5105

## 2022-06-13 HISTORY — DX: Emphysema, unspecified: J43.9

## 2022-06-13 LAB — POCT I-STAT, CHEM 8
BUN: 11 mg/dL (ref 8–23)
Calcium, Ion: 1.13 mmol/L — ABNORMAL LOW (ref 1.15–1.40)
Chloride: 107 mmol/L (ref 98–111)
Creatinine, Ser: 0.8 mg/dL (ref 0.61–1.24)
Glucose, Bld: 151 mg/dL — ABNORMAL HIGH (ref 70–99)
HCT: 41 % (ref 39.0–52.0)
Hemoglobin: 13.9 g/dL (ref 13.0–17.0)
Potassium: 4.2 mmol/L (ref 3.5–5.1)
Sodium: 137 mmol/L (ref 135–145)
TCO2: 20 mmol/L — ABNORMAL LOW (ref 22–32)

## 2022-06-13 LAB — GLUCOSE, CAPILLARY
Glucose-Capillary: 140 mg/dL — ABNORMAL HIGH (ref 70–99)
Glucose-Capillary: 129 mg/dL — ABNORMAL HIGH (ref 70–99)

## 2022-06-13 SURGERY — BRONCHOSCOPY, WITH BIOPSY USING ELECTROMAGNETIC NAVIGATION
Anesthesia: General | Laterality: Right

## 2022-06-13 MED ORDER — DEXAMETHASONE SODIUM PHOSPHATE 10 MG/ML IJ SOLN
INTRAMUSCULAR | Status: DC | PRN
  Administered 2022-06-13: 8 mg via INTRAVENOUS

## 2022-06-13 MED ORDER — INSULIN ASPART 100 UNIT/ML IJ SOLN
INTRAMUSCULAR | Status: AC
  Filled 2022-06-13: qty 1

## 2022-06-13 MED ORDER — FENTANYL CITRATE (PF) 100 MCG/2ML IJ SOLN
INTRAMUSCULAR | Status: AC
  Filled 2022-06-13: qty 2

## 2022-06-13 MED ORDER — FENTANYL CITRATE (PF) 250 MCG/5ML IJ SOLN
INTRAMUSCULAR | Status: DC | PRN
Start: 2022-06-13 — End: 2022-06-13
  Administered 2022-06-13: 50 ug via INTRAVENOUS

## 2022-06-13 MED ORDER — PHENYLEPHRINE HCL (PRESSORS) 10 MG/ML IV SOLN
INTRAVENOUS | Status: DC | PRN
  Administered 2022-06-13 (×5): 80 ug via INTRAVENOUS

## 2022-06-13 MED ORDER — PROPOFOL 10 MG/ML IV BOLUS
INTRAVENOUS | Status: DC | PRN
  Administered 2022-06-13: 50 mg via INTRAVENOUS
  Administered 2022-06-13: 100 mg via INTRAVENOUS

## 2022-06-13 MED ORDER — LIDOCAINE 2% (20 MG/ML) 5 ML SYRINGE
INTRAMUSCULAR | Status: DC | PRN
  Administered 2022-06-13: 60 mg via INTRAVENOUS

## 2022-06-13 MED ORDER — LACTATED RINGERS IV SOLN: INTRAVENOUS | Status: DC

## 2022-06-13 MED ORDER — ROCURONIUM BROMIDE 10 MG/ML (PF) SYRINGE
PREFILLED_SYRINGE | INTRAVENOUS | Status: DC | PRN
  Administered 2022-06-13: 80 mg via INTRAVENOUS

## 2022-06-13 MED ORDER — ONDANSETRON HCL 4 MG/2ML IJ SOLN: 4.0000 mg | Freq: Once | INTRAMUSCULAR | Status: DC | PRN

## 2022-06-13 MED ORDER — INSULIN ASPART 100 UNIT/ML IJ SOLN
0.0000 [IU] | INTRAMUSCULAR | Status: DC | PRN
  Administered 2022-06-13: 2 [IU] via SUBCUTANEOUS

## 2022-06-13 MED ORDER — ONDANSETRON HCL 4 MG/2ML IJ SOLN
INTRAMUSCULAR | Status: DC | PRN
  Administered 2022-06-13: 4 mg via INTRAVENOUS

## 2022-06-13 MED ORDER — SUGAMMADEX SODIUM 200 MG/2ML IV SOLN
INTRAVENOUS | Status: DC | PRN
  Administered 2022-06-13: 400 mg via INTRAVENOUS

## 2022-06-13 MED ORDER — MIDAZOLAM HCL 2 MG/2ML IJ SOLN
INTRAMUSCULAR | Status: AC
  Filled 2022-06-13: qty 2

## 2022-06-13 MED ORDER — MIDAZOLAM HCL 5 MG/5ML IJ SOLN
INTRAMUSCULAR | Status: DC | PRN
  Administered 2022-06-13: 2 mg via INTRAVENOUS

## 2022-06-13 MED ORDER — CHLORHEXIDINE GLUCONATE 0.12 % MT SOLN
15.0000 mL | OROMUCOSAL | Status: AC
  Administered 2022-06-13: 15 mL via OROMUCOSAL
  Filled 2022-06-13 (×2): qty 15

## 2022-06-13 MED ORDER — PHENYLEPHRINE HCL-NACL 20-0.9 MG/250ML-% IV SOLN
INTRAVENOUS | Status: DC | PRN
  Administered 2022-06-13: 25 ug/min via INTRAVENOUS

## 2022-06-13 MED ORDER — FENTANYL CITRATE (PF) 100 MCG/2ML IJ SOLN: 25.0000 ug | INTRAMUSCULAR | Status: DC | PRN

## 2022-06-13 NOTE — Discharge Instructions (Signed)
Flexible Bronchoscopy, Care After This sheet gives you information about how to care for yourself after your test. Your doctor may also give you more specific instructions. If you have problems or questions, contact your doctor. Follow these instructions at home: Eating and drinking Do not eat or drink anything (not even water) for 2 hours after your test, or until your numbing medicine (local anesthetic) wears off. When your numbness is gone and your cough and gag reflexes have come back, you may: Eat only soft foods. Slowly drink liquids. The day after the test, go back to your normal diet. Driving Do not drive for 24 hours if you were given a medicine to help you relax (sedative). Do not drive or use heavy machinery while taking prescription pain medicine. General instructions  Take over-the-counter and prescription medicines only as told by your doctor. Return to your normal activities as told. Ask what activities are safe for you. Do not use any products that have nicotine or tobacco in them. This includes cigarettes and e-cigarettes. If you need help quitting, ask your doctor. Keep all follow-up visits as told by your doctor. This is important. It is very important if you had a tissue sample (biopsy) taken. Get help right away if: You have shortness of breath that gets worse. You get light-headed. You feel like you are going to pass out (faint). You have chest pain. You cough up: More than a little blood. More blood than before. Summary Do not eat or drink anything (not even water) for 2 hours after your test, or until your numbing medicine wears off. Do not use cigarettes. Do not use e-cigarettes. Get help right away if you have chest pain.  This information is not intended to replace advice given to you by your health care provider. Make sure you discuss any questions you have with your health care provider. Document Released: 07/30/2009 Document Revised: 09/14/2017 Document  Reviewed: 10/20/2016 Elsevier Patient Education  2020 [employer].

## 2022-06-13 NOTE — Op Note (Signed)
Video Bronchoscopy with Robotic Assisted Bronchoscopic Navigation   Date of Operation: 06/13/2022   Pre-op Diagnosis: Right upper lobe pulmonary nodule  Post-op Diagnosis: Resolved right upper lobe pulmonary nodule  Surgeon: Garner Nash, DO   Assistants: None   Anesthesia: General endotracheal anesthesia  Operation: Flexible video fiberoptic bronchoscopy with robotic assistance and biopsies.  Estimated Blood Loss: Minimal  Complications: None  Indications and History: [name] is a 66 y.o. male with history of RUL pulmonary nodule. The risks, benefits, complications, treatment options and expected outcomes were discussed with the patient.  The possibilities of pneumothorax, pneumonia, reaction to medication, pulmonary aspiration, perforation of a viscus, bleeding, failure to diagnose a condition and creating a complication requiring transfusion or operation were discussed with the patient who freely signed the consent.    Description of Procedure: The patient was seen in the Preoperative Area, was examined and was deemed appropriate to proceed.  The patient was taken to [facility] endoscopy room 3, identified as [name] and the procedure verified as Flexible Video Fiberoptic Bronchoscopy.  A Time Out was held and the above information confirmed.   Prior to the date of the procedure a high-resolution CT scan of the chest was performed. Utilizing ION software program a virtual tracheobronchial tree was generated to allow the creation of distinct navigation pathways to the patient's parenchymal abnormalities. After being taken to the operating room general anesthesia was initiated and the patient  was orally intubated. The video fiberoptic bronchoscope was introduced via the endotracheal tube and a general inspection was performed which showed normal right and left lung anatomy, aspiration of the bilateral mainstems was completed to remove any remaining secretions. Robotic catheter  inserted into patient's endotracheal tube.   Target #1 right upper lobe: The distinct navigation pathways prepared prior to this procedure were then utilized to navigate to patient's lesion identified on CT scan. The robotic catheter was secured into place and the vision probe was withdrawn.  Lesion location was approximated using fluoroscopy and three-dimensional cone beam CT imaging for peripheral targeting.  Reviewed the CT images in the room the nodule in question within the right upper lobe was very ill-defined and almost 90+ percent resolved.  Suggestive of an inflammatory lesion.  A significant difference from the PET scan that was completed just a week ago. A bronchioalveolar lavage was performed in the right upper lobe and sent for microbiology.  At the end of the procedure a general airway inspection was performed and there was no evidence of active bleeding. The bronchoscope was removed.  The patient tolerated the procedure well. There was no significant blood loss and there were no obvious complications. A post-procedural chest x-ray is pending.  Samples Target #1: 1. Bronchoalveolar lavage from RUL  Plans:  The patient will be discharged from the PACU to home when recovered from anesthesia and after chest x-ray is reviewed. We will review the microbiology results with the patient when they become available. Outpatient followup will be with Garner Nash, DO.   Garner Nash, DO [facility] 06/13/2022 8:36 AM

## 2022-06-13 NOTE — Telephone Encounter (Signed)
PCCM attending:  Bronchoscopy today, real-time imaging in room shows near complete resolution of lung nodule of concern.  Patient needs a repeat noncontrasted CT scan of the chest in 6 months.  Orders have been placed for repeat CT in 6 months.  Please ensure follow-up with me or Kandice Robinsons, NP after CT imaging.  [name], DO [facility] 06/13/2022 8:40 AM

## 2022-06-13 NOTE — Interval H&P Note (Signed)
History and Physical Interval Note:  06/13/2022 8:02 AM  [name]  has presented today for surgery, with the diagnosis of Lung nodule.  The various methods of treatment have been discussed with the patient and family. After consideration of risks, benefits and other options for treatment, the patient has consented to  Procedure(s) with comments: ROBOTIC ASSISTED NAVIGATIONAL BRONCHOSCOPY (Right) - ION w/ CIOS as a surgical intervention.  The patient's history has been reviewed, patient examined, no change in status, stable for surgery.  I have reviewed the patient's chart and labs.  Questions were answered to the patient's satisfaction.    We talked about his recent PET imaging. The nodule is small and there is no significant hypermetabolic uptake. He wanted to be more aggressive and move forward with biopsy in case this could represent a malignancy.    Rachel Bo Pope Brunty

## 2022-06-13 NOTE — Anesthesia Procedure Notes (Signed)
Procedure Name: Intubation Date/Time: 06/13/2022 7:59 AM  Performed by: Lowella Dell, CRNAPre-anesthesia Checklist: Patient identified, Emergency Drugs available, Suction available and Patient being monitored Patient Re-evaluated:Patient Re-evaluated prior to induction Oxygen Delivery Method: Circle System Utilized Preoxygenation: Pre-oxygenation with 100% oxygen Induction Type: IV induction Ventilation: Mask ventilation without difficulty and Oral airway inserted - appropriate to patient size Laryngoscope Size: Mac and 4 Grade View: Grade I Tube type: Oral Number of attempts: 1 Airway Equipment and Method: Stylet Placement Confirmation: ETT inserted through vocal cords under direct vision, positive ETCO2 and breath sounds checked- equal and bilateral Secured at: 23 cm Tube secured with: Tape Dental Injury: Teeth and Oropharynx as per pre-operative assessment

## 2022-06-13 NOTE — Anesthesia Postprocedure Evaluation (Signed)
Anesthesia Post Note  Patient: TANIA PERROTT  Procedure(s) Performed: ROBOTIC ASSISTED NAVIGATIONAL BRONCHOSCOPY (Right) BRONCHIAL WASHINGS     Patient location during evaluation: PACU Anesthesia Type: General Level of consciousness: awake and alert, oriented and patient cooperative Pain management: pain level controlled Vital Signs Assessment: post-procedure vital signs reviewed and stable Respiratory status: spontaneous breathing, nonlabored ventilation and respiratory function stable Cardiovascular status: blood pressure returned to baseline and stable Postop Assessment: no apparent nausea or vomiting Anesthetic complications: no   No notable events documented.  Last Vitals:  Vitals:   06/13/22 0900 06/13/22 0912  BP: 123/86 (!) 124/93  Pulse: 73 68  Resp: 17 12  Temp:  36.9 C  SpO2: 92% 93%    Last Pain:  Vitals:   06/13/22 0912  TempSrc:   PainSc: 0-No pain                 Lannie Fields

## 2022-06-13 NOTE — Transfer of Care (Signed)
Immediate Anesthesia Transfer of Care Note  Patient: [name]  Procedure(s) Performed: ROBOTIC ASSISTED NAVIGATIONAL BRONCHOSCOPY (Right) BRONCHIAL WASHINGS  Patient Location: PACU  Anesthesia Type:General  Level of Consciousness: awake and patient cooperative  Airway & Oxygen Therapy: Patient Spontanous Breathing  Post-op Assessment: Report given to RN and Post -op Vital signs reviewed and stable  Post vital signs: Reviewed and stable  Last Vitals:  Vitals Value Taken Time  BP 121/86 06/13/22 0845  Temp    Pulse 74 06/13/22 0845  Resp 15 06/13/22 0845  SpO2 92 % 06/13/22 0845  Vitals shown include unvalidated device data.  Last Pain:  Vitals:   06/13/22 0728  TempSrc:   PainSc: 0-No pain         Complications: No notable events documented.

## 2022-06-13 NOTE — Telephone Encounter (Signed)
Pt has been scheduled w/ [facility] on 9/11.  Left a detailed message on pt's VM w/ the appt info.

## 2022-06-13 NOTE — Interval H&P Note (Signed)
History and Physical Interval Note:  06/13/2022 7:37 AM  [name]  has presented today for surgery, with the diagnosis of Lung nodule.  The various methods of treatment have been discussed with the patient and family. After consideration of risks, benefits and other options for treatment, the patient has consented to  Procedure(s) with comments: ROBOTIC ASSISTED NAVIGATIONAL BRONCHOSCOPY (Right) - ION w/ CIOS as a surgical intervention.  The patient's history has been reviewed, patient examined, no change in status, stable for surgery.  I have reviewed the patient's chart and labs.  Questions were answered to the patient's satisfaction.     [name]

## 2022-06-15 LAB — FUNGUS STAIN

## 2022-06-15 LAB — CULTURE, BAL-QUANTITATIVE W GRAM STAIN: Culture: NO GROWTH

## 2022-06-15 LAB — FUNGAL STAIN REFLEX

## 2022-06-15 LAB — ACID FAST SMEAR (AFB, MYCOBACTERIA): Acid Fast Smear: NEGATIVE

## 2022-06-17 ENCOUNTER — Encounter (HOSPITAL_COMMUNITY): Payer: Self-pay | Admitting: Pulmonary Disease

## 2022-06-18 LAB — AEROBIC/ANAEROBIC CULTURE W GRAM STAIN (SURGICAL/DEEP WOUND)
Culture: NO GROWTH
Gram Stain: NONE SEEN

## 2022-06-22 ENCOUNTER — Ambulatory Visit: Payer: Medicare Other | Admitting: Nurse Practitioner

## 2022-06-22 ENCOUNTER — Encounter: Payer: Self-pay | Admitting: Nurse Practitioner

## 2022-06-22 VITALS — BP 140/80 | HR 91 | Ht 69.0 in | Wt 213.0 lb

## 2022-06-22 DIAGNOSIS — J438 Other emphysema: Secondary | ICD-10-CM

## 2022-06-22 DIAGNOSIS — R911 Solitary pulmonary nodule: Secondary | ICD-10-CM

## 2022-06-22 DIAGNOSIS — J9611 Chronic respiratory failure with hypoxia: Secondary | ICD-10-CM | POA: Diagnosis not present

## 2022-06-22 NOTE — Assessment & Plan Note (Signed)
Inconsistent use of oxygen.  He does monitor his oxygen levels at home; usually in the 90s.  Occasionally, after strenuous activity such as washing his car, he will drop into the 70s.  He will put his oxygen on for a brief period until he recovers.  He does not wear it consistently at night.  Recommended that we check ONO to determine O2 requirement at night.  O2 sat stable on room air today at 96%.  Goal SPO2 greater than 88 to 90%.

## 2022-06-22 NOTE — Progress Notes (Signed)
_0  ID: [name], male    DOB: [birth year], 66 y.o.   MRN: [id number]  Chief Complaint  Patient presents with   Follow-up    Referring provider: [name], MD  HPI: 66 year old male, former smoker followed for right lung nodule, COPD/emphysema, ILD and chronic respiratory failure on supplemental O2. He is a patient of Dr. Juline Patch and last seen in office on 05/25/2022. Past medical history significant for HTN, thoracic aortic aneurysm, GERD, DM, BPH, HLD.   TEST/EVENTS:  07/01/2018 PFT: FVC 111, FEV1 119, ratio 80, TLC 95, DLCO 71 05/03/2022 cardiac CT: stable 4 cm ascending thoracic aortic aneurysm. There is atherosclerosis present. Emphysematous disease is noted throughout both lungs. There is a 15x10 mm irregular density in the RUL, significantly enlarged when compared to prior.  06/07/2022 PET scan: no abnormal FDG avidity in the 13 mm RUL  05/25/2022: OV with Dr. Valeta Harms. Previously followed by Dr. Halford Chessman; has not been in seen in >5 years. He underwent CT chest on 05/03/2022 with interval development of a 15x10 mm irregular density in the RUL concerning for malignancy. CT was initially completed for evaluation of thoracic aortic aneurysm, which has been stable at 4 cm for quite some time. Discussed risk and benefits of proceeding with tissue biopsy; pt scheduled for bronchoscopy. PET scan ordered as well; if this does not reveal findings concerning for malignancy, will d/c procedure.   06/22/2022: Today-follow-up Patient presents today for follow-up to discuss bronchoscopy results which was completed on 8/29 for right upper lobe pulmonary nodule.  Imaging after the procedure showed that the nodule had almost entirely enlarged.  So far cultures are no growth.  Today, he reports that he has recovered well since his procedure.  He has an occasional cough with clear sputum.  Breathing is at his baseline.  Denies any hemoptysis, fevers, night sweats, increased chest congestion.  He continues  on [employer] daily.  Does not ever use an albuterol inhaler.  Wears his oxygen as needed after strenuous activity.  Does monitor his O2 levels at home.  Usually is in the 90s on room air.  Does not wear his oxygen at night.  No concerns or complaints today.  Allergies  Allergen Reactions   Morphine Itching and Nausea And Vomiting   Statins     weakness, muscle pains   Latex Rash    Immunization History  Administered Date(s) Administered   Fluad Quad(high Dose 65+) 06/24/2019, 07/28/2021   Influenza Split 07/05/2011, 07/16/2012   Influenza Whole 07/21/2009, 08/22/2010   Influenza,inj,Quad PF,6+ Mos 06/25/2013, 07/13/2014, 07/15/2015, 08/08/2016, 06/11/2017, 06/12/2018   Influenza-Unspecified 07/16/2020   Moderna Sars-Covid-2 Vaccination 12/16/2019, 01/11/2020, 03/27/2021   Pneumococcal Conjugate-13 06/25/2013   Pneumococcal Polysaccharide-23 08/17/2008, 08/08/2016, 04/26/2022   Td 10/16/2005   Tdap 01/12/2016   Zoster Recombinat (Shingrix) 04/28/2021, 07/05/2021   Zoster, Live 05/08/2016    Past Medical History:  Diagnosis Date   Aortic aneurysm ([place])    COPD (chronic obstructive pulmonary disease) ([place]) 09/23/2008   -PFT 09/23/08 FEV1 2.89(84%), FVC 3.70 (78%), TLC 5.28 (78%), DLCO 64% mixed obstructive and  restrictive pattern.  Spirometry 03/11/09 FEV 3.35 (97%),FVC 4.10 (86%), FEV% 82. Steroid responsive pneumonitis 04/09- required mechanical ventilation during hospitalization - The following hospital test were negative: PCP,ANA,HIV,BAL. - ACE25,RF20,CRP 28,A1AT 263, ESR 109   Depression    Diabetes mellitus without complication (HCC)    Dyslipidemia    Dyslipidemia    Emphysema/COPD (HCC)    GERD (gastroesophageal reflux disease)  History of skin cancer    Hypertension    Echo 10/09 EF 60%   ILD (interstitial lung disease) ([place])    Panic attack    Pneumonia    2009 "put on a vent"    Tobacco History: Social History   Tobacco Use  Smoking Status Former    Packs/day: 1.00   Years: 16.00   Total pack years: 16.00   Types: Cigarettes   Quit date: 10/17/2003   Years since quitting: 18.6   Passive exposure: Past  Smokeless Tobacco Never   Counseling given: Not Answered   Outpatient Medications Prior to Visit  Medication Sig Dispense Refill   fexofenadine (ALLEGRA) 180 MG tablet Take 180 mg by mouth daily.     Tiotropium Bromide-Olodaterol (STIOLTO RESPIMAT) 2.5-2.5 MCG/ACT AERS Inhale 2 puffs into the lungs daily. (Patient taking differently: Inhale 2 puffs into the lungs daily as needed (shortness of breath).) 12 g 1   aspirin 81 MG EC tablet Take 81 mg by mouth at bedtime.     Blood Glucose Monitoring Suppl (ONETOUCH VERIO) w/Device KIT 1 Act by Does not apply route in the morning and at bedtime. 1 kit 2   Carboxymethylcellul-Glycerin (LUBRICATING EYE DROPS OP) Place 1 drop into both eyes daily as needed (dry eyes).     [employer] D3 50 MCG (2000 UT) CAPS TAKE 2 CAPSULES (4,000 UNITS TOTAL) BY MOUTH DAILY. 180 capsule 1   ezetimibe (ZETIA) 10 MG tablet TAKE 1 TABLET BY MOUTH EVERY DAY 90 tablet 3   famotidine (PEPCID) 40 MG tablet TAKE 1 TABLET BY MOUTH EVERY DAY (Patient taking differently: Take 40 mg by mouth daily as needed.) 90 tablet 1   glucose blood (ONETOUCH VERIO) test strip 1 each by Other route in the morning and at bedtime. Use as instructed 200 each 3   ibuprofen (ADVIL) 200 MG tablet Take 400 mg by mouth every 6 (six) hours as needed for moderate pain.     KLOR-CON M20 20 MEQ tablet TAKE 1 TABLET BY MOUTH TWICE A DAY 180 tablet 0   KRILL OIL PO Take 400 mg by mouth daily.     Lancets (ONETOUCH ULTRASOFT) lancets      metFORMIN (GLUCOPHAGE) 1000 MG tablet TAKE 1 TABLET (1,000 MG TOTAL) BY MOUTH TWICE A DAY WITH FOOD 180 tablet 0   NON FORMULARY Oxygen continuous 1.5 liters with activity     olmesartan (BENICAR) 40 MG tablet Take 1 tablet (40 mg total) by mouth daily. (Patient taking differently: Take 20 mg by mouth daily.) 90 tablet 1    ONETOUCH ULTRA test strip USE TO TEST UP TO 4 TIMES DAILY AS DIRECTED 100 strip 5   ONETOUCH VERIO test strip USE 1 PER DAY 100 each 2   sodium chloride (OCEAN) 0.65 % SOLN nasal spray Place 1 spray into both nostrils as needed for congestion.     Tetrahydrozoline HCl (VISINE OP) Place 1 drop into both eyes daily as needed (allergies).     trolamine salicylate (ASPERCREME) 10 % cream Apply 1 Application topically as needed for muscle pain.     No facility-administered medications prior to visit.     Review of Systems:   Constitutional: No weight loss or gain, night sweats, fevers, chills, fatigue, or lassitude. HEENT: No headaches, difficulty swallowing, tooth/dental problems, or sore throat. No sneezing, itching, ear ache, nasal congestion, or post nasal drip CV:  No chest pain, orthopnea, PND, swelling in lower extremities, anasarca, dizziness, palpitations, syncope Resp: +shortness of  breath with strenuous exertion (baseline). No excess mucus or change in color of mucus. No productive or non-productive. No hemoptysis. No wheezing.  No chest wall deformity GI:  No heartburn, indigestion, abdominal pain, nausea, vomiting, diarrhea, change in bowel habits, loss of appetite, bloody stools.  GU: No dysuria, change in color of urine, urgency or frequency.  No flank pain, no hematuria  Skin: No rash, lesions, ulcerations MSK:  No joint pain or swelling.  No decreased range of motion.  No back pain. Neuro: No dizziness or lightheadedness.  Psych: No depression or anxiety. Mood stable.     Physical Exam:  BP (!) 140/80 (BP Location: Right Arm)   Pulse 91   Ht _0  (1.753 m)   Wt 213 lb (96.6 kg)   SpO2 96%   BMI 31.45 kg/m   GEN: Pleasant, interactive, well-appearing; obese; in no acute distress HEENT:  Normocephalic and atraumatic. PERRLA. Sclera white. Nasal turbinates pink, moist and patent bilaterally. No rhinorrhea present. Oropharynx pink and moist, without exudate or edema. No  lesions, ulcerations, or postnasal drip.  NECK:  Supple w/ fair ROM. No JVD present. Normal carotid impulses w/o bruits. Thyroid symmetrical with no goiter or nodules palpated. No lymphadenopathy.   CV: RRR, no m/r/g, no peripheral edema. Pulses intact, +2 bilaterally. No cyanosis, pallor or clubbing. PULMONARY:  Unlabored, regular breathing. Clear bilaterally A&P w/o wheezes/rales/rhonchi. No accessory muscle use. No dullness to percussion. GI: BS present and normoactive. Soft, non-tender to palpation. No organomegaly or masses detected. No CVA tenderness. MSK: No erythema, warmth or tenderness. Cap refil <2 sec all extrem. No deformities or joint swelling noted.  Neuro: A/Ox3. No focal deficits noted.   Skin: Warm, no lesions or rashe Psych: Normal affect and behavior. Judgement and thought content appropriate.     Lab Results:  CBC    Component Value Date/Time   WBC 8.6 10/26/2021 1151   RBC 4.67 10/26/2021 1151   HGB 13.9 06/13/2022 0748   HCT 41.0 06/13/2022 0748   PLT 244.0 10/26/2021 1151   MCV 89.2 10/26/2021 1151   MCHC 34.0 10/26/2021 1151   RDW 13.7 10/26/2021 1151   LYMPHSABS 2.2 10/26/2021 1151   MONOABS 1.1 (H) 10/26/2021 1151   EOSABS 0.4 10/26/2021 1151   BASOSABS 0.2 (H) 10/26/2021 1151    BMET    Component Value Date/Time   NA 137 06/13/2022 0748   K 4.2 06/13/2022 0748   CL 107 06/13/2022 0748   CO2 22 04/26/2022 1157   GLUCOSE 151 (H) 06/13/2022 0748   BUN 11 06/13/2022 0748   CREATININE 0.80 06/13/2022 0748   CALCIUM 9.6 04/26/2022 1157   GFRNONAA >60 08/19/2008 0655   GFRAA  08/19/2008 0655    >60        The eGFR has been calculated using the MDRD equation. This calculation has not been validated in all clinical    BNP No results found for: "BNP"   Imaging:  DG Chest Port 1 View  Result Date: 06/13/2022 CLINICAL DATA:  Status post bronchoscopy and biopsy of right upper lobe nodule EXAM: PORTABLE CHEST - 1 VIEW COMPARISON:  01/30/2014  FINDINGS: Cardiomediastinal silhouette and pulmonary vasculature are within normal limits. Advanced emphysematous changes of the lungs again seen. Known right upper lobe pulmonary nodule is better visualized on prior CT. No pneumothorax. IMPRESSION: No pneumothorax status post right upper lobe mass biopsy. Electronically Signed   By: Miachel Roux M.D.   On: 06/13/2022 08:59   DG C-ARM BRONCHOSCOPY  Result Date: 06/13/2022 C-ARM BRONCHOSCOPY: Fluoroscopy was utilized by the requesting physician.  No radiographic interpretation.   NM PET Image Initial (PI) Skull Base To Thigh (F-18 FDG)  Result Date: 06/08/2022 CLINICAL DATA:  Initial treatment strategy for pulmonary nodule. EXAM: NUCLEAR MEDICINE PET SKULL BASE TO THIGH TECHNIQUE: 10.72 mCi F-18 FDG was injected intravenously. Full-ring PET imaging was performed from the skull base to thigh after the radiotracer. CT data was obtained and used for attenuation correction and anatomic localization. Fasting blood glucose: 166 mg/dl COMPARISON:  CTA chest May 03, 2022 and CT December 19, 2016 FINDINGS: Mediastinal blood pool activity: SUV max 2.6 Liver activity: SUV max NA NECK: No hypermetabolic cervical adenopathy. Incidental CT findings: None. CHEST: NO abnormal FDG avidity in the right upper lobe pulmonary nodule which measures 13 x 7 mm on image 19/7 previously 15 x 10 mm. No hypermetabolic thoracic adenopathy. Incidental CT findings: Paraseptal and centrilobular emphysema with multifocal pulmonary scarring. Aortic atherosclerosis. Aneurysmal dilation of the ascending aorta measuring 4 cm. ABDOMEN/PELVIS: No abnormal hypermetabolic activity within the liver, pancreas, adrenal glands, or spleen. No hypermetabolic lymph nodes in the abdomen or pelvis. Diffuse hypermetabolic activity throughout the colon is commonly physiologic/medication-related. Incidental CT findings: Unremarkable noncontrast enhanced appearance of the liver, gallbladder, pancreas, spleen,  adrenal glands and kidneys. Colonic diverticulosis without findings of acute diverticulitis. Aneurysmal dilation of the infrarenal abdominal aorta measures 3.2 cm on image 155/4. SKELETON: No focal hypermetabolic activity to suggest skeletal metastasis. Incidental CT findings: Multilevel degenerative change of the spine. IMPRESSION: 1. NO abnormal FDG avidity in the 13 mm right upper lobe pulmonary nodule. Suggest follow-up with chest CT in 3-6 months to ensure stability. 2. Aneurysmal dilation of the ascending aorta measuring 4 cm. Recommend annual imaging followup by CTA or MRA. This recommendation follows 2010 ACCF/AHA/AATS/ACR/ASA/SCA/SCAI/SIR/STS/SVM Guidelines for the Diagnosis and Management of Patients with Thoracic Aortic Disease. Circulation. 2010; 121: D220-U542. Aortic aneurysm NOS (ICD10-I71.9) 3. Infrarenal abdominal aortic aneurysm measuring 3.2 cm. Recommend follow-up every 3 years.Reference: J Am Coll Radiol 7062;37:628-315. 4. Aortic Atherosclerosis (ICD10-I70.0) and Emphysema (ICD10-J43.9). Electronically Signed   By: Dahlia Bailiff M.D.   On: 06/08/2022 15:02         Latest Ref Rng & Units 07/01/2018    1:51 PM 03/12/2014   12:54 PM  PFT Results  FVC-Pre L 4.62  4.52   FVC-Predicted Pre % 111  102   FVC-Post L 4.58  4.40   FVC-Predicted Post % 110  99   Pre FEV1/FVC % % 80  81   Post FEV1/FCV % % 80  82   FEV1-Pre L 3.70  3.68   FEV1-Predicted Pre % 119  109   FEV1-Post L 3.69  3.63   DLCO uncorrected ml/min/mmHg 20.33  21.05   DLCO [facility]% % 71  71   DLVA Predicted % 81  101   TLC L 6.13    TLC % Predicted % 95    RV % Predicted % 72      No results found for: "NITRICOXIDE"      Assessment & Plan:   Right upper lobe pulmonary nodule Previously identified 15 x 10 mm irregular density in the right upper lobe on CT imaging from July 2023.  He underwent bronchoscopy on 8/29; imaging performed while in procedure room showed near resolution of nodule.  Cultures so far are  negative to date.  Plan for repeat CT chest in 6 months for surveillance.  Patient Instructions  Continue Stiolto 2 puffs  daily Continue Albuterol inhaler 2 puffs every 6 hours as needed for shortness of breath or wheezing. Notify if symptoms persist despite rescue inhaler/neb use.  Continue allegra 1 tab daily for allergies  Continue supplemental oxygen 2 lpm as needed with activity for goal oxygen >88-90%. We will obtain overnight oximetry study to determine need for oxygen at night since you haven't been wearing it  CT chest without contrast in 6 months   Follow up in 6 months with Dr. Valeta Harms after CT chest. If symptoms do not improve or worsen, please contact office for sooner follow up or seek emergency care.     EMPHYSEMA Compensated on current regimen.  Rare use of rescue.  Recovered well post bronchoscopy.  Respiratory failure with hypoxia (HCC) Inconsistent use of oxygen.  He does monitor his oxygen levels at home; usually in the 90s.  Occasionally, after strenuous activity such as washing his car, he will drop into the 70s.  He will put his oxygen on for a brief period until he recovers.  He does not wear it consistently at night.  Recommended that we check ONO to determine O2 requirement at night.  O2 sat stable on room air today at 96%.  Goal SPO2 greater than 88 to 90%.   I spent 28 minutes of dedicated to the care of this patient on the date of this encounter to include pre-visit review of records, face-to-face time with the patient discussing conditions above, post visit ordering of testing, clinical documentation with the electronic health record, making appropriate referrals as documented, and communicating necessary findings to members of the patients care team.  Clayton Bibles, NP 06/22/2022  Pt aware and understands NP's role.

## 2022-06-22 NOTE — Assessment & Plan Note (Signed)
Previously identified 15 x 10 mm irregular density in the right upper lobe on CT imaging from July 2023.  He underwent bronchoscopy on 8/29; imaging performed while in procedure room showed near resolution of nodule.  Cultures so far are negative to date.  Plan for repeat CT chest in 6 months for surveillance.  Patient Instructions  Continue Stiolto 2 puffs daily Continue Albuterol inhaler 2 puffs every 6 hours as needed for shortness of breath or wheezing. Notify if symptoms persist despite rescue inhaler/neb use.  Continue allegra 1 tab daily for allergies  Continue supplemental oxygen 2 lpm as needed with activity for goal oxygen >88-90%. We will obtain overnight oximetry study to determine need for oxygen at night since you haven't been wearing it  CT chest without contrast in 6 months   Follow up in 6 months with Dr. Tonia Brooms after CT chest. If symptoms do not improve or worsen, please contact office for sooner follow up or seek emergency care.

## 2022-06-22 NOTE — Assessment & Plan Note (Signed)
Compensated on current regimen.  Rare use of rescue.  Recovered well post bronchoscopy.

## 2022-06-22 NOTE — Patient Instructions (Addendum)
Continue Stiolto 2 puffs daily Continue Albuterol inhaler 2 puffs every 6 hours as needed for shortness of breath or wheezing. Notify if symptoms persist despite rescue inhaler/neb use.  Continue allegra 1 tab daily for allergies  Continue supplemental oxygen 2 lpm as needed with activity for goal oxygen >88-90%. We will obtain overnight oximetry study to determine need for oxygen at night since you haven't been wearing it  CT chest without contrast in 6 months   Follow up in 6 months with Dr. Tonia Brooms after CT chest. If symptoms do not improve or worsen, please contact office for sooner follow up or seek emergency care.

## 2022-07-09 ENCOUNTER — Other Ambulatory Visit: Payer: Self-pay | Admitting: Internal Medicine

## 2022-07-19 LAB — FUNGUS CULTURE RESULT

## 2022-07-19 LAB — FUNGUS CULTURE WITH STAIN

## 2022-07-19 LAB — FUNGAL ORGANISM REFLEX

## 2022-07-24 ENCOUNTER — Telehealth: Payer: Self-pay | Admitting: Internal Medicine

## 2022-07-24 NOTE — Telephone Encounter (Signed)
LVM for pt to rtn my call to schedule AWV with [facility] call back # [phone] 

## 2022-07-25 LAB — HM DIABETES EYE EXAM

## 2022-07-27 LAB — ACID FAST CULTURE WITH REFLEXED SENSITIVITIES (MYCOBACTERIA): Acid Fast Culture: NEGATIVE

## 2022-07-30 ENCOUNTER — Other Ambulatory Visit: Payer: Self-pay | Admitting: Internal Medicine

## 2022-07-30 DIAGNOSIS — E118 Type 2 diabetes mellitus with unspecified complications: Secondary | ICD-10-CM

## 2022-08-01 ENCOUNTER — Other Ambulatory Visit: Payer: Self-pay | Admitting: Internal Medicine

## 2022-08-01 DIAGNOSIS — E785 Hyperlipidemia, unspecified: Secondary | ICD-10-CM

## 2022-08-03 DIAGNOSIS — R0683 Snoring: Secondary | ICD-10-CM | POA: Diagnosis not present

## 2022-08-03 DIAGNOSIS — G4730 Sleep apnea, unspecified: Secondary | ICD-10-CM | POA: Diagnosis not present

## 2022-08-09 DIAGNOSIS — E119 Type 2 diabetes mellitus without complications: Secondary | ICD-10-CM | POA: Diagnosis not present

## 2022-08-11 ENCOUNTER — Ambulatory Visit (INDEPENDENT_AMBULATORY_CARE_PROVIDER_SITE_OTHER): Payer: Medicare Other

## 2022-08-11 VITALS — Ht 69.0 in | Wt 215.0 lb

## 2022-08-11 DIAGNOSIS — Z0000 Encounter for general adult medical examination without abnormal findings: Secondary | ICD-10-CM | POA: Diagnosis not present

## 2022-08-11 NOTE — Progress Notes (Signed)
Subjective:   [name] is a 66 y.o. male who presents for Medicare Annual/Subsequent preventive examination.   I connected with  TRIP CAVANAGH on 08/11/22 by a audio enabled telemedicine application and verified that I am speaking with the correct person using two identifiers.  Patient Location: Home  Provider Location: Home Office  I discussed the limitations of evaluation and management by telemedicine. The patient expressed understanding and agreed to proceed.  Review of Systems     Cardiac Risk Factors include: advanced age (>54mn, >>13women);diabetes mellitus;dyslipidemia;hypertension;male gender     Objective:    Today's Vitals   08/11/22 0850  Weight: 215 lb (97.5 kg)  Height: _0  (1.753 m)   Body mass index is 31.75 kg/m.     08/11/2022    8:56 AM 06/13/2022    7:25 AM 10/27/2021    5:43 PM 07/28/2021   12:29 PM 11/27/2020   12:04 AM 04/22/2020    1:12 PM 12/26/2018   12:25 PM  Advanced Directives  Does Patient Have a Medical Advance Directive? _1  No No  Would patient like information on creating a medical advance directive? No - Patient declined No - Patient declined No - Patient declined No - Patient declined No - Patient declined No - Patient declined Yes (ED - Information included in AVS)    Current Medications (verified) Outpatient Encounter Medications as of 08/11/2022  Medication Sig   aspirin 81 MG EC tablet Take 81 mg by mouth at bedtime.   Blood Glucose Monitoring Suppl (ONETOUCH VERIO) w/Device KIT 1 Act by Does not apply route in the morning and at bedtime.   Carboxymethylcellul-Glycerin (LUBRICATING EYE DROPS OP) Place 1 drop into both eyes daily as needed (dry eyes).   [employer] D3 50 MCG (2000 UT) CAPS TAKE 2 CAPSULES (4,000 UNITS TOTAL) BY MOUTH DAILY.   ezetimibe (ZETIA) 10 MG tablet TAKE 1 TABLET BY MOUTH EVERY DAY   famotidine (PEPCID) 40 MG tablet TAKE 1 TABLET BY MOUTH EVERY DAY (Patient taking differently: Take 40 mg by mouth  daily as needed.)   fexofenadine (ALLEGRA) 180 MG tablet Take 180 mg by mouth daily.   glucose blood (ONETOUCH VERIO) test strip 1 each by Other route in the morning and at bedtime. Use as instructed   ibuprofen (ADVIL) 200 MG tablet Take 400 mg by mouth every 6 (six) hours as needed for moderate pain.   KLOR-CON M20 20 MEQ tablet TAKE 1 TABLET BY MOUTH TWICE A DAY   KRILL OIL PO Take 400 mg by mouth daily.   Lancets (ONETOUCH ULTRASOFT) lancets    metFORMIN (GLUCOPHAGE) 1000 MG tablet TAKE 1 TABLET (1,000 MG TOTAL) BY MOUTH TWICE A DAY WITH FOOD   NON FORMULARY Oxygen continuous 1.5 liters with activity   olmesartan (BENICAR) 40 MG tablet Take 1 tablet (40 mg total) by mouth daily. (Patient taking differently: Take 20 mg by mouth daily.)   ONETOUCH ULTRA test strip USE TO TEST UP TO 4 TIMES DAILY AS DIRECTED   ONETOUCH VERIO test strip USE 1 PER DAY   sodium chloride (OCEAN) 0.65 % SOLN nasal spray Place 1 spray into both nostrils as needed for congestion.   Tetrahydrozoline HCl (VISINE OP) Place 1 drop into both eyes daily as needed (allergies).   Tiotropium Bromide-Olodaterol (STIOLTO RESPIMAT) 2.5-2.5 MCG/ACT AERS Inhale 2 puffs into the lungs daily. (Patient taking differently: Inhale 2 puffs into the lungs daily as needed (shortness of breath).)  trolamine salicylate (ASPERCREME) 10 % cream Apply 1 Application topically as needed for muscle pain.   No facility-administered encounter medications on file as of 08/11/2022.    Allergies (verified) Morphine, Statins, and Latex   History: Past Medical History:  Diagnosis Date   Aortic aneurysm (HCC)    COPD (chronic obstructive pulmonary disease) ([place]) 09/23/2008   -PFT 09/23/08 FEV1 2.89(84%), FVC 3.70 (78%), TLC 5.28 (78%), DLCO 64% mixed obstructive and  restrictive pattern.  Spirometry 03/11/09 FEV 3.35 (97%),FVC 4.10 (86%), FEV% 82. Steroid responsive pneumonitis 04/09- required mechanical ventilation during hospitalization - The  following hospital test were negative: PCP,ANA,HIV,BAL. - ACE25,RF20,CRP 28,A1AT 263, ESR 109   Depression    Diabetes mellitus without complication (HCC)    Dyslipidemia    Dyslipidemia    Emphysema/COPD (HCC)    GERD (gastroesophageal reflux disease)    History of skin cancer    Hypertension    Echo 10/09 EF 60%   ILD (interstitial lung disease) ([place])    Panic attack    Pneumonia    2009 "put on a vent"   Past Surgical History:  Procedure Laterality Date   BRONCHIAL WASHINGS  06/13/2022   Procedure: BRONCHIAL WASHINGS;  Surgeon: Garner Nash, DO;  Location: [facility];  Service: Pulmonary;;   BRONCHOSCOPY  10/09   Ganglion cyst on foot     HERNIA REPAIR     2 hernia   Left shoulder surgery     Family History  Problem Relation Age of Onset   Heart attack Father 37   Diabetes Father    Heart attack Brother    Emphysema Brother    Diabetes Brother    Social History   Socioeconomic History   Marital status: Divorced    Spouse name: Not on file   Number of children: 0   Years of education: 10th grade returned for GED   Highest education level: GED or equivalent  Occupational History   Occupation: Scientist, water quality: UNEMPLOYED    Comment: [employer]  Tobacco Use   Smoking status: Former    Packs/day: 1.00    Years: 16.00    Total pack years: 16.00    Types: Cigarettes    Quit date: 10/17/2003    Years since quitting: 18.8    Passive exposure: Past   Smokeless tobacco: Never  Vaping Use   Vaping Use: Never used  Substance and Sexual Activity   Alcohol use: No    Comment: Rare beer 3-4 times per year   Drug use: No   Sexual activity: Not Currently  Other Topics Concern   Not on file  Social History Narrative   Divorced. 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    Social Determinants of Health   Financial Resource Strain: Low Risk  (08/11/2022)   Overall Financial Resource Strain (CARDIA)    Difficulty of Paying Living Expenses: Not hard at all  Food Insecurity: No Food Insecurity (08/11/2022)   Hunger Vital Sign    Worried About Running Out of Food in the Last Year: Never true    Ran Out of Food in the Last Year: Never true  Transportation Needs:  No Transportation Needs (08/11/2022)   PRAPARE - Hydrologist (Medical): No    Lack of Transportation (Non-Medical): No  Physical Activity: Insufficiently Active (08/11/2022)   Exercise Vital Sign    Days of Exercise per Week: 3 days    Minutes of Exercise per Session: 30 min  Stress: No Stress Concern Present (08/11/2022)   [place]    Feeling of Stress : Not at all  Social Connections: Moderately Isolated (08/11/2022)   Social Connection and Isolation Panel [NHANES]    Frequency of Communication with Friends and Family: More than three times a week    Frequency of Social Gatherings with Friends and Family: More than three times a week    Attends Religious Services: 1 to 4 times per year    Active Member of [employer] or Organizations: No    Attends Music therapist: Never    Marital Status: Never married    Tobacco Counseling Counseling given: Not Answered   Clinical Intake:  Pre-visit preparation completed: Yes  Pain : No/denies pain     Nutritional Risks: None Diabetes: No  How often do you need to have someone help you when you read instructions, pamphlets, or other written materials from your doctor or pharmacy?: 1 - Never  Diabetic?yes  Nutrition Risk Assessment:  Has the patient had any N/V/D within the last 2 months?  No  Does the patient have any non-healing wounds?  No  Has the patient had any  unintentional weight loss or weight gain?  No   Diabetes:  Is the patient diabetic?  Yes  If diabetic, was a CBG obtained today?  No  Did the patient bring in their glucometer from home?  No  How often do you monitor your CBG's? Daily .   Financial Strains and Diabetes Management:  Are you having any financial strains with the device, your supplies or your medication? No .  Does the patient want to be seen by Chronic Care Management for management of their diabetes?  No  Would the patient like to be referred to a Nutritionist or for Diabetic Management?  No   Diabetic Exams:  Diabetic Eye Exam: Completed 07/2022 Diabetic Foot Exam: Overdue, Pt has been advised about the importance in completing this exam. Pt is scheduled for diabetic foot exam on next office visit .   Interpreter Needed?: No  Information entered by :: Jadene Pierini, LPN   Activities of Daily Living    08/11/2022    8:55 AM  In your present state of health, do you have any difficulty performing the following activities:  Hearing? 0  Vision? 0  Difficulty concentrating or making decisions? 0  Walking or climbing stairs? 0  Dressing or bathing? 0  Doing errands, shopping? 0  Preparing Food and eating ? N  Using the Toilet? N  In the past six months, have you accidently leaked urine? N  Do you have problems with loss of bowel control? N  Managing your Medications? N  Managing your Finances? N  Housekeeping or managing your Housekeeping? N    Patient Care Team: [name], MD as PCP - General (Internal Medicine) Chesley Mires, MD as Consulting Physician (Pulmonary Disease) End, Harrell Gave, MD as Consulting Physician (Cardiology) Szabat, Darnelle Maffucci, [facility] (Inactive) (Pharmacist)  Indicate any recent Medical Services you may have received from other than [facility] providers in the past year (date may be approximate).  Assessment:   This is a routine wellness examination for Delvon.  Hearing/Vision  screen Vision Screening - Comments:: Annual eye exams wears glasses   Dietary issues and exercise activities discussed: Current Exercise Habits: Home exercise routine, Type of exercise: walking, Time (Minutes): 30, Frequency (Times/Week): 3, Weekly Exercise (Minutes/Week): 90, Intensity: Mild, Exercise limited by: cardiac condition(s)   Goals Addressed               This Visit's Progress     Patient Stated (pt-stated)   On track     My goal is to maintain my weight and glucose.       Depression Screen    08/11/2022    8:54 AM 11/16/2021    1:15 PM 08/23/2021    6:13 PM 07/28/2021   12:31 PM 04/25/2021   10:46 AM 01/28/2021   10:20 AM 11/26/2020    3:30 PM  PHQ 2/9 Scores  PHQ - 2 Score 0 0 1 0 1 0 0    Fall Risk    08/11/2022    8:52 AM 04/28/2022    9:40 AM 02/14/2022    9:45 AM 12/28/2021   11:30 AM 11/16/2021    1:15 PM  Fall Risk   Falls in the past year? 0 0 0  0  Comment  continues to deny falls x last 12 months; does not require/ use assistive devices continues to deny falls- does not use assistive devices Denies new/ recent falls since last outreach 11/30/21; continues to deny use of assistive devices   Number falls in past yr: 0 0 0  0  Injury with Fall? 0 0 0  0  Comment   N/A- denies falls x 12 months  N/A- no falls reported  Risk for fall due to : No Fall Risks No Fall Risks Medication side effect  No Fall Risks  Follow up _0     FALL RISK PREVENTION PERTAINING TO THE HOME:  Any stairs in or around the home? No  If so, are there any without handrails? No  Home free of loose throw rugs in walkways, pet beds, electrical cords, etc? Yes  Adequate lighting in your home to reduce risk of falls? Yes   ASSISTIVE DEVICES UTILIZED TO PREVENT FALLS:  Life alert? No  Use of a cane, walker or w/c? No  Grab bars in the bathroom? No  Shower  chair or bench in shower? No  Elevated toilet seat or a handicapped toilet? No        08/11/2022    8:55 AM 04/22/2020    1:13 PM  6CIT Screen  What Year? 0 points 0 points  What month? 0 points 0 points  What time? 0 points 0 points  Count back from 20 0 points 0 points  Months in reverse 0 points 0 points  Repeat phrase 0 points 0 points  Total Score 0 points 0 points    Immunizations Immunization History  Administered Date(s) Administered   Fluad Quad(high Dose 65+) 06/24/2019, 07/28/2021   Influenza Split 07/05/2011, 07/16/2012   Influenza Whole 07/21/2009, 08/22/2010   Influenza,inj,Quad PF,6+ Mos 06/25/2013, 07/13/2014, 07/15/2015, 08/08/2016, 06/11/2017, 06/12/2018   Influenza-Unspecified 07/16/2020   Moderna Sars-Covid-2 Vaccination 12/16/2019, 01/11/2020, 03/27/2021   Pneumococcal Conjugate-13 06/25/2013   Pneumococcal Polysaccharide-23 08/17/2008, 08/08/2016, 04/26/2022   Td 10/16/2005   Tdap 01/12/2016   Zoster Recombinat (Shingrix) 04/28/2021, 07/05/2021   Zoster, Live 05/08/2016  TDAP status: Up to date  Flu Vaccine status: Up to date  Pneumococcal vaccine status: Up to date  Covid-19 vaccine status: Completed vaccines  Qualifies for Shingles Vaccine? Yes   Zostavax completed No   Shingrix Completed?: Yes  Screening Tests Health Maintenance  Topic Date Due   COVID-19 Vaccine (4 - Moderna series) 05/22/2021   INFLUENZA VACCINE  05/16/2022   OPHTHALMOLOGY EXAM  07/20/2022   Diabetic kidney evaluation - Urine ACR  10/26/2022   HEMOGLOBIN A1C  10/27/2022   FOOT EXAM  10/29/2022   Diabetic kidney evaluation - GFR measurement  04/27/2023   Medicare Annual Wellness (AWV)  08/12/2023   Fecal DNA (Cologuard)  11/02/2024   TETANUS/TDAP  01/11/2026   Pneumonia Vaccine 93+ Years old  Completed   Hepatitis C Screening  Completed   Zoster Vaccines- Shingrix  Completed   HPV VACCINES  Aged Out   COLONOSCOPY (Pts 45-55yr Insurance coverage will need to be  confirmed)  Discontinued    Health Maintenance  Health Maintenance Due  Topic Date Due   COVID-19 Vaccine (4 - Moderna series) 05/22/2021   INFLUENZA VACCINE  05/16/2022   OPHTHALMOLOGY EXAM  07/20/2022    Colorectal cancer screening: Type of screening: Cologuard. Completed 11/02/2021. Repeat every 3 years  Lung Cancer Screening: (Low Dose CT Chest recommended if Age 68-80years, 30 pack-year currently smoking OR have quit w/in 15years.) does not qualify.   Lung Cancer Screening Referral: n/a  Additional Screening:  Hepatitis C Screening: does not qualify;   Vision Screening: Recommended annual ophthalmology exams for early detection of glaucoma and other disorders of the eye. Is the patient up to date with their annual eye exam?  Yes  Who is the provider or what is the name of the office in which the patient attends annual eye exams? Dr.gilland  If pt is not established with a provider, would they like to be referred to a provider to establish care? No .   Dental Screening: Recommended annual dental exams for proper oral hygiene  Community Resource Referral / Chronic Care Management: CRR required this visit?  No   CCM required this visit?  No      Plan:     I have personally reviewed and noted the following in the patient's chart:   Medical and social history Use of alcohol, tobacco or illicit drugs  Current medications and supplements including opioid prescriptions. Patient is not currently taking opioid prescriptions. Functional ability and status Nutritional status Physical activity Advanced directives List of other physicians Hospitalizations, surgeries, and ER visits in previous 12 months Vitals Screenings to include cognitive, depression, and falls Referrals and appointments  In addition, I have reviewed and discussed with patient certain preventive protocols, quality metrics, and best practice recommendations. A written personalized care plan for  preventive services as well as general preventive health recommendations were provided to patient.     LDaphane Shepherd LPN   [id number]  Nurse Notes: none

## 2022-08-11 NOTE — Patient Instructions (Signed)
[name] , Thank you for taking time to come for your Medicare Wellness Visit. I appreciate your ongoing commitment to your health goals. Please review the following plan we discussed and let me know if I can assist you in the future.   These are the goals we discussed:  Goals       Eat Healthy      Timeframe:  Long-Range Goal Priority:  High Start Date:      07/07/21                       Expected End Date:  07/07/22                     Follow Up Date March 2023   - drink 6 to 8 glasses of water each day - fill half of plate with vegetables - limit fast food meals to no more than 1 per week - manage portion size - read food labels for fat, fiber, carbohydrates and portion size - switch to sugar-free drinks    Why is this important?   When you are ready to manage your nutrition or weight, having a plan and setting goals will help.  Taking small steps to change how you eat and exercise is a good place to start.    Notes:       Manage My Medicine [facility]) (pt-stated)      Timeframe:  Long-Range Goal Priority:  High Start Date:      04/06/21                       Expected End Date:  12/30/2022                 Follow Up Date 04/2023   - call for medicine refill 2 or 3 days before it runs out - call if I am sick and can't take my medicine - keep a list of all the medicines I take; vitamins and herbals too    Why is this important?   These steps will help you keep on track with your medicines.        Monitor and Manage My Blood Sugar-Diabetes Type 2      Timeframe:  Long-Range Goal Priority:  High Start Date:   12/06/2021                         Expected End Date:  12/06/2022                     Follow Up Date 02/02/2022   - check blood sugar at prescribed times - check blood sugar before and after exercise - check blood sugar if I feel it is too high or too low - take the blood sugar log to all doctor visits    Why is this important?   Checking your blood sugar at home helps  to keep it from getting very high or very low.  Writing the results in a diary or log helps the doctor know how to care for you.  Your blood sugar log should have the time, date and the results.  Also, write down the amount of insulin or other medicine that you take.  Other information, like what you ate, exercise done and how you were feeling, will also be helpful.     Notes:       Patient Stated (pt-stated)  My goal is to maintain my weight and glucose.        This is a list of the screening recommended for you and due dates:  Health Maintenance  Topic Date Due   COVID-19 Vaccine (4 - Moderna series) 05/22/2021   Flu Shot  05/16/2022   Eye exam for diabetics  07/20/2022   Yearly kidney health urinalysis for diabetes  10/26/2022   Hemoglobin A1C  10/27/2022   Complete foot exam   10/29/2022   Yearly kidney function blood test for diabetes  04/27/2023   Medicare Annual Wellness Visit  08/12/2023   Cologuard (Stool DNA test)  11/02/2024   Tetanus Vaccine  01/11/2026   Pneumonia Vaccine  Completed   Hepatitis C Screening: USPSTF Recommendation to screen - Ages 37-79 yo.  Completed   Zoster (Shingles) Vaccine  Completed   HPV Vaccine  Aged Out   Colon Cancer Screening  Discontinued    Advanced directives: Please bring a copy of your health care power of attorney and living will to the office to be added to your chart at your convenience.   Conditions/risks identified: Aim for 30 minutes of exercise or brisk walking, 6-8 glasses of water, and 5 servings of fruits and vegetables each day.   Next appointment: Follow up in one year for your annual wellness visit.   Preventive Care 55 Years and Older, Male  Preventive care refers to lifestyle choices and visits with your health care provider that can promote health and wellness. What does preventive care include? A yearly physical exam. This is also called an annual well check. Dental exams once or twice a year. Routine eye  exams. Ask your health care provider how often you should have your eyes checked. Personal lifestyle choices, including: Daily care of your teeth and gums. Regular physical activity. Eating a healthy diet. Avoiding tobacco and drug use. Limiting alcohol use. Practicing safe sex. Taking low doses of aspirin every day. Taking vitamin and mineral supplements as recommended by your health care provider. What happens during an annual well check? The services and screenings done by your health care provider during your annual well check will depend on your age, overall health, lifestyle risk factors, and family history of disease. Counseling  Your health care provider may ask you questions about your: Alcohol use. Tobacco use. Drug use. Emotional well-being. Home and relationship well-being. Sexual activity. Eating habits. History of falls. Memory and ability to understand (cognition). Work and work Astronomer. Screening  You may have the following tests or measurements: Height, weight, and BMI. Blood pressure. Lipid and cholesterol levels. These may be checked every 5 years, or more frequently if you are over 45 years old. Skin check. Lung cancer screening. You may have this screening every year starting at age 73 if you have a 30-pack-year history of smoking and currently smoke or have quit within the past 15 years. Fecal occult blood test (FOBT) of the stool. You may have this test every year starting at age 59. Flexible sigmoidoscopy or colonoscopy. You may have a sigmoidoscopy every 5 years or a colonoscopy every 10 years starting at age 25. Prostate cancer screening. Recommendations will vary depending on your family history and other risks. Hepatitis C blood test. Hepatitis B blood test. Sexually transmitted disease (STD) testing. Diabetes screening. This is done by checking your blood sugar (glucose) after you have not eaten for a while (fasting). You may have this done every  1-3 years. Abdominal aortic aneurysm (AAA) screening. You  may need this if you are a current or former smoker. Osteoporosis. You may be screened starting at age 64 if you are at high risk. Talk with your health care provider about your test results, treatment options, and if necessary, the need for more tests. Vaccines  Your health care provider may recommend certain vaccines, such as: Influenza vaccine. This is recommended every year. Tetanus, diphtheria, and acellular pertussis (Tdap, Td) vaccine. You may need a Td booster every 10 years. Zoster vaccine. You may need this after age 64. Pneumococcal 13-valent conjugate (PCV13) vaccine. One dose is recommended after age 76. Pneumococcal polysaccharide (PPSV23) vaccine. One dose is recommended after age 32. Talk to your health care provider about which screenings and vaccines you need and how often you need them. This information is not intended to replace advice given to you by your health care provider. Make sure you discuss any questions you have with your health care provider. Document Released: 10/29/2015 Document Revised: 06/21/2016 Document Reviewed: 08/03/2015 Elsevier Interactive Patient Education  2017 [place] Prevention in the Home Falls can cause injuries. They can happen to people of all ages. There are many things you can do to make your home safe and to help prevent falls. What can I do on the outside of my home? Regularly fix the edges of walkways and driveways and fix any cracks. Remove anything that might make you trip as you walk through a door, such as a raised step or threshold. Trim any bushes or trees on the path to your home. Use bright outdoor lighting. Clear any walking paths of anything that might make someone trip, such as rocks or tools. Regularly check to see if handrails are loose or broken. Make sure that both sides of any steps have handrails. Any raised decks and porches should have guardrails on  the edges. Have any leaves, snow, or ice cleared regularly. Use sand or salt on walking paths during winter. Clean up any spills in your garage right away. This includes oil or grease spills. What can I do in the bathroom? Use night lights. Install grab bars by the toilet and in the tub and shower. Do not use towel bars as grab bars. Use non-skid mats or decals in the tub or shower. If you need to sit down in the shower, use a plastic, non-slip stool. Keep the floor dry. Clean up any water that spills on the floor as soon as it happens. Remove soap buildup in the tub or shower regularly. Attach bath mats securely with double-sided non-slip rug tape. Do not have throw rugs and other things on the floor that can make you trip. What can I do in the bedroom? Use night lights. Make sure that you have a light by your bed that is easy to reach. Do not use any sheets or blankets that are too big for your bed. They should not hang down onto the floor. Have a firm chair that has side arms. You can use this for support while you get dressed. Do not have throw rugs and other things on the floor that can make you trip. What can I do in the kitchen? Clean up any spills right away. Avoid walking on wet floors. Keep items that you use a lot in easy-to-reach places. If you need to reach something above you, use a strong step stool that has a grab bar. Keep electrical cords out of the way. Do not use floor polish or wax that makes  floors slippery. If you must use wax, use non-skid floor wax. Do not have throw rugs and other things on the floor that can make you trip. What can I do with my stairs? Do not leave any items on the stairs. Make sure that there are handrails on both sides of the stairs and use them. Fix handrails that are broken or loose. Make sure that handrails are as long as the stairways. Check any carpeting to make sure that it is firmly attached to the stairs. Fix any carpet that is loose  or worn. Avoid having throw rugs at the top or bottom of the stairs. If you do have throw rugs, attach them to the floor with carpet tape. Make sure that you have a light switch at the top of the stairs and the bottom of the stairs. If you do not have them, ask someone to add them for you. What else can I do to help prevent falls? Wear shoes that: Do not have high heels. Have rubber bottoms. Are comfortable and fit you well. Are closed at the toe. Do not wear sandals. If you use a stepladder: Make sure that it is fully opened. Do not climb a closed stepladder. Make sure that both sides of the stepladder are locked into place. Ask someone to hold it for you, if possible. Clearly mark and make sure that you can see: Any grab bars or handrails. First and last steps. Where the edge of each step is. Use tools that help you move around (mobility aids) if they are needed. These include: Canes. Walkers. Scooters. Crutches. Turn on the lights when you go into a dark area. Replace any light bulbs as soon as they burn out. Set up your furniture so you have a clear path. Avoid moving your furniture around. If any of your floors are uneven, fix them. If there are any pets around you, be aware of where they are. Review your medicines with your doctor. Some medicines can make you feel dizzy. This can increase your chance of falling. Ask your doctor what other things that you can do to help prevent falls. This information is not intended to replace advice given to you by your health care provider. Make sure you discuss any questions you have with your health care provider. Document Released: 07/29/2009 Document Revised: 03/09/2016 Document Reviewed: 11/06/2014 Elsevier Interactive Patient Education  2017 [employer].

## 2022-08-16 ENCOUNTER — Telehealth: Payer: Self-pay | Admitting: Nurse Practitioner

## 2022-08-18 NOTE — Telephone Encounter (Signed)
Called patient but he did not answer. Left message for him to call back.  

## 2022-08-18 NOTE — Telephone Encounter (Signed)
Called and spoke with patient and went over results of his ONO. He verbalized understanding. Nothing further needed

## 2022-09-04 ENCOUNTER — Telehealth: Payer: Medicare Other

## 2022-09-21 DIAGNOSIS — J209 Acute bronchitis, unspecified: Secondary | ICD-10-CM | POA: Diagnosis not present

## 2022-10-15 ENCOUNTER — Other Ambulatory Visit: Payer: Self-pay | Admitting: Internal Medicine

## 2022-10-15 DIAGNOSIS — E118 Type 2 diabetes mellitus with unspecified complications: Secondary | ICD-10-CM

## 2022-11-01 ENCOUNTER — Ambulatory Visit (INDEPENDENT_AMBULATORY_CARE_PROVIDER_SITE_OTHER): Payer: Medicare Other | Admitting: Internal Medicine

## 2022-11-01 ENCOUNTER — Encounter: Payer: Self-pay | Admitting: Internal Medicine

## 2022-11-01 VITALS — BP 126/78 | HR 87 | Temp 98.1°F | Ht 69.0 in | Wt 220.0 lb

## 2022-11-01 DIAGNOSIS — E118 Type 2 diabetes mellitus with unspecified complications: Secondary | ICD-10-CM

## 2022-11-01 DIAGNOSIS — Z0001 Encounter for general adult medical examination with abnormal findings: Secondary | ICD-10-CM | POA: Diagnosis not present

## 2022-11-01 DIAGNOSIS — I10 Essential (primary) hypertension: Secondary | ICD-10-CM

## 2022-11-01 DIAGNOSIS — E781 Pure hyperglyceridemia: Secondary | ICD-10-CM | POA: Diagnosis not present

## 2022-11-01 DIAGNOSIS — E785 Hyperlipidemia, unspecified: Secondary | ICD-10-CM | POA: Diagnosis not present

## 2022-11-01 LAB — LIPID PANEL
Cholesterol: 182 mg/dL (ref 0–200)
HDL: 49.7 mg/dL (ref >39.00)
LDL Cholesterol: 93 mg/dL (ref 0–99)
NonHDL: 132.06
Total CHOL/HDL Ratio: 4
Triglycerides: 194 mg/dL — ABNORMAL HIGH (ref 0.0–149.0)
VLDL: 38.8 mg/dL (ref 0.0–40.0)

## 2022-11-01 LAB — CBC WITH DIFFERENTIAL/PLATELET
Basophils Absolute: 0.2 10*3/uL — ABNORMAL HIGH (ref 0.0–0.1)
Basophils Relative: 2.6 % (ref 0.0–3.0)
Eosinophils Absolute: 0.4 10*3/uL (ref 0.0–0.7)
Eosinophils Relative: 4 % (ref 0.0–5.0)
HCT: 41.2 % (ref 39.0–52.0)
Hemoglobin: 14.3 g/dL (ref 13.0–17.0)
Lymphocytes Relative: 20.4 % (ref 12.0–46.0)
Lymphs Abs: 1.8 10*3/uL (ref 0.7–4.0)
MCHC: 34.7 g/dL (ref 30.0–36.0)
MCV: 89.4 fl (ref 78.0–100.0)
Monocytes Absolute: 0.7 10*3/uL (ref 0.1–1.0)
Monocytes Relative: 7.7 % (ref 3.0–12.0)
Neutro Abs: 5.9 10*3/uL (ref 1.4–7.7)
Neutrophils Relative %: 65.3 % (ref 43.0–77.0)
Platelets: 273 10*3/uL (ref 150.0–400.0)
RBC: 4.61 Mil/uL (ref 4.22–5.81)
RDW: 14.2 % (ref 11.5–15.5)
WBC: 9 10*3/uL (ref 4.0–10.5)

## 2022-11-01 LAB — BASIC METABOLIC PANEL
BUN: 18 mg/dL (ref 6–23)
CO2: 22 mEq/L (ref 19–32)
Calcium: 9.3 mg/dL (ref 8.4–10.5)
Chloride: 105 mEq/L (ref 96–112)
Creatinine, Ser: 1.03 mg/dL (ref 0.40–1.50)
GFR: 74.46 mL/min (ref >60.00)
Glucose, Bld: 123 mg/dL — ABNORMAL HIGH (ref 70–99)
Potassium: 4.2 mEq/L (ref 3.5–5.1)
Sodium: 136 mEq/L (ref 135–145)

## 2022-11-01 LAB — HEPATIC FUNCTION PANEL
ALT: 28 U/L (ref 0–53)
AST: 21 U/L (ref 0–37)
Albumin: 4.4 g/dL (ref 3.5–5.2)
Alkaline Phosphatase: 80 U/L (ref 39–117)
Bilirubin, Direct: 0.1 mg/dL (ref 0.0–0.3)
Total Bilirubin: 0.4 mg/dL (ref 0.2–1.2)
Total Protein: 7 g/dL (ref 6.0–8.3)

## 2022-11-01 LAB — URINALYSIS, ROUTINE W REFLEX MICROSCOPIC
Bilirubin Urine: NEGATIVE
Hgb urine dipstick: NEGATIVE
Ketones, ur: NEGATIVE
Leukocytes,Ua: NEGATIVE
Nitrite: NEGATIVE
RBC / HPF: NONE SEEN (ref 0–?)
Specific Gravity, Urine: 1.01 (ref 1.000–1.030)
Total Protein, Urine: NEGATIVE
Urine Glucose: NEGATIVE
Urobilinogen, UA: 0.2 (ref 0.0–1.0)
WBC, UA: NONE SEEN (ref 0–?)
pH: 5.5 (ref 5.0–8.0)

## 2022-11-01 LAB — MICROALBUMIN / CREATININE URINE RATIO
Creatinine,U: 35.5 mg/dL
Microalb Creat Ratio: 2 mg/g (ref 0.0–30.0)
Microalb, Ur: <0.7 mg/dL (ref 0.0–1.9)

## 2022-11-01 LAB — HEMOGLOBIN A1C: Hgb A1c MFr Bld: 7.5 % — ABNORMAL HIGH (ref 4.6–6.5)

## 2022-11-01 LAB — TSH: TSH: 2.85 u[IU]/mL (ref 0.35–5.50)

## 2022-11-01 MED ORDER — OLMESARTAN MEDOXOMIL 20 MG PO TABS: 20.0000 mg | ORAL_TABLET | Freq: Every day | ORAL | 1 refills | Status: DC

## 2022-11-01 MED ORDER — RYBELSUS 3 MG PO TABS: 3.0000 mg | ORAL_TABLET | Freq: Every day | ORAL | 0 refills | Status: DC

## 2022-11-01 NOTE — Progress Notes (Signed)
Subjective:  Patient ID: [name], male    DOB: [birth year]  Age: 67 y.o. MRN: [id number]  CC: Annual Exam, Hypertension, Hyperlipidemia, and Diabetes   HPI [name] presents for a CPX and f/up -  He complains that his systolic blood pressure was down to 80s so he decreased the ARB dosage by 50% and his blood pressure has normalized.  He has baseline, unchanged shortness of breath.  He is active and denies diaphoresis, chest pain, palpitations, or edema.  Outpatient Medications Prior to Visit  Medication Sig Dispense Refill  . aspirin 81 MG EC tablet Take 81 mg by mouth at bedtime.    . Blood Glucose Monitoring Suppl (ONETOUCH VERIO) w/Device KIT 1 Act by Does not apply route in the morning and at bedtime. 1 kit 2  . Carboxymethylcellul-Glycerin (LUBRICATING EYE DROPS OP) Place 1 drop into both eyes daily as needed (dry eyes).    . CVS D3 50 MCG (2000 UT) CAPS TAKE 2 CAPSULES (4,000 UNITS TOTAL) BY MOUTH DAILY. 180 capsule 1  . ezetimibe (ZETIA) 10 MG tablet TAKE 1 TABLET BY MOUTH EVERY DAY 90 tablet 1  . famotidine (PEPCID) 40 MG tablet TAKE 1 TABLET BY MOUTH EVERY DAY (Patient taking differently: Take 40 mg by mouth daily as needed.) 90 tablet 1  . fexofenadine (ALLEGRA) 180 MG tablet Take 180 mg by mouth daily.    Marland Kitchen glucose blood (ONETOUCH VERIO) test strip 1 each by Other route in the morning and at bedtime. Use as instructed 200 each 3  . ibuprofen (ADVIL) 200 MG tablet Take 400 mg by mouth every 6 (six) hours as needed for moderate pain.    Marland Kitchen KLOR-CON M20 20 MEQ tablet TAKE 1 TABLET BY MOUTH TWICE A DAY 180 tablet 0  . KRILL OIL PO Take 400 mg by mouth daily.    . Lancets (ONETOUCH ULTRASOFT) lancets     . metFORMIN (GLUCOPHAGE) 1000 MG tablet TAKE 1 TABLET (1,000 MG TOTAL) BY MOUTH TWICE A DAY WITH FOOD 180 tablet 0  . NON FORMULARY Oxygen continuous 1.5 liters with activity    . ONETOUCH ULTRA test strip USE TO TEST UP TO 4 TIMES DAILY AS DIRECTED 100 strip 5  .  ONETOUCH VERIO test strip USE 1 PER DAY 100 each 2  . sodium chloride (OCEAN) 0.65 % SOLN nasal spray Place 1 spray into both nostrils as needed for congestion.    . Tetrahydrozoline HCl (VISINE OP) Place 1 drop into both eyes daily as needed (allergies).    . Tiotropium Bromide-Olodaterol (STIOLTO RESPIMAT) 2.5-2.5 MCG/ACT AERS Inhale 2 puffs into the lungs daily. (Patient taking differently: Inhale 2 puffs into the lungs daily as needed (shortness of breath).) 12 g 1  . trolamine salicylate (ASPERCREME) 10 % cream Apply 1 Application topically as needed for muscle pain.    Marland Kitchen olmesartan (BENICAR) 40 MG tablet Take 1 tablet (40 mg total) by mouth daily. (Patient taking differently: Take 20 mg by mouth daily.) 90 tablet 1   No facility-administered medications prior to visit.    ROS Review of Systems  Constitutional:  Negative for chills, diaphoresis, fatigue and fever.  HENT: Negative.    Eyes: Negative.   Respiratory:  Positive for shortness of breath. Negative for cough, chest tightness, wheezing and stridor.   Cardiovascular:  Negative for chest pain, palpitations and leg swelling.  Gastrointestinal:  Negative for abdominal pain, constipation, diarrhea, nausea and vomiting.  Endocrine: Negative.   Genitourinary: Negative.  Negative for difficulty urinating.  Musculoskeletal: Negative.  Negative for arthralgias and myalgias.  Skin: Negative.  Negative for color change.  Neurological: Negative.  Negative for dizziness and weakness.  Hematological:  Negative for adenopathy. Does not bruise/bleed easily.  Psychiatric/Behavioral: Negative.      Objective:  BP 126/78 (BP Location: Left Arm, Patient Position: Sitting, Cuff Size: Large)   Pulse 87   Temp 98.1 F (36.7 C) (Oral)   Ht 5\' 9"  (1.753 m)   Wt 220 lb (99.8 kg)   SpO2 94%   BMI 32.49 kg/m   BP Readings from Last 3 Encounters:  11/01/22 126/78  06/22/22 (!) 140/80  06/13/22 (!) 124/93    Wt Readings from Last 3  Encounters:  11/01/22 220 lb (99.8 kg)  08/11/22 215 lb (97.5 kg)  06/22/22 213 lb (96.6 kg)    Physical Exam Vitals reviewed.  Constitutional:      Appearance: Normal appearance. He is not ill-appearing.  HENT:     Nose: Nose normal.     Mouth/Throat:     Mouth: Mucous membranes are moist.  Eyes:     General: No scleral icterus.    Conjunctiva/sclera: Conjunctivae normal.  Cardiovascular:     Rate and Rhythm: Normal rate and regular rhythm.     Heart sounds: No murmur heard.    No friction rub. No gallop.     Comments: EKG- ++artifact NSR, 84 bpm Minimal LVH Low voltage in V6 is not new No Q waves Pulmonary:     Effort: Pulmonary effort is normal.     Breath sounds: Normal breath sounds. No decreased breath sounds, wheezing, rhonchi or rales.  Abdominal:     General: Abdomen is flat.     Palpations: There is no mass.     Tenderness: There is no abdominal tenderness. There is no guarding.     Hernia: No hernia is present. There is no hernia in the left inguinal area or right inguinal area.  Genitourinary:    Pubic Area: No rash.      Penis: Normal and circumcised.      Testes: Normal.     Epididymis:     Right: Normal.     Left: Normal.     Prostate: Normal. Not enlarged, not tender and no nodules present.     Rectum: Normal. Guaiac result negative. No mass, tenderness, anal fissure, external hemorrhoid or internal hemorrhoid. Normal anal tone.  Musculoskeletal:        General: Normal range of motion.     Cervical back: Neck supple.     Right lower leg: No edema.     Left lower leg: No edema.  Lymphadenopathy:     Cervical: No cervical adenopathy.     Lower Body: No right inguinal adenopathy. No left inguinal adenopathy.  Skin:    General: Skin is warm and dry.     Coloration: Skin is not pale.  Neurological:     General: No focal deficit present.     Mental Status: He is alert. Mental status is at baseline.  Psychiatric:        Mood and Affect: Mood normal.         Behavior: Behavior normal.    Lab Results  Component Value Date   WBC 9.0 11/01/2022   HGB 14.3 11/01/2022   HCT 41.2 11/01/2022   PLT 273.0 11/01/2022   GLUCOSE 123 (H) 11/01/2022   CHOL 182 11/01/2022   TRIG 194.0 (H) 11/01/2022   HDL  49.70 11/01/2022   LDLDIRECT 117.0 04/26/2022   LDLCALC 93 11/01/2022   ALT 28 11/01/2022   AST 21 11/01/2022   NA 136 11/01/2022   K 4.2 11/01/2022   CL 105 11/01/2022   CREATININE 1.03 11/01/2022   BUN 18 11/01/2022   CO2 22 11/01/2022   TSH 2.85 11/01/2022   PSA 0.58 10/26/2021   INR 1.2 08/04/2008   HGBA1C 7.5 (H) 11/01/2022   MICROALBUR <0.7 11/01/2022    DG Chest Port 1 View  Result Date: 06/13/2022 CLINICAL DATA:  Status post bronchoscopy and biopsy of right upper lobe nodule EXAM: PORTABLE CHEST - 1 VIEW COMPARISON:  01/30/2014 FINDINGS: Cardiomediastinal silhouette and pulmonary vasculature are within normal limits. Advanced emphysematous changes of the lungs again seen. Known right upper lobe pulmonary nodule is better visualized on prior CT. No pneumothorax. IMPRESSION: No pneumothorax status post right upper lobe mass biopsy. Electronically Signed   By: Miachel Roux M.D.   On: 06/13/2022 08:59   DG C-ARM BRONCHOSCOPY  Result Date: 06/13/2022 C-ARM BRONCHOSCOPY: Fluoroscopy was utilized by the requesting physician.  No radiographic interpretation.      Assessment & Plan:   Ruslan was seen today for annual exam, hypertension, hyperlipidemia and diabetes.  Diagnoses and all orders for this visit:  Essential hypertension, benign- His blood pressure is adequately well-controlled.  Will continue the current dose of the ARB. -     Basic metabolic panel; Future -     CBC with Differential/Platelet; Future -     Hepatic function panel; Future -     TSH; Future -     EKG 12-Lead -     Urinalysis, Routine w reflex microscopic; Future -     olmesartan (BENICAR) 20 MG tablet; Take 1 tablet (20 mg total) by mouth daily. -      Urinalysis, Routine w reflex microscopic -     TSH -     Hepatic function panel -     CBC with Differential/Platelet -     Basic metabolic panel  Type II diabetes mellitus with manifestations ([place])- His A1c is up to 7.5%.  Will add an GLP-1 agonist to his current regimen. -     Basic metabolic panel; Future -     Microalbumin / creatinine urine ratio; Future -     Hemoglobin A1c; Future -     HM Diabetes Foot Exam -     olmesartan (BENICAR) 20 MG tablet; Take 1 tablet (20 mg total) by mouth daily. -     Hemoglobin A1c -     Microalbumin / creatinine urine ratio -     Basic metabolic panel -     Semaglutide (RYBELSUS) 3 MG TABS; Take 3 mg by mouth daily.  Pure hyperglyceridemia -     Lipid panel; Future -     Hepatic function panel; Future -     Hepatic function panel -     Lipid panel  Hyperlipidemia with target LDL less than 130- He is not willing to take a statin. -     TSH; Future -     TSH  Encounter for general adult medical examination with abnormal findings- Exam completed, labs reviewed, vaccines reviewed and updated, cancer screenings are up-to-date, patient education was given.  Other orders -     Cancel: PSA; Future -     Cancel: Urinalysis, Routine w reflex microscopic; Future   I have discontinued Laythan Hayter. Krysiak's olmesartan. I am also having him start on  olmesartan and Rybelsus. Additionally, I am having him maintain his aspirin EC, NON FORMULARY, fexofenadine, OneTouch Verio, CVS D3, onetouch ultrasoft, OneTouch Verio, OneTouch Verio, [place]lor-Con M20, famotidine, KRILL OIL PO, Stiolto Respimat, ibuprofen, Carboxymethylcellul-Glycerin (LUBRICATING EYE DROPS OP), Tetrahydrozoline HCl (VISINE OP), sodium chloride, trolamine salicylate, OneTouch Ultra, ezetimibe, and metFORMIN.  Meds ordered this encounter  Medications  . olmesartan (BENICAR) 20 MG tablet    Sig: Take 1 tablet (20 mg total) by mouth daily.    Dispense:  90 tablet    Refill:  1  . Semaglutide  (RYBELSUS) 3 MG TABS    Sig: Take 3 mg by mouth daily.    Dispense:  30 tablet    Refill:  0     Follow-up: Return in about 6 months (around 05/02/2023).  Scarlette Calico, MD

## 2022-11-01 NOTE — Patient Instructions (Signed)
Health Maintenance, Male Adopting a healthy lifestyle and getting preventive care are important in promoting health and wellness. Ask your health care provider about: The right schedule for you to have regular tests and exams. Things you can do on your own to prevent diseases and keep yourself healthy. What should I know about diet, weight, and exercise? Eat a healthy diet  Eat a diet that includes plenty of vegetables, fruits, low-fat dairy products, and lean protein. Do not eat a lot of foods that are high in solid fats, added sugars, or sodium. Maintain a healthy weight Body mass index (BMI) is a measurement that can be used to identify possible weight problems. It estimates body fat based on height and weight. Your health care provider can help determine your BMI and help you achieve or maintain a healthy weight. Get regular exercise Get regular exercise. This is one of the most important things you can do for your health. Most adults should: Exercise for at least 150 minutes each week. The exercise should increase your heart rate and make you sweat (moderate-intensity exercise). Do strengthening exercises at least twice a week. This is in addition to the moderate-intensity exercise. Spend less time sitting. Even light physical activity can be beneficial. Watch cholesterol and blood lipids Have your blood tested for lipids and cholesterol at 69 years of age, then have this test every 5 years. You may need to have your cholesterol levels checked more often if: Your lipid or cholesterol levels are high. You are older than 69 years of age. You are at high risk for heart disease. What should I know about cancer screening? Many types of cancers can be detected early and may often be prevented. Depending on your health history and family history, you may need to have cancer screening at various ages. This may include screening for: Colorectal cancer. Prostate cancer. Skin cancer. Lung  cancer. What should I know about heart disease, diabetes, and high blood pressure? Blood pressure and heart disease High blood pressure causes heart disease and increases the risk of stroke. This is more likely to develop in people who have high blood pressure readings or are overweight. Talk with your health care provider about your target blood pressure readings. Have your blood pressure checked: Every 3-5 years if you are 18-39 years of age. Every year if you are 40 years old or older. If you are between the ages of 65 and 75 and are a current or former smoker, ask your health care provider if you should have a one-time screening for abdominal aortic aneurysm (AAA). Diabetes Have regular diabetes screenings. This checks your fasting blood sugar level. Have the screening done: Once every three years after age 45 if you are at a normal weight and have a low risk for diabetes. More often and at a younger age if you are overweight or have a high risk for diabetes. What should I know about preventing infection? Hepatitis B If you have a higher risk for hepatitis B, you should be screened for this virus. Talk with your health care provider to find out if you are at risk for hepatitis B infection. Hepatitis C Blood testing is recommended for: Everyone born from 1945 through 1965. Anyone with known risk factors for hepatitis C. Sexually transmitted infections (STIs) You should be screened each year for STIs, including gonorrhea and chlamydia, if: You are sexually active and are younger than 69 years of age. You are older than 69 years of age and your   health care provider tells you that you are at risk for this type of infection. Your sexual activity has changed since you were last screened, and you are at increased risk for chlamydia or gonorrhea. Ask your health care provider if you are at risk. Ask your health care provider about whether you are at high risk for HIV. Your health care provider  may recommend a prescription medicine to help prevent HIV infection. If you choose to take medicine to prevent HIV, you should first get tested for HIV. You should then be tested every 3 months for as long as you are taking the medicine. Follow these instructions at home: Alcohol use Do not drink alcohol if your health care provider tells you not to drink. If you drink alcohol: Limit how much you have to 0-2 drinks a day. Know how much alcohol is in your drink. In the [place] one drink equals one 12 oz bottle of beer (355 mL), one 5 oz glass of wine (148 mL), or one 1 oz glass of hard liquor (44 mL). Lifestyle Do not use any products that contain nicotine or tobacco. These products include cigarettes, chewing tobacco, and vaping devices, such as e-cigarettes. If you need help quitting, ask your health care provider. Do not use street drugs. Do not share needles. Ask your health care provider for help if you need support or information about quitting drugs. General instructions Schedule regular health, dental, and eye exams. Stay current with your vaccines. Tell your health care provider if: You often feel depressed. You have ever been abused or do not feel safe at home. Summary Adopting a healthy lifestyle and getting preventive care are important in promoting health and wellness. Follow your health care provider's instructions about healthy diet, exercising, and getting tested or screened for diseases. Follow your health care provider's instructions on monitoring your cholesterol and blood pressure. This information is not intended to replace advice given to you by your health care provider. Make sure you discuss any questions you have with your health care provider. Document Revised: 02/21/2021 Document Reviewed: 02/21/2021 Elsevier Patient Education  2023 Elsevier Inc.  

## 2022-11-03 ENCOUNTER — Encounter: Payer: Self-pay | Admitting: Internal Medicine

## 2022-12-14 ENCOUNTER — Encounter (HOSPITAL_BASED_OUTPATIENT_CLINIC_OR_DEPARTMENT_OTHER): Payer: Self-pay

## 2022-12-14 ENCOUNTER — Ambulatory Visit (HOSPITAL_BASED_OUTPATIENT_CLINIC_OR_DEPARTMENT_OTHER)
Admission: RE | Admit: 2022-12-14 | Discharge: 2022-12-14 | Disposition: A | Discharge status: Home / Self Care | Payer: Medicare Other | Source: Ambulatory Visit | Attending: Pulmonary Disease | Admitting: Pulmonary Disease

## 2022-12-14 DIAGNOSIS — R911 Solitary pulmonary nodule: Secondary | ICD-10-CM

## 2022-12-14 DIAGNOSIS — J439 Emphysema, unspecified: Secondary | ICD-10-CM | POA: Diagnosis not present

## 2022-12-14 DIAGNOSIS — R918 Other nonspecific abnormal finding of lung field: Secondary | ICD-10-CM | POA: Diagnosis not present

## 2022-12-26 ENCOUNTER — Telehealth: Payer: Self-pay | Admitting: Pulmonary Disease

## 2022-12-26 NOTE — Telephone Encounter (Signed)
Left voicemail for patient to call back to schedule office visit with Dr. Valeta Harms to discuss CT results and next steps.

## 2022-12-27 NOTE — Telephone Encounter (Signed)
Patient is scheduled 3/15 with Dr. Valeta Harms

## 2022-12-29 ENCOUNTER — Ambulatory Visit: Payer: Medicare Other | Admitting: Pulmonary Disease

## 2022-12-29 ENCOUNTER — Encounter: Payer: Self-pay | Admitting: Pulmonary Disease

## 2022-12-29 VITALS — BP 100/80 | HR 90 | Ht 69.0 in | Wt 217.2 lb

## 2022-12-29 DIAGNOSIS — I7123 Aneurysm of the descending thoracic aorta, without rupture: Secondary | ICD-10-CM | POA: Diagnosis not present

## 2022-12-29 DIAGNOSIS — R911 Solitary pulmonary nodule: Secondary | ICD-10-CM | POA: Diagnosis not present

## 2022-12-29 DIAGNOSIS — J432 Centrilobular emphysema: Secondary | ICD-10-CM | POA: Diagnosis not present

## 2022-12-29 DIAGNOSIS — J438 Other emphysema: Secondary | ICD-10-CM

## 2022-12-29 NOTE — Progress Notes (Signed)
Synopsis: Referred in August 2023 for lung mass by [name], MD  Subjective:   PATIENT ID: [name] GENDER: male DOB: 1955-11-17, MRN: [id number]  Chief Complaint  Patient presents with   Follow-up    F/up CT    This is a 67 year old gentleman, past medical history of hyperlipidemia, hypertension, COPD, interstitial lung disease, longstanding history of tobacco abuse, quit in 2005.  Approximately 16-pack-year history of smoking.Patient had a CT scan of the chest on 05/03/2022 CT angiography showed interval development of a 15 x 10 mm irregular density in the right upper lobe concerning for malignancy.  This was completed by Dr. Ronnald Ramp and patient was referred for evaluation.  He had usually had the CT imaging completed on a yearly basis for follow-up in a ascending thoracic aneurysm.  This ascending thoracic aneurysm measures 4 cm and has been stable for some time.  However from his previous scan to his new scan he now has a new lung nodule concerning for malignancy.  Patient used to follow with Dr. Halford Chessman but has not been seen in our clinic in greater than 5 years.  Here to establish care with new primary pulmonary provider.  OV 12/29/2022: Here today for follow-up after recent CT imaging.  Lung nodule that was found in the upper lobe of the lung appears smaller in size however it starts to have some central cavitation.  This is likely inflammatory.  We reviewed images today in the office.  Does have significant emphysema.  Still on Stiolto.  Having some shortness of breath at times.  He recently had episode in which he had exertional dyspnea.    Past Medical History:  Diagnosis Date   Aortic aneurysm (HCC)    COPD (chronic obstructive pulmonary disease) ([place]) 09/23/2008   -PFT 09/23/08 FEV1 2.89(84%), FVC 3.70 (78%), TLC 5.28 (78%), DLCO 64% mixed obstructive and  restrictive pattern.  Spirometry 03/11/09 FEV 3.35 (97%),FVC 4.10 (86%), FEV% 82. Steroid responsive pneumonitis 04/09-  required mechanical ventilation during hospitalization - The following hospital test were negative: PCP,ANA,HIV,BAL. - ACE25,RF20,CRP 28,A1AT 263, ESR 109   Depression    Diabetes mellitus without complication (HCC)    Dyslipidemia    Dyslipidemia    Emphysema/COPD (HCC)    GERD (gastroesophageal reflux disease)    History of skin cancer    Hypertension    Echo 10/09 EF 60%   ILD (interstitial lung disease) ([place])    Panic attack    Pneumonia    2009 "put on a vent"     Family History  Problem Relation Age of Onset   Heart attack Father 47   Diabetes Father    Heart attack Brother    Emphysema Brother    Diabetes Brother      Past Surgical History:  Procedure Laterality Date   BRONCHIAL WASHINGS  06/13/2022   Procedure: BRONCHIAL WASHINGS;  Surgeon: Garner Nash, DO;  Location: [facility];  Service: Pulmonary;;   BRONCHOSCOPY  10/09   Ganglion cyst on foot     HERNIA REPAIR     2 hernia   Left shoulder surgery      Social History   Socioeconomic History   Marital status: Divorced    Spouse name: Not on file   Number of children: 0   Years of education: 10th grade returned for GED   Highest education level: GED or equivalent  Occupational History   Occupation: Scientist, water quality: UNEMPLOYED    Comment:  manufacturer company  Tobacco Use   Smoking status: Former    Packs/day: 1.00    Years: 16.00    Additional pack years: 0.00    Total pack years: 16.00    Types: Cigarettes    Quit date: 10/17/2003    Years since quitting: 19.2    Passive exposure: Past   Smokeless tobacco: Never  Vaping Use   Vaping Use: Never used  Substance and Sexual Activity   Alcohol use: No    Comment: Rare beer 3-4 times per year   Drug use: No   Sexual activity: Not Currently  Other Topics Concern   Not on file  Social History Narrative   Divorced. 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    Social Determinants of Health   Financial Resource Strain: Low Risk  (08/11/2022)   Overall Financial Resource Strain (CARDIA)    Difficulty of Paying Living Expenses: Not hard at all  Food Insecurity: No Food Insecurity (08/11/2022)   Hunger Vital Sign    Worried About Running Out of Food in the Last Year: Never true    Ran Out of Food in the Last Year: Never true  Transportation Needs: No Transportation Needs (08/11/2022)   PRAPARE - Hydrologist (Medical): No    Lack of Transportation (Non-Medical): No  Physical Activity: Insufficiently Active (08/11/2022)   Exercise Vital Sign    Days of Exercise per Week: 3 days    Minutes of Exercise per Session: 30 min  Stress: No Stress Concern Present (08/11/2022)   [place]    Feeling of Stress : Not at all  Social Connections: Moderately Isolated (08/11/2022)   Social Connection and Isolation Panel [NHANES]    Frequency of Communication with Friends and Family: More than three times a week    Frequency of Social Gatherings with Friends and Family: More than three times a week    Attends Religious Services: 1 to 4 times per year    Active Member of [employer] or Organizations: No    Attends Archivist Meetings: Never    Marital Status: Never married  Intimate Partner Violence: Not At Risk (08/11/2022)   Humiliation, Afraid, Rape, and Kick questionnaire    Fear of Current or Ex-Partner: No    Emotionally Abused: No    Physically Abused: No    Sexually Abused: No     Allergies  Allergen Reactions   Morphine Itching and Nausea And Vomiting   Statins     weakness, muscle pains   Latex Rash     Outpatient Medications Prior to Visit  Medication Sig Dispense  Refill   aspirin 81 MG EC tablet Take 81 mg by mouth at bedtime.     Blood Glucose Monitoring Suppl (ONETOUCH VERIO) w/Device KIT 1 Act by Does not apply route in the morning and at bedtime. 1 kit 2   Carboxymethylcellul-Glycerin (LUBRICATING EYE DROPS OP) Place 1 drop into both eyes daily as needed (dry eyes).     [employer]S D3 50 MCG (2000 UT) CAPS TAKE 2 CAPSULES (4,000 UNITS TOTAL) BY MOUTH DAILY. 180 capsule 1  ezetimibe (ZETIA) 10 MG tablet TAKE 1 TABLET BY MOUTH EVERY DAY 90 tablet 1   famotidine (PEPCID) 40 MG tablet TAKE 1 TABLET BY MOUTH EVERY DAY 90 tablet 1   fexofenadine (ALLEGRA) 180 MG tablet Take 180 mg by mouth daily.     glucose blood (ONETOUCH VERIO) test strip 1 each by Other route in the morning and at bedtime. Use as instructed 200 each 3   ibuprofen (ADVIL) 200 MG tablet Take 400 mg by mouth every 6 (six) hours as needed for moderate pain.     KLOR-CON M20 20 MEQ tablet TAKE 1 TABLET BY MOUTH TWICE A DAY 180 tablet 0   KRILL OIL PO Take 400 mg by mouth daily.     metFORMIN (GLUCOPHAGE) 1000 MG tablet TAKE 1 TABLET (1,000 MG TOTAL) BY MOUTH TWICE A DAY WITH FOOD 180 tablet 0   NON FORMULARY Oxygen continuous 1.5 liters with activity     olmesartan (BENICAR) 20 MG tablet Take 1 tablet (20 mg total) by mouth daily. 90 tablet 1   sodium chloride (OCEAN) 0.65 % SOLN nasal spray Place 1 spray into both nostrils as needed for congestion.     Tetrahydrozoline HCl (VISINE OP) Place 1 drop into both eyes daily as needed (allergies).     Tiotropium Bromide-Olodaterol (STIOLTO RESPIMAT) 2.5-2.5 MCG/ACT AERS Inhale 2 puffs into the lungs daily. (Patient taking differently: Inhale 2 puffs into the lungs daily as needed (shortness of breath).) 12 g 1   trolamine salicylate (ASPERCREME) 10 % cream Apply 1 Application topically as needed for muscle pain.     Lancets (ONETOUCH ULTRASOFT) lancets  (Patient not taking: Reported on 12/29/2022)     ONETOUCH ULTRA test strip USE TO TEST UP TO 4 TIMES  DAILY AS DIRECTED (Patient not taking: Reported on 12/29/2022) 100 strip 5   ONETOUCH VERIO test strip USE 1 PER DAY (Patient not taking: Reported on 12/29/2022) 100 each 2   Semaglutide (RYBELSUS) 3 MG TABS Take 3 mg by mouth daily. (Patient not taking: Reported on 12/29/2022) 30 tablet 0   No facility-administered medications prior to visit.    Review of Systems  Constitutional:  Negative for chills, fever, malaise/fatigue and weight loss.  HENT:  Negative for hearing loss, sore throat and tinnitus.   Eyes:  Negative for blurred vision and double vision.  Respiratory:  Positive for shortness of breath. Negative for cough, hemoptysis, sputum production, wheezing and stridor.   Cardiovascular:  Negative for chest pain, palpitations, orthopnea, leg swelling and PND.  Gastrointestinal:  Negative for abdominal pain, constipation, diarrhea, heartburn, nausea and vomiting.  Genitourinary:  Negative for dysuria, hematuria and urgency.  Musculoskeletal:  Negative for joint pain and myalgias.  Skin:  Negative for itching and rash.  Neurological:  Negative for dizziness, tingling, weakness and headaches.  Endo/Heme/Allergies:  Negative for environmental allergies. Does not bruise/bleed easily.  Psychiatric/Behavioral:  Negative for depression. The patient is not nervous/anxious and does not have insomnia.   All other systems reviewed and are negative.    Objective:  Physical Exam Vitals reviewed.  Constitutional:      General: He is not in acute distress.    Appearance: He is well-developed.  HENT:     Head: Normocephalic and atraumatic.  Eyes:     General: No scleral icterus.    Conjunctiva/sclera: Conjunctivae normal.     Pupils: Pupils are equal, round, and reactive to light.  Neck:     Vascular: No JVD.  Trachea: No tracheal deviation.  Cardiovascular:     Rate and Rhythm: Normal rate and regular rhythm.     Heart sounds: Normal heart sounds. No murmur heard. Pulmonary:      Effort: Pulmonary effort is normal. No tachypnea, accessory muscle usage or respiratory distress.     Breath sounds: No stridor. No wheezing, rhonchi or rales.     Comments: Severely diminished breath sounds bilaterally Abdominal:     General: There is no distension.     Palpations: Abdomen is soft.     Tenderness: There is no abdominal tenderness.  Musculoskeletal:        General: No tenderness.     Cervical back: Neck supple.  Lymphadenopathy:     Cervical: No cervical adenopathy.  Skin:    General: Skin is warm and dry.     Capillary Refill: Capillary refill takes less than 2 seconds.     Findings: No rash.  Neurological:     Mental Status: He is alert and oriented to person, place, and time.  Psychiatric:        Behavior: Behavior normal.      Vitals:   12/29/22 1132  BP: 100/80  Pulse: 90  SpO2: 95%  Weight: 217 lb 3.2 oz (98.5 kg)  Height: 5\' 9"  (1.753 m)   95% on RA BMI Readings from Last 3 Encounters:  12/29/22 32.07 kg/m  11/01/22 32.49 kg/m  08/11/22 31.75 kg/m   Wt Readings from Last 3 Encounters:  12/29/22 217 lb 3.2 oz (98.5 kg)  11/01/22 220 lb (99.8 kg)  08/11/22 215 lb (97.5 kg)     CBC    Component Value Date/Time   WBC 9.0 11/01/2022 1150   RBC 4.61 11/01/2022 1150   HGB 14.3 11/01/2022 1150   HCT 41.2 11/01/2022 1150   PLT 273.0 11/01/2022 1150   MCV 89.4 11/01/2022 1150   MCHC 34.7 11/01/2022 1150   RDW 14.2 11/01/2022 1150   LYMPHSABS 1.8 11/01/2022 1150   MONOABS 0.7 11/01/2022 1150   EOSABS 0.4 11/01/2022 1150   BASOSABS 0.2 (H) 11/01/2022 1150    Chest Imaging:  May 04, 2022 CT chest: 15 mm right upper lobe pulmonary nodule, severe paraseptal and centrilobular emphysema. The patient's images have been independently reviewed by me.    March 2024 CT chest: Right upper lobe pulmonary nodule smaller than was before.  Does have central cavitation.  Needs can continued CT surveillance.  Has significant emphysema.  He does have  a aneurysmal dilatation that needs to be followed.  We will have this done next image with contrast for follow-up of the aneurysm. The patient's images have been independently reviewed by me.    Pulmonary Functions Testing Results:    Latest Ref Rng & Units 07/01/2018    1:51 PM 03/12/2014   12:54 PM  PFT Results  FVC-Pre L 4.62  4.52   FVC-Predicted Pre % 111  102   FVC-Post L 4.58  4.40   FVC-Predicted Post % 110  99   Pre FEV1/FVC % % 80  81   Post FEV1/FCV % % 80  82   FEV1-Pre L 3.70  3.68   FEV1-Predicted Pre % 119  109   FEV1-Post L 3.69  3.63   DLCO uncorrected ml/min/mmHg 20.33  21.05   DLCO UNC% % 71  71   DLVA Predicted % 81  101   TLC L 6.13    TLC % Predicted % 95    RV % Predicted %  72      FeNO:   Pathology:   Echocardiogram:   Heart Catheterization:     Assessment & Plan:     ICD-10-CM   1. Lung nodule  R91.1 CT ANGIO CHEST AORTA W/CM & OR WO/CM    2. Aneurysm of descending thoracic aorta without rupture (HCC)  I71.23 CT ANGIO CHEST AORTA W/CM & OR WO/CM    3. EMPHYSEMA  J43.8     4. Centrilobular emphysema ([place])  J43.2     5. Paraseptal emphysema ([place])  J43.8       Discussion:  This is a 40 year old gentleman, right upper lobe pulmonary nodule incidentally found on CT.  He also has a thoracic aneurysm.  He has significant centrilobular and paraseptal emphysema  Plan: Follow-up CT imaging shows stability of this nodule that is little bit smaller in size but now has central cavitation. Will repeat CT imaging in approximately 4 months. This will also coincide with a CTA for the evaluation of his thoracic aneurysm. Will switch his inhaler regiment from [employer] to [employer]. Will also follow-up at that time to see if there is any improvement in some of his respiratory symptoms.  RTC with me or SG, NP after CT a complete in July 2024.   Current Outpatient Medications:    aspirin 81 MG EC tablet, Take 81 mg by mouth at bedtime., Disp: , Rfl:     Blood Glucose Monitoring Suppl (ONETOUCH VERIO) w/Device KIT, 1 Act by Does not apply route in the morning and at bedtime., Disp: 1 kit, Rfl: 2   Carboxymethylcellul-Glycerin (LUBRICATING EYE DROPS OP), Place 1 drop into both eyes daily as needed (dry eyes)., Disp: , Rfl:    CVS D3 50 MCG (2000 UT) CAPS, TAKE 2 CAPSULES (4,000 UNITS TOTAL) BY MOUTH DAILY., Disp: 180 capsule, Rfl: 1   ezetimibe (ZETIA) 10 MG tablet, TAKE 1 TABLET BY MOUTH EVERY DAY, Disp: 90 tablet, Rfl: 1   famotidine (PEPCID) 40 MG tablet, TAKE 1 TABLET BY MOUTH EVERY DAY, Disp: 90 tablet, Rfl: 1   fexofenadine (ALLEGRA) 180 MG tablet, Take 180 mg by mouth daily., Disp: , Rfl:    glucose blood (ONETOUCH VERIO) test strip, 1 each by Other route in the morning and at bedtime. Use as instructed, Disp: 200 each, Rfl: 3   ibuprofen (ADVIL) 200 MG tablet, Take 400 mg by mouth every 6 (six) hours as needed for moderate pain., Disp: , Rfl:    KLOR-CON M20 20 MEQ tablet, TAKE 1 TABLET BY MOUTH TWICE A DAY, Disp: 180 tablet, Rfl: 0   KRILL OIL PO, Take 400 mg by mouth daily., Disp: , Rfl:    metFORMIN (GLUCOPHAGE) 1000 MG tablet, TAKE 1 TABLET (1,000 MG TOTAL) BY MOUTH TWICE A DAY WITH FOOD, Disp: 180 tablet, Rfl: 0   NON FORMULARY, Oxygen continuous 1.5 liters with activity, Disp: , Rfl:    olmesartan (BENICAR) 20 MG tablet, Take 1 tablet (20 mg total) by mouth daily., Disp: 90 tablet, Rfl: 1   sodium chloride (OCEAN) 0.65 % SOLN nasal spray, Place 1 spray into both nostrils as needed for congestion., Disp: , Rfl:    Tetrahydrozoline HCl (VISINE OP), Place 1 drop into both eyes daily as needed (allergies)., Disp: , Rfl:    Tiotropium Bromide-Olodaterol (STIOLTO RESPIMAT) 2.5-2.5 MCG/ACT AERS, Inhale 2 puffs into the lungs daily. (Patient taking differently: Inhale 2 puffs into the lungs daily as needed (shortness of breath).), Disp: 12 g, Rfl: 1   trolamine salicylate (  ASPERCREME) 10 % cream, Apply 1 Application topically as needed for  muscle pain., Disp: , Rfl:    Lancets (ONETOUCH ULTRASOFT) lancets, , Disp: , Rfl:    ONETOUCH ULTRA test strip, USE TO TEST UP TO 4 TIMES DAILY AS DIRECTED (Patient not taking: Reported on 12/29/2022), Disp: 100 strip, Rfl: 5   ONETOUCH VERIO test strip, USE 1 PER DAY (Patient not taking: Reported on 12/29/2022), Disp: 100 each, Rfl: 2   Semaglutide (RYBELSUS) 3 MG TABS, Take 3 mg by mouth daily. (Patient not taking: Reported on 12/29/2022), Disp: 30 tablet, Rfl: 0    Garner Nash, DO [facility] 12/29/2022 12:00 PM

## 2022-12-29 NOTE — Patient Instructions (Addendum)
Thank you for visiting Dr. Valeta Harms at [facility]. Today we recommend the following:  Orders Placed This Encounter  Procedures   CT ANGIO CHEST AORTA W/CM & OR WO/CM   CT for follow up of aneursym and lung nodule  Samples of Breztri and new prescription  Return in about 4 months (around 04/30/2023) for with Eric Form, NP, or Dr. Valeta Harms.    Please do your part to reduce the spread of COVID-19.

## 2023-01-08 ENCOUNTER — Other Ambulatory Visit: Payer: Self-pay | Admitting: Internal Medicine

## 2023-01-08 DIAGNOSIS — E118 Type 2 diabetes mellitus with unspecified complications: Secondary | ICD-10-CM

## 2023-02-19 ENCOUNTER — Other Ambulatory Visit: Payer: Self-pay | Admitting: Internal Medicine

## 2023-02-19 DIAGNOSIS — I10 Essential (primary) hypertension: Secondary | ICD-10-CM

## 2023-02-19 DIAGNOSIS — E118 Type 2 diabetes mellitus with unspecified complications: Secondary | ICD-10-CM

## 2023-02-26 ENCOUNTER — Other Ambulatory Visit: Payer: Self-pay | Admitting: Internal Medicine

## 2023-02-26 DIAGNOSIS — E785 Hyperlipidemia, unspecified: Secondary | ICD-10-CM

## 2023-03-02 ENCOUNTER — Other Ambulatory Visit: Payer: Self-pay | Admitting: Internal Medicine

## 2023-04-05 ENCOUNTER — Other Ambulatory Visit: Payer: Self-pay

## 2023-04-05 DIAGNOSIS — R911 Solitary pulmonary nodule: Secondary | ICD-10-CM

## 2023-04-06 ENCOUNTER — Other Ambulatory Visit: Payer: Self-pay | Admitting: Internal Medicine

## 2023-04-06 DIAGNOSIS — E118 Type 2 diabetes mellitus with unspecified complications: Secondary | ICD-10-CM

## 2023-04-20 ENCOUNTER — Telehealth: Payer: Self-pay | Admitting: Acute Care

## 2023-04-20 NOTE — Telephone Encounter (Signed)
Pt needs CT rescheduled

## 2023-04-22 ENCOUNTER — Other Ambulatory Visit: Payer: Self-pay | Admitting: Internal Medicine

## 2023-04-23 ENCOUNTER — Ambulatory Visit (HOSPITAL_BASED_OUTPATIENT_CLINIC_OR_DEPARTMENT_OTHER): Admission: RE | Admit: 2023-04-23 | Payer: Medicare Other | Source: Ambulatory Visit

## 2023-04-26 ENCOUNTER — Telehealth: Payer: Self-pay | Admitting: Pulmonary Disease

## 2023-04-26 NOTE — Telephone Encounter (Signed)
[facility] checking on fax sent for oxygen. [facility] phone number is [phone].

## 2023-05-02 ENCOUNTER — Ambulatory Visit: Payer: Medicare Other | Admitting: Internal Medicine

## 2023-05-08 NOTE — Telephone Encounter (Signed)
I have checked Dr. Myrlene Broker C pod box and front desk. I have not seen anything on patient.    PCC's have any of you received a fax for O2?

## 2023-05-09 NOTE — Telephone Encounter (Signed)
I have not received anything from [employer] on this patient they need to refax to [phone]

## 2023-05-09 NOTE — Telephone Encounter (Signed)
Called [employer] and spoke with Boneta Lucks. She stated that they havce received all of the documentation needed for patient's oxygen recertification and nothing else is needed.   Will close encounter.

## 2023-05-16 ENCOUNTER — Ambulatory Visit: Payer: Medicare Other | Admitting: Acute Care

## 2023-05-18 ENCOUNTER — Ambulatory Visit: Payer: Medicare Other | Admitting: Acute Care

## 2023-06-28 ENCOUNTER — Ambulatory Visit (HOSPITAL_BASED_OUTPATIENT_CLINIC_OR_DEPARTMENT_OTHER)
Admission: RE | Admit: 2023-06-28 | Discharge: 2023-06-28 | Disposition: A | Discharge status: Home / Self Care | Payer: Medicare Other | Source: Ambulatory Visit | Attending: Surgery | Admitting: Surgery

## 2023-06-28 DIAGNOSIS — I7123 Aneurysm of the descending thoracic aorta, without rupture: Secondary | ICD-10-CM | POA: Diagnosis not present

## 2023-06-28 DIAGNOSIS — I714 Abdominal aortic aneurysm, without rupture, unspecified: Secondary | ICD-10-CM | POA: Diagnosis not present

## 2023-06-28 DIAGNOSIS — R918 Other nonspecific abnormal finding of lung field: Secondary | ICD-10-CM | POA: Diagnosis not present

## 2023-06-28 DIAGNOSIS — K7689 Other specified diseases of liver: Secondary | ICD-10-CM | POA: Diagnosis not present

## 2023-06-28 DIAGNOSIS — R911 Solitary pulmonary nodule: Secondary | ICD-10-CM | POA: Insufficient documentation

## 2023-06-28 DIAGNOSIS — J439 Emphysema, unspecified: Secondary | ICD-10-CM | POA: Diagnosis not present

## 2023-06-28 LAB — POCT I-STAT CREATININE: Creatinine, Ser: 1 mg/dL (ref 0.61–1.24)

## 2023-06-28 MED ORDER — IOHEXOL 350 MG/ML SOLN
100.0000 mL | Freq: Once | INTRAVENOUS | Status: AC | PRN
  Administered 2023-06-28: 75 mL via INTRAVENOUS

## 2023-07-03 ENCOUNTER — Other Ambulatory Visit: Payer: Self-pay | Admitting: Internal Medicine

## 2023-07-03 ENCOUNTER — Encounter: Payer: Self-pay | Admitting: Acute Care

## 2023-07-03 DIAGNOSIS — E118 Type 2 diabetes mellitus with unspecified complications: Secondary | ICD-10-CM

## 2023-07-03 NOTE — Progress Notes (Signed)
[name], you are seeing him on the 20th. Would discuss considerations for PET imaging vs bronchoscopy if he can handle it. The nodule from 2023 looks like it has disappeared... this looks like enlargement of the nodule that was found in feb 2024  Thanks,  BLI  [name], DO [facility] 07/03/2023 5:00 PM

## 2023-07-04 ENCOUNTER — Telehealth: Payer: Self-pay | Admitting: Acute Care

## 2023-07-04 NOTE — Telephone Encounter (Signed)
I have called the patient with the results of his CT angio chest aorta with contrast and without.  I explained to him that his scan showed that his abdominal aortic aneurysm is stable.  However there was a notation that the previously biopsied somewhat spiculated 2.1 cm area of nodular consolidation within the right upper lobe has increased in size compared to the December 14, 2022 scan.  At that time it was 1.1 cm.  Recommendation is for repeat PET/CT. I discussed this with the patient and he says he does not want to have a PET done.  I reiterated that we recommend PET scan or we could do a short-term follow-up to see if this mass continues to grow.  He has not been sick. As patient is not ready to make a decision regarding getting a PET scan done plan will be to have him follow-up in the office in 2 weeks once he has thought about what he would like to do.  We will cancel the appointment for this Friday, September 20 as I have reviewed the results of the scan with the patient.  There was notation of a diffuse decreased attenuation of the hepatic parenchyma with mild nodularity of the hepatic contour that is also noted sometimes an early cirrhotic changes.  Patient states he has never had issues with his liver.  Recommendation is for evaluation of LFTs. Dr. Yetta Barre I have included you on this telephone note I know that you see the patient in October could you possibly do LFTs at that time to evaluate him for early stage cirrhosis. Please reach out if you have any questions or concerns  Sherre Lain, and Robynn Pane, please cancel the patient's September 20 appointment. Please schedule patient for 2 weeks with either myself, or Dr. Tonia Brooms, whoever has earliest availability.  At that time the patient will determine if he wants to do a PET scan which is recommended versus a short-term follow-up CT imaging.

## 2023-07-05 NOTE — Telephone Encounter (Signed)
Left Vm for patient to call to schedule follow up OV with Eric Robinsons, NP or [name] in 2-3 weeks to further discuss CT results and possible next steps for care.

## 2023-07-06 ENCOUNTER — Ambulatory Visit: Payer: Medicare Other | Admitting: Acute Care

## 2023-07-11 NOTE — Telephone Encounter (Signed)
Letter mailed to home to call for follow up appointment for next steps in care.

## 2023-07-12 ENCOUNTER — Other Ambulatory Visit: Payer: Self-pay | Admitting: Internal Medicine

## 2023-07-12 DIAGNOSIS — E118 Type 2 diabetes mellitus with unspecified complications: Secondary | ICD-10-CM

## 2023-07-21 ENCOUNTER — Other Ambulatory Visit: Payer: Self-pay | Admitting: Internal Medicine

## 2023-07-21 DIAGNOSIS — E118 Type 2 diabetes mellitus with unspecified complications: Secondary | ICD-10-CM

## 2023-07-21 DIAGNOSIS — I10 Essential (primary) hypertension: Secondary | ICD-10-CM

## 2023-07-24 ENCOUNTER — Ambulatory Visit: Payer: Medicare Other

## 2023-07-24 VITALS — Ht 69.0 in | Wt 209.0 lb

## 2023-07-24 DIAGNOSIS — Z0000 Encounter for general adult medical examination without abnormal findings: Secondary | ICD-10-CM

## 2023-07-24 NOTE — Progress Notes (Signed)
Subjective:   [name] is a 67 y.o. male who presents for Medicare Annual/Subsequent preventive examination.  Visit Complete: Virtual I connected with  Lamonte Sakai on 07/24/23 by a audio enabled telemedicine application and verified that I am speaking with the correct person using two identifiers.  Patient Location: Home  Provider Location: Home Office  I discussed the limitations of evaluation and management by telemedicine. The patient expressed understanding and agreed to proceed.  Vital Signs: Because this visit was a virtual/telehealth visit, some criteria may be missing or patient reported. Any vitals not documented were not able to be obtained and vitals that have been documented are patient reported.  Because this visit was a virtual/telehealth visit, some criteria may be missing or patient reported. Any vitals not documented were not able to be obtained and vitals that have been documented are patient reported.    Cardiac Risk Factors include: advanced age (>69men, >43 women);diabetes mellitus;hypertension;dyslipidemia;male gender     Objective:    Today's Vitals   07/24/23 1518  Weight: 209 lb (94.8 kg)  Height: 5\' 9"  (1.753 m)   Body mass index is 30.86 kg/m.     07/24/2023    3:25 PM 08/11/2022    8:56 AM 06/13/2022    7:25 AM 10/27/2021    5:43 PM 07/28/2021   12:29 PM 11/27/2020   12:04 AM 04/22/2020    1:12 PM  Advanced Directives  Does Patient Have a Medical Advance Directive? No No No No No No No  Would patient like information on creating a medical advance directive?  No - Patient declined No - Patient declined No - Patient declined No - Patient declined No - Patient declined No - Patient declined    Current Medications (verified) Outpatient Encounter Medications as of 07/24/2023  Medication Sig   aspirin 81 MG EC tablet Take 81 mg by mouth at bedtime.   Blood Glucose Monitoring Suppl (ONETOUCH VERIO) w/Device KIT 1 Act by Does not apply route in  the morning and at bedtime.   Carboxymethylcellul-Glycerin (LUBRICATING EYE DROPS OP) Place 1 drop into both eyes daily as needed (dry eyes).   [employer] D3 50 MCG (2000 UT) CAPS TAKE 2 CAPSULES (4,000 UNITS TOTAL) BY MOUTH DAILY.   ezetimibe (ZETIA) 10 MG tablet TAKE 1 TABLET BY MOUTH EVERY DAY   famotidine (PEPCID) 40 MG tablet TAKE 1 TABLET BY MOUTH EVERY DAY   fexofenadine (ALLEGRA) 180 MG tablet Take 180 mg by mouth daily.   glucose blood (ONETOUCH VERIO) test strip 1 each by Other route in the morning and at bedtime. Use as instructed   ibuprofen (ADVIL) 200 MG tablet Take 400 mg by mouth every 6 (six) hours as needed for moderate pain.   KLOR-CON M20 20 MEQ tablet TAKE 1 TABLET BY MOUTH TWICE A DAY   KRILL OIL PO Take 400 mg by mouth daily.   Lancets (ONETOUCH ULTRASOFT) lancets    metFORMIN (GLUCOPHAGE) 1000 MG tablet TAKE 1 TABLET (1,000 MG TOTAL) BY MOUTH TWICE A DAY WITH FOOD   NON FORMULARY Oxygen continuous 1.5 liters with activity   olmesartan (BENICAR) 20 MG tablet TAKE 1 TABLET BY MOUTH EVERY DAY   ONETOUCH ULTRA test strip USE TO TEST UP TO 4 TIMES DAILY AS DIRECTED   ONETOUCH VERIO test strip USE 1 PER DAY   Semaglutide (RYBELSUS) 3 MG TABS Take 3 mg by mouth daily.   sodium chloride (OCEAN) 0.65 % SOLN nasal spray Place 1 spray into  both nostrils as needed for congestion.   Tetrahydrozoline HCl (VISINE OP) Place 1 drop into both eyes daily as needed (allergies).   Tiotropium Bromide-Olodaterol (STIOLTO RESPIMAT) 2.5-2.5 MCG/ACT AERS Inhale 2 puffs into the lungs daily. (Patient taking differently: Inhale 2 puffs into the lungs daily as needed (shortness of breath).)   trolamine salicylate (ASPERCREME) 10 % cream Apply 1 Application topically as needed for muscle pain.   No facility-administered encounter medications on file as of 07/24/2023.    Allergies (verified) Morphine, Statins, and Latex   History: Past Medical History:  Diagnosis Date   Aortic aneurysm (HCC)    COPD  (chronic obstructive pulmonary disease) (HCC) 09/23/2008   -PFT 09/23/08 FEV1 2.89(84%), FVC 3.70 (78%), TLC 5.28 (78%), DLCO 64% mixed obstructive and  restrictive pattern.  Spirometry 03/11/09 FEV 3.35 (97%),FVC 4.10 (86%), FEV% 82. Steroid responsive pneumonitis 04/09- required mechanical ventilation during hospitalization - The following hospital test were negative: PCP,ANA,HIV,BAL. - ACE25,RF20,CRP 28,A1AT 263, ESR 109   Depression    Diabetes mellitus without complication (HCC)    Dyslipidemia    Dyslipidemia    Emphysema/COPD (HCC)    GERD (gastroesophageal reflux disease)    History of skin cancer    Hypertension    Echo 10/09 EF 60%   ILD (interstitial lung disease) (HCC)    Panic attack    Pneumonia    2009 "put on a vent"   Past Surgical History:  Procedure Laterality Date   BRONCHIAL WASHINGS  06/13/2022   Procedure: BRONCHIAL WASHINGS;  Surgeon: [name], DO;  Location: [facility];  Service: Pulmonary;;   BRONCHOSCOPY  10/09   Ganglion cyst on foot     HERNIA REPAIR     2 hernia   Left shoulder surgery     Family History  Problem Relation Age of Onset   Heart attack Father 59   Diabetes Father    Heart attack Brother    Emphysema Brother    Diabetes Brother    Social History   Socioeconomic History   Marital status: Divorced    Spouse name: Not on file   Number of children: 0   Years of education: 10th grade returned for GED   Highest education level: GED or equivalent  Occupational History   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    Social Determinants of Health   Financial Resource Strain: Low Risk  (  07/24/2023)   Overall Financial Resource Strain (CARDIA)    Difficulty of Paying Living Expenses: Not hard at all  Food Insecurity: No Food Insecurity (07/24/2023)   Hunger Vital Sign    Worried About Running Out of Food in the Last Year: Never true    Ran Out of Food in the Last Year: Never true  Transportation Needs: No Transportation Needs (07/24/2023)   PRAPARE - Administrator, Civil Service (Medical): No    Lack of Transportation (Non-Medical): No  Physical Activity: Insufficiently Active (07/24/2023)   Exercise Vital Sign    Days of Exercise per Week: 2 days    Minutes of Exercise per Session: 20 min  Stress: No Stress Concern Present (07/24/2023)   [employer] of Occupational Health - Occupational Stress Questionnaire    Feeling of Stress : Not at all  Social Connections: Socially Isolated (07/24/2023)   Social Connection and Isolation Panel [NHANES]    Frequency of Communication with Friends and Family: Three times a week    Frequency of Social Gatherings with Friends and Family: Once a week    Attends Religious Services: Never    Database administrator or Organizations: No    Attends Engineer, structural: Never    Marital Status: Divorced    Tobacco Counseling Counseling given: Not Answered   Clinical Intake:  Pre-visit preparation completed: Yes  Pain : No/denies pain     BMI - recorded: 30.86 Nutritional Status:  BMI > 30  Obese Nutritional Risks: None Diabetes: Yes CBG done?: Yes (per pt 167) CBG resulted in Enter/ Edit results?: No Did pt. bring in CBG monitor from home?: No  How often do you need to have someone help you when you read instructions, pamphlets, or other written materials from your doctor or pharmacy?: 1 - Never  Interpreter Needed?: No  Information entered by :: Amori Colomb, RMA   Activities of Daily Living    07/24/2023    3:21 PM 08/11/2022    8:55 AM  In your present state of health, do you have any difficulty performing the following activities:  Hearing? 0 0  Vision? 0 0  Difficulty concentrating or making decisions? 0 0  Walking or climbing stairs? 0 0  Dressing or bathing? 0 0  Doing errands, shopping? 0 0  Preparing Food and eating ? N N  Using the Toilet? N N  In the past six months, have you accidently leaked urine? N N  Do you have problems with loss of bowel control? N N  Managing your Medications? N N  Managing your Finances? N N  Housekeeping or managing your Housekeeping? N N    Patient Care Team: Etta Grandchild, MD as PCP - General (Internal Medicine) Coralyn Helling, MD (Inactive) as Consulting Physician (Pulmonary Disease) End, Cristal Deer, MD as Consulting Physician (Cardiology) Szabat, Vinnie Level, [facility] (Inactive) (Pharmacist)  Indicate any recent Medical Services you may have received from other than [facility] providers in the past year (date may be approximate).     Assessment:   This is a routine wellness examination for Davinder.  Hearing/Vision screen Hearing Screening - Comments:: Denies hearing difficulties   Vision Screening - Comments:: Wears eyeglasses.   Goals Addressed               This Visit's Progress     Patient Stated (pt-stated)        Live as long as he could to get and about.  Depression Screen    07/24/2023    3:33 PM 08/11/2022    8:54 AM 11/16/2021    1:15 PM 08/23/2021    6:13 PM 07/28/2021   12:31 PM  04/25/2021   10:46 AM 01/28/2021   10:20 AM  PHQ 2/9 Scores  PHQ - 2 Score 0 0 0 1 0 1 0  PHQ- 9 Score 0          Fall Risk    07/24/2023    3:26 PM 08/11/2022    8:52 AM 04/28/2022    9:40 AM 02/14/2022    9:45 AM 12/28/2021   11:30 AM  Fall Risk   Falls in the past year? 0 0 0 0 --  Comment   continues to deny falls x last 12 months; does not require/ use assistive devices continues to deny falls- does not use assistive devices Denies new/ recent falls since last outreach 11/30/21; continues to deny use of assistive devices  Number falls in past yr: 0 0 0 0   Injury with Fall? 0 0 0 0   Comment    N/A- denies falls x 12 months   Risk for fall due to : No Fall Risks No Fall Risks No Fall Risks Medication side effect   Follow up Falls prevention discussed;Falls evaluation completed Falls prevention discussed Falls prevention discussed Falls prevention discussed Falls prevention discussed    MEDICARE RISK AT HOME: Medicare Risk at Home Any stairs in or around the home?: No Home free of loose throw rugs in walkways, pet beds, electrical cords, etc?: Yes (for the most part-per pt.) Adequate lighting in your home to reduce risk of falls?: Yes Life alert?: No Use of a cane, walker or w/c?: No Grab bars in the bathroom?: No Shower chair or bench in shower?: Yes Elevated toilet seat or a handicapped toilet?: Yes  TIMED UP AND GO:  Was the test performed?  No    Cognitive Function:        07/24/2023    3:28 PM 08/11/2022    8:55 AM 04/22/2020    1:13 PM  6CIT Screen  What Year? 0 points 0 points 0 points  What month? 0 points 0 points 0 points  What time? 0 points 0 points 0 points  Count back from 20 0 points 0 points 0 points  Months in reverse 0 points 0 points 0 points  Repeat phrase 0 points 0 points 0 points  Total Score 0 points 0 points 0 points    Immunizations Immunization History  Administered Date(s) Administered   Fluad Quad(high Dose 65+) 06/24/2019, 07/28/2021    Influenza Split 07/05/2011, 07/16/2012   Influenza Whole 07/21/2009, 08/22/2010   Influenza, High Dose Seasonal PF 07/17/2022   Influenza,inj,Quad PF,6+ Mos 06/25/2013, 07/13/2014, 07/15/2015, 08/08/2016, 06/11/2017, 06/12/2018   Influenza-Unspecified 07/16/2020   Moderna Sars-Covid-2 Vaccination 12/16/2019, 01/11/2020, 03/27/2021   Pneumococcal Conjugate-13 06/25/2013   Pneumococcal Polysaccharide-23 08/17/2008, 08/08/2016, 04/26/2022   Respiratory Syncytial Virus Vaccine,Recomb Aduvanted(Arexvy) 08/02/2022   Td 10/16/2005   Tdap 01/12/2016   Zoster Recombinant(Shingrix) 04/28/2021, 07/05/2021   Zoster, Live 05/08/2016    TDAP status: Up to date  Flu Vaccine status: Up to date  Pneumococcal vaccine status: Up to date  Covid-19 vaccine status: Completed vaccines  Qualifies for Shingles Vaccine? Yes   Zostavax completed Yes   Shingrix Completed?: Yes  Screening Tests Health Maintenance  Topic Date Due   HEMOGLOBIN A1C  05/02/2023   COVID-19 Vaccine (4 - 2023-24 season) 06/17/2023  OPHTHALMOLOGY EXAM  07/26/2023   Diabetic kidney evaluation - eGFR measurement  11/02/2023   Diabetic kidney evaluation - Urine ACR  11/02/2023   FOOT EXAM  11/02/2023   Medicare Annual Wellness (AWV)  07/23/2024   Fecal DNA (Cologuard)  11/02/2024   DTaP/Tdap/Td (3 - Td or Tdap) 01/11/2026   Pneumonia Vaccine 41+ Years old  Completed   INFLUENZA VACCINE  Completed   Hepatitis C Screening  Completed   Zoster Vaccines- Shingrix  Completed   HPV VACCINES  Aged Out   Colonoscopy  Discontinued    Health Maintenance  Health Maintenance Due  Topic Date Due   HEMOGLOBIN A1C  05/02/2023   COVID-19 Vaccine (4 - 2023-24 season) 06/17/2023    Colorectal cancer screening: Type of screening: Cologuard. Completed 11/02/2021. Repeat every 3 years  Lung Cancer Screening: (Low Dose CT Chest recommended if Age 34-80 years, 20 pack-year currently smoking OR have quit w/in 15years.) does qualify.    Lung Cancer Screening Referral: 12/29/2022  Additional Screening:  Hepatitis C Screening: does qualify; Completed 07/13/2014  Vision Screening: Recommended annual ophthalmology exams for early detection of glaucoma and other disorders of the eye. Is the patient up to date with their annual eye exam?  No  Who is the provider or what is the name of the office in which the patient attends annual eye exams? Dr. Gwinda Passe If pt is not established with a provider, would they like to be referred to a provider to establish care? No .   Dental Screening: Recommended annual dental exams for proper oral hygiene  Diabetic Foot Exam: Diabetic Foot Exam: Completed 11/01/2022  Community Resource Referral / Chronic Care Management: CRR required this visit?  No   CCM required this visit?  No     Plan:     I have personally reviewed and noted the following in the patient's chart:   Medical and social history Use of alcohol, tobacco or illicit drugs  Current medications and supplements including opioid prescriptions. Patient is not currently taking opioid prescriptions. Functional ability and status Nutritional status Physical activity Advanced directives List of other physicians Hospitalizations, surgeries, and ER visits in previous 12 months Vitals Screenings to include cognitive, depression, and falls Referrals and appointments  In addition, I have reviewed and discussed with patient certain preventive protocols, quality metrics, and best practice recommendations. A written personalized care plan for preventive services as well as general preventive health recommendations were provided to patient.     Donda Friedli L Breelynn Bankert, CMA   07/24/2023   After Visit Summary: (Mail) Due to this being a telephonic visit, the after visit summary with patients personalized plan was offered to patient via mail   Nurse Notes: Patient is due for a yearly eye exam.  He stated that he would be going soon.  He  had no other concerns to address today.

## 2023-07-24 NOTE — Patient Instructions (Signed)
[name] , Thank you for taking time to come for your Medicare Wellness Visit. I appreciate your ongoing commitment to your health goals. Please review the following plan we discussed and let me know if I can assist you in the future.   Referrals/Orders/Follow-Ups/Clinician Recommendations: Keep up the good work.  Each day, aim for 6 glasses of water, plenty of protein in your diet and try to get up and walk/ stretch every hour for 5-10 minutes at a time.    This is a list of the screening recommended for you and due dates:  Health Maintenance  Topic Date Due   Hemoglobin A1C  05/02/2023   COVID-19 Vaccine (4 - 2023-24 season) 06/17/2023   Eye exam for diabetics  07/26/2023   Yearly kidney function blood test for diabetes  11/02/2023   Yearly kidney health urinalysis for diabetes  11/02/2023   Complete foot exam   11/02/2023   Medicare Annual Wellness Visit  07/23/2024   Cologuard (Stool DNA test)  11/02/2024   DTaP/Tdap/Td vaccine (3 - Td or Tdap) 01/11/2026   Pneumonia Vaccine  Completed   Flu Shot  Completed   Hepatitis C Screening  Completed   Zoster (Shingles) Vaccine  Completed   HPV Vaccine  Aged Out   Colon Cancer Screening  Discontinued    Advanced directives: (Copy Requested) Please bring a copy of your health care power of attorney and living will to the office to be added to your chart at your convenience.  Next Medicare Annual Wellness Visit scheduled for next year: Yes

## 2023-08-01 LAB — HM DIABETES EYE EXAM

## 2023-08-03 ENCOUNTER — Encounter: Payer: Self-pay | Admitting: Pharmacist

## 2023-08-03 NOTE — Progress Notes (Signed)
Pharmacy Quality Measure Review  This patient is appearing on report for being at risk of failing the measure for Statin Use in Persons with Diabetes (SUPD) medications this calendar year.   Prior trials of: rosuvastatin   G72.0 previously used, not yet this year. Added note on 08/13/23 appt for PCP to add G72.0 statin exclusion code if applicable.  Arbutus Leas, PharmD, BCPS [facility] [phone]

## 2023-08-13 ENCOUNTER — Encounter: Payer: Self-pay | Admitting: Internal Medicine

## 2023-08-13 ENCOUNTER — Ambulatory Visit (INDEPENDENT_AMBULATORY_CARE_PROVIDER_SITE_OTHER): Payer: Medicare Other | Admitting: Internal Medicine

## 2023-08-13 VITALS — BP 130/88 | HR 99 | Temp 97.8°F | Resp 16 | Ht 69.0 in | Wt 210.8 lb

## 2023-08-13 DIAGNOSIS — J9611 Chronic respiratory failure with hypoxia: Secondary | ICD-10-CM

## 2023-08-13 DIAGNOSIS — Z7984 Long term (current) use of oral hypoglycemic drugs: Secondary | ICD-10-CM

## 2023-08-13 DIAGNOSIS — I10 Essential (primary) hypertension: Secondary | ICD-10-CM | POA: Diagnosis not present

## 2023-08-13 DIAGNOSIS — E118 Type 2 diabetes mellitus with unspecified complications: Secondary | ICD-10-CM | POA: Diagnosis not present

## 2023-08-13 DIAGNOSIS — R0609 Other forms of dyspnea: Secondary | ICD-10-CM | POA: Insufficient documentation

## 2023-08-13 DIAGNOSIS — G720 Drug-induced myopathy: Secondary | ICD-10-CM | POA: Insufficient documentation

## 2023-08-13 DIAGNOSIS — T466X5A Adverse effect of antihyperlipidemic and antiarteriosclerotic drugs, initial encounter: Secondary | ICD-10-CM | POA: Diagnosis not present

## 2023-08-13 LAB — CBC WITH DIFFERENTIAL/PLATELET
Basophils Absolute: 0.1 10*3/uL (ref 0.0–0.1)
Basophils Relative: 1.4 % (ref 0.0–3.0)
Eosinophils Absolute: 0.3 10*3/uL (ref 0.0–0.7)
Eosinophils Relative: 2.7 % (ref 0.0–5.0)
HCT: 44.3 % (ref 39.0–52.0)
Hemoglobin: 14.6 g/dL (ref 13.0–17.0)
Lymphocytes Relative: 21.8 % (ref 12.0–46.0)
Lymphs Abs: 2.2 10*3/uL (ref 0.7–4.0)
MCHC: 33 g/dL (ref 30.0–36.0)
MCV: 91.1 fL (ref 78.0–100.0)
Monocytes Absolute: 0.9 10*3/uL (ref 0.1–1.0)
Monocytes Relative: 8.5 % (ref 3.0–12.0)
Neutro Abs: 6.7 10*3/uL (ref 1.4–7.7)
Neutrophils Relative %: 65.6 % (ref 43.0–77.0)
Platelets: 296 10*3/uL (ref 150.0–400.0)
RBC: 4.86 Mil/uL (ref 4.22–5.81)
RDW: 14 % (ref 11.5–15.5)
WBC: 10.3 10*3/uL (ref 4.0–10.5)

## 2023-08-13 LAB — BASIC METABOLIC PANEL
BUN: 14 mg/dL (ref 6–23)
CO2: 21 meq/L (ref 19–32)
Calcium: 9.7 mg/dL (ref 8.4–10.5)
Chloride: 103 meq/L (ref 96–112)
Creatinine, Ser: 0.94 mg/dL (ref 0.40–1.50)
GFR: 82.64 mL/min (ref >60.00)
Glucose, Bld: 129 mg/dL — ABNORMAL HIGH (ref 70–99)
Potassium: 4.3 meq/L (ref 3.5–5.1)
Sodium: 134 meq/L — ABNORMAL LOW (ref 135–145)

## 2023-08-13 LAB — HEMOGLOBIN A1C: Hgb A1c MFr Bld: 7.5 % — ABNORMAL HIGH (ref 4.6–6.5)

## 2023-08-13 LAB — HEPATIC FUNCTION PANEL
ALT: 38 U/L (ref 0–53)
AST: 22 U/L (ref 0–37)
Albumin: 4.7 g/dL (ref 3.5–5.2)
Alkaline Phosphatase: 84 U/L (ref 39–117)
Bilirubin, Direct: 0.1 mg/dL (ref 0.0–0.3)
Total Bilirubin: 0.4 mg/dL (ref 0.2–1.2)
Total Protein: 7.2 g/dL (ref 6.0–8.3)

## 2023-08-13 LAB — BRAIN NATRIURETIC PEPTIDE: Pro B Natriuretic peptide (BNP): 19 pg/mL (ref 0.0–100.0)

## 2023-08-13 LAB — TROPONIN I (HIGH SENSITIVITY): High Sens Troponin I: 3 ng/L (ref 2–17)

## 2023-08-13 MED ORDER — RYBELSUS 3 MG PO TABS: 3.0000 mg | ORAL_TABLET | Freq: Every day | ORAL | 0 refills | Status: DC

## 2023-08-13 MED ORDER — OLMESARTAN MEDOXOMIL 20 MG PO TABS: 20.0000 mg | ORAL_TABLET | Freq: Every day | ORAL | 0 refills | Status: DC

## 2023-08-13 MED ORDER — METFORMIN HCL ER 750 MG PO TB24: 750.0000 mg | ORAL_TABLET | Freq: Every day | ORAL | 0 refills | Status: DC

## 2023-08-13 NOTE — Patient Instructions (Signed)

## 2023-08-13 NOTE — Progress Notes (Unsigned)
Subjective:  Patient ID: [name], male    DOB: 1956-03-12  Age: 67 y.o. MRN: [id number]  CC: Hypertension, Hyperlipidemia, and Diabetes   HPI ZAYDRIAN GLOE presents for f/up --  Discussed the use of AI scribe software for clinical note transcription with the patient, who gave verbal consent to proceed.  History of Present Illness   The patient presents for follow-up after a recent CT scan revealed a new lung nodule. He reports no associated symptoms such as chest pain or shortness of breath. The patient declined a PET scan due to a previous experience where a nodule disappeared before biopsy. He reports no significant exertional symptoms unless he 'overdoes it,' and has adapted his activities accordingly, such as switching to a riding lawnmower. He experiences shortness of breath only when walking uphill, but otherwise tolerates regular walking without difficulty.  Regarding his hypertension, the patient reports improved tolerance since the reduction of his blood pressure medication from 40mg  to 20mg , as the higher dose was causing fatigue and low blood pressure readings at home. He denies any symptoms of lightheadedness or dizziness.       Outpatient Medications Prior to Visit  Medication Sig Dispense Refill   aspirin 81 MG EC tablet Take 81 mg by mouth at bedtime.     Blood Glucose Monitoring Suppl (ONETOUCH VERIO) w/Device KIT 1 Act by Does not apply route in the morning and at bedtime. 1 kit 2   Carboxymethylcellul-Glycerin (LUBRICATING EYE DROPS OP) Place 1 drop into both eyes daily as needed (dry eyes).     [employer] D3 50 MCG (2000 UT) CAPS TAKE 2 CAPSULES (4,000 UNITS TOTAL) BY MOUTH DAILY. 180 capsule 1   ezetimibe (ZETIA) 10 MG tablet TAKE 1 TABLET BY MOUTH EVERY DAY 90 tablet 1   fexofenadine (ALLEGRA) 180 MG tablet Take 180 mg by mouth daily.     glucose blood (ONETOUCH VERIO) test strip 1 each by Other route in the morning and at bedtime. Use as instructed 200 each 3    Lancets (ONETOUCH ULTRASOFT) lancets      NON FORMULARY Oxygen continuous 1.5 liters with activity     ONETOUCH ULTRA test strip USE TO TEST UP TO 4 TIMES DAILY AS DIRECTED 100 strip 5   ONETOUCH VERIO test strip USE 1 PER DAY 100 each 2   Tetrahydrozoline HCl (VISINE OP) Place 1 drop into both eyes daily as needed (allergies).     Tiotropium Bromide-Olodaterol (STIOLTO RESPIMAT) 2.5-2.5 MCG/ACT AERS Inhale 2 puffs into the lungs daily. (Patient taking differently: Inhale 2 puffs into the lungs daily as needed (shortness of breath).) 12 g 1   trolamine salicylate (ASPERCREME) 10 % cream Apply 1 Application topically as needed for muscle pain.     metFORMIN (GLUCOPHAGE) 1000 MG tablet TAKE 1 TABLET (1,000 MG TOTAL) BY MOUTH TWICE A DAY WITH FOOD 180 tablet 0   olmesartan (BENICAR) 20 MG tablet TAKE 1 TABLET BY MOUTH EVERY DAY 90 tablet 0   famotidine (PEPCID) 40 MG tablet TAKE 1 TABLET BY MOUTH EVERY DAY (Patient not taking: Reported on 08/13/2023) 90 tablet 1   ibuprofen (ADVIL) 200 MG tablet Take 400 mg by mouth every 6 (six) hours as needed for moderate pain. (Patient not taking: Reported on 08/13/2023)     KRILL OIL PO Take 400 mg by mouth daily. (Patient not taking: Reported on 08/13/2023)     sodium chloride (OCEAN) 0.65 % SOLN nasal spray Place 1 spray into both nostrils as  needed for congestion. (Patient not taking: Reported on 08/13/2023)     KLOR-CON M20 20 MEQ tablet TAKE 1 TABLET BY MOUTH TWICE A DAY (Patient not taking: Reported on 08/13/2023) 180 tablet 0   Semaglutide (RYBELSUS) 3 MG TABS Take 3 mg by mouth daily. 30 tablet 0   No facility-administered medications prior to visit.    ROS Review of Systems  Constitutional:  Negative for appetite change, chills, diaphoresis and fatigue.  HENT: Negative.    Eyes:  Negative for visual disturbance.  Respiratory:  Positive for shortness of breath (DOE). Negative for cough, chest tightness and wheezing.   Cardiovascular:  Negative for  chest pain, palpitations and leg swelling.  Gastrointestinal:  Negative for abdominal pain, constipation, diarrhea, nausea and vomiting.  Musculoskeletal: Negative.   Skin: Negative.   Neurological: Negative.  Negative for dizziness and weakness.  Hematological:  Negative for adenopathy. Does not bruise/bleed easily.  Psychiatric/Behavioral: Negative.      Objective:  BP 130/88 (BP Location: Left Arm, Patient Position: Sitting, Cuff Size: Normal)   Pulse 99   Temp 97.8 F (36.6 C) (Oral)   Resp 16   Ht 5\' 9"  (1.753 m)   Wt 210 lb 12.8 oz (95.6 kg)   SpO2 93%   BMI 31.13 kg/m   BP Readings from Last 3 Encounters:  08/13/23 130/88  12/29/22 100/80  11/01/22 126/78    Wt Readings from Last 3 Encounters:  08/13/23 210 lb 12.8 oz (95.6 kg)  07/24/23 209 lb (94.8 kg)  12/29/22 217 lb 3.2 oz (98.5 kg)    Physical Exam Vitals reviewed.  Constitutional:      General: He is not in acute distress.    Appearance: Normal appearance. He is not ill-appearing, toxic-appearing or diaphoretic.  HENT:     Mouth/Throat:     Mouth: Mucous membranes are moist.  Eyes:     General: No scleral icterus.    Conjunctiva/sclera: Conjunctivae normal.  Cardiovascular:     Rate and Rhythm: Normal rate and regular rhythm.     Heart sounds: No murmur heard.    Comments: EKG- NSR, 93 bpm No LVH, Q waves, or ST/T waves  Pulmonary:     Effort: Pulmonary effort is normal.     Breath sounds: No stridor. No wheezing, rhonchi or rales.  Abdominal:     General: Abdomen is flat.     Palpations: There is no mass.     Tenderness: There is no abdominal tenderness. There is no guarding.     Hernia: No hernia is present.  Musculoskeletal:        General: Normal range of motion.     Cervical back: Neck supple.     Right lower leg: No edema.     Left lower leg: No edema.  Lymphadenopathy:     Cervical: No cervical adenopathy.  Skin:    General: Skin is dry.  Neurological:     General: No focal  deficit present.     Mental Status: He is alert. Mental status is at baseline.  Psychiatric:        Mood and Affect: Mood normal.        Behavior: Behavior normal.     Lab Results  Component Value Date   WBC 10.3 08/13/2023   HGB 14.6 08/13/2023   HCT 44.3 08/13/2023   PLT 296.0 08/13/2023   GLUCOSE 129 (H) 08/13/2023   CHOL 182 11/01/2022   TRIG 194.0 (H) 11/01/2022   HDL 49.70 11/01/2022  LDLDIRECT 117.0 04/26/2022   LDLCALC 93 11/01/2022   ALT 38 08/13/2023   AST 22 08/13/2023   NA 134 (L) 08/13/2023   K 4.3 08/13/2023   CL 103 08/13/2023   CREATININE 0.94 08/13/2023   BUN 14 08/13/2023   CO2 21 08/13/2023   TSH 2.85 11/01/2022   PSA 0.58 10/26/2021   INR 1.2 08/04/2008   HGBA1C 7.5 (H) 08/13/2023   MICROALBUR <0.7 11/01/2022    CT ANGIO CHEST AORTA W/CM & OR WO/CM  Result Date: 06/30/2023 CLINICAL DATA:  Follow-up abdominal aortic aneurysm. Former smoker. History of diabetes and COPD. History of prior lung nodule biopsy. EXAM: CT ANGIOGRAPHY CHEST WITH CONTRAST TECHNIQUE: Multidetector CT imaging of the chest was performed using the standard protocol during bolus administration of intravenous contrast. Multiplanar CT image reconstructions and MIPs were obtained to evaluate the vascular anatomy. RADIATION DOSE REDUCTION: This exam was performed according to the departmental dose-optimization program which includes automated exposure control, adjustment of the mA and/or kV according to patient size and/or use of iterative reconstruction technique. CONTRAST:  75mL OMNIPAQUE IOHEXOL 350 MG/ML SOLN COMPARISON:  Chest CT-12/14/2022; 05/03/2022; 12/19/2016; PET-CT-06/07/2022 FINDINGS: Vascular Findings: Stable fusiform ectasia dilatation of the ascending thoracic aorta with measurements as follows. There is a moderate-to-large amount of slightly irregular mixed calcified and noncalcified atherosclerotic plaque involving the aortic arch and descending thoracic aorta, not resulting  in a hemodynamically significant stenosis. No evidence of abdominal aortic dissection or perivascular stranding on this nongated examination. Conventional configuration of the aortic arch. The branch vessels of the aortic arch are diseased at their origin though without hemodynamically significant narrowing throughout their imaged courses. The descending thoracic aorta is diseased though without hemodynamically significant narrowing and of normal caliber. Normal heart size. Suspected coronary artery calcifications. No pericardial effusion. Although this examination was not tailored for the evaluation the pulmonary arteries, there are no discrete filling defects within the central pulmonary arterial tree to suggest central pulmonary embolism. Normal caliber of the main pulmonary artery. ------------------------------------------------------------- Thoracic aortic measurements: SINOTUBULAR JUNCTION: 36 mm as measured in greatest oblique short axis coronal dimension. PROXIMAL ASCENDING THORACIC AORTA: 39 mm as measured in greatest oblique short axis axial dimension (axial image 69, series 4) at the level of the main pulmonary artery and approximately 39 mm as measured in greatest oblique short axis coronal dimension (coronal image 99, series 7), unchanged compared to the 12/2016 examination by my direct remeasurement. AORTIC ARCH: 32 mm as measured in greatest oblique short axis sagittal dimension. PROXIMAL DESCENDING THORACIC AORTA: 30 mm as measured in greatest oblique short axis axial dimension at the level of the main pulmonary artery. DISTAL DESCENDING THORACIC AORTA: 20 mm as measured in greatest oblique short axis axial dimension at the level of the diaphragmatic hiatus. Review of the MIP images confirms the above findings. ------------------------------------------------------------- Non-Vascular Findings: Mediastinum/Lymph Nodes: Redemonstrated prominent though non pathologically enlarged mediastinal and  bilateral hilar lymph nodes, with index left hilar lymph node measuring 1.3 cm (image 71, series 4), index right hilar lymph node measuring 0.9 cm (image 74, series 4) and index subcarinal lymph node measuring 0.9 cm (image 70, series 4), similar to chest CT performed 05/03/2022, presumably reactive in etiology in the setting of advanced emphysematous change. No bulky mediastinal, hilar or axillary lymphadenopathy. Lungs/Pleura: Redemonstrated advanced apical predominant centrilobular and paraseptal emphysematous change. Previously biopsied somewhat spiculated 2.1 x 1.6 cm area of nodular consolidation within the right upper lobe (image 48, series 6) has increased in size compared to  the 12/14/2022 examination, previously, 1.1 cm as well as PET-CT performed 06/07/2022, previously, 1.2 cm. No discrete focal airspace opacities. No pleural effusion or pneumothorax. The central pulmonary airways appear widely patent. Upper abdomen: Limited early arterial phase evaluation of the upper abdomen demonstrates diffuse decreased attenuation of the hepatic parenchyma with mild nodularity of the hepatic contour. No discrete hyperenhancing hepatic lesions. Moderate colonic stool burden without evidence of enteric obstruction. Musculoskeletal: No acute or aggressive osseous abnormalities. Moderate DDD of C6-C7 with disc space height loss, endplate irregularity and sclerosis. Regional soft tissues appear normal. Normal appearance of the thyroid gland. IMPRESSION: 1. Stable uncomplicated fusiform ectasia of the ascending thoracic aorta measuring up to 39 mm, unchanged compared to the 12/2016 examination. 2. Previously biopsied somewhat spiculated 2.1 cm area of nodular consolidation within the right upper lobe has increased in size compared to the 12/14/2022 examination, previously, 1.1 cm. Correlation with prior biopsy results is advised. Further evaluation with a repeat PET-CT could be performed as indicated. 3. Diffuse decreased  attenuation of the hepatic parenchyma with mild nodularity of the hepatic contour as could be seen in the setting of early cirrhotic change. Further evaluation with LFTs could be performed as indicated. 4. Aortic Atherosclerosis (ICD10-I70.0) and Emphysema (ICD10-J43.9). Electronically Signed   By: Simonne Come M.D.   On: 06/30/2023 17:25    Assessment & Plan:   Essential hypertension, benign- His blood pressure is adequately well-controlled. EKG is negative for LVH. -     Basic metabolic panel; Future -     Hepatic function panel; Future -     EKG 12-Lead -     Olmesartan Medoxomil; Take 1 tablet (20 mg total) by mouth daily.  Dispense: 90 tablet; Refill: 0  Statin myopathy- He is not willing to take a statin.  Type II diabetes mellitus with manifestations (HCC)- Will try to get better control of his blood sugar. -     HM Diabetes Foot Exam -     Basic metabolic panel; Future -     Hepatic function panel; Future -     Hemoglobin A1c; Future -     Rybelsus; Take 1 tablet (3 mg total) by mouth daily.  Dispense: 30 tablet; Refill: 0 -     Olmesartan Medoxomil; Take 1 tablet (20 mg total) by mouth daily.  Dispense: 90 tablet; Refill: 0 -     metFORMIN HCl ER; Take 1 tablet (750 mg total) by mouth daily with breakfast.  Dispense: 90 tablet; Refill: 0  Chronic respiratory failure with hypoxia (HCC)- This is stable. -     CBC with Differential/Platelet; Future  DOE (dyspnea on exertion)- EKG and labs are reassuring. -     Troponin I (High Sensitivity); Future -     Brain natriuretic peptide; Future -     EKG 12-Lead     Follow-up: Return in about 6 months (around 02/11/2024).  Sanda Linger, MD

## 2023-08-16 ENCOUNTER — Encounter: Payer: Self-pay | Admitting: Internal Medicine

## 2023-08-19 ENCOUNTER — Other Ambulatory Visit: Payer: Self-pay | Admitting: Internal Medicine

## 2023-08-19 DIAGNOSIS — E785 Hyperlipidemia, unspecified: Secondary | ICD-10-CM

## 2023-08-28 DIAGNOSIS — E119 Type 2 diabetes mellitus without complications: Secondary | ICD-10-CM | POA: Diagnosis not present

## 2023-11-08 ENCOUNTER — Other Ambulatory Visit: Payer: Self-pay | Admitting: Internal Medicine

## 2023-11-08 DIAGNOSIS — E118 Type 2 diabetes mellitus with unspecified complications: Secondary | ICD-10-CM

## 2023-11-08 DIAGNOSIS — I10 Essential (primary) hypertension: Secondary | ICD-10-CM

## 2023-11-09 ENCOUNTER — Other Ambulatory Visit: Payer: Self-pay | Admitting: Internal Medicine

## 2023-11-09 DIAGNOSIS — E118 Type 2 diabetes mellitus with unspecified complications: Secondary | ICD-10-CM

## 2023-12-31 ENCOUNTER — Ambulatory Visit (INDEPENDENT_AMBULATORY_CARE_PROVIDER_SITE_OTHER): Payer: Medicare Other

## 2023-12-31 VITALS — Ht 69.0 in | Wt 210.0 lb

## 2023-12-31 DIAGNOSIS — E118 Type 2 diabetes mellitus with unspecified complications: Secondary | ICD-10-CM

## 2023-12-31 DIAGNOSIS — Z0000 Encounter for general adult medical examination without abnormal findings: Secondary | ICD-10-CM

## 2023-12-31 NOTE — Patient Instructions (Signed)
 [name] , Thank you for taking time to come for your Medicare Wellness Visit. I appreciate your ongoing commitment to your health goals. Please review the following plan we discussed and let me know if I can assist you in the future.   Referrals/Orders/Follow-Ups/Clinician Recommendations: It was nice talking to you today.  Each day, aim for 6 glasses of water, plenty of protein in your diet and try to get up and walk/ stretch every hour for 5-10 minutes at a time.  Keep up the good work.    This is a list of the screening recommended for you and due dates:  Health Maintenance  Topic Date Due   COVID-19 Vaccine (4 - 2024-25 season) 06/17/2023   Eye exam for diabetics  07/26/2023   Yearly kidney health urinalysis for diabetes  11/02/2023   Hemoglobin A1C  02/11/2024   Yearly kidney function blood test for diabetes  08/12/2024   Complete foot exam   08/12/2024   Cologuard (Stool DNA test)  11/02/2024   Medicare Annual Wellness Visit  12/30/2024   DTaP/Tdap/Td vaccine (3 - Td or Tdap) 01/11/2026   Pneumonia Vaccine  Completed   Flu Shot  Completed   Hepatitis C Screening  Completed   Zoster (Shingles) Vaccine  Completed   HPV Vaccine  Aged Out   Colon Cancer Screening  Discontinued    Advanced directives: (Copy Requested) Please bring a copy of your health care power of attorney and living will to the office to be added to your chart at your convenience. You can mail to [facility] [address] [place], [place] [address] or email to [email or web address]  Next Medicare Annual Wellness Visit scheduled for next year: Yes

## 2023-12-31 NOTE — Progress Notes (Signed)
 Subjective:   [name] is a 68 y.o. who presents for a Medicare Wellness preventive visit.  Visit Complete: Virtual I connected with  [name] on 12/31/23 by a audio enabled telemedicine application and verified that I am speaking with the correct person using two identifiers.  Patient Location: Home  Provider Location: Home Office  I discussed the limitations of evaluation and management by telemedicine. The patient expressed understanding and agreed to proceed.  Vital Signs: Because this visit was a virtual/telehealth visit, some criteria may be missing or patient reported. Any vitals not documented were not able to be obtained and vitals that have been documented are patient reported.  VideoDeclined- This patient declined Librarian, academic. Therefore the visit was completed with audio only.  Persons Participating in Visit: Patient.  AWV Questionnaire: No: Patient Medicare AWV questionnaire was not completed prior to this visit.  Cardiac Risk Factors include: advanced age (>11men, >4 women);hypertension;male gender;Other (see comment);diabetes mellitus;dyslipidemia;obesity (BMI >30kg/m2), Risk factor comments: EMPHYSEMA   Thoracic aortic aneurysm     Objective:    Today's Vitals   12/31/23 1402  Weight: 210 lb (95.3 kg)  Height: 5\' 9"  (1.753 m)   Body mass index is 31.01 kg/m.     12/31/2023    2:31 PM 07/24/2023    3:25 PM 08/11/2022    8:56 AM 06/13/2022    7:25 AM 10/27/2021    5:43 PM 07/28/2021   12:29 PM 11/27/2020   12:04 AM  Advanced Directives  Does Patient Have a Medical Advance Directive? Yes No No No No No No  Type of Estate agent of [place];Living will        Copy of Healthcare Power of Attorney in Chart? No - copy requested        Would patient like information on creating a medical advance directive?   No - Patient declined No - Patient declined No - Patient declined No - Patient declined No  - Patient declined    Current Medications (verified) Outpatient Encounter Medications as of 12/31/2023  Medication Sig   aspirin 81 MG EC tablet Take 81 mg by mouth at bedtime.   Blood Glucose Monitoring Suppl (ONETOUCH VERIO) w/Device KIT 1 Act by Does not apply route in the morning and at bedtime.   Carboxymethylcellul-Glycerin (LUBRICATING EYE DROPS OP) Place 1 drop into both eyes daily as needed (dry eyes).   [employer] D3 50 MCG (2000 UT) CAPS TAKE 2 CAPSULES (4,000 UNITS TOTAL) BY MOUTH DAILY.   ezetimibe (ZETIA) 10 MG tablet TAKE 1 TABLET BY MOUTH EVERY DAY   famotidine (PEPCID) 40 MG tablet TAKE 1 TABLET BY MOUTH EVERY DAY   fexofenadine (ALLEGRA) 180 MG tablet Take 180 mg by mouth daily.   glucose blood (ONETOUCH VERIO) test strip 1 each by Other route in the morning and at bedtime. Use as instructed   ibuprofen (ADVIL) 200 MG tablet Take 400 mg by mouth every 6 (six) hours as needed for moderate pain (pain score 4-6).   KRILL OIL PO Take 400 mg by mouth daily.   Lancets (ONETOUCH ULTRASOFT) lancets    metFORMIN (GLUCOPHAGE-XR) 750 MG 24 hr tablet TAKE 1 TABLET BY MOUTH EVERY DAY WITH BREAKFAST   NON FORMULARY Oxygen continuous 1.5 liters with activity   olmesartan (BENICAR) 20 MG tablet TAKE 1 TABLET BY MOUTH EVERY DAY   ONETOUCH ULTRA test strip USE TO TEST UP TO 4 TIMES DAILY AS DIRECTED   ONETOUCH VERIO  test strip USE 1 PER DAY   sodium chloride (OCEAN) 0.65 % SOLN nasal spray Place 1 spray into both nostrils as needed for congestion.   Tetrahydrozoline HCl (VISINE OP) Place 1 drop into both eyes daily as needed (allergies).   Tiotropium Bromide-Olodaterol (STIOLTO RESPIMAT) 2.5-2.5 MCG/ACT AERS Inhale 2 puffs into the lungs daily. (Patient taking differently: Inhale 2 puffs into the lungs daily as needed (shortness of breath).)   trolamine salicylate (ASPERCREME) 10 % cream Apply 1 Application topically as needed for muscle pain.   Semaglutide (RYBELSUS) 3 MG TABS Take 1 tablet (3 mg  total) by mouth daily. (Patient not taking: Reported on 12/31/2023)   No facility-administered encounter medications on file as of 12/31/2023.    Allergies (verified) Morphine, Statins, and Latex   History: Past Medical History:  Diagnosis Date   Aortic aneurysm (HCC)    COPD (chronic obstructive pulmonary disease) (HCC) 09/23/2008   -PFT 09/23/08 FEV1 2.89(84%), FVC 3.70 (78%), TLC 5.28 (78%), DLCO 64% mixed obstructive and  restrictive pattern.  Spirometry 03/11/09 FEV 3.35 (97%),FVC 4.10 (86%), FEV% 82. Steroid responsive pneumonitis 04/09- required mechanical ventilation during hospitalization - The following hospital test were negative: PCP,ANA,HIV,BAL. - ACE25,RF20,CRP 28,A1AT 263, ESR 109   Depression    Diabetes mellitus without complication (HCC)    Dyslipidemia    Dyslipidemia    Emphysema/COPD (HCC)    GERD (gastroesophageal reflux disease)    History of skin cancer    Hypertension    Echo 10/09 EF 60%   ILD (interstitial lung disease) (HCC)    Panic attack    Pneumonia    2009 "put on a vent"   Past Surgical History:  Procedure Laterality Date   BRONCHIAL WASHINGS  06/13/2022   Procedure: BRONCHIAL WASHINGS;  Surgeon: [name], DO;  Location: [facility];  Service: Pulmonary;;   BRONCHOSCOPY  10/09   Ganglion cyst on foot     HERNIA REPAIR     2 hernia   Left shoulder surgery     Family History  Problem Relation Age of Onset   Heart attack Father 67   Diabetes Father    Heart attack Brother    Emphysema Brother    Diabetes Brother    Social History   Socioeconomic History   Marital status: Divorced    Spouse name: Not on file   Number of children: 0   Years of education: 10th grade returned for GED   Highest education level: GED or equivalent  Occupational History   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    Social Drivers of Health   Financial Resource Strain: Low Risk  (12/31/2023)   Overall Financial Resource Strain (CARDIA)    Difficulty of Paying Living Expenses: Not hard at all  Food Insecurity: No Food Insecurity (12/31/2023)   Hunger Vital Sign    Worried About Running Out of Food in the Last Year: Never true    Ran Out of Food in the Last Year: Never true  Transportation Needs: No Transportation Needs (12/31/2023)   PRAPARE - Administrator, Civil Service (Medical): No    Lack of Transportation (Non-Medical): No  Physical Activity: Inactive (12/31/2023)   Exercise Vital Sign    Days of Exercise per Week: 0 days    Minutes of Exercise per Session: 0 min  Stress: No Stress Concern Present (12/31/2023)   [employer] of Occupational Health - Occupational Stress Questionnaire    Feeling of Stress : Not at all  Social Connections: Socially Isolated (12/31/2023)   Social Connection and Isolation Panel [NHANES]    Frequency of Communication with Friends and Family: More than three times a week    Frequency of Social Gatherings with Friends and Family: Once a week    Attends Religious Services: Never    Loss adjuster, chartered or Organizations: No    Attends Engineer, structural: Never    Marital Status: Divorced    Tobacco Counseling Counseling given: Not Answered    Clinical Intake:  Pre-visit preparation completed: Yes  Pain : No/denies pain     BMI - recorded: 31.01 Nutritional Status: BMI > 30  Obese Nutritional Risks: None Diabetes: Yes CBG done?: Yes (169 per pt) CBG resulted in Enter/ Edit results?: No Did pt. bring in CBG monitor from home?: No  How often do you need to have someone help you when you read instructions, pamphlets, or other written materials from your doctor or pharmacy?: 1 - Never  Interpreter Needed?: No  Information entered by :: Tyge Somers, RMA   Activities of Daily Living    12/31/2023    2:07 PM 07/24/2023    3:21 PM  In your present state of health, do you have any difficulty performing the following activities:  Hearing? 0 0  Vision? 0 0  Difficulty concentrating or making decisions? 0 0  Walking or climbing stairs? 0 0  Dressing or bathing? 0 0  Doing errands, shopping? 0 0  Preparing Food and eating ? N N  Using the Toilet? N N  In the past six months, have you accidently leaked urine? N N  Do you have problems with loss of bowel control? N N  Managing your Medications? N N  Managing your Finances? N N  Housekeeping or managing your Housekeeping? N N    Patient Care Team: Etta Grandchild, MD as PCP - General (Internal Medicine) Coralyn Helling, MD (Inactive) as Consulting Physician (Pulmonary Disease) End, Cristal Deer, MD as Consulting Physician (Cardiology) Szabat, Vinnie Level, [facility] (Inactive) (Pharmacist)  Indicate any recent Medical Services you may have received from other than [facility] providers in the past year (date may be approximate).     Assessment:   This is a routine wellness examination for Densel.  Hearing/Vision screen Hearing Screening - Comments:: Denies hearing difficulties   Vision Screening - Comments:: Wears  eyeglasses   Goals Addressed               This Visit's Progress     Patient Stated (pt-stated)   On track     Live as long as he could to get and about.       Depression Screen     12/31/2023    2:38 PM 07/24/2023    3:33 PM 08/11/2022    8:54 AM 11/16/2021    1:15 PM 08/23/2021    6:13 PM 07/28/2021   12:31 PM 04/25/2021   10:46 AM  PHQ 2/9 Scores  PHQ - 2 Score 0 0 0 0 1 0 1  PHQ- 9 Score 0 0         Fall Risk     12/31/2023    2:31 PM 07/24/2023    3:26 PM 08/11/2022    8:52 AM 04/28/2022    9:40 AM 02/14/2022    9:45 AM  Fall Risk   Falls in the past year? 0 0 0 0 0  Comment    continues to deny falls x last 12 months; does not require/ use assistive devices continues to deny falls- does not use assistive devices  Number falls in past yr: 0 0 0 0 0  Injury with Fall? 0 0 0 0 0  Comment     N/A- denies falls x 12 months  Risk for fall due to : No Fall Risks No Fall Risks No Fall Risks No Fall Risks Medication side effect  Follow up Falls prevention discussed;Falls evaluation completed Falls prevention discussed;Falls evaluation completed Falls prevention discussed Falls prevention discussed Falls prevention discussed    MEDICARE RISK AT HOME:  Medicare Risk at Home Any stairs in or around the home?: Yes (outside front and back porch) If so, are there any without handrails?: Yes Home free of loose throw rugs in walkways, pet beds, electrical cords, etc?: Yes Adequate lighting in your home to reduce risk of falls?: Yes Life alert?: No Use of a cane, walker or w/c?: No Grab bars in the bathroom?: Yes Shower chair or bench in shower?: Yes Elevated toilet seat or a handicapped toilet?: Yes  TIMED UP AND GO:  Was the test performed?  No  Cognitive Function: 6CIT completed        12/31/2023    2:09 PM 07/24/2023    3:28 PM 08/11/2022    8:55 AM 04/22/2020    1:13 PM  6CIT Screen  What Year? 0 points 0 points 0 points 0 points  What month? 0 points 0 points 0  points 0 points  What time? 0 points 0 points 0 points 0 points  Count back from 20 0 points 0 points 0 points 0 points  Months in reverse 0 points 0 points 0 points 0 points  Repeat phrase 0 points 0 points 0 points 0 points  Total Score 0 points 0 points 0 points 0 points    Immunizations Immunization History  Administered Date(s) Administered   Fluad Quad(high Dose 65+) 06/24/2019, 07/28/2021   Fluad Trivalent(High Dose 65+) 07/12/2023   Influenza Split 07/05/2011, 07/16/2012   Influenza Whole 07/21/2009, 08/22/2010   Influenza, High Dose Seasonal PF 07/17/2022   Influenza,inj,Quad PF,6+ Mos 06/25/2013, 07/13/2014, 07/15/2015, 08/08/2016, 06/11/2017, 06/12/2018   Influenza-Unspecified 07/16/2020   Moderna Sars-Covid-2 Vaccination 12/16/2019, 01/11/2020, 03/27/2021   Pneumococcal Conjugate-13 06/25/2013   Pneumococcal Polysaccharide-23 08/17/2008, 08/08/2016, 04/26/2022   Respiratory Syncytial Virus Vaccine,Recomb Aduvanted(Arexvy) 08/02/2022   Td 10/16/2005  Tdap 01/12/2016   Zoster Recombinant(Shingrix) 04/28/2021, 07/05/2021   Zoster, Live 05/08/2016    Screening Tests Health Maintenance  Topic Date Due   COVID-19 Vaccine (4 - 2024-25 season) 06/17/2023   OPHTHALMOLOGY EXAM  07/26/2023   Diabetic kidney evaluation - Urine ACR  11/02/2023   HEMOGLOBIN A1C  02/11/2024   Medicare Annual Wellness (AWV)  07/23/2024   Diabetic kidney evaluation - eGFR measurement  08/12/2024   FOOT EXAM  08/12/2024   Fecal DNA (Cologuard)  11/02/2024   DTaP/Tdap/Td (3 - Td or Tdap) 01/11/2026   Pneumonia Vaccine 39+ Years old  Completed   INFLUENZA VACCINE  Completed   Hepatitis C Screening  Completed   Zoster Vaccines- Shingrix  Completed   HPV VACCINES  Aged Out   Colonoscopy  Discontinued    Health Maintenance  Health Maintenance Due  Topic Date Due   COVID-19 Vaccine (4 - 2024-25 season) 06/17/2023   OPHTHALMOLOGY EXAM  07/26/2023   Diabetic kidney evaluation - Urine ACR   11/02/2023   Health Maintenance Items Addressed: See Nurse Notes  Additional Screening:  Vision Screening: Recommended annual ophthalmology exams for early detection of glaucoma and other disorders of the eye.  Dental Screening: Recommended annual dental exams for proper oral hygiene  Community Resource Referral / Chronic Care Management: CRR required this visit?  No   CCM required this visit?  No     Plan:     I have personally reviewed and noted the following in the patient's chart:   Medical and social history Use of alcohol, tobacco or illicit drugs  Current medications and supplements including opioid prescriptions. Patient is not currently taking opioid prescriptions. Functional ability and status Nutritional status Physical activity Advanced directives List of other physicians Hospitalizations, surgeries, and ER visits in previous 12 months Vitals Screenings to include cognitive, depression, and falls Referrals and appointments  In addition, I have reviewed and discussed with patient certain preventive protocols, quality metrics, and best practice recommendations. A written personalized care plan for preventive services as well as general preventive health recommendations were provided to patient.     Eusevio Schriver L Yetunde Leis, CMA   12/31/2023   After Visit Summary: (Mail) Due to this being a telephonic visit, the after visit summary with patients personalized plan was offered to patient via mail   Notes: Please refer to Routing Comments.

## 2024-02-02 ENCOUNTER — Other Ambulatory Visit: Payer: Self-pay | Admitting: Internal Medicine

## 2024-02-02 DIAGNOSIS — E118 Type 2 diabetes mellitus with unspecified complications: Secondary | ICD-10-CM

## 2024-02-02 DIAGNOSIS — I10 Essential (primary) hypertension: Secondary | ICD-10-CM

## 2024-02-04 ENCOUNTER — Other Ambulatory Visit: Payer: Self-pay | Admitting: Internal Medicine

## 2024-02-04 DIAGNOSIS — E118 Type 2 diabetes mellitus with unspecified complications: Secondary | ICD-10-CM

## 2024-02-12 ENCOUNTER — Encounter: Payer: Self-pay | Admitting: Internal Medicine

## 2024-02-12 ENCOUNTER — Ambulatory Visit (INDEPENDENT_AMBULATORY_CARE_PROVIDER_SITE_OTHER)

## 2024-02-12 ENCOUNTER — Ambulatory Visit: Payer: Medicare Other | Admitting: Internal Medicine

## 2024-02-12 VITALS — BP 142/86 | HR 95 | Temp 98.2°F | Ht 69.0 in | Wt 205.6 lb

## 2024-02-12 DIAGNOSIS — R052 Subacute cough: Secondary | ICD-10-CM | POA: Diagnosis not present

## 2024-02-12 DIAGNOSIS — Z0001 Encounter for general adult medical examination with abnormal findings: Secondary | ICD-10-CM

## 2024-02-12 DIAGNOSIS — J8410 Pulmonary fibrosis, unspecified: Secondary | ICD-10-CM

## 2024-02-12 DIAGNOSIS — Z0000 Encounter for general adult medical examination without abnormal findings: Secondary | ICD-10-CM

## 2024-02-12 DIAGNOSIS — J438 Other emphysema: Secondary | ICD-10-CM | POA: Diagnosis not present

## 2024-02-12 DIAGNOSIS — E785 Hyperlipidemia, unspecified: Secondary | ICD-10-CM | POA: Diagnosis not present

## 2024-02-12 DIAGNOSIS — E118 Type 2 diabetes mellitus with unspecified complications: Secondary | ICD-10-CM | POA: Diagnosis not present

## 2024-02-12 DIAGNOSIS — J22 Unspecified acute lower respiratory infection: Secondary | ICD-10-CM | POA: Diagnosis not present

## 2024-02-12 DIAGNOSIS — G720 Drug-induced myopathy: Secondary | ICD-10-CM

## 2024-02-12 DIAGNOSIS — T466X5A Adverse effect of antihyperlipidemic and antiarteriosclerotic drugs, initial encounter: Secondary | ICD-10-CM

## 2024-02-12 DIAGNOSIS — N3943 Post-void dribbling: Secondary | ICD-10-CM

## 2024-02-12 DIAGNOSIS — J439 Emphysema, unspecified: Secondary | ICD-10-CM | POA: Diagnosis not present

## 2024-02-12 DIAGNOSIS — I10 Essential (primary) hypertension: Secondary | ICD-10-CM | POA: Diagnosis not present

## 2024-02-12 DIAGNOSIS — N401 Enlarged prostate with lower urinary tract symptoms: Secondary | ICD-10-CM | POA: Diagnosis not present

## 2024-02-12 DIAGNOSIS — J40 Bronchitis, not specified as acute or chronic: Secondary | ICD-10-CM | POA: Diagnosis not present

## 2024-02-12 DIAGNOSIS — R058 Other specified cough: Secondary | ICD-10-CM | POA: Diagnosis not present

## 2024-02-12 LAB — URINALYSIS, ROUTINE W REFLEX MICROSCOPIC
Bilirubin Urine: NEGATIVE
Hgb urine dipstick: NEGATIVE
Ketones, ur: NEGATIVE
Leukocytes,Ua: NEGATIVE
Nitrite: NEGATIVE
RBC / HPF: NONE SEEN (ref 0–?)
Specific Gravity, Urine: <=1.005 — AB (ref 1.000–1.030)
Total Protein, Urine: NEGATIVE
Urine Glucose: NEGATIVE
Urobilinogen, UA: 0.2 (ref 0.0–1.0)
WBC, UA: NONE SEEN (ref 0–?)
pH: 6 (ref 5.0–8.0)

## 2024-02-12 LAB — CBC WITH DIFFERENTIAL/PLATELET
Basophils Absolute: 0.1 10*3/uL (ref 0.0–0.1)
Basophils Relative: 1.2 % (ref 0.0–3.0)
Eosinophils Absolute: 0.2 10*3/uL (ref 0.0–0.7)
Eosinophils Relative: 1.6 % (ref 0.0–5.0)
HCT: 42.6 % (ref 39.0–52.0)
Hemoglobin: 14.3 g/dL (ref 13.0–17.0)
Lymphocytes Relative: 20.5 % (ref 12.0–46.0)
Lymphs Abs: 2 10*3/uL (ref 0.7–4.0)
MCHC: 33.6 g/dL (ref 30.0–36.0)
MCV: 89.2 fl (ref 78.0–100.0)
Monocytes Absolute: 1 10*3/uL (ref 0.1–1.0)
Monocytes Relative: 9.8 % (ref 3.0–12.0)
Neutro Abs: 6.6 10*3/uL (ref 1.4–7.7)
Neutrophils Relative %: 66.9 % (ref 43.0–77.0)
Platelets: 318 10*3/uL (ref 150.0–400.0)
RBC: 4.77 Mil/uL (ref 4.22–5.81)
RDW: 13.9 % (ref 11.5–15.5)
WBC: 9.8 10*3/uL (ref 4.0–10.5)

## 2024-02-12 LAB — BASIC METABOLIC PANEL WITH GFR
BUN: 13 mg/dL (ref 6–23)
CO2: 23 meq/L (ref 19–32)
Calcium: 9.7 mg/dL (ref 8.4–10.5)
Chloride: 105 meq/L (ref 96–112)
Creatinine, Ser: 0.88 mg/dL (ref 0.40–1.50)
GFR: 87.35 mL/min (ref >60.00)
Glucose, Bld: 94 mg/dL (ref 70–99)
Potassium: 4.4 meq/L (ref 3.5–5.1)
Sodium: 138 meq/L (ref 135–145)

## 2024-02-12 LAB — HEPATIC FUNCTION PANEL
ALT: 26 U/L (ref 0–53)
AST: 22 U/L (ref 0–37)
Albumin: 4.4 g/dL (ref 3.5–5.2)
Alkaline Phosphatase: 79 U/L (ref 39–117)
Bilirubin, Direct: 0.1 mg/dL (ref 0.0–0.3)
Total Bilirubin: 0.5 mg/dL (ref 0.2–1.2)
Total Protein: 6.7 g/dL (ref 6.0–8.3)

## 2024-02-12 LAB — PSA: PSA: 0.53 ng/mL (ref 0.10–4.00)

## 2024-02-12 LAB — LIPID PANEL
Cholesterol: 172 mg/dL (ref 0–200)
HDL: 35.6 mg/dL — ABNORMAL LOW (ref >39.00)
LDL Cholesterol: 95 mg/dL (ref 0–99)
NonHDL: 136.75
Total CHOL/HDL Ratio: 5
Triglycerides: 210 mg/dL — ABNORMAL HIGH (ref 0.0–149.0)
VLDL: 42 mg/dL — ABNORMAL HIGH (ref 0.0–40.0)

## 2024-02-12 LAB — MICROALBUMIN / CREATININE URINE RATIO
Creatinine,U: 45.9 mg/dL
Microalb Creat Ratio: UNDETERMINED mg/g (ref 0.0–30.0)
Microalb, Ur: <0.7 mg/dL

## 2024-02-12 LAB — TSH: TSH: 2.08 u[IU]/mL (ref 0.35–5.50)

## 2024-02-12 LAB — HEMOGLOBIN A1C: Hgb A1c MFr Bld: 7.6 % — ABNORMAL HIGH (ref 4.6–6.5)

## 2024-02-12 MED ORDER — OLMESARTAN MEDOXOMIL 20 MG PO TABS: 20.0000 mg | ORAL_TABLET | Freq: Every day | ORAL | 1 refills | Status: DC

## 2024-02-12 MED ORDER — AMOXICILLIN-POT CLAVULANATE 875-125 MG PO TABS: 1.0000 | ORAL_TABLET | Freq: Two times a day (BID) | ORAL | 0 refills | Status: AC

## 2024-02-12 MED ORDER — METFORMIN HCL ER 750 MG PO TB24: 1500.0000 mg | ORAL_TABLET | Freq: Every day | ORAL | 1 refills | Status: DC

## 2024-02-12 NOTE — Progress Notes (Signed)
 Subjective:  Patient ID: [name], male    DOB: 04/11/1956  Age: 68 y.o. MRN: [id number]  CC: Annual Exam, Hypertension, Hyperlipidemia, and Diabetes   HPI [name] presents for a CPX and f/up ---  Discussed the use of AI scribe software for clinical note transcription with the patient, who gave verbal consent to proceed.  History of Present Illness   [name] is a 68 year old male who presents with allergy symptoms and concerns about oxygen  levels.  He experiences allergy symptoms, including sneezing. He uses Allegra during allergy season but is concerned about its long-term effectiveness.  He reports episodes of oxygen  desaturation, particularly when outside, leading to shortness of breath. No chest pain is present. He has a history of coughing and wheezing, which have resolved in the past three to five days.  He has noticed fluctuations in his weight and is actively trying to lose weight by modifying his diet to manage his blood sugar levels. His blood sugar has averaged 162 mg/dL over the past 30 days, with a decrease to 146 mg/dL in the past week.  He reports occasional episodes of frequent urination but denies any painful urination.   He mentions that his blood pressure has been fluctuating, with recent readings showing some elevation. He uses an electronic blood pressure monitor at home but questions its accuracy.  No current headache, nausea, vomiting, or chest pain. Past episodes of coughing and wheezing have resolved.       Outpatient Medications Prior to Visit  Medication Sig Dispense Refill   aspirin 81 MG EC tablet Take 81 mg by mouth at bedtime.     Blood Glucose Monitoring Suppl (ONETOUCH VERIO) w/Device KIT 1 Act by Does not apply route in the morning and at bedtime. 1 kit 2   Carboxymethylcellul-Glycerin (LUBRICATING EYE DROPS OP) Place 1 drop into both eyes daily as needed (dry eyes).     [employer] D3 50 MCG (2000 UT) CAPS TAKE 2 CAPSULES (4,000 UNITS  TOTAL) BY MOUTH DAILY. 180 capsule 1   ezetimibe  (ZETIA ) 10 MG tablet TAKE 1 TABLET BY MOUTH EVERY DAY 90 tablet 1   famotidine  (PEPCID ) 40 MG tablet TAKE 1 TABLET BY MOUTH EVERY DAY 90 tablet 1   fexofenadine (ALLEGRA) 180 MG tablet Take 180 mg by mouth daily.     glucose blood (ONETOUCH VERIO) test strip 1 each by Other route in the morning and at bedtime. Use as instructed 200 each 3   KRILL OIL PO Take 400 mg by mouth daily.     Lancets (ONETOUCH ULTRASOFT) lancets      NON FORMULARY Oxygen  continuous 1.5 liters with activity     ONETOUCH ULTRA test strip USE TO TEST UP TO 4 TIMES DAILY AS DIRECTED 100 strip 5   ONETOUCH VERIO test strip USE 1 PER DAY 100 each 2   sodium chloride (OCEAN) 0.65 % SOLN nasal spray Place 1 spray into both nostrils as needed for congestion.     Tiotropium Bromide -Olodaterol (STIOLTO RESPIMAT ) 2.5-2.5 MCG/ACT AERS Inhale 2 puffs into the lungs daily. (Patient taking differently: Inhale 2 puffs into the lungs daily as needed (shortness of breath).) 12 g 1   ibuprofen (ADVIL) 200 MG tablet Take 400 mg by mouth every 6 (six) hours as needed for moderate pain (pain score 4-6).     metFORMIN  (GLUCOPHAGE -XR) 750 MG 24 hr tablet TAKE 1 TABLET BY MOUTH EVERY DAY WITH BREAKFAST 90 tablet 0   olmesartan  (BENICAR )  20 MG tablet TAKE 1 TABLET BY MOUTH EVERY DAY 90 tablet 0   Tetrahydrozoline HCl (VISINE OP) Place 1 drop into both eyes daily as needed (allergies).     trolamine salicylate (ASPERCREME) 10 % cream Apply 1 Application topically as needed for muscle pain.     Semaglutide  (RYBELSUS ) 3 MG TABS Take 1 tablet (3 mg total) by mouth daily. 30 tablet 0   No facility-administered medications prior to visit.    ROS Review of Systems  Constitutional:  Negative for appetite change, chills, diaphoresis, fatigue and fever.  HENT: Negative.    Eyes:  Negative for visual disturbance.  Respiratory:  Positive for cough and shortness of breath. Negative for chest tightness and  wheezing.        Worsening productive cough for one week  Cardiovascular:  Negative for chest pain, palpitations and leg swelling.  Gastrointestinal: Negative.  Negative for abdominal pain, constipation, diarrhea, nausea and vomiting.  Endocrine: Positive for polyuria. Negative for polydipsia and polyphagia.  Genitourinary:  Negative for difficulty urinating, dysuria, penile discharge, penile pain, penile swelling, scrotal swelling and testicular pain.  Musculoskeletal:  Negative for arthralgias and myalgias.  Skin: Negative.   Neurological: Negative.  Negative for dizziness, weakness and numbness.  Hematological:  Negative for adenopathy. Does not bruise/bleed easily.  Psychiatric/Behavioral: Negative.      Objective:  BP (!) 142/86 (BP Location: Left Arm, Patient Position: Sitting, Cuff Size: Normal)   Pulse 95   Temp 98.2 F (36.8 C) (Oral)   Ht 5\' 9"  (1.753 m)   Wt 205 lb 9.6 oz (93.3 kg)   SpO2 96%   BMI 30.36 kg/m   BP Readings from Last 3 Encounters:  02/12/24 (!) 142/86  08/13/23 130/88  12/29/22 100/80    Wt Readings from Last 3 Encounters:  02/12/24 205 lb 9.6 oz (93.3 kg)  12/31/23 210 lb (95.3 kg)  08/13/23 210 lb 12.8 oz (95.6 kg)    Physical Exam Vitals reviewed.  Constitutional:      Appearance: Normal appearance.  Eyes:     General: No scleral icterus.    Conjunctiva/sclera: Conjunctivae normal.  Cardiovascular:     Rate and Rhythm: Normal rate and regular rhythm.     Heart sounds: No murmur heard.    No friction rub. No gallop.     Comments: EKG ---  NSR, 81 bpm No LVH, Q waves, or ST/T wave changes Unchanged  Pulmonary:     Effort: No respiratory distress.     Breath sounds: Examination of the right-lower field reveals rales. Rales present. No decreased breath sounds, wheezing or rhonchi.  Chest:     Chest wall: No tenderness.  Abdominal:     General: Abdomen is flat. Bowel sounds are normal. There is no distension.     Palpations: Abdomen  is soft. There is no hepatomegaly, splenomegaly or mass.     Tenderness: There is no abdominal tenderness. There is no guarding.     Hernia: No hernia is present.  Genitourinary:    Comments: GU/rectal deferred at his request Musculoskeletal:        General: Normal range of motion.     Cervical back: Neck supple.     Right lower leg: No edema.     Left lower leg: No edema.  Lymphadenopathy:     Cervical: No cervical adenopathy.  Skin:    General: Skin is warm and dry.  Neurological:     General: No focal deficit present.  Mental Status: He is alert. Mental status is at baseline.  Psychiatric:        Mood and Affect: Mood normal.        Behavior: Behavior normal.     Lab Results  Component Value Date   WBC 9.8 02/12/2024   HGB 14.3 02/12/2024   HCT 42.6 02/12/2024   PLT 318.0 02/12/2024   GLUCOSE 94 02/12/2024   CHOL 172 02/12/2024   TRIG 210.0 (H) 02/12/2024   HDL 35.60 (L) 02/12/2024   LDLDIRECT 117.0 04/26/2022   LDLCALC 95 02/12/2024   ALT 26 02/12/2024   AST 22 02/12/2024   NA 138 02/12/2024   K 4.4 02/12/2024   CL 105 02/12/2024   CREATININE 0.88 02/12/2024   BUN 13 02/12/2024   CO2 23 02/12/2024   TSH 2.08 02/12/2024   PSA 0.53 02/12/2024   INR 1.2 08/04/2008   HGBA1C 7.6 (H) 02/12/2024   MICROALBUR <0.7 02/12/2024    CT ANGIO CHEST AORTA W/CM & OR WO/CM Result Date: 06/30/2023 CLINICAL DATA:  Follow-up abdominal aortic aneurysm. Former smoker. History of diabetes and COPD. History of prior lung nodule biopsy. EXAM: CT ANGIOGRAPHY CHEST WITH CONTRAST TECHNIQUE: Multidetector CT imaging of the chest was performed using the standard protocol during bolus administration of intravenous contrast. Multiplanar CT image reconstructions and MIPs were obtained to evaluate the vascular anatomy. RADIATION DOSE REDUCTION: This exam was performed according to the departmental dose-optimization program which includes automated exposure control, adjustment of the mA and/or  kV according to patient size and/or use of iterative reconstruction technique. CONTRAST:  75mL OMNIPAQUE  IOHEXOL  350 MG/ML SOLN COMPARISON:  Chest CT-12/14/2022; 05/03/2022; 12/19/2016; PET-CT-06/07/2022 FINDINGS: Vascular Findings: Stable fusiform ectasia dilatation of the ascending thoracic aorta with measurements as follows. There is a moderate-to-large amount of slightly irregular mixed calcified and noncalcified atherosclerotic plaque involving the aortic arch and descending thoracic aorta, not resulting in a hemodynamically significant stenosis. No evidence of abdominal aortic dissection or perivascular stranding on this nongated examination. Conventional configuration of the aortic arch. The branch vessels of the aortic arch are diseased at their origin though without hemodynamically significant narrowing throughout their imaged courses. The descending thoracic aorta is diseased though without hemodynamically significant narrowing and of normal caliber. Normal heart size. Suspected coronary artery calcifications. No pericardial effusion. Although this examination was not tailored for the evaluation the pulmonary arteries, there are no discrete filling defects within the central pulmonary arterial tree to suggest central pulmonary embolism. Normal caliber of the main pulmonary artery. ------------------------------------------------------------- Thoracic aortic measurements: SINOTUBULAR JUNCTION: 36 mm as measured in greatest oblique short axis coronal dimension. PROXIMAL ASCENDING THORACIC AORTA: 39 mm as measured in greatest oblique short axis axial dimension (axial image 69, series 4) at the level of the main pulmonary artery and approximately 39 mm as measured in greatest oblique short axis coronal dimension (coronal image 99, series 7), unchanged compared to the 12/2016 examination by my direct remeasurement. AORTIC ARCH: 32 mm as measured in greatest oblique short axis sagittal dimension. PROXIMAL  DESCENDING THORACIC AORTA: 30 mm as measured in greatest oblique short axis axial dimension at the level of the main pulmonary artery. DISTAL DESCENDING THORACIC AORTA: 20 mm as measured in greatest oblique short axis axial dimension at the level of the diaphragmatic hiatus. Review of the MIP images confirms the above findings. ------------------------------------------------------------- Non-Vascular Findings: Mediastinum/Lymph Nodes: Redemonstrated prominent though non pathologically enlarged mediastinal and bilateral hilar lymph nodes, with index left hilar lymph node measuring 1.3 cm (image 71, series  4), index right hilar lymph node measuring 0.9 cm (image 74, series 4) and index subcarinal lymph node measuring 0.9 cm (image 70, series 4), similar to chest CT performed 05/03/2022, presumably reactive in etiology in the setting of advanced emphysematous change. No bulky mediastinal, hilar or axillary lymphadenopathy. Lungs/Pleura: Redemonstrated advanced apical predominant centrilobular and paraseptal emphysematous change. Previously biopsied somewhat spiculated 2.1 x 1.6 cm area of nodular consolidation within the right upper lobe (image 48, series 6) has increased in size compared to the 12/14/2022 examination, previously, 1.1 cm as well as PET-CT performed 06/07/2022, previously, 1.2 cm. No discrete focal airspace opacities. No pleural effusion or pneumothorax. The central pulmonary airways appear widely patent. Upper abdomen: Limited early arterial phase evaluation of the upper abdomen demonstrates diffuse decreased attenuation of the hepatic parenchyma with mild nodularity of the hepatic contour. No discrete hyperenhancing hepatic lesions. Moderate colonic stool burden without evidence of enteric obstruction. Musculoskeletal: No acute or aggressive osseous abnormalities. Moderate DDD of C6-C7 with disc space height loss, endplate irregularity and sclerosis. Regional soft tissues appear normal. Normal  appearance of the thyroid  gland. IMPRESSION: 1. Stable uncomplicated fusiform ectasia of the ascending thoracic aorta measuring up to 39 mm, unchanged compared to the 12/2016 examination. 2. Previously biopsied somewhat spiculated 2.1 cm area of nodular consolidation within the right upper lobe has increased in size compared to the 12/14/2022 examination, previously, 1.1 cm. Correlation with prior biopsy results is advised. Further evaluation with a repeat PET-CT could be performed as indicated. 3. Diffuse decreased attenuation of the hepatic parenchyma with mild nodularity of the hepatic contour as could be seen in the setting of early cirrhotic change. Further evaluation with LFTs could be performed as indicated. 4. Aortic Atherosclerosis (ICD10-I70.0) and Emphysema (ICD10-J43.9). Electronically Signed   By: Robbi Childs M.D.   On: 06/30/2023 17:25   No results found.    Assessment & Plan:   Encounter for general adult medical examination with abnormal findings- Exam completed, labs reviewed, vaccines reviewed, cancer screenings addressed, pt ed material was given.   Benign prostatic hyperplasia with post-void dribbling -     PSA; Future -     Urinalysis, Routine w reflex microscopic; Future  Essential hypertension, benign- His BP is adequately well controlled. EKG is negative for LVH. -     TSH; Future -     Urinalysis, Routine w reflex microscopic; Future -     Basic metabolic panel with GFR; Future -     CBC with Differential/Platelet; Future -     EKG 12-Lead -     Olmesartan  Medoxomil; Take 1 tablet (20 mg total) by mouth daily.  Dispense: 90 tablet; Refill: 1  Hyperlipidemia with target LDL less than 130 -     Lipid panel; Future -     TSH; Future -     Hepatic function panel; Future  Type II diabetes mellitus with manifestations (HCC)- Blood sugar is adequately well controlled. -     Urinalysis, Routine w reflex microscopic; Future -     Hemoglobin A1c; Future -     Microalbumin  / creatinine urine ratio; Future -     metFORMIN  HCl ER; Take 2 tablets (1,500 mg total) by mouth daily with breakfast.  Dispense: 180 tablet; Refill: 1 -     Olmesartan  Medoxomil; Take 1 tablet (20 mg total) by mouth daily.  Dispense: 90 tablet; Refill: 1  Statin myopathy  INTERSTITIAL LUNG DISEASE -     DG Chest 2 View; Future -  Pulmonary Visit  EMPHYSEMA -     DG Chest 2 View; Future -     Pulmonary Visit  Subacute cough -     DG Chest 2 View; Future  LRTI (lower respiratory tract infection) -     Amoxicillin -Pot Clavulanate; Take 1 tablet by mouth 2 (two) times daily for 7 days.  Dispense: 14 tablet; Refill: 0     Follow-up: Return in about 3 months (around 05/13/2024).  Sandra Crouch, MD

## 2024-02-12 NOTE — Patient Instructions (Signed)
 Health Maintenance, Male  Adopting a healthy lifestyle and getting preventive care are important in promoting health and wellness. Ask your health care provider about:  The right schedule for you to have regular tests and exams.  Things you can do on your own to prevent diseases and keep yourself healthy.  What should I know about diet, weight, and exercise?  Eat a healthy diet    Eat a diet that includes plenty of vegetables, fruits, low-fat dairy products, and lean protein.  Do not eat a lot of foods that are high in solid fats, added sugars, or sodium.  Maintain a healthy weight  Body mass index (BMI) is a measurement that can be used to identify possible weight problems. It estimates body fat based on height and weight. Your health care provider can help determine your BMI and help you achieve or maintain a healthy weight.  Get regular exercise  Get regular exercise. This is one of the most important things you can do for your health. Most adults should:  Exercise for at least 150 minutes each week. The exercise should increase your heart rate and make you sweat (moderate-intensity exercise).  Do strengthening exercises at least twice a week. This is in addition to the moderate-intensity exercise.  Spend less time sitting. Even light physical activity can be beneficial.  Watch cholesterol and blood lipids  Have your blood tested for lipids and cholesterol at 70 years of age, then have this test every 5 years.  You may need to have your cholesterol levels checked more often if:  Your lipid or cholesterol levels are high.  You are older than 70 years of age.  You are at high risk for heart disease.  What should I know about cancer screening?  Many types of cancers can be detected early and may often be prevented. Depending on your health history and family history, you may need to have cancer screening at various ages. This may include screening for:  Colorectal cancer.  Prostate cancer.  Skin cancer.  Lung  cancer.  What should I know about heart disease, diabetes, and high blood pressure?  Blood pressure and heart disease  High blood pressure causes heart disease and increases the risk of stroke. This is more likely to develop in people who have high blood pressure readings or are overweight.  Talk with your health care provider about your target blood pressure readings.  Have your blood pressure checked:  Every 3-5 years if you are 9-95 years of age.  Every year if you are 85 years old or older.  If you are between the ages of 29 and 29 and are a current or former smoker, ask your health care provider if you should have a one-time screening for abdominal aortic aneurysm (AAA).  Diabetes  Have regular diabetes screenings. This checks your fasting blood sugar level. Have the screening done:  Once every three years after age 23 if you are at a normal weight and have a low risk for diabetes.  More often and at a younger age if you are overweight or have a high risk for diabetes.  What should I know about preventing infection?  Hepatitis B  If you have a higher risk for hepatitis B, you should be screened for this virus. Talk with your health care provider to find out if you are at risk for hepatitis B infection.  Hepatitis C  Blood testing is recommended for:  Everyone born from [birth year] through 1967.  Anyone  with known risk factors for hepatitis C.  Sexually transmitted infections (STIs)  You should be screened each year for STIs, including gonorrhea and chlamydia, if:  You are sexually active and are younger than 70 years of age.  You are older than 70 years of age and your health care provider tells you that you are at risk for this type of infection.  Your sexual activity has changed since you were last screened, and you are at increased risk for chlamydia or gonorrhea. Ask your health care provider if you are at risk.  Ask your health care provider about whether you are at high risk for HIV. Your health care provider  may recommend a prescription medicine to help prevent HIV infection. If you choose to take medicine to prevent HIV, you should first get tested for HIV. You should then be tested every 3 months for as long as you are taking the medicine.  Follow these instructions at home:  Alcohol use  Do not drink alcohol if your health care provider tells you not to drink.  If you drink alcohol:  Limit how much you have to 0-2 drinks a day.  Know how much alcohol is in your drink. In the [place] one drink equals one 12 oz bottle of beer (355 mL), one 5 oz glass of wine (148 mL), or one 1 oz glass of hard liquor (44 mL).  Lifestyle  Do not use any products that contain nicotine or tobacco. These products include cigarettes, chewing tobacco, and vaping devices, such as e-cigarettes. If you need help quitting, ask your health care provider.  Do not use street drugs.  Do not share needles.  Ask your health care provider for help if you need support or information about quitting drugs.  General instructions  Schedule regular health, dental, and eye exams.  Stay current with your vaccines.  Tell your health care provider if:  You often feel depressed.  You have ever been abused or do not feel safe at home.  Summary  Adopting a healthy lifestyle and getting preventive care are important in promoting health and wellness.  Follow your health care provider's instructions about healthy diet, exercising, and getting tested or screened for diseases.  Follow your health care provider's instructions on monitoring your cholesterol and blood pressure.  This information is not intended to replace advice given to you by your health care provider. Make sure you discuss any questions you have with your health care provider.  Document Revised: 02/21/2021 Document Reviewed: 02/21/2021  Elsevier Patient Education  2024 [employer].

## 2024-02-29 ENCOUNTER — Telehealth: Payer: Self-pay | Admitting: Student

## 2024-02-29 NOTE — Telephone Encounter (Signed)
 Rc'd fax from [place] foor POC and more. Will fwd to Dr. Fernande Howells for signature.

## 2024-02-29 NOTE — Telephone Encounter (Signed)
 PT not seen in 1 year. Needs appt.

## 2024-03-05 ENCOUNTER — Other Ambulatory Visit: Payer: Self-pay | Admitting: Internal Medicine

## 2024-03-05 DIAGNOSIS — J9611 Chronic respiratory failure with hypoxia: Secondary | ICD-10-CM

## 2024-03-05 DIAGNOSIS — E118 Type 2 diabetes mellitus with unspecified complications: Secondary | ICD-10-CM

## 2024-03-05 DIAGNOSIS — J438 Other emphysema: Secondary | ICD-10-CM

## 2024-03-05 DIAGNOSIS — J8410 Pulmonary fibrosis, unspecified: Secondary | ICD-10-CM

## 2024-05-13 ENCOUNTER — Encounter: Payer: Self-pay | Admitting: Internal Medicine

## 2024-05-13 ENCOUNTER — Ambulatory Visit: Payer: Self-pay | Admitting: Internal Medicine

## 2024-05-13 ENCOUNTER — Ambulatory Visit (INDEPENDENT_AMBULATORY_CARE_PROVIDER_SITE_OTHER): Admitting: Internal Medicine

## 2024-05-13 VITALS — BP 138/86 | HR 95 | Temp 97.7°F | Resp 16 | Ht 69.0 in | Wt 207.2 lb

## 2024-05-13 DIAGNOSIS — E118 Type 2 diabetes mellitus with unspecified complications: Secondary | ICD-10-CM | POA: Diagnosis not present

## 2024-05-13 DIAGNOSIS — E785 Hyperlipidemia, unspecified: Secondary | ICD-10-CM

## 2024-05-13 DIAGNOSIS — I10 Essential (primary) hypertension: Secondary | ICD-10-CM

## 2024-05-13 LAB — BASIC METABOLIC PANEL WITH GFR
BUN: 13 mg/dL (ref 6–23)
CO2: 22 meq/L (ref 19–32)
Calcium: 9.6 mg/dL (ref 8.4–10.5)
Chloride: 105 meq/L (ref 96–112)
Creatinine, Ser: 0.85 mg/dL (ref 0.40–1.50)
GFR: 88.11 mL/min (ref >60.00)
Glucose, Bld: 132 mg/dL — ABNORMAL HIGH (ref 70–99)
Potassium: 4.3 meq/L (ref 3.5–5.1)
Sodium: 139 meq/L (ref 135–145)

## 2024-05-13 LAB — HEMOGLOBIN A1C: Hgb A1c MFr Bld: 7.5 % — ABNORMAL HIGH (ref 4.6–6.5)

## 2024-05-13 MED ORDER — EZETIMIBE 10 MG PO TABS: 10.0000 mg | ORAL_TABLET | Freq: Every day | ORAL | 1 refills | Status: DC

## 2024-05-13 NOTE — Patient Instructions (Signed)

## 2024-05-13 NOTE — Progress Notes (Signed)
 Subjective:  Patient ID: [name], male    DOB: 09-22-1956  Age: 68 y.o. MRN: [id number]  CC: Hypertension, Hyperlipidemia, and Diabetes   HPI MORDCHE HEDGLIN presents for f/up -----  Discussed the use of AI scribe software for clinical note transcription with the patient, who gave verbal consent to proceed.  History of Present Illness [name] is a 68 year old male who presents with increased use of his inhaler due to heat and humidity.  He has been using his inhaler more frequently due to the heat and humidity. He needs to 'cut the oxygen ' and rest after working outside. No chest pain, coughing, or wheezing, although he occasionally coughs up clear phlegm in the mornings.  He experiences arthritis in his right shoulder, which flares up intermittently, especially when he sleeps on it. He has not had any injections or surgery on this shoulder. He takes [facility] powders for pain relief and occasionally uses Advil.  He reports minimal heartburn, which is dependent on his diet. No trouble swallowing or painful swallowing.  He wears diabetic socks to prevent circulation issues, as other socks tend to cut off circulation and cause swelling.  His pharmacy requested a refill for a generic version of Zetia , which he is unsure if he received. He also notes that he ran out of another medication before his last visit and has been doing fine without it.    Outpatient Medications Prior to Visit  Medication Sig Dispense Refill   aspirin 81 MG EC tablet Take 81 mg by mouth at bedtime.     Blood Glucose Monitoring Suppl (ONETOUCH VERIO) w/Device KIT 1 Act by Does not apply route in the morning and at bedtime. 1 kit 2   Carboxymethylcellul-Glycerin (LUBRICATING EYE DROPS OP) Place 1 drop into both eyes daily as needed (dry eyes).     [employer] D3 50 MCG (2000 UT) CAPS TAKE 2 CAPSULES (4,000 UNITS TOTAL) BY MOUTH DAILY. 180 capsule 1   fexofenadine (ALLEGRA) 180 MG tablet Take 180 mg by mouth daily.      glucose blood (ONETOUCH VERIO) test strip 1 each by Other route in the morning and at bedtime. Use as instructed 200 each 3   Lancets (ONETOUCH ULTRASOFT) lancets      metFORMIN  (GLUCOPHAGE -XR) 750 MG 24 hr tablet Take 2 tablets (1,500 mg total) by mouth daily with breakfast. 180 tablet 1   NON FORMULARY Oxygen  continuous 1.5 liters with activity     olmesartan  (BENICAR ) 20 MG tablet Take 1 tablet (20 mg total) by mouth daily. 90 tablet 1   ONETOUCH ULTRA test strip USE TO TEST UP TO 4 TIMES DAILY AS DIRECTED 100 strip 5   ONETOUCH VERIO test strip USE 1 PER DAY 100 each 2   sodium chloride (OCEAN) 0.65 % SOLN nasal spray Place 1 spray into both nostrils as needed for congestion.     Tiotropium Bromide -Olodaterol (STIOLTO RESPIMAT ) 2.5-2.5 MCG/ACT AERS INHALE 2 PUFFS BY MOUTH INTO THE LUNGS DAILY 12 g 1   famotidine  (PEPCID ) 40 MG tablet TAKE 1 TABLET BY MOUTH EVERY DAY (Patient not taking: Reported on 05/13/2024) 90 tablet 1   ezetimibe  (ZETIA ) 10 MG tablet TAKE 1 TABLET BY MOUTH EVERY DAY (Patient not taking: Reported on 05/13/2024) 90 tablet 1   KRILL OIL PO Take 400 mg by mouth daily. (Patient not taking: Reported on 05/13/2024)     No facility-administered medications prior to visit.    ROS Review of Systems  Objective:  BP 138/86 (BP Location: Left Arm, Patient Position: Sitting, Cuff Size: Normal)   Pulse 95   Temp 97.7 F (36.5 C) (Oral)   Resp 16   Ht 5' 9 (1.753 m)   Wt 207 lb 3.2 oz (94 kg)   SpO2 96%   BMI 30.60 kg/m   BP Readings from Last 3 Encounters:  05/13/24 138/86  02/12/24 (!) 142/86  08/13/23 130/88    Wt Readings from Last 3 Encounters:  05/13/24 207 lb 3.2 oz (94 kg)  02/12/24 205 lb 9.6 oz (93.3 kg)  12/31/23 210 lb (95.3 kg)    Physical Exam Pulmonary:     Breath sounds: Normal breath sounds. No decreased breath sounds, wheezing, rhonchi or rales.     Lab Results  Component Value Date   WBC 9.8 02/12/2024   HGB 14.3 02/12/2024   HCT 42.6  02/12/2024   PLT 318.0 02/12/2024   GLUCOSE 132 (H) 05/13/2024   CHOL 172 02/12/2024   TRIG 210.0 (H) 02/12/2024   HDL 35.60 (L) 02/12/2024   LDLDIRECT 117.0 04/26/2022   LDLCALC 95 02/12/2024   ALT 26 02/12/2024   AST 22 02/12/2024   NA 139 05/13/2024   K 4.3 05/13/2024   CL 105 05/13/2024   CREATININE 0.85 05/13/2024   BUN 13 05/13/2024   CO2 22 05/13/2024   TSH 2.08 02/12/2024   PSA 0.53 02/12/2024   INR 1.2 08/04/2008   HGBA1C 7.5 (H) 05/13/2024   MICROALBUR <0.7 02/12/2024    CT ANGIO CHEST AORTA W/CM & OR WO/CM Result Date: 06/30/2023 CLINICAL DATA:  Follow-up abdominal aortic aneurysm. Former smoker. History of diabetes and COPD. History of prior lung nodule biopsy. EXAM: CT ANGIOGRAPHY CHEST WITH CONTRAST TECHNIQUE: Multidetector CT imaging of the chest was performed using the standard protocol during bolus administration of intravenous contrast. Multiplanar CT image reconstructions and MIPs were obtained to evaluate the vascular anatomy. RADIATION DOSE REDUCTION: This exam was performed according to the departmental dose-optimization program which includes automated exposure control, adjustment of the mA and/or kV according to patient size and/or use of iterative reconstruction technique. CONTRAST:  75mL OMNIPAQUE  IOHEXOL  350 MG/ML SOLN COMPARISON:  Chest CT-12/14/2022; 05/03/2022; 12/19/2016; PET-CT-06/07/2022 FINDINGS: Vascular Findings: Stable fusiform ectasia dilatation of the ascending thoracic aorta with measurements as follows. There is a moderate-to-large amount of slightly irregular mixed calcified and noncalcified atherosclerotic plaque involving the aortic arch and descending thoracic aorta, not resulting in a hemodynamically significant stenosis. No evidence of abdominal aortic dissection or perivascular stranding on this nongated examination. Conventional configuration of the aortic arch. The branch vessels of the aortic arch are diseased at their origin though without  hemodynamically significant narrowing throughout their imaged courses. The descending thoracic aorta is diseased though without hemodynamically significant narrowing and of normal caliber. Normal heart size. Suspected coronary artery calcifications. No pericardial effusion. Although this examination was not tailored for the evaluation the pulmonary arteries, there are no discrete filling defects within the central pulmonary arterial tree to suggest central pulmonary embolism. Normal caliber of the main pulmonary artery. ------------------------------------------------------------- Thoracic aortic measurements: SINOTUBULAR JUNCTION: 36 mm as measured in greatest oblique short axis coronal dimension. PROXIMAL ASCENDING THORACIC AORTA: 39 mm as measured in greatest oblique short axis axial dimension (axial image 69, series 4) at the level of the main pulmonary artery and approximately 39 mm as measured in greatest oblique short axis coronal dimension (coronal image 99, series 7), unchanged compared to the 12/2016 examination by my direct remeasurement. AORTIC ARCH: 32 mm  as measured in greatest oblique short axis sagittal dimension. PROXIMAL DESCENDING THORACIC AORTA: 30 mm as measured in greatest oblique short axis axial dimension at the level of the main pulmonary artery. DISTAL DESCENDING THORACIC AORTA: 20 mm as measured in greatest oblique short axis axial dimension at the level of the diaphragmatic hiatus. Review of the MIP images confirms the above findings. ------------------------------------------------------------- Non-Vascular Findings: Mediastinum/Lymph Nodes: Redemonstrated prominent though non pathologically enlarged mediastinal and bilateral hilar lymph nodes, with index left hilar lymph node measuring 1.3 cm (image 71, series 4), index right hilar lymph node measuring 0.9 cm (image 74, series 4) and index subcarinal lymph node measuring 0.9 cm (image 70, series 4), similar to chest CT performed  05/03/2022, presumably reactive in etiology in the setting of advanced emphysematous change. No bulky mediastinal, hilar or axillary lymphadenopathy. Lungs/Pleura: Redemonstrated advanced apical predominant centrilobular and paraseptal emphysematous change. Previously biopsied somewhat spiculated 2.1 x 1.6 cm area of nodular consolidation within the right upper lobe (image 48, series 6) has increased in size compared to the 12/14/2022 examination, previously, 1.1 cm as well as PET-CT performed 06/07/2022, previously, 1.2 cm. No discrete focal airspace opacities. No pleural effusion or pneumothorax. The central pulmonary airways appear widely patent. Upper abdomen: Limited early arterial phase evaluation of the upper abdomen demonstrates diffuse decreased attenuation of the hepatic parenchyma with mild nodularity of the hepatic contour. No discrete hyperenhancing hepatic lesions. Moderate colonic stool burden without evidence of enteric obstruction. Musculoskeletal: No acute or aggressive osseous abnormalities. Moderate DDD of C6-C7 with disc space height loss, endplate irregularity and sclerosis. Regional soft tissues appear normal. Normal appearance of the thyroid  gland. IMPRESSION: 1. Stable uncomplicated fusiform ectasia of the ascending thoracic aorta measuring up to 39 mm, unchanged compared to the 12/2016 examination. 2. Previously biopsied somewhat spiculated 2.1 cm area of nodular consolidation within the right upper lobe has increased in size compared to the 12/14/2022 examination, previously, 1.1 cm. Correlation with prior biopsy results is advised. Further evaluation with a repeat PET-CT could be performed as indicated. 3. Diffuse decreased attenuation of the hepatic parenchyma with mild nodularity of the hepatic contour as could be seen in the setting of early cirrhotic change. Further evaluation with LFTs could be performed as indicated. 4. Aortic Atherosclerosis (ICD10-I70.0) and Emphysema  (ICD10-J43.9). Electronically Signed   By: Norleen Roulette M.D.   On: 06/30/2023 17:25    Assessment & Plan:  Type II diabetes mellitus with manifestations (HCC) -     HM Diabetes Foot Exam -     Basic metabolic panel with GFR; Future -     Hemoglobin A1c; Future  Hyperlipidemia with target LDL less than 130 -     Ezetimibe ; Take 1 tablet (10 mg total) by mouth daily.  Dispense: 90 tablet; Refill: 1  Essential hypertension, benign -     Basic metabolic panel with GFR; Future     Follow-up: Return in about 6 months (around 11/13/2024).  Debby Molt, MD

## 2024-05-27 NOTE — Progress Notes (Signed)
 [name] [address] [place] [address] Phone: [phone]   Assessment and Plan:     1. Chronic right shoulder pain 2. Subacromial bursitis of right shoulder joint  -Chronic with exacerbation, subsequent visit - Most consistent with subacromial bursitis of right shoulder due to side sleeping.  Patient does have underlying mild glenohumeral osteoarthritis, though HPI and physical exam more consistent with subacromial bursitis as primary pain generator - Use Tylenol  500 to 1000 mg tablets 2-3 times a day for day-to-day pain relief - Start HEP for rotator cuff - X-ray obtained in clinic.  My interpretation: No acute fracture or dislocation.  Mild inferior glenohumeral degenerative changes and spurring - Patient elected for subacromial CSI.  Tolerated well per note below.  CSI may temporarily increase blood glucose in patient with past medical history of DM type II  Procedure: Subacromial Injection Side: Right  Risks explained and consent was given verbally. The site was cleaned with alcohol prep. A steroid injection was performed from posterior approach using 2mL of 1% lidocaine  without epinephrine and 1mL of kenalog 40mg /ml. This was well tolerated.  Needle was removed, hemostasis achieved, and post injection instructions were explained.   Pt was advised to call or return to clinic if these symptoms worsen or fail to improve as anticipated.   15 additional minutes spent for educating Therapeutic Home Exercise Program.  This included exercises focusing on stretching, strengthening, with focus on eccentric aspects.   Long term goals include an improvement in range of motion, strength, endurance as well as avoiding reinjury. Patient's frequency would include in 1-2 times a day, 3-5 times a week for a duration of 6-12 weeks. Proper technique shown and discussed handout in great detail with ATC.  All questions were discussed and answered.     Pertinent previous records reviewed include none  Follow Up: 4 weeks for reevaluation.  If no improvement or worsening of symptoms, could consider NSAID course versus physical therapy versus intra-articular CSI   Subjective:   I, [name], am serving as a Neurosurgeon for Doctor [name]  Chief Complaint: right shoulder pain   HPI:   05/28/2024 Patient is a 68 year old male with right shoulder pain. Patient states pain started 6-7 years ago. Pain has increased over the last 2-3 months . Hx of arthritis. Pain when sleeping on his side.  Pain is worse in the AM. Decreased ROM. Arthritis meds and salonpase help. Pain does radiate up to the neck.   Relevant Historical Information: Hypertension, DM type II  Additional pertinent review of systems negative.   Current Outpatient Medications:    aspirin 81 MG EC tablet, Take 81 mg by mouth at bedtime., Disp: , Rfl:    Blood Glucose Monitoring Suppl (ONETOUCH VERIO) w/Device KIT, 1 Act by Does not apply route in the morning and at bedtime., Disp: 1 kit, Rfl: 2   Carboxymethylcellul-Glycerin (LUBRICATING EYE DROPS OP), Place 1 drop into both eyes daily as needed (dry eyes)., Disp: , Rfl:    CVS D3 50 MCG (2000 UT) CAPS, TAKE 2 CAPSULES (4,000 UNITS TOTAL) BY MOUTH DAILY., Disp: 180 capsule, Rfl: 1   ezetimibe  (ZETIA ) 10 MG tablet, Take 1 tablet (10 mg total) by mouth daily., Disp: 90 tablet, Rfl: 1   famotidine  (PEPCID ) 40 MG tablet, TAKE 1 TABLET BY MOUTH EVERY DAY (Patient not taking: Reported on 05/13/2024), Disp: 90 tablet, Rfl: 1   fexofenadine (ALLEGRA) 180 MG tablet, Take 180  mg by mouth daily., Disp: , Rfl:    glucose blood (ONETOUCH VERIO) test strip, 1 each by Other route in the morning and at bedtime. Use as instructed, Disp: 200 each, Rfl: 3   Lancets (ONETOUCH ULTRASOFT) lancets, , Disp: , Rfl:    metFORMIN  (GLUCOPHAGE -XR) 750 MG 24 hr tablet, Take 2 tablets (1,500 mg total) by mouth daily with breakfast., Disp: 180  tablet, Rfl: 1   NON FORMULARY, Oxygen  continuous 1.5 liters with activity, Disp: , Rfl:    olmesartan  (BENICAR ) 20 MG tablet, Take 1 tablet (20 mg total) by mouth daily., Disp: 90 tablet, Rfl: 1   ONETOUCH ULTRA test strip, USE TO TEST UP TO 4 TIMES DAILY AS DIRECTED, Disp: 100 strip, Rfl: 5   ONETOUCH VERIO test strip, USE 1 PER DAY, Disp: 100 each, Rfl: 2   sodium chloride (OCEAN) 0.65 % SOLN nasal spray, Place 1 spray into both nostrils as needed for congestion., Disp: , Rfl:    Tiotropium Bromide -Olodaterol (STIOLTO RESPIMAT ) 2.5-2.5 MCG/ACT AERS, INHALE 2 PUFFS BY MOUTH INTO THE LUNGS DAILY, Disp: 12 g, Rfl: 1   Objective:     Vitals:   05/28/24 0858  Pulse: 91  SpO2: 98%  Weight: 203 lb (92.1 kg)  Height: 5' 9 (1.753 m)      Body mass index is 29.98 kg/m.    Physical Exam:    Gen: Appears well, nad, nontoxic and pleasant Neuro:sensation intact, strength is 5/5 with df/pf/inv/ev, muscle tone wnl Skin: no suspicious lesion or defmority Psych: A&O, appropriate mood and affect  Right shoulder:  No deformity, swelling or muscle wasting No scapular winging FF 180, abd 180, int 0, ext 90 NTTP over the [place]ben, clavicle, ac, coracoid, biceps groove, humerus, deltoid, trapezius, cervical spine Positive Neer, Hawkins, empty can Neg  obriens, crossarm, subscap liftoff, speeds Neg ant drawer, sulcus sign, apprehension Negative Spurling's test bilat FROM of neck    Electronically signed by:  [name] D.[name] 9:17 AM 05/28/24

## 2024-05-28 ENCOUNTER — Encounter: Payer: Self-pay | Admitting: Internal Medicine

## 2024-05-28 ENCOUNTER — Ambulatory Visit (INDEPENDENT_AMBULATORY_CARE_PROVIDER_SITE_OTHER)

## 2024-05-28 ENCOUNTER — Ambulatory Visit: Admitting: Sports Medicine

## 2024-05-28 ENCOUNTER — Telehealth: Payer: Self-pay

## 2024-05-28 VITALS — HR 91 | Ht 69.0 in | Wt 203.0 lb

## 2024-05-28 DIAGNOSIS — M7551 Bursitis of right shoulder: Secondary | ICD-10-CM | POA: Diagnosis not present

## 2024-05-28 DIAGNOSIS — M25511 Pain in right shoulder: Secondary | ICD-10-CM

## 2024-05-28 DIAGNOSIS — G8929 Other chronic pain: Secondary | ICD-10-CM | POA: Diagnosis not present

## 2024-05-28 NOTE — Patient Instructions (Signed)
 Shoulder HEP   4 week follow up

## 2024-05-28 NOTE — Telephone Encounter (Signed)
 Can you write him a letter do excuse him from his jury duty?

## 2024-05-28 NOTE — Telephone Encounter (Signed)
 Letter has been placed up front and the patient has been made aware via voice message on phone.

## 2024-05-28 NOTE — Telephone Encounter (Signed)
 The patient came in and brought Jury Duty paperwork for service on June 17, 2024. The patient he has problems with emphysema and also bathroom issues that would not allow him to perform jury duty.

## 2024-05-28 NOTE — Telephone Encounter (Signed)
 Letter written

## 2024-06-10 ENCOUNTER — Ambulatory Visit: Admitting: Pulmonary Disease

## 2024-06-10 ENCOUNTER — Encounter: Payer: Self-pay | Admitting: Pulmonary Disease

## 2024-06-10 VITALS — BP 144/80 | HR 88 | Temp 98.0°F | Ht 69.0 in | Wt 203.0 lb

## 2024-06-10 DIAGNOSIS — J438 Other emphysema: Secondary | ICD-10-CM | POA: Diagnosis not present

## 2024-06-10 DIAGNOSIS — I7121 Aneurysm of the ascending aorta, without rupture: Secondary | ICD-10-CM | POA: Diagnosis not present

## 2024-06-10 DIAGNOSIS — R911 Solitary pulmonary nodule: Secondary | ICD-10-CM | POA: Diagnosis not present

## 2024-06-10 NOTE — Patient Instructions (Addendum)
  VISIT SUMMARY: Today, you were seen for the evaluation of a lung nodule and follow-up on your chronic obstructive pulmonary disease (COPD) and emphysema. We discussed the history and changes in the size of your lung nodule, as well as your current management for COPD and emphysema. Additionally, we reviewed the status of your abdominal aortic aneurysm.  YOUR PLAN: -RIGHT LUNG PULMONARY NODULE: A pulmonary nodule is a small growth in the lung that can be benign or malignant. Your nodule has increased in size from 10 mm to 21 mm on 2024, which is concerning. We will order a CT to assess its current status and refer you to Dr. Shelah for further evaluation and biopsy if necessary.  -EMPHYSEMA: Emphysema is a lung condition that causes shortness of breath. You are currently managing it with an inhaler as needed, especially during physical activity. Continue to monitor your oxygen  levels and use supplemental oxygen  at home if required.  - ASCENDING AORTIC ANEURYSM UNDER SURVEILLANCE: An abdominal aortic aneurysm is an enlarged area in the lower part of the aorta. Your aneurysm has been stable at around 4.0 cm, and we will continue to monitor it. We will include an assessment of this aneurysm in your upcoming CT.  INSTRUCTIONS: Please schedule a CT angiogram to assess the current status of your lung nodule and ascending aortic aneurysm. Follow up with Dr. Shelah if the nodule is present and growing.

## 2024-06-10 NOTE — Progress Notes (Signed)
 YANNIS BROCE    [id number]    10-26-1955  Primary Care Physician:Jones, Debby CROME, MD  Referring Physician: Joshua Debby CROME, MD [address] [place],  [place] [address]  Chief complaint: Follow-up for lung nodule, emphysema  HPI: 68 y.o. who  has a past medical history of Aortic aneurysm (HCC), COPD (chronic obstructive pulmonary disease) (HCC) (09/23/2008), Depression, Diabetes mellitus without complication (HCC), Dyslipidemia, Dyslipidemia, Emphysema/COPD (HCC), GERD (gastroesophageal reflux disease), History of skin cancer, Hypertension, ILD (interstitial lung disease) (HCC), Panic attack, and Pneumonia.  Discussed the use of AI scribe software for clinical note transcription with the patient, who gave verbal consent to proceed.  History of Present Illness Maks Cavallero is a 68 year old male with COPD and emphysema who presents for evaluation of emphysema and lung nodule.  He is a previous patient of Dr. Brenna  Pulmonary nodule - Initial identification of right upper lobe nodule in 2023 measuring 15 mm on CT scan - No activity on PET scan at initial evaluation - Bronchoscopy attempted on June 13, 2022 by Dr. Brenna; nodule appeared resolved on imaging on the bronchoscopy table, biopsy not performed, lavage conducted instead with negative culture results - Imaging in February 2024 showed nodule decreased to 10 mm - By September 2024, nodule increased to 21 mm - Frustration regarding previous communications about diagnosis - Hesitancy to undergo further procedures without clear evidence of necessity  Emphysema - Diagnosed in early 2000s - Significant exacerbation in 2009 during H1N1 outbreak, resulting in hospitalization and prolonged oxygen  use - Treated with tapering course of steroids during exacerbation - Current use of inhaler as needed, especially with physical exertion - Regular monitoring of oxygen  saturation - Quit smoking around 2005 after  intermittent smoking periods  Relevant Pulmonary history: Pets: No pets 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  No feather pillow comforter No h/o chemo/XRT/amiodarone/macrodantin/MTX  No exposure to asbestos, silica or other organic allergens  Smoking history: 16-pack-year smoker.  Quit in 2005 Travel history: No significant travel history Family history: No family history of lung disease   Outpatient Encounter Medications as of 06/10/2024  Medication Sig   aspirin 81 MG EC tablet Take 81 mg by mouth at bedtime.   Blood Glucose Monitoring Suppl (ONETOUCH VERIO) w/Device KIT 1 Act by Does not apply route in the morning and at bedtime.   Carboxymethylcellul-Glycerin (LUBRICATING EYE DROPS OP) Place 1 drop into both eyes daily as needed (dry eyes).   [employer]S D3 50 MCG (2000 UT) CAPS TAKE 2 CAPSULES (4,000 UNITS TOTAL) BY MOUTH DAILY.   ezetimibe  (ZETIA ) 10 MG tablet Take 1 tablet (10 mg total) by mouth daily.   fexofenadine (ALLEGRA) 180 MG tablet Take 180 mg by mouth daily.   glucose blood (ONETOUCH VERIO) test strip 1 each by Other route in the morning and at bedtime. Use as instructed   Lancets (ONETOUCH ULTRASOFT) lancets    metFORMIN  (GLUCOPHAGE -XR) 750 MG 24 hr tablet Take 2 tablets (1,500 mg total) by mouth daily with breakfast.   NON FORMULARY Oxygen  continuous 1.5 liters with activity   olmesartan  (BENICAR ) 20 MG tablet Take 1 tablet (20 mg total) by mouth daily.   ONETOUCH ULTRA test strip USE TO TEST UP TO 4 TIMES DAILY AS DIRECTED   ONETOUCH VERIO test strip USE 1 PER DAY   sodium chloride (OCEAN) 0.65 % SOLN nasal spray Place 1  spray into both nostrils as needed for congestion.   Tiotropium Bromide -Olodaterol (STIOLTO RESPIMAT ) 2.5-2.5 MCG/ACT AERS INHALE 2 PUFFS BY MOUTH INTO THE LUNGS DAILY   famotidine  (PEPCID ) 40 MG tablet TAKE 1 TABLET BY MOUTH EVERY DAY (Patient not taking: Reported on 06/10/2024)   No  facility-administered encounter medications on file as of 06/10/2024.     Physical Exam: Today's Vitals   06/10/24 1011  BP: (!) 144/80  Pulse: 88  Temp: 98 F (36.7 C)  TempSrc: Oral  SpO2: 94%  Weight: 203 lb (92.1 kg)  Height: 5' 9 (1.753 m)   Body mass index is 29.98 kg/m.  Physical Exam GEN: No acute distress. CV: Regular rate and rhythm, no murmurs. LUNGS: Clear to auscultation bilaterally, normal respiratory effort. SKIN JOINTS: Warm and dry, no rash.    Data Reviewed: Imaging: CTA 05/03/2022-interval development of 15 mm right upper lobe nodule PET scan 06/07/2022-no abnormal PET uptake in lung nodule CT scan 12/14/2022-right upper lobe nodule measures 10 mm with central cavitation CTA 06/28/2023-increase in size of right upper lobe nodule to 2.1 cm. I have reviewed the images personally.  PFTs: 07/01/2018 FVC 4.58 [110%], FEV1 3.69 [119%], F/F80, TLC 6.13 [95%], DLCO 20.33 [71%] No obstruction or restriction, mild diffusion defect  Labs:  Assessment & Plan Right lung pulmonary nodule, increasing in size The right lung pulmonary nodule, initially identified in 2023 in the right upper lobe, measured 15 mm with no PET scan activity. A bronchoscopy in August 2023 showed resolution on imaging, leading to a lavage instead of a biopsy. The nodule measured 10 mm in February 2024 and increased to 21 mm by September 2024. The growth is concerning for potential inflammation or malignancy. He is hesitant to undergo another biopsy without clear evidence of the nodule's presence. I addressed all of his concerns in detail in office today - Order CT angiogram to assess the current status of the lung nodule and aortic aneurysm.   - Refer to Dr. Shelah for further evaluation if the nodule is present and growing.  Emphysema Diagnosed in 2018, he uses Stiolto inhaler as needed for oxygen  desaturation, particularly during physical activity. He has emphysema diagnosed in the early 2000s  and was on oxygen  therapy until about three years ago.  PFTs did not show obstruction.  He quit smoking around 2005 and monitors his oxygen  levels regularly, using supplemental oxygen  occasionally at home.  Thoracic aortic aneurysm under surveillance The aortic aneurysm has been under surveillance for 7-8 years, with the last measurement approximately 4.0 cm. Intervention is planned if it reaches 5.0 cm or more. - Include abdominal aortic aneurysm assessment in the CT angiogram.  Recommendations: CTA chest to evaluate lung nodule and aortic aneurysm Follow-up in nodule clinic Continue Stiolto, supplemental oxygen  as needed  Caryl Manas MD [facility] 06/10/2024, 11:05 AM  CC: Joshua Debby CROME, MD

## 2024-06-11 ENCOUNTER — Ambulatory Visit: Payer: Self-pay | Admitting: Sports Medicine

## 2024-06-11 DIAGNOSIS — R918 Other nonspecific abnormal finding of lung field: Secondary | ICD-10-CM

## 2024-06-17 ENCOUNTER — Other Ambulatory Visit

## 2024-06-24 ENCOUNTER — Ambulatory Visit
Admission: RE | Admit: 2024-06-24 | Discharge: 2024-06-24 | Disposition: A | Discharge status: Home / Self Care | Source: Ambulatory Visit | Attending: Pulmonary Disease | Admitting: Pulmonary Disease

## 2024-06-24 DIAGNOSIS — I7121 Aneurysm of the ascending aorta, without rupture: Secondary | ICD-10-CM

## 2024-06-24 DIAGNOSIS — J439 Emphysema, unspecified: Secondary | ICD-10-CM | POA: Diagnosis not present

## 2024-06-24 MED ORDER — IOPAMIDOL (ISOVUE-370) INJECTION 76%
75.0000 mL | Freq: Once | INTRAVENOUS | Status: AC | PRN
  Administered 2024-06-24: 75 mL via INTRAVENOUS

## 2024-06-24 NOTE — Progress Notes (Unsigned)
 [name] [address] [place] [address] Phone: [phone]   Assessment and Plan:     1. Chronic right shoulder pain (Primary) 2. Subacromial bursitis of right shoulder joint -Chronic with exacerbation, subsequent visit - Overall significant improvement in right shoulder pain after subacromial CSI at previous office visit on 05/28/2024 - Continue HEP for rotator cuff - Use Tylenol  500 to 1000 mg tablets 2-3 times a day for day-to-day pain relief   3. Opacity of lung on imaging study -Incidental finding - Patient had finding of persistent opacity right lung seen incidentally on right shoulder x-ray with recommendation for chest x-ray follow-up.  Patient has had a CT angio chest performed since that time that is currently awaiting official radiology review.  With CT scan performed, we do not need to repeat chest x-ray at this time -Reviewed x-ray with patient and identified opacity.  Patient has follow-up with pulmonology next month.  Recommend continuing follow-up with pulmonology to discuss next steps in workup and treatment  4. Greater trochanteric bursitis of left hip -Chronic with exacerbation, subsequent visit - Recurrence of left lateral hip pain most consistent with recurrent greater trochanteric bursitis - Patient elected for greater trochanteric CSI.  Tolerated well per note below - Start home exercises for gluteal musculature  Procedure: Greater trochanteric bursal injection Side: Left  Risks explained and consent was given verbally. The site was cleaned with alcohol prep. A steroid injection was performed with patient in the lateral side-lying position at area of maximum tenderness over greater trochanter using 2mL of 1% lidocaine  without epinephrine and 1mL of kenalog 40mg /ml. This was well tolerated.  Needle was removed, hemostasis achieved, and post injection instructions were explained.  Pt was advised to call or  return to clinic if these symptoms worsen or fail to improve as anticipated.   15 additional minutes spent for educating Therapeutic Home Exercise Program.  This included exercises focusing on stretching, strengthening, with focus on eccentric aspects.   Long term goals include an improvement in range of motion, strength, endurance as well as avoiding reinjury. Patient's frequency would include in 1-2 times a day, 3-5 times a week for a duration of 6-12 weeks. Proper technique shown and discussed handout in great detail with ATC.  All questions were discussed and answered.      Pertinent previous records reviewed include none   Follow Up: 6 to 8 weeks for reevaluation.  If no improvement or worsening of symptoms, could consider NSAID course versus physical therapy versus CSI   Subjective:   I, [name] am a scribe for Dr. Leonce.     Chief Complaint: right shoulder pain    HPI:    05/28/2024 Patient is a 68 year old male with right shoulder pain. Patient states pain started 6-7 years ago. Pain has increased over the last 2-3 months . Hx of arthritis. Pain when sleeping on his side.  Pain is worse in the AM. Decreased ROM. Arthritis meds and salonpase help. Pain does radiate up to the neck.   06/25/2024 Patient states that he is feeling pretty good today. Shoulder is a lot better. Left hip has been giving him some issues lately. Couple weeks ago it started and it hurt for a full week. Taking BC powder and goody powder for arthritis as needed.    Relevant Historical Information: Hypertension, DM type II  Additional pertinent review of systems negative.   Current Outpatient Medications:    aspirin  81 MG EC tablet, Take 81 mg by mouth at bedtime., Disp: , Rfl:    Blood Glucose Monitoring Suppl (ONETOUCH VERIO) w/Device KIT, 1 Act by Does not apply route in the morning and at bedtime., Disp: 1 kit, Rfl: 2   Carboxymethylcellul-Glycerin (LUBRICATING EYE DROPS OP), Place 1 drop into  both eyes daily as needed (dry eyes)., Disp: , Rfl:    CVS D3 50 MCG (2000 UT) CAPS, TAKE 2 CAPSULES (4,000 UNITS TOTAL) BY MOUTH DAILY., Disp: 180 capsule, Rfl: 1   ezetimibe  (ZETIA ) 10 MG tablet, Take 1 tablet (10 mg total) by mouth daily., Disp: 90 tablet, Rfl: 1   famotidine  (PEPCID ) 40 MG tablet, TAKE 1 TABLET BY MOUTH EVERY DAY, Disp: 90 tablet, Rfl: 1   fexofenadine (ALLEGRA) 180 MG tablet, Take 180 mg by mouth daily., Disp: , Rfl:    glucose blood (ONETOUCH VERIO) test strip, 1 each by Other route in the morning and at bedtime. Use as instructed, Disp: 200 each, Rfl: 3   Lancets (ONETOUCH ULTRASOFT) lancets, , Disp: , Rfl:    metFORMIN  (GLUCOPHAGE -XR) 750 MG 24 hr tablet, Take 2 tablets (1,500 mg total) by mouth daily with breakfast., Disp: 180 tablet, Rfl: 1   NON FORMULARY, Oxygen  continuous 1.5 liters with activity, Disp: , Rfl:    olmesartan  (BENICAR ) 20 MG tablet, Take 1 tablet (20 mg total) by mouth daily., Disp: 90 tablet, Rfl: 1   ONETOUCH ULTRA test strip, USE TO TEST UP TO 4 TIMES DAILY AS DIRECTED, Disp: 100 strip, Rfl: 5   ONETOUCH VERIO test strip, USE 1 PER DAY, Disp: 100 each, Rfl: 2   sodium chloride (OCEAN) 0.65 % SOLN nasal spray, Place 1 spray into both nostrils as needed for congestion., Disp: , Rfl:    Tiotropium Bromide -Olodaterol (STIOLTO RESPIMAT ) 2.5-2.5 MCG/ACT AERS, INHALE 2 PUFFS BY MOUTH INTO THE LUNGS DAILY, Disp: 12 g, Rfl: 1   Objective:     Vitals:   06/25/24 0903  BP: (!) 140/70  Pulse: 88  SpO2: 97%  Weight: 205 lb (93 kg)  Height: 5' 9 (1.753 m)      Body mass index is 30.27 kg/m.    Physical Exam:    General: awake, alert, and oriented no acute distress, nontoxic Skin: no suspicious lesions or rashes Neuro:sensation intact distally with no deficits, normal muscle tone, no atrophy, strength 5/5 in all tested lower ext groups Psych: normal mood and affect, speech clear   Left hip: No deformity, swelling or wasting ROM Flexion 90, ext  30, IR 45, ER 45 TTP greater trochanter NTTP over the hip flexors,  , gluteal musculature, si joint, lumbar spine Negative log roll with FROM  Gait normal    Electronically signed by:  [name] 9:27 AM 06/25/24

## 2024-06-25 ENCOUNTER — Ambulatory Visit

## 2024-06-25 ENCOUNTER — Ambulatory Visit: Admitting: Sports Medicine

## 2024-06-25 VITALS — BP 140/70 | HR 88 | Ht 69.0 in | Wt 205.0 lb

## 2024-06-25 DIAGNOSIS — R918 Other nonspecific abnormal finding of lung field: Secondary | ICD-10-CM

## 2024-06-25 DIAGNOSIS — Z23 Encounter for immunization: Secondary | ICD-10-CM | POA: Diagnosis not present

## 2024-06-25 DIAGNOSIS — M25511 Pain in right shoulder: Secondary | ICD-10-CM

## 2024-06-25 DIAGNOSIS — M7551 Bursitis of right shoulder: Secondary | ICD-10-CM | POA: Diagnosis not present

## 2024-06-25 DIAGNOSIS — M7062 Trochanteric bursitis, left hip: Secondary | ICD-10-CM | POA: Diagnosis not present

## 2024-06-25 DIAGNOSIS — G8929 Other chronic pain: Secondary | ICD-10-CM | POA: Diagnosis not present

## 2024-06-25 NOTE — Progress Notes (Signed)
 After obtaining consent, and per orders of Dr. Yetta Barre, injection of HDF given by Ferdie Ping. Patient instructed to report any adverse reaction to me immediately.

## 2024-06-25 NOTE — Patient Instructions (Signed)
 Glute Hep. Follow up in 6 to 8 weeks.

## 2024-07-04 ENCOUNTER — Ambulatory Visit: Payer: Self-pay | Admitting: Pulmonary Disease

## 2024-07-23 ENCOUNTER — Encounter: Payer: Self-pay | Admitting: Emergency Medicine

## 2024-07-23 ENCOUNTER — Telehealth: Payer: Self-pay | Admitting: Emergency Medicine

## 2024-07-23 ENCOUNTER — Ambulatory Visit: Admitting: Emergency Medicine

## 2024-07-23 VITALS — BP 148/92 | HR 88 | Ht 69.0 in | Wt 202.6 lb

## 2024-07-23 DIAGNOSIS — J4489 Other specified chronic obstructive pulmonary disease: Secondary | ICD-10-CM

## 2024-07-23 DIAGNOSIS — R911 Solitary pulmonary nodule: Secondary | ICD-10-CM

## 2024-07-23 NOTE — Progress Notes (Signed)
 Subjective:    Patient ID: [name], male    DOB: [birth year], 68 y.o.   MRN: [id number]  HPI Discussed the use of AI scribe software for clinical note transcription with the patient, who gave verbal consent to proceed.  History of Present Illness [name] is a 68 year old male with COPD who presents for evaluation of a right upper lobe pulmonary nodule. He is accompanied by his brother. He was referred by Dr. Darel for evaluation of the pulmonary nodule.  He has a history of COPD with emphysema and interstitial changes noted on CT scans. Previously, he was on oxygen  therapy until about four years ago and now uses a Stiolto inhaler as needed, although it is typically prescribed for daily use. He also carries a pulse oximeter with him. He remains active, engaging in activities such as mowing the yard, gardening, Administrator work, and shooting.  In 2023, a right upper lobe pulmonary nodule was identified, which showed subtle PET activity. A navigational bronchoscopy was attempted on June 13, 2022, but the nodule was poorly visualized and partially resolved, so biopsies were not performed. A bronchoalveolar lavage (BAL) was done with negative cultures. Follow-up imaging in February 2024 showed the nodule decreased to 10 mm, but by September 2024, it increased to 21 mm. The most recent scan on June 24, 2024, showed further enlargement to 1.7 x 6.1 x 2.9 cm with air bronchograms present.  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 He also has a family history of lung cancer, with two sisters and a brother affected.  He quit smoking in the early 2000s after being diagnosed with COPD. He was on a ventilator for ten days in 2009 due to pneumonia and carried an oxygen  tank until about four years  ago.    Results All images reviewed and interpreted by me, including CT scans of the chest from 06/2022, 06/2023, 06/24/2024.  Also interpreted prior PET scan.  RADIOLOGY CT Chest: Right upper lobe nodule increased in size to 21 mm (06/2023) CT Chest: Multifocal prominent but not pathologically enlarged right hilum, mediastinal nodes, emphysematous change with interstitial prominence, interval enlargement of peripheral right upper lobe opacity measuring 1.7 x 6.1 x 2.9 cm with air bronchograms (06/24/2024)  DIAGNOSTIC Bronchoscopy: Nodule poorly visualized, partially resolved; BAL with negative cultures (06/13/2022)    Review of Systems As per HPI  Past Medical History:  Diagnosis Date   Aortic aneurysm    COPD (chronic obstructive pulmonary disease) (HCC) 09/23/2008   -PFT 09/23/08 FEV1 2.89(84%), FVC 3.70 (78%), TLC 5.28 (78%), DLCO 64% mixed obstructive and  restrictive pattern.  Spirometry 03/11/09 FEV 3.35 (97%),FVC 4.10 (86%), FEV% 82. Steroid responsive pneumonitis 04/09- required mechanical ventilation during hospitalization - The following hospital test were negative: PCP,ANA,HIV,BAL. - ACE25,RF20,CRP 28,A1AT 263, ESR 109   Depression    Diabetes mellitus without complication (HCC)    Dyslipidemia    Dyslipidemia    Emphysema/COPD    GERD (gastroesophageal reflux disease)    History of skin cancer    Hypertension    Echo 10/09 EF 60%   ILD (interstitial lung disease) (HCC)    Panic attack    Pneumonia    2009 put on a vent    Family History  Problem Relation Age of Onset  Heart attack Father 68   Diabetes Father    Heart attack Brother    Emphysema Brother    Diabetes Brother      Social History   Socioeconomic History   Marital status: Divorced    Spouse name: Not on file   Number of children: 0   Years of education: 10th grade returned for GED   Highest education level: GED or equivalent  Occupational History   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    Social Drivers of Health   Financial Resource Strain: Low Risk  (12/31/2023)   Overall Financial Resource Strain (CARDIA)    Difficulty of Paying Living Expenses: Not hard at all  Food Insecurity: No Food Insecurity (12/31/2023)   Hunger Vital Sign    Worried About Running Out of Food in the Last Year: Never true    Ran Out of Food in the Last Year: Never true  Transportation Needs: No Transportation Needs (12/31/2023)   PRAPARE - Administrator, Civil Service (Medical): No    Lack of Transportation (Non-Medical): No  Physical Activity: Inactive (12/31/2023)   Exercise Vital Sign    Days of Exercise per Week: 0 days    Minutes of Exercise per Session: 0 min  Stress: No Stress Concern Present (12/31/2023)   [employer] of Occupational Health - Occupational Stress Questionnaire    Feeling of Stress : Not at all  Social Connections: Socially Isolated (12/31/2023)   Social Connection and Isolation Panel    Frequency of Communication with Friends and Family: More than three times a  week    Frequency of Social Gatherings with Friends and Family: Once a week    Attends Religious Services: Never    Database administrator or Organizations: No    Attends Banker Meetings: Never    Marital Status: Divorced  Catering manager Violence: Patient Unable To Answer (12/31/2023)   Humiliation, Afraid, Rape, and Kick questionnaire    Fear of Current or Ex-Partner: Patient unable to answer    Emotionally Abused: Patient unable to answer    Physically Abused: Patient unable to answer    Sexually Abused: Patient unable to answer    Allergies  Allergen Reactions   Morphine Itching and Nausea And Vomiting   Statins     weakness, muscle pains   Latex Rash  Current Outpatient Medications on File Prior to Visit  Medication Sig Dispense Refill   aspirin 81 MG EC tablet Take 81 mg by mouth at bedtime.     Blood Glucose Monitoring Suppl (ONETOUCH VERIO) w/Device KIT 1 Act by Does not apply route in the morning and at bedtime. 1 kit 2   CVS D3 50 MCG (2000 UT) CAPS TAKE 2 CAPSULES (4,000 UNITS TOTAL) BY MOUTH DAILY. 180 capsule 1   ezetimibe  (ZETIA ) 10 MG tablet Take 1 tablet (10 mg total) by mouth daily. 90 tablet 1   fexofenadine (ALLEGRA) 180 MG tablet Take 180 mg by mouth daily.     glucose blood (ONETOUCH VERIO) test strip 1 each by Other route in the morning and at bedtime. Use as instructed 200 each 3   Lancets (ONETOUCH ULTRASOFT) lancets      metFORMIN  (GLUCOPHAGE -XR) 750 MG 24 hr tablet Take 2 tablets (1,500 mg total) by mouth daily with breakfast. 180 tablet 1   Misc Natural Products (BEET ROOT PO) Take by mouth.     NON FORMULARY Oxygen  continuous 1.5 liters with activity     olmesartan  (BENICAR ) 20 MG tablet Take 1 tablet (20 mg total) by mouth daily. 90 tablet 1   sodium chloride (OCEAN) 0.65 % SOLN nasal spray Place 1 spray into both nostrils as needed for congestion.     Tiotropium Bromide -Olodaterol (STIOLTO RESPIMAT ) 2.5-2.5 MCG/ACT AERS INHALE 2 PUFFS  BY MOUTH INTO THE LUNGS DAILY 12 g 1   Carboxymethylcellul-Glycerin (LUBRICATING EYE DROPS OP) Place 1 drop into both eyes daily as needed (dry eyes). (Patient not taking: Reported on 07/23/2024)     famotidine  (PEPCID ) 40 MG tablet TAKE 1 TABLET BY MOUTH EVERY DAY (Patient not taking: Reported on 07/23/2024) 90 tablet 1   ONETOUCH ULTRA test strip USE TO TEST UP TO 4 TIMES DAILY AS DIRECTED (Patient not taking: Reported on 07/23/2024) 100 strip 5   ONETOUCH VERIO test strip USE 1 PER DAY (Patient not taking: Reported on 07/23/2024) 100 each 2   No current facility-administered medications on file prior to visit.       Objective:    Vitals:   07/23/24 0931  BP: (!) 148/92  Pulse: 88  SpO2: 97%  Weight: 202 lb 9.6 oz (91.9 kg)  Height: 5' 9 (1.753 m)   Physical Exam Gen: Pleasant, well-nourished, in no distress,  normal affect  ENT: No lesions,  mouth clear,  oropharynx clear, no postnasal drip  Neck: No JVD, no stridor  Lungs: No use of accessory muscles, no crackles or wheezing on normal respiration, no wheeze on forced expiration  Cardiovascular: RRR, heart sounds normal, no murmur or gallops, no peripheral edema  Musculoskeletal: No deformities, no cyanosis or clubbing  Neuro: alert, awake, non focal  Skin: Warm, no lesions or rashes       Assessment & Plan:   Assessment & Plan Pulmonary nodule 1 cm or greater in diameter   Assessment and Plan Assessment & Plan Enlarging right upper lobe pulmonary nodule, suspicious for malignancy Nodule enlarged to 1.7 x 6.1 x 2.9 cm with air bronchograms. Differential includes scar tissue or lung cancer. Family history and asbestos exposure increase malignancy suspicion. Further investigation needed. - Schedule repeat bronchoscopy for biopsy. - Hold aspirin for two days prior to bronchoscopy.  Chronic obstructive pulmonary disease with emphysema COPD with emphysema. Uses Stiolto as needed, remains active. - Continue current  COPD management with Stiolto, option to use daily if symptoms increase.  No follow-ups on file.  Lamar Chris, MD, PhD 07/23/2024, 4:24 PM [facility] [phone] or if no answer before 7:00PM call [phone] For any issues after 7:00PM please call eLink [phone]

## 2024-07-23 NOTE — Patient Instructions (Signed)
  VISIT SUMMARY: Today, we discussed the evaluation of your right upper lobe pulmonary nodule and your ongoing management of COPD. The nodule has increased in size, and we need to investigate further to determine if it is malignant. We also reviewed your COPD management plan.  YOUR PLAN: -ENLARGING RIGHT UPPER LOBE PULMONARY NODULE: The nodule in your right upper lung has grown in size, which raises concerns about the possibility of lung cancer, especially given your family history and exposure to harmful substances. We will schedule a repeat bronchoscopy to take a biopsy and get a clearer understanding of what it is. Please stop taking aspirin two days before the bronchoscopy.  -CHRONIC OBSTRUCTIVE PULMONARY DISEASE WITH EMPHYSEMA: COPD is a chronic lung condition that makes it hard to breathe. You are currently using a Stiolto inhaler as needed, but you have the option to use it daily if your symptoms worsen. Continue with your current activities and monitor your symptoms.  INSTRUCTIONS: Please stop taking aspirin two days before your scheduled bronchoscopy. Continue using your Stiolto inhaler as needed, but consider using it daily if your symptoms increase. Follow up with the scheduled bronchoscopy for further evaluation of the pulmonary nodule.

## 2024-07-23 NOTE — Telephone Encounter (Signed)
 Please schedule the following:  Provider performing procedure: Corky Blumstein Diagnosis: Right upper lobe nodule Which side if for nodule / mass?  Right Procedure: Robotic assisted navigational bronchoscopy Has patient been spoken to by Provider and given informed consent?  Yes Anesthesia: General Do you need Fluro?  Yes Duration of procedure: 30 minutes Date: 08/04/2024 Alternate Date: 08/05/2024 Time: Any Location: [facility] Does patient have OSA?  No DM?  Yes or Latex allergy? yes Medication Restriction/ Anticoagulate/Antiplatelet: stop ASA 2 days prior Pre-op Labs Ordered:determined by Anesthesia Imaging request: available in PACS  (If, SuperDimension CT Chest, please have STAT courier sent to ENDO)

## 2024-07-23 NOTE — H&P (View-Only) (Signed)
 Subjective:    Patient ID: [name], male    DOB: [birth year], 68 y.o.   MRN: [id number]  HPI Discussed the use of AI scribe software for clinical note transcription with the patient, who gave verbal consent to proceed.  History of Present Illness [name] is a 68 year old male with COPD who presents for evaluation of a right upper lobe pulmonary nodule. He is accompanied by his brother. He was referred by Dr. Darel for evaluation of the pulmonary nodule.  He has a history of COPD with emphysema and interstitial changes noted on CT scans. Previously, he was on oxygen  therapy until about four years ago and now uses a Stiolto inhaler as needed, although it is typically prescribed for daily use. He also carries a pulse oximeter with him. He remains active, engaging in activities such as mowing the yard, gardening, Administrator work, and shooting.  In 2023, a right upper lobe pulmonary nodule was identified, which showed subtle PET activity. A navigational bronchoscopy was attempted on June 13, 2022, but the nodule was poorly visualized and partially resolved, so biopsies were not performed. A bronchoalveolar lavage (BAL) was done with negative cultures. Follow-up imaging in February 2024 showed the nodule decreased to 10 mm, but by September 2024, it increased to 21 mm. The most recent scan on June 24, 2024, showed further enlargement to 1.7 x 6.1 x 2.9 cm with air bronchograms present.  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 He also has a family history of lung cancer, with two sisters and a brother affected.  He quit smoking in the early 2000s after being diagnosed with COPD. He was on a ventilator for ten days in 2009 due to pneumonia and carried an oxygen  tank until about four years  ago.    Results All images reviewed and interpreted by me, including CT scans of the chest from 06/2022, 06/2023, 06/24/2024.  Also interpreted prior PET scan.  RADIOLOGY CT Chest: Right upper lobe nodule increased in size to 21 mm (06/2023) CT Chest: Multifocal prominent but not pathologically enlarged right hilum, mediastinal nodes, emphysematous change with interstitial prominence, interval enlargement of peripheral right upper lobe opacity measuring 1.7 x 6.1 x 2.9 cm with air bronchograms (06/24/2024)  DIAGNOSTIC Bronchoscopy: Nodule poorly visualized, partially resolved; BAL with negative cultures (06/13/2022)    Review of Systems As per HPI  Past Medical History:  Diagnosis Date   Aortic aneurysm    COPD (chronic obstructive pulmonary disease) (HCC) 09/23/2008   -PFT 09/23/08 FEV1 2.89(84%), FVC 3.70 (78%), TLC 5.28 (78%), DLCO 64% mixed obstructive and  restrictive pattern.  Spirometry 03/11/09 FEV 3.35 (97%),FVC 4.10 (86%), FEV% 82. Steroid responsive pneumonitis 04/09- required mechanical ventilation during hospitalization - The following hospital test were negative: PCP,ANA,HIV,BAL. - ACE25,RF20,CRP 28,A1AT 263, ESR 109   Depression    Diabetes mellitus without complication (HCC)    Dyslipidemia    Dyslipidemia    Emphysema/COPD    GERD (gastroesophageal reflux disease)    History of skin cancer    Hypertension    Echo 10/09 EF 60%   ILD (interstitial lung disease) (HCC)    Panic attack    Pneumonia    2009 put on a vent    Family History  Problem Relation Age of Onset  Heart attack Father 68   Diabetes Father    Heart attack Brother    Emphysema Brother    Diabetes Brother      Social History   Socioeconomic History   Marital status: Divorced    Spouse name: Not on file   Number of children: 0   Years of education: 10th grade returned for GED   Highest education level: GED or equivalent  Occupational History   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    Social Drivers of Health   Financial Resource Strain: Low Risk  (12/31/2023)   Overall Financial Resource Strain (CARDIA)    Difficulty of Paying Living Expenses: Not hard at all  Food Insecurity: No Food Insecurity (12/31/2023)   Hunger Vital Sign    Worried About Running Out of Food in the Last Year: Never true    Ran Out of Food in the Last Year: Never true  Transportation Needs: No Transportation Needs (12/31/2023)   PRAPARE - Administrator, Civil Service (Medical): No    Lack of Transportation (Non-Medical): No  Physical Activity: Inactive (12/31/2023)   Exercise Vital Sign    Days of Exercise per Week: 0 days    Minutes of Exercise per Session: 0 min  Stress: No Stress Concern Present (12/31/2023)   [employer] of Occupational Health - Occupational Stress Questionnaire    Feeling of Stress : Not at all  Social Connections: Socially Isolated (12/31/2023)   Social Connection and Isolation Panel    Frequency of Communication with Friends and Family: More than three times a  week    Frequency of Social Gatherings with Friends and Family: Once a week    Attends Religious Services: Never    Database administrator or Organizations: No    Attends Banker Meetings: Never    Marital Status: Divorced  Catering manager Violence: Patient Unable To Answer (12/31/2023)   Humiliation, Afraid, Rape, and Kick questionnaire    Fear of Current or Ex-Partner: Patient unable to answer    Emotionally Abused: Patient unable to answer    Physically Abused: Patient unable to answer    Sexually Abused: Patient unable to answer    Allergies  Allergen Reactions   Morphine Itching and Nausea And Vomiting   Statins     weakness, muscle pains   Latex Rash  Current Outpatient Medications on File Prior to Visit  Medication Sig Dispense Refill   aspirin 81 MG EC tablet Take 81 mg by mouth at bedtime.     Blood Glucose Monitoring Suppl (ONETOUCH VERIO) w/Device KIT 1 Act by Does not apply route in the morning and at bedtime. 1 kit 2   [employer] D3 50 MCG (2000 UT) CAPS TAKE 2 CAPSULES (4,000 UNITS TOTAL) BY MOUTH DAILY. 180 capsule 1   ezetimibe  (ZETIA ) 10 MG tablet Take 1 tablet (10 mg total) by mouth daily. 90 tablet 1   fexofenadine (ALLEGRA) 180 MG tablet Take 180 mg by mouth daily.     glucose blood (ONETOUCH VERIO) test strip 1 each by Other route in the morning and at bedtime. Use as instructed 200 each 3   Lancets (ONETOUCH ULTRASOFT) lancets      metFORMIN  (GLUCOPHAGE -XR) 750 MG 24 hr tablet Take 2 tablets (1,500 mg total) by mouth daily with breakfast. 180 tablet 1   Misc Natural Products (BEET ROOT PO) Take by mouth.     NON FORMULARY Oxygen  continuous 1.5 liters with activity     olmesartan  (BENICAR ) 20 MG tablet Take 1 tablet (20 mg total) by mouth daily. 90 tablet 1   sodium chloride (OCEAN) 0.65 % SOLN nasal spray Place 1 spray into both nostrils as needed for congestion.     Tiotropium Bromide -Olodaterol (STIOLTO RESPIMAT ) 2.5-2.5 MCG/ACT AERS INHALE 2 PUFFS  BY MOUTH INTO THE LUNGS DAILY 12 g 1   Carboxymethylcellul-Glycerin (LUBRICATING EYE DROPS OP) Place 1 drop into both eyes daily as needed (dry eyes). (Patient not taking: Reported on 07/23/2024)     famotidine  (PEPCID ) 40 MG tablet TAKE 1 TABLET BY MOUTH EVERY DAY (Patient not taking: Reported on 07/23/2024) 90 tablet 1   ONETOUCH ULTRA test strip USE TO TEST UP TO 4 TIMES DAILY AS DIRECTED (Patient not taking: Reported on 07/23/2024) 100 strip 5   ONETOUCH VERIO test strip USE 1 PER DAY (Patient not taking: Reported on 07/23/2024) 100 each 2   No current facility-administered medications on file prior to visit.       Objective:    Vitals:   07/23/24 0931  BP: (!) 148/92  Pulse: 88  SpO2: 97%  Weight: 202 lb 9.6 oz (91.9 kg)  Height: 5' 9 (1.753 m)   Physical Exam Gen: Pleasant, well-nourished, in no distress,  normal affect  ENT: No lesions,  mouth clear,  oropharynx clear, no postnasal drip  Neck: No JVD, no stridor  Lungs: No use of accessory muscles, no crackles or wheezing on normal respiration, no wheeze on forced expiration  Cardiovascular: RRR, heart sounds normal, no murmur or gallops, no peripheral edema  Musculoskeletal: No deformities, no cyanosis or clubbing  Neuro: alert, awake, non focal  Skin: Warm, no lesions or rashes       Assessment & Plan:   Assessment & Plan Pulmonary nodule 1 cm or greater in diameter   Assessment and Plan Assessment & Plan Enlarging right upper lobe pulmonary nodule, suspicious for malignancy Nodule enlarged to 1.7 x 6.1 x 2.9 cm with air bronchograms. Differential includes scar tissue or lung cancer. Family history and asbestos exposure increase malignancy suspicion. Further investigation needed. - Schedule repeat bronchoscopy for biopsy. - Hold aspirin for two days prior to bronchoscopy.  Chronic obstructive pulmonary disease with emphysema COPD with emphysema. Uses Stiolto as needed, remains active. - Continue current  COPD management with Stiolto, option to use daily if symptoms increase.  No follow-ups on file.  Lamar Chris, MD, PhD 07/23/2024, 4:24 PM [facility] [phone] or if no answer before 7:00PM call [phone] For any issues after 7:00PM please call eLink [phone]

## 2024-07-23 NOTE — Telephone Encounter (Signed)
 Letter given by the nurse will send to [facility] to get auth Case# [id number]

## 2024-08-01 ENCOUNTER — Encounter (HOSPITAL_COMMUNITY): Payer: Self-pay | Admitting: Emergency Medicine

## 2024-08-01 ENCOUNTER — Other Ambulatory Visit: Payer: Self-pay | Admitting: Internal Medicine

## 2024-08-01 ENCOUNTER — Other Ambulatory Visit: Payer: Self-pay

## 2024-08-01 DIAGNOSIS — I10 Essential (primary) hypertension: Secondary | ICD-10-CM

## 2024-08-01 DIAGNOSIS — E118 Type 2 diabetes mellitus with unspecified complications: Secondary | ICD-10-CM

## 2024-08-01 NOTE — Progress Notes (Signed)
 PCP - Debby Molt, MD  Chest x-ray - 02/12/24 EKG - 02/12/24 Stress Test - 01/23/20 ECHO - 11/23/16  Fasting Blood Sugar - 155-165 Checks Blood Sugar 1-2/day  Aspirin Instructions: stop ASA 2 days prior  Anesthesia review: N  Patient verbally denies any shortness of breath, fever, cough and chest pain during phone call   -------------  SDW INSTRUCTIONS given:  Your procedure is scheduled on Monday, Oct 20th.  Report to [facility] Main Entrance A at 0700 A.M., and check in at the Admitting office.  Call this number if you have problems the morning of surgery:  [phone]   Remember:  Do not eat or drink after midnight the night before your surgery   Take these medicines the morning of surgery with A SIP OF WATER  STIOLTO   DO NOT take your metformin  the day of surgery  ** PLEASE check your blood sugar the morning of your surgery when you wake up and every 2 hours until you get to the Short Stay unit.  If your blood sugar is less than 70 mg/dL, you will need to treat for low blood sugar: Do not take insulin . Treat a low blood sugar (less than 70 mg/dL) with  cup of clear juice (cranberry or apple), 4 glucose tablets, OR glucose gel. Recheck blood sugar in 15 minutes after treatment (to make sure it is greater than 70 mg/dL). If your blood sugar is not greater than 70 mg/dL on recheck, call [phone] for further instructions.  As of today, STOP taking any Aspirin (unless otherwise instructed by your surgeon) Aleve, Naproxen, Ibuprofen, Motrin, Advil, Goody's, BC's, all herbal medications, fish oil, and all vitamins.                      Do not wear jewelry, make up, or nail polish            Do not wear lotions, powders, perfumes/colognes, or deodorant.            Do not shave 48 hours prior to surgery.  Men may shave face and neck.            Do not bring valuables to the hospital.            [facility] is not responsible for any belongings or valuables.  Do  NOT Smoke (Tobacco/Vaping) 24 hours prior to your procedure If you use a CPAP at night, you may bring all equipment for your overnight stay.   Contacts, glasses, dentures or bridgework may not be worn into surgery.      For patients admitted to the hospital, discharge time will be determined by your treatment team.   Patients discharged the day of surgery will not be allowed to drive home, and someone needs to stay with them for 24 hours.    Special instructions:   [facility]- Preparing For Surgery  Before surgery, you can play an important role. Because skin is not sterile, your skin needs to be as free of germs as possible. You can reduce the number of germs on your skin by washing with CHG (chlorahexidine gluconate) Soap before surgery.  CHG is an antiseptic cleaner which kills germs and bonds with the skin to continue killing germs even after washing.    Oral Hygiene is also important to reduce your risk of infection.  Remember - BRUSH YOUR TEETH THE MORNING OF SURGERY WITH YOUR REGULAR TOOTHPASTE  Please do not use if you have  an allergy to CHG or antibacterial soaps. If your skin becomes reddened/irritated stop using the CHG.  Do not shave (including legs and underarms) for at least 48 hours prior to first CHG shower. It is OK to shave your face.  Please follow these instructions carefully.   Shower the NIGHT BEFORE SURGERY and the MORNING OF SURGERY with DIAL Soap.   Pat yourself dry with a CLEAN TOWEL.  Wear CLEAN PAJAMAS to bed the night before surgery  Place CLEAN SHEETS on your bed the night of your first shower and DO NOT SLEEP WITH PETS.   Day of Surgery: Please shower morning of surgery  Wear Clean/Comfortable clothing the morning of surgery Do not apply any deodorants/lotions.   Remember to brush your teeth WITH YOUR REGULAR TOOTHPASTE.   Questions were answered. Patient verbalized understanding of instructions.

## 2024-08-04 ENCOUNTER — Ambulatory Visit (HOSPITAL_COMMUNITY): Admitting: Anesthesiology

## 2024-08-04 ENCOUNTER — Ambulatory Visit (HOSPITAL_COMMUNITY)

## 2024-08-04 ENCOUNTER — Ambulatory Visit (HOSPITAL_COMMUNITY)
Admission: RE | Admit: 2024-08-04 | Discharge: 2024-08-04 | Disposition: A | Discharge status: Home / Self Care | Attending: Emergency Medicine | Admitting: Emergency Medicine

## 2024-08-04 ENCOUNTER — Encounter (HOSPITAL_COMMUNITY)
Admission: RE | Disposition: A | Discharge status: Home / Self Care | Payer: Self-pay | Source: Home / Self Care | Attending: Emergency Medicine

## 2024-08-04 ENCOUNTER — Encounter (HOSPITAL_COMMUNITY): Payer: Self-pay | Admitting: Emergency Medicine

## 2024-08-04 DIAGNOSIS — F32A Depression, unspecified: Secondary | ICD-10-CM | POA: Insufficient documentation

## 2024-08-04 DIAGNOSIS — Z801 Family history of malignant neoplasm of trachea, bronchus and lung: Secondary | ICD-10-CM | POA: Insufficient documentation

## 2024-08-04 DIAGNOSIS — Z87891 Personal history of nicotine dependence: Secondary | ICD-10-CM | POA: Diagnosis not present

## 2024-08-04 DIAGNOSIS — I10 Essential (primary) hypertension: Secondary | ICD-10-CM

## 2024-08-04 DIAGNOSIS — J849 Interstitial pulmonary disease, unspecified: Secondary | ICD-10-CM | POA: Insufficient documentation

## 2024-08-04 DIAGNOSIS — Z8249 Family history of ischemic heart disease and other diseases of the circulatory system: Secondary | ICD-10-CM | POA: Diagnosis not present

## 2024-08-04 DIAGNOSIS — R911 Solitary pulmonary nodule: Secondary | ICD-10-CM

## 2024-08-04 DIAGNOSIS — J439 Emphysema, unspecified: Secondary | ICD-10-CM | POA: Insufficient documentation

## 2024-08-04 DIAGNOSIS — Z77090 Contact with and (suspected) exposure to asbestos: Secondary | ICD-10-CM | POA: Insufficient documentation

## 2024-08-04 DIAGNOSIS — F419 Anxiety disorder, unspecified: Secondary | ICD-10-CM | POA: Diagnosis not present

## 2024-08-04 DIAGNOSIS — J449 Chronic obstructive pulmonary disease, unspecified: Secondary | ICD-10-CM | POA: Diagnosis not present

## 2024-08-04 DIAGNOSIS — Z825 Family history of asthma and other chronic lower respiratory diseases: Secondary | ICD-10-CM | POA: Insufficient documentation

## 2024-08-04 DIAGNOSIS — G709 Myoneural disorder, unspecified: Secondary | ICD-10-CM | POA: Insufficient documentation

## 2024-08-04 DIAGNOSIS — K219 Gastro-esophageal reflux disease without esophagitis: Secondary | ICD-10-CM | POA: Insufficient documentation

## 2024-08-04 DIAGNOSIS — R9389 Abnormal findings on diagnostic imaging of other specified body structures: Secondary | ICD-10-CM | POA: Diagnosis not present

## 2024-08-04 DIAGNOSIS — Z833 Family history of diabetes mellitus: Secondary | ICD-10-CM | POA: Insufficient documentation

## 2024-08-04 DIAGNOSIS — Z79899 Other long term (current) drug therapy: Secondary | ICD-10-CM | POA: Diagnosis not present

## 2024-08-04 DIAGNOSIS — E785 Hyperlipidemia, unspecified: Secondary | ICD-10-CM | POA: Diagnosis not present

## 2024-08-04 DIAGNOSIS — Z572 Occupational exposure to dust: Secondary | ICD-10-CM | POA: Insufficient documentation

## 2024-08-04 DIAGNOSIS — Z7984 Long term (current) use of oral hypoglycemic drugs: Secondary | ICD-10-CM | POA: Insufficient documentation

## 2024-08-04 DIAGNOSIS — E119 Type 2 diabetes mellitus without complications: Secondary | ICD-10-CM | POA: Diagnosis not present

## 2024-08-04 DIAGNOSIS — Z48813 Encounter for surgical aftercare following surgery on the respiratory system: Secondary | ICD-10-CM | POA: Diagnosis not present

## 2024-08-04 DIAGNOSIS — R918 Other nonspecific abnormal finding of lung field: Secondary | ICD-10-CM | POA: Diagnosis not present

## 2024-08-04 LAB — CBC
HCT: 40.4 % (ref 39.0–52.0)
Hemoglobin: 13.7 g/dL (ref 13.0–17.0)
MCH: 30.6 pg (ref 26.0–34.0)
MCHC: 33.9 g/dL (ref 30.0–36.0)
MCV: 90.2 fL (ref 80.0–100.0)
Platelets: 273 K/uL (ref 150–400)
RBC: 4.48 MIL/uL (ref 4.22–5.81)
RDW: 14 % (ref 11.5–15.5)
WBC: 10 K/uL (ref 4.0–10.5)
nRBC: 0 % (ref 0.0–0.2)

## 2024-08-04 LAB — GLUCOSE, CAPILLARY: Glucose-Capillary: 182 mg/dL — ABNORMAL HIGH (ref 70–99)

## 2024-08-04 LAB — BASIC METABOLIC PANEL WITH GFR
Anion gap: 11 (ref 5–15)
BUN: 12 mg/dL (ref 8–23)
CO2: 20 mmol/L — ABNORMAL LOW (ref 22–32)
Calcium: 8.9 mg/dL (ref 8.9–10.3)
Chloride: 104 mmol/L (ref 98–111)
Creatinine, Ser: 0.87 mg/dL (ref 0.61–1.24)
GFR, Estimated: >60 mL/min (ref >60)
Glucose, Bld: 175 mg/dL — ABNORMAL HIGH (ref 70–99)
Potassium: 3.7 mmol/L (ref 3.5–5.1)
Sodium: 135 mmol/L (ref 135–145)

## 2024-08-04 SURGERY — VIDEO BRONCHOSCOPY WITH ENDOBRONCHIAL NAVIGATION
Anesthesia: General | Laterality: Right

## 2024-08-04 MED ORDER — PROPOFOL 10 MG/ML IV BOLUS
INTRAVENOUS | Status: DC | PRN
Start: 2024-08-04 — End: 2024-08-04
  Administered 2024-08-04: 150 mg via INTRAVENOUS

## 2024-08-04 MED ORDER — SUGAMMADEX SODIUM 200 MG/2ML IV SOLN
INTRAVENOUS | Status: DC | PRN
  Administered 2024-08-04: 350 mg via INTRAVENOUS

## 2024-08-04 MED ORDER — DEXAMETHASONE SOD PHOSPHATE PF 10 MG/ML IJ SOLN
INTRAMUSCULAR | Status: DC | PRN
  Administered 2024-08-04: 10 mg via INTRAVENOUS

## 2024-08-04 MED ORDER — PHENYLEPHRINE 80 MCG/ML (10ML) SYRINGE FOR IV PUSH (FOR BLOOD PRESSURE SUPPORT)
PREFILLED_SYRINGE | INTRAVENOUS | Status: DC | PRN
  Administered 2024-08-04 (×2): 160 ug via INTRAVENOUS

## 2024-08-04 MED ORDER — FENTANYL CITRATE (PF) 100 MCG/2ML IJ SOLN
INTRAMUSCULAR | Status: AC
  Filled 2024-08-04: qty 2

## 2024-08-04 MED ORDER — LACTATED RINGERS IV SOLN: INTRAVENOUS | Status: DC

## 2024-08-04 MED ORDER — GLYCOPYRROLATE 0.2 MG/ML IJ SOLN
INTRAMUSCULAR | Status: DC | PRN
  Administered 2024-08-04: .1 mg via INTRAVENOUS

## 2024-08-04 MED ORDER — ROCURONIUM BROMIDE 10 MG/ML (PF) SYRINGE
PREFILLED_SYRINGE | INTRAVENOUS | Status: DC | PRN
  Administered 2024-08-04: 60 mg via INTRAVENOUS

## 2024-08-04 MED ORDER — PROPOFOL 500 MG/50ML IV EMUL
INTRAVENOUS | Status: DC | PRN
  Administered 2024-08-04: 150 ug/kg/min via INTRAVENOUS

## 2024-08-04 MED ORDER — FENTANYL CITRATE (PF) 250 MCG/5ML IJ SOLN
INTRAMUSCULAR | Status: DC | PRN
  Administered 2024-08-04 (×2): 50 ug via INTRAVENOUS

## 2024-08-04 MED ORDER — INSULIN ASPART 100 UNIT/ML IJ SOLN
0.0000 [IU] | INTRAMUSCULAR | Status: DC | PRN
  Administered 2024-08-04: 2 [IU] via SUBCUTANEOUS

## 2024-08-04 MED ORDER — PHENYLEPHRINE HCL-NACL 20-0.9 MG/250ML-% IV SOLN
INTRAVENOUS | Status: DC | PRN
  Administered 2024-08-04: 25 ug/min via INTRAVENOUS

## 2024-08-04 MED ORDER — LIDOCAINE 2% (20 MG/ML) 5 ML SYRINGE
INTRAMUSCULAR | Status: DC | PRN
  Administered 2024-08-04: 60 mg via INTRAVENOUS

## 2024-08-04 MED ORDER — CHLORHEXIDINE GLUCONATE 0.12 % MT SOLN
15.0000 mL | OROMUCOSAL | Status: AC
  Administered 2024-08-04: 15 mL via OROMUCOSAL
  Filled 2024-08-04: qty 15

## 2024-08-04 MED ORDER — ONDANSETRON HCL 4 MG/2ML IJ SOLN
INTRAMUSCULAR | Status: DC | PRN
  Administered 2024-08-04: 4 mg via INTRAVENOUS

## 2024-08-04 SURGICAL SUPPLY — 36 items
ADAPTER BRONCHOSCOPE OLYMPUS (ADAPTER) ×1 IMPLANT
ADAPTER VALVE BIOPSY EBUS (MISCELLANEOUS) IMPLANT
BAG COUNTER SPONGE SURGICOUNT (BAG) ×1 IMPLANT
BRUSH CYTOL CELLEBRITY 1.5X140 (MISCELLANEOUS) ×1 IMPLANT
BRUSH SUPERTRAX BIOPSY (INSTRUMENTS) IMPLANT
BRUSH SUPERTRAX NDL-TIP CYTO (INSTRUMENTS) ×1 IMPLANT
CANISTER SUCTION 3000ML PPV (SUCTIONS) ×1 IMPLANT
CNTNR URN SCR LID CUP LEK RST (MISCELLANEOUS) ×1 IMPLANT
COVER BACK TABLE 60X90IN (DRAPES) ×1 IMPLANT
FILTER STRAW FLUID ASPIR (MISCELLANEOUS) IMPLANT
FORCEPS BIOP 1.5 SINGLE USE (MISCELLANEOUS) ×1 IMPLANT
FORCEPS BIOP SUPERTRX PREMAR (INSTRUMENTS) ×1 IMPLANT
GAUZE SPONGE 4X4 12PLY STRL (GAUZE/BANDAGES/DRESSINGS) ×1 IMPLANT
GLOVE BIO SURGEON STRL SZ7.5 (GLOVE) ×2 IMPLANT
GOWN STRL REUS W/ TWL LRG LVL3 (GOWN DISPOSABLE) ×2 IMPLANT
KIT CLEAN ENDO COMPLIANCE (KITS) ×1 IMPLANT
KIT LOCATABLE GUIDE (CANNULA) IMPLANT
KIT MARKER FIDUCIAL DELIVERY (KITS) IMPLANT
KIT TURNOVER KIT B (KITS) ×1 IMPLANT
MARKER SKIN DUAL TIP RULER LAB (MISCELLANEOUS) ×1 IMPLANT
NDL SUPERTRX PREMARK BIOPSY (NEEDLE) ×1 IMPLANT
NEEDLE SUPERTRX PREMARK BIOPSY (NEEDLE) ×1 IMPLANT
OIL SILICONE PENTAX (PARTS (SERVICE/REPAIRS)) ×1 IMPLANT
PAD ARMBOARD POSITIONER FOAM (MISCELLANEOUS) ×2 IMPLANT
PATCHES PATIENT (LABEL) ×3 IMPLANT
SOLN 0.9% NACL POUR BTL 1000ML (IV SOLUTION) ×1 IMPLANT
SOLN STERILE WATER BTL 1000 ML (IV SOLUTION) ×1 IMPLANT
SYR 20ML ECCENTRIC (SYRINGE) ×1 IMPLANT
SYR 20ML LL LF (SYRINGE) ×1 IMPLANT
SYR 50ML SLIP (SYRINGE) ×1 IMPLANT
TOWEL GREEN STERILE FF (TOWEL DISPOSABLE) ×1 IMPLANT
TRAP SPECIMEN MUCUS 40CC (MISCELLANEOUS) IMPLANT
TUBE CONNECTING 20X1/4 (TUBING) ×1 IMPLANT
UNDERPAD 30X36 HEAVY ABSORB (UNDERPADS AND DIAPERS) ×1 IMPLANT
VALVE BIOPSY SINGLE USE (MISCELLANEOUS) ×1 IMPLANT
VALVE SUCTION BRONCHIO DISP (MISCELLANEOUS) ×1 IMPLANT

## 2024-08-04 NOTE — Anesthesia Procedure Notes (Signed)
 Procedure Name: Intubation Date/Time: 08/04/2024 9:42 AM  Performed by: Elby Raelene SAUNDERS, CRNAPre-anesthesia Checklist: Patient identified, Emergency Drugs available, Suction available and Patient being monitored Patient Re-evaluated:Patient Re-evaluated prior to induction Oxygen  Delivery Method: Circle System Utilized Preoxygenation: Pre-oxygenation with 100% oxygen  Induction Type: IV induction and Cricoid Pressure applied Ventilation: Mask ventilation without difficulty Laryngoscope Size: Miller and 2 Grade View: Grade II Tube type: Oral Tube size: 8.5 mm Number of attempts: 1 Airway Equipment and Method: Stylet and Oral airway Placement Confirmation: ETT inserted through vocal cords under direct vision, positive ETCO2 and breath sounds checked- equal and bilateral Secured at: 23 cm Tube secured with: Tape Dental Injury: Teeth and Oropharynx as per pre-operative assessment

## 2024-08-04 NOTE — Transfer of Care (Signed)
 Immediate Anesthesia Transfer of Care Note  Patient: [name]  Procedure(s) Performed: VIDEO BRONCHOSCOPY WITH ENDOBRONCHIAL NAVIGATION (Right)  Patient Location: PACU  Anesthesia Type:General  Level of Consciousness: awake, alert , and oriented  Airway & Oxygen  Therapy: Patient Spontanous Breathing  Post-op Assessment: Report given to RN and Post -op Vital signs reviewed and stable  Post vital signs: Reviewed and stable  Last Vitals:  Vitals Value Taken Time  BP    Temp    Pulse    Resp    SpO2      Last Pain:  Vitals:   08/04/24 0721  TempSrc:   PainSc: 0-No pain      Patients Stated Pain Goal: 0 (08/04/24 0721)  Complications: No notable events documented.

## 2024-08-04 NOTE — Discharge Instructions (Signed)
 Flexible Bronchoscopy, Care After This sheet gives you information about how to care for yourself after your test. Your doctor may also give you more specific instructions. If you have problems or questions, contact your doctor. Follow these instructions at home: Eating and drinking When you are wide awake, your numbness is gone and your cough and gag reflexes have come back, you may: Start eating only soft foods. Slowly drink liquids. Six hours after the test, go back to your normal diet. Driving Do not drive for 24 hours if you were given a medicine to help you relax (sedative). Do not drive or use heavy machinery while taking prescription pain medicine. General instructions Take over-the-counter and prescription medicines only as told by your doctor. Return to your normal activities as told. Ask what activities are safe for you. Do not use any products that have nicotine or tobacco in them. This includes cigarettes and e-cigarettes. If you need help quitting, ask your doctor. Keep all follow-up visits as told by your doctor. This is important. It is very important if you had a tissue sample (biopsy) taken. Get help right away if: You have shortness of breath that gets worse. You get light-headed. You feel like you are going to pass out (faint). You have chest pain. You cough up: More than a little blood. More blood than before. Summary Do not use cigarettes. Do not use e-cigarettes. Seek care in the Emergency Department right away if you have chest pain or shortness of breath. Call or MyChart Message our office for any questions or problems at [phone].  Okay to start aspirin on 08/05/2024  This information is not intended to replace advice given to you by your health care provider. Make sure you discuss any questions you have with your health care provider.

## 2024-08-04 NOTE — Interval H&P Note (Signed)
 History and Physical Interval Note:  08/04/2024 7:43 AM  [name]  has presented today for surgery, with the diagnosis of Right upper lobe nodule.  The various methods of treatment have been discussed with the patient and family. After consideration of risks, benefits and other options for treatment, the patient has consented to  Procedure(s): VIDEO BRONCHOSCOPY WITH ENDOBRONCHIAL NAVIGATION (Right) as a surgical intervention.  The patient's history has been reviewed, patient examined, no change in status, stable for surgery.  I have reviewed the patient's chart and labs.  Questions were answered to the patient's satisfaction.     Lamar GORMAN Chris

## 2024-08-04 NOTE — Op Note (Signed)
 Procedure Note  Patient: [name]  [employer] Cios mobile C-arm was utilized to identify and biopsy right upper lobe nodular opacity.  Needle-in-lesion was confirmed using real-time Cios imaging, and images were uploaded to PACS.      Lamar Chris, MD, PhD 08/04/2024, 10:27 AM [facility] [phone] or if no answer before 7:00PM call [phone] For any issues after 7:00PM please call eLink [phone]

## 2024-08-04 NOTE — Anesthesia Preprocedure Evaluation (Addendum)
 Anesthesia Evaluation  Patient identified by MRN, date of birth, ID band Patient awake    Reviewed: Allergy & Precautions, NPO status , Patient's Chart, lab work & pertinent test results  Airway Mallampati: III  TM Distance: >3 FB Neck ROM: Full    Dental no notable dental hx. (+) Teeth Intact, Dental Advisory Given   Pulmonary pneumonia, resolved, COPD,  COPD inhaler, former smoker Enlarging Pulmonary nodule RUL Hx/o ILD  Quit smoking 2005 16 pk/yrs   breath sounds clear to auscultation + decreased breath sounds      Cardiovascular hypertension, Pt. on medications Normal cardiovascular exam Rhythm:Regular Rate:Normal  Echo 2018 Left ventricle: The cavity size was normal. Wall thickness was    increased in a pattern of mild LVH. Systolic function was normal.    The estimated ejection fraction was in the range of 60% to 65%.    Wall motion was normal; there were no regional wall motion    abnormalities. Doppler parameters are consistent with abnormal    left ventricular relaxation (grade 1 diastolic dysfunction).  - Aortic root: The aortic root was mildly dilated.  - Mitral valve: Calcified annulus.   EKG 08/13/23 NSR, Normal  Stress Test 02/02/20  The left ventricular ejection fraction is normal (55-65%).  Nuclear stress EF: 67%.  There was no ST segment deviation noted during stress.  This is a low risk study.      Neuro/Psych  PSYCHIATRIC DISORDERS Anxiety Depression     Neuromuscular disease negative neurological ROS     GI/Hepatic Neg liver ROS,GERD  Medicated,,  Endo/Other  diabetes, Well Controlled, Type 2, Oral Hypoglycemic Agents  HLD  Renal/GU negative Renal ROS  negative genitourinary   Musculoskeletal negative musculoskeletal ROS (+)    Abdominal   Peds  Hematology negative hematology ROS (+)   Anesthesia Other Findings   Reproductive/Obstetrics negative OB ROS                               Anesthesia Physical Anesthesia Plan  ASA: 3  Anesthesia Plan: General   Post-op Pain Management: Minimal or no pain anticipated   Induction: Intravenous and Cricoid pressure planned  PONV Risk Score and Plan: 3 and Treatment may vary due to age or medical condition, TIVA, Propofol  infusion and Ondansetron   Airway Management Planned: Oral ETT  Additional Equipment: None  Intra-op Plan:   Post-operative Plan: Extubation in OR  Informed Consent: I have reviewed the patients History and Physical, chart, labs and discussed the procedure including the risks, benefits and alternatives for the proposed anesthesia with the patient or authorized representative who has indicated his/her understanding and acceptance.     Dental advisory given  Plan Discussed with: CRNA and Anesthesiologist  Anesthesia Plan Comments:          Anesthesia Quick Evaluation

## 2024-08-04 NOTE — Anesthesia Postprocedure Evaluation (Signed)
 Anesthesia Post Note  Patient: [name]  Procedure(s) Performed: VIDEO BRONCHOSCOPY WITH ENDOBRONCHIAL NAVIGATION (Right)     Patient location during evaluation: PACU Anesthesia Type: General Level of consciousness: awake and alert and oriented Pain management: pain level controlled Vital Signs Assessment: post-procedure vital signs reviewed and stable Respiratory status: spontaneous breathing, nonlabored ventilation and respiratory function stable Cardiovascular status: blood pressure returned to baseline and stable Postop Assessment: no apparent nausea or vomiting Anesthetic complications: no   No notable events documented.  Last Vitals:  Vitals:   08/04/24 1140 08/04/24 1150  BP: 134/89 131/89  Pulse: 70 69  Resp: 13 11  Temp:    SpO2: 92% 95%    Last Pain:  Vitals:   08/04/24 1120  TempSrc:   PainSc: 0-No pain   Pain Goal: Patients Stated Pain Goal: 0 (08/04/24 0721)                 Blandina Renaldo A.

## 2024-08-04 NOTE — Op Note (Signed)
 Video Bronchoscopy with Robotic Assisted Bronchoscopic Navigation   Date of Operation: 08/04/2024   Pre-op Diagnosis: Right upper lobe opacity  Post-op Diagnosis: Same  Surgeon: Lamar Chris  Assistants: None  Anesthesia: General endotracheal anesthesia  Operation: Flexible video fiberoptic bronchoscopy with robotic assistance and biopsies.  Estimated Blood Loss: Minimal  Complications: None  Indications and History: [name] is a 68 y.o. male with history of COPD, interstitial lung disease, pneumonias who is followed by Dr. Theophilus in our office.  He has a history of a waxing and waning right upper lobe parenchymal opacity that has been progressive on his most recent imaging biopsy was attempted 05/2022 but the opacity was poorly visualized and no samples were taken.  Cultures were negative at that time.  Recommendation made to achieve a tissue diagnosis and culture data via robotic assisted navigational bronchoscopy.  The risks, benefits, complications, treatment options and expected outcomes were discussed with the patient.  The possibilities of pneumothorax, pneumonia, reaction to medication, pulmonary aspiration, perforation of a viscus, bleeding, failure to diagnose a condition and creating a complication requiring transfusion or operation were discussed with the patient who freely signed the consent.    Description of Procedure: The patient was seen in the Preoperative Area, was examined and was deemed appropriate to proceed.  The patient was taken to [facility] Endoscopy room 3, identified as [name] and the procedure verified as Flexible Video Fiberoptic Bronchoscopy.  A Time Out was held and the above information confirmed.   Prior to the date of the procedure a high-resolution CT scan of the chest was performed. Utilizing ION software program a virtual tracheobronchial tree was generated to allow the creation of distinct navigation pathways to the patient's parenchymal  abnormalities. After being taken to the operating room general anesthesia was initiated and the patient  was orally intubated. The video fiberoptic bronchoscope was introduced via the endotracheal tube and a general inspection was performed which showed normal right and left lung anatomy. Aspiration of the bilateral mainstems was completed to remove any remaining secretions. Robotic catheter inserted into patient's endotracheal tube.   Target #1 right upper lobe opacity: The distinct navigation pathways prepared prior to this procedure were then utilized to navigate to patient's lesion identified on CT scan. The robotic catheter was secured into place and the vision probe was withdrawn.  Lesion location was approximated using fluoroscopy.  Local registration and targeting was performed using [employer] Cios mobile C-arm three-dimensional imaging. Under fluoroscopic guidance transbronchial needle biopsies and transbronchial cryoprobe s biopsies were performed to be sent for cytology and pathology.  A single needle pass was placed into sterile saline and sent for microbiology.  Needle-in-lesion was confirmed using Cios mobile C-arm.  A bronchioalveolar lavage was performed in the right upper lobe adjacent to the opacity and sent microbiology.     At the end of the procedure a general airway inspection was performed and there was no evidence of active bleeding. The bronchoscope was removed.  The patient tolerated the procedure well. There was no significant blood loss and there were no obvious complications. A post-procedural chest x-ray is pending.  Samples Target #1: 1. Transbronchial Wang needle biopsies from right upper lobe opacity 2. Transbronchial cryoprobe biopsies from right upper lobe opacity 3. Bronchoalveolar lavage from right upper lobe   Plans:  The patient will be discharged from the PACU to home when recovered from anesthesia and after chest x-ray is reviewed. We will review the  cytology, pathology and  microbiology results with the patient when they become available. Outpatient followup will be with Dr. Theophilus and CANDIE Lites, NP.    Lamar Chris, MD, PhD 08/04/2024, 10:25 AM [facility] [phone] or if no answer before 7:00PM call [phone] For any issues after 7:00PM please call eLink [phone]

## 2024-08-05 NOTE — Progress Notes (Unsigned)
 [name] [address] [place] [address] Phone: [phone]   Assessment and Plan:     1. Neck pain (Primary) 2. DDD (degenerative disc disease), cervical 3. Strain of right trapezius muscle, initial encounter 4. Chronic right shoulder pain -Chronic with exacerbation, subsequent visit - Overall improvement in right shoulder pain after subacromial CSI on 05/28/2024, however patient has had progressive neck pain with radicular symptoms through right trapezius since previous office visit.  Most consistent with cervical DDD with localized muscular dysfunction - X-ray obtained in clinic.  My interpretation: No acute fracture or vertebral collapse.  Degenerative changes throughout cervical spine most pronounced at C4-C5-C6 - Start meloxicam 15 mg daily x2 weeks.  If still having pain after 2 weeks, complete 3rd-week of NSAID. May use remaining NSAID as needed once daily for pain control.  Do not to use additional over-the-counter NSAIDs (ibuprofen, naproxen, Advil, Aleve, etc.) while taking prescription NSAIDs.  May use Tylenol  [phone] mg 2 to 3 times a day for breakthrough pain. - Start HEP for neck and trapezius - Recommend using heating pads over areas of muscle pain   15 additional minutes spent for educating Therapeutic Home Exercise Program.  This included exercises focusing on stretching, strengthening, with focus on eccentric aspects.   Long term goals include an improvement in range of motion, strength, endurance as well as avoiding reinjury. Patient's frequency would include in 1-2 times a day, 3-5 times a week for a duration of 6-12 weeks. Proper technique shown and discussed handout in great detail with ATC.  All questions were discussed and answered.    Pertinent previous records reviewed include none   Follow Up: 4 to 6 weeks for reevaluation.  Could consider advanced imaging versus trigger point injections versus repeat  shoulder CSI versus physical therapy based on presentation   Subjective:   I, [name], am serving as a Neurosurgeon for [name]  Chief Complaint: right shoulder pain    HPI:    05/28/2024 Patient is a 68 year old male with right shoulder pain. Patient states pain started 6-7 years ago. Pain has increased over the last 2-3 months . Hx of arthritis. Pain when sleeping on his side.  Pain is worse in the AM. Decreased ROM. Arthritis meds and salonpase help. Pain does radiate up to the neck.   06/25/2024 Patient states that he is feeling pretty good today. Shoulder is a lot better. Left hip has been giving him some issues lately. Couple weeks ago it started and it hurt for a full week. Taking BC powder and goody powder for arthritis as needed.   08/06/2024 Patient states neck has been tight for a couple of weeks now. Pain radiates to the top of his head and to his shoulder. Decreased ROM   Relevant Historical Information: Hypertension, DM type II  Additional pertinent review of systems negative.   Current Outpatient Medications:    meloxicam (MOBIC) 15 MG tablet, Take 1 tablet daily for 2 weeks.  If still in pain after 2 weeks, take 1 tablet daily for an additional 1 week., Disp: 30 tablet, Rfl: 0   aspirin 81 MG EC tablet, Take 81 mg by mouth at bedtime., Disp: , Rfl:    Blood Glucose Monitoring Suppl (ONETOUCH VERIO) w/Device KIT, 1 Act by Does not apply route in the morning and at bedtime., Disp: 1 kit, Rfl: 2   Carboxymethylcellul-Glycerin (LUBRICATING EYE DROPS OP), Place 1 drop into both eyes  daily as needed (dry eyes)., Disp: , Rfl:    [employer] D3 50 MCG (2000 UT) CAPS, TAKE 2 CAPSULES (4,000 UNITS TOTAL) BY MOUTH DAILY., Disp: 180 capsule, Rfl: 1   ezetimibe  (ZETIA ) 10 MG tablet, Take 1 tablet (10 mg total) by mouth daily., Disp: 90 tablet, Rfl: 1   famotidine  (PEPCID ) 40 MG tablet, TAKE 1 TABLET BY MOUTH EVERY DAY (Patient not taking: Reported on 07/23/2024), Disp: 90  tablet, Rfl: 1   fexofenadine (ALLEGRA) 180 MG tablet, Take 180 mg by mouth daily., Disp: , Rfl:    glucose blood (ONETOUCH VERIO) test strip, 1 each by Other route in the morning and at bedtime. Use as instructed, Disp: 200 each, Rfl: 3   Lancets (ONETOUCH ULTRASOFT) lancets, , Disp: , Rfl:    metFORMIN  (GLUCOPHAGE -XR) 750 MG 24 hr tablet, TAKE 2 TABLETS (1,500 MG TOTAL) BY MOUTH EVERY DAY WITH BREAKFAST, Disp: 180 tablet, Rfl: 1   Misc Natural Products (BEET ROOT PO), Take by mouth., Disp: , Rfl:    NON FORMULARY, Oxygen  continuous 1.5 liters with activity, Disp: , Rfl:    olmesartan  (BENICAR ) 20 MG tablet, TAKE 1 TABLET BY MOUTH EVERY DAY, Disp: 90 tablet, Rfl: 1   ONETOUCH ULTRA test strip, USE TO TEST UP TO 4 TIMES DAILY AS DIRECTED (Patient not taking: Reported on 07/23/2024), Disp: 100 strip, Rfl: 5   ONETOUCH VERIO test strip, USE 1 PER DAY (Patient not taking: Reported on 07/23/2024), Disp: 100 each, Rfl: 2   sodium chloride (OCEAN) 0.65 % SOLN nasal spray, Place 1 spray into both nostrils as needed for congestion., Disp: , Rfl:    Tiotropium Bromide -Olodaterol (STIOLTO RESPIMAT ) 2.5-2.5 MCG/ACT AERS, INHALE 2 PUFFS BY MOUTH INTO THE LUNGS DAILY, Disp: 12 g, Rfl: 1   Objective:     Vitals:   08/06/24 0918  BP: 136/82  Pulse: (!) 102  SpO2: 95%  Weight: 203 lb (92.1 kg)  Height: 5' 9 (1.753 m)      Body mass index is 29.98 kg/m.    Physical Exam:    Neck Exam: Cervical Spine- Posture normal Skin- normal, intact  Neuro:  Strength-  Right Left   Deltoid (C5) 5/5 5/5  Bicep/Brachioradialis (C5/6) 5/5  5/5  Wrist Extension (C6) 5/5 5/5  Tricep (C7) 5/5 5/5  Wrist Flexion (C7) 5/5 5/5  Grip (C8) 5/5 5/5  Finger Abduction (T1) 5/5 5/5   Sensation: intact to light touch in upper extremities bilaterally  Spurling's:  negative bilaterally Neck ROM:   active ROM limited in all directions TTP: Right trapezius, right cervical paraspinal NTTP: cervical spinous processes,  left cervical paraspinal, thoracic paraspinal, left trapezius    Electronically signed by:  [name] 9:57 AM 08/06/24

## 2024-08-06 ENCOUNTER — Ambulatory Visit: Admitting: Sports Medicine

## 2024-08-06 ENCOUNTER — Encounter (HOSPITAL_COMMUNITY): Payer: Self-pay | Admitting: Emergency Medicine

## 2024-08-06 ENCOUNTER — Ambulatory Visit

## 2024-08-06 VITALS — BP 136/82 | HR 102 | Ht 69.0 in | Wt 203.0 lb

## 2024-08-06 DIAGNOSIS — G8929 Other chronic pain: Secondary | ICD-10-CM

## 2024-08-06 DIAGNOSIS — M542 Cervicalgia: Secondary | ICD-10-CM

## 2024-08-06 DIAGNOSIS — S46811A Strain of other muscles, fascia and tendons at shoulder and upper arm level, right arm, initial encounter: Secondary | ICD-10-CM

## 2024-08-06 DIAGNOSIS — M25511 Pain in right shoulder: Secondary | ICD-10-CM

## 2024-08-06 DIAGNOSIS — M5030 Other cervical disc degeneration, unspecified cervical region: Secondary | ICD-10-CM

## 2024-08-06 DIAGNOSIS — M4312 Spondylolisthesis, cervical region: Secondary | ICD-10-CM | POA: Diagnosis not present

## 2024-08-06 DIAGNOSIS — M47812 Spondylosis without myelopathy or radiculopathy, cervical region: Secondary | ICD-10-CM | POA: Diagnosis not present

## 2024-08-06 DIAGNOSIS — M4802 Spinal stenosis, cervical region: Secondary | ICD-10-CM | POA: Diagnosis not present

## 2024-08-06 LAB — ACID FAST SMEAR (AFB, MYCOBACTERIA)
Acid Fast Smear: NEGATIVE
Acid Fast Smear: NEGATIVE

## 2024-08-06 LAB — CULTURE, BAL-QUANTITATIVE W GRAM STAIN
Culture: NO GROWTH
Gram Stain: NONE SEEN

## 2024-08-06 LAB — CYTOLOGY - NON PAP

## 2024-08-06 MED ORDER — MELOXICAM 15 MG PO TABS: ORAL_TABLET | ORAL | 0 refills | Status: AC

## 2024-08-06 NOTE — Patient Instructions (Addendum)
 Xrays on the way out   - Start meloxicam 15 mg daily x2 weeks.  If still having pain after 2 weeks, complete 3rd-week of NSAID. May use remaining NSAID as needed once daily for pain control.  Do not to use additional over-the-counter NSAIDs (ibuprofen, naproxen, Advil, Aleve, etc.) while taking prescription NSAIDs.  May use Tylenol  [phone] mg 2 to 3 times a day for breakthrough pain.  Neck HEP   4-6 week follow up

## 2024-08-09 LAB — AEROBIC/ANAEROBIC CULTURE W GRAM STAIN (SURGICAL/DEEP WOUND)
Culture: NO GROWTH
Culture: NO GROWTH
Gram Stain: NONE SEEN

## 2024-08-11 ENCOUNTER — Ambulatory Visit: Admitting: Acute Care

## 2024-08-11 ENCOUNTER — Encounter: Payer: Self-pay | Admitting: Acute Care

## 2024-08-11 VITALS — BP 152/86 | HR 71 | Temp 99.0°F | Wt 207.2 lb

## 2024-08-11 DIAGNOSIS — Z87891 Personal history of nicotine dependence: Secondary | ICD-10-CM

## 2024-08-11 DIAGNOSIS — R911 Solitary pulmonary nodule: Secondary | ICD-10-CM

## 2024-08-11 DIAGNOSIS — R9389 Abnormal findings on diagnostic imaging of other specified body structures: Secondary | ICD-10-CM

## 2024-08-11 DIAGNOSIS — R942 Abnormal results of pulmonary function studies: Secondary | ICD-10-CM

## 2024-08-11 DIAGNOSIS — J449 Chronic obstructive pulmonary disease, unspecified: Secondary | ICD-10-CM | POA: Diagnosis not present

## 2024-08-11 MED ORDER — ALBUTEROL SULFATE HFA 108 (90 BASE) MCG/ACT IN AERS: 1.0000 | INHALATION_SPRAY | Freq: Four times a day (QID) | RESPIRATORY_TRACT | 2 refills | Status: AC | PRN
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
## 2024-08-11 NOTE — Patient Instructions (Addendum)
 It is good to see you today. I am glad you have done well since your biopsy. We have reviewed your biopsy results. This showed atypical cells that the pathologist feels are reactive histiocytes. This is reassuring. We will do a 3 month follow up CT Chest. This will be due 10/2024. You will follow up with me 1-2 weeks after the scan to review results. Call for any unexplained weight loss or if you cough up any blood so we can see you sooner.  Continue using albuterol  as needed for shortness of breath or wheezing. I have sent in a refill.   Please contact office for sooner follow up if symptoms do not improve or worsen or seek emergency care

## 2024-08-29 LAB — OPHTHALMOLOGY REPORT-SCANNED

## 2024-09-02 NOTE — Progress Notes (Unsigned)
 [name] [address] [place] [address] Phone: [phone]   Assessment and Plan:     1. Neck pain (Primary) 2. DDD (degenerative disc disease), cervical 3. Strain of right trapezius muscle, subsequent encounter 4. Chronic right shoulder pain -Chronic with exacerbation, subsequent visit - Overall significant improvement in right shoulder pain with subacromial CSI on 05/28/2024, and overall improvement in neck pain radiating into right trapezius musculature with HEP, meloxicam course.  Neck pain is still most consistent with cervical DDD causing intermittent flares of pain that are currently fairly well-controlled - Use meloxicam 15 mg daily as needed for breakthrough pain.  Recommend limiting chronic NSAIDs to 1-2 doses per week to prevent long-term side effects. Use Tylenol  500 to 1000 mg tablets 2-3 times a day as needed for day-to-day pain relief.    - May use topical Voltaren gel over areas of pain - Continue HEP for neck and trapezius    Pertinent previous records reviewed include none   Follow Up: As needed if no improvement or worsening of symptoms.  If pain returns, patient can contact us  to order cervical spine MRI and would then follow-up with patient 1 week after MRI is performed   Subjective:   I, [name], am serving as a neurosurgeon for Doctor [name]  Chief Complaint: right shoulder pain    HPI:    05/28/2024 Patient is a 68 year old male with right shoulder pain. Patient states pain started 6-7 years ago. Pain has increased over the last 2-3 months . Hx of arthritis. Pain when sleeping on his side.  Pain is worse in the AM. Decreased ROM. Arthritis meds and salonpase help. Pain does radiate up to the neck.   06/25/2024 Patient states that he is feeling pretty good today. Shoulder is a lot better. Left hip has been giving him some issues lately. Couple weeks ago it started and it hurt for a full  week. Taking BC powder and goody powder for arthritis as needed.    08/06/2024 Patient states neck has been tight for a couple of weeks now. Pain radiates to the top of his head and to his shoulder. Decreased ROM  09/03/2024 Patient states shoulder is good. But neck is still intermittent pain   Relevant Historical Information: Hypertension, DM type II  Additional pertinent review of systems negative.   Current Outpatient Medications:    albuterol  (VENTOLIN  HFA) 108 (90 Base) MCG/ACT inhaler, Inhale 1-2 puffs into the lungs every 6 (six) hours as needed for wheezing or shortness of breath., Disp: 8 g, Rfl: 2   aspirin 81 MG EC tablet, Take 81 mg by mouth at bedtime., Disp: , Rfl:    Blood Glucose Monitoring Suppl (ONETOUCH VERIO) w/Device KIT, 1 Act by Does not apply route in the morning and at bedtime., Disp: 1 kit, Rfl: 2   Carboxymethylcellul-Glycerin (LUBRICATING EYE DROPS OP), Place 1 drop into both eyes daily as needed (dry eyes)., Disp: , Rfl:    [facility]S D3 50 MCG (2000 UT) CAPS, TAKE 2 CAPSULES (4,000 UNITS TOTAL) BY MOUTH DAILY., Disp: 180 capsule, Rfl: 1   ezetimibe  (ZETIA ) 10 MG tablet, Take 1 tablet (10 mg total) by mouth daily., Disp: 90 tablet, Rfl: 1   famotidine  (PEPCID ) 40 MG tablet, TAKE 1 TABLET BY MOUTH EVERY DAY (Patient not taking: Reported on 08/11/2024), Disp: 90 tablet, Rfl: 1   fexofenadine (ALLEGRA) 180 MG tablet, Take 180 mg by mouth daily., Disp: ,  Rfl:    glucose blood (ONETOUCH VERIO) test strip, 1 each by Other route in the morning and at bedtime. Use as instructed, Disp: 200 each, Rfl: 3   Lancets (ONETOUCH ULTRASOFT) lancets, , Disp: , Rfl:    meloxicam (MOBIC) 15 MG tablet, Take 1 tablet daily for 2 weeks.  If still in pain after 2 weeks, take 1 tablet daily for an additional 1 week., Disp: 30 tablet, Rfl: 0   metFORMIN  (GLUCOPHAGE -XR) 750 MG 24 hr tablet, TAKE 2 TABLETS (1,500 MG TOTAL) BY MOUTH EVERY DAY WITH BREAKFAST, Disp: 180 tablet, Rfl: 1   Misc Natural  Products (BEET ROOT PO), Take by mouth., Disp: , Rfl:    NON FORMULARY, Oxygen  continuous 1.5 liters with activity, Disp: , Rfl:    olmesartan  (BENICAR ) 20 MG tablet, TAKE 1 TABLET BY MOUTH EVERY DAY, Disp: 90 tablet, Rfl: 1   ONETOUCH ULTRA test strip, USE TO TEST UP TO 4 TIMES DAILY AS DIRECTED (Patient not taking: Reported on 08/11/2024), Disp: 100 strip, Rfl: 5   ONETOUCH VERIO test strip, USE 1 PER DAY (Patient not taking: Reported on 08/11/2024), Disp: 100 each, Rfl: 2   sodium chloride (OCEAN) 0.65 % SOLN nasal spray, Place 1 spray into both nostrils as needed for congestion., Disp: , Rfl:    Tiotropium Bromide -Olodaterol (STIOLTO RESPIMAT ) 2.5-2.5 MCG/ACT AERS, INHALE 2 PUFFS BY MOUTH INTO THE LUNGS DAILY, Disp: 12 g, Rfl: 1   Objective:     Vitals:   09/03/24 0926  BP: 130/82  Pulse: 95  SpO2: 99%  Weight: 209 lb (94.8 kg)  Height: 5' 9 (1.753 m)      Body mass index is 30.86 kg/m.    Physical Exam:    Neck Exam: Cervical Spine- Posture normal Skin- normal, intact   Neuro:  Strength-   Right Left  Deltoid (C5) 5/5 5/5 Bicep/Brachioradialis (C5/6) 5/5  5/5 Wrist Extension (C6) 5/5 5/5 Tricep (C7) 5/5 5/5 Wrist Flexion (C7) 5/5 5/5 Grip (C8) 5/5 5/5 Finger Abduction (T1) 5/5 5/5   Sensation: intact to light touch in upper extremities bilaterally   Spurling's:  negative bilaterally Neck ROM:   active ROM limited in all directions TTP: Mildly right trapezius, right cervical paraspinal NTTP: cervical spinous processes, left cervical paraspinal, thoracic paraspinal, left trapezius     Electronically signed by:  [name] 9:39 AM 09/03/24

## 2024-09-03 ENCOUNTER — Ambulatory Visit: Admitting: Sports Medicine

## 2024-09-03 VITALS — BP 130/82 | HR 95 | Ht 69.0 in | Wt 209.0 lb

## 2024-09-03 DIAGNOSIS — M25511 Pain in right shoulder: Secondary | ICD-10-CM

## 2024-09-03 DIAGNOSIS — S46811D Strain of other muscles, fascia and tendons at shoulder and upper arm level, right arm, subsequent encounter: Secondary | ICD-10-CM | POA: Diagnosis not present

## 2024-09-03 DIAGNOSIS — M5030 Other cervical disc degeneration, unspecified cervical region: Secondary | ICD-10-CM

## 2024-09-03 DIAGNOSIS — G8929 Other chronic pain: Secondary | ICD-10-CM | POA: Diagnosis not present

## 2024-09-03 DIAGNOSIS — M542 Cervicalgia: Secondary | ICD-10-CM

## 2024-09-03 LAB — FUNGUS CULTURE WITH STAIN

## 2024-09-03 LAB — FUNGUS CULTURE RESULT

## 2024-09-03 LAB — FUNGAL ORGANISM REFLEX

## 2024-09-03 NOTE — Patient Instructions (Signed)
-   Use meloxicam 15 mg daily as needed for breakthrough pain.  Recommend limiting chronic NSAIDs to 1-2 doses per week to prevent long-term side effects. Use Tylenol  500 to 1000 mg tablets 2-3 times a day as needed for day-to-day pain relief.    Call if you need a refill   Voltaren gel over areas of pain   If neck pain returns call and ask for MRI we will order and you will follow up 1 week after imaging   As needed follow up

## 2024-09-04 LAB — FUNGUS CULTURE WITH STAIN

## 2024-09-04 LAB — FUNGAL ORGANISM REFLEX

## 2024-09-04 LAB — FUNGUS CULTURE RESULT

## 2024-09-18 LAB — ACID FAST CULTURE WITH REFLEXED SENSITIVITIES (MYCOBACTERIA)
Acid Fast Culture: NEGATIVE
Acid Fast Culture: NEGATIVE

## 2024-11-05 ENCOUNTER — Other Ambulatory Visit: Payer: Self-pay | Admitting: Internal Medicine

## 2024-11-05 DIAGNOSIS — E785 Hyperlipidemia, unspecified: Secondary | ICD-10-CM

## 2024-11-11 ENCOUNTER — Other Ambulatory Visit

## 2024-11-13 ENCOUNTER — Encounter: Payer: Self-pay | Admitting: Internal Medicine

## 2024-11-13 ENCOUNTER — Other Ambulatory Visit: Payer: Self-pay | Admitting: Internal Medicine

## 2024-11-13 ENCOUNTER — Ambulatory Visit: Payer: Self-pay | Admitting: Internal Medicine

## 2024-11-13 ENCOUNTER — Ambulatory Visit

## 2024-11-13 VITALS — BP 132/80 | HR 102 | Temp 97.3°F | Ht 69.0 in | Wt 206.2 lb

## 2024-11-13 DIAGNOSIS — J8410 Pulmonary fibrosis, unspecified: Secondary | ICD-10-CM

## 2024-11-13 DIAGNOSIS — R079 Chest pain, unspecified: Secondary | ICD-10-CM

## 2024-11-13 DIAGNOSIS — E781 Pure hyperglyceridemia: Secondary | ICD-10-CM

## 2024-11-13 DIAGNOSIS — T466X5A Adverse effect of antihyperlipidemic and antiarteriosclerotic drugs, initial encounter: Secondary | ICD-10-CM

## 2024-11-13 DIAGNOSIS — E119 Type 2 diabetes mellitus without complications: Secondary | ICD-10-CM | POA: Diagnosis not present

## 2024-11-13 DIAGNOSIS — J438 Other emphysema: Secondary | ICD-10-CM

## 2024-11-13 DIAGNOSIS — R052 Subacute cough: Secondary | ICD-10-CM | POA: Diagnosis not present

## 2024-11-13 DIAGNOSIS — R9431 Abnormal electrocardiogram [ECG] [EKG]: Secondary | ICD-10-CM | POA: Diagnosis not present

## 2024-11-13 DIAGNOSIS — R000 Tachycardia, unspecified: Secondary | ICD-10-CM | POA: Diagnosis not present

## 2024-11-13 DIAGNOSIS — Z1211 Encounter for screening for malignant neoplasm of colon: Secondary | ICD-10-CM | POA: Insufficient documentation

## 2024-11-13 DIAGNOSIS — E118 Type 2 diabetes mellitus with unspecified complications: Secondary | ICD-10-CM

## 2024-11-13 DIAGNOSIS — Z794 Long term (current) use of insulin: Secondary | ICD-10-CM

## 2024-11-13 DIAGNOSIS — G720 Drug-induced myopathy: Secondary | ICD-10-CM

## 2024-11-13 DIAGNOSIS — I10 Essential (primary) hypertension: Secondary | ICD-10-CM | POA: Diagnosis not present

## 2024-11-13 LAB — TROPONIN I (HIGH SENSITIVITY): High Sens Troponin I: 3 ng/L (ref 2–17)

## 2024-11-13 LAB — URINALYSIS, ROUTINE W REFLEX MICROSCOPIC
Bilirubin Urine: NEGATIVE
Hgb urine dipstick: NEGATIVE
Ketones, ur: NEGATIVE
Leukocytes,Ua: NEGATIVE
Nitrite: NEGATIVE
Specific Gravity, Urine: 1.01 (ref 1.000–1.030)
Total Protein, Urine: NEGATIVE
Urine Glucose: 500 — AB
Urobilinogen, UA: 0.2 (ref 0.0–1.0)
pH: 5.5 (ref 5.0–8.0)

## 2024-11-13 LAB — MICROALBUMIN / CREATININE URINE RATIO
Creatinine,U: 47.5 mg/dL
Microalb Creat Ratio: UNDETERMINED mg/g (ref 0.0–30.0)
Microalb, Ur: <0.7 mg/dL

## 2024-11-13 LAB — LIPID PANEL
Cholesterol: 197 mg/dL (ref 28–200)
HDL: 40 mg/dL
LDL Cholesterol: 109 mg/dL — ABNORMAL HIGH (ref 10–99)
NonHDL: 157.41
Total CHOL/HDL Ratio: 5
Triglycerides: 241 mg/dL — ABNORMAL HIGH (ref 10.0–149.0)
VLDL: 48.2 mg/dL — ABNORMAL HIGH (ref 0.0–40.0)

## 2024-11-13 LAB — BASIC METABOLIC PANEL WITH GFR
BUN: 12 mg/dL (ref 6–23)
CO2: 23 meq/L (ref 19–32)
Calcium: 9.6 mg/dL (ref 8.4–10.5)
Chloride: 104 meq/L (ref 96–112)
Creatinine, Ser: 0.83 mg/dL (ref 0.40–1.50)
GFR: 88.43 mL/min
Glucose, Bld: 220 mg/dL — ABNORMAL HIGH (ref 70–99)
Potassium: 4.4 meq/L (ref 3.5–5.1)
Sodium: 136 meq/L (ref 135–145)

## 2024-11-13 LAB — D-DIMER, QUANTITATIVE: D-Dimer, Quant: 0.41 ug{FEU}/mL

## 2024-11-13 LAB — HEMOGLOBIN A1C: Hgb A1c MFr Bld: 9.7 % — ABNORMAL HIGH (ref 4.6–6.5)

## 2024-11-13 MED ORDER — TOUJEO SOLOSTAR 300 UNIT/ML ~~LOC~~ SOPN: 20.0000 [IU] | PEN_INJECTOR | Freq: Every day | SUBCUTANEOUS | 0 refills | Status: AC

## 2024-11-13 MED ORDER — FREESTYLE LIBRE 3 PLUS SENSOR MISC: 1.0000 | 1 refills | Status: AC

## 2024-11-13 MED ORDER — INSULIN PEN NEEDLE 32G X 6 MM MISC: 1.0000 | Freq: Every day | 0 refills | Status: AC

## 2024-11-13 NOTE — Progress Notes (Signed)
 "  Subjective:  Patient ID: [name], male    DOB: [birth year]  Age: 69 y.o. MRN: [id number]  CC: Hyperlipidemia, Diabetes, Hypertension, Follow-up (6 month f/u), and Cough   HPI [name] presents for f/up --   Discussed the use of AI scribe software for clinical note transcription with the patient, who gave verbal consent to proceed.  History of Present Illness [name] is a 69 year old male who presents with symptoms following a recent episode of bronchitis.  He experienced an episode of bronchitis at the end of December and the beginning of January. Due to an insurance change, he delayed seeking treatment and eventually went to urgent care, where he was prescribed antibiotics and steroids. The steroids caused his blood sugar levels to become unstable, and he is still working to manage them.  He experiences a dry mouth every morning and keeps a bottle of water by his bedside. He does not report excessive thirst or urination.  He has been using a rescue inhaler frequently during the bronchitis episode, alternating with his regular inhaler. He describes his breathing as 'back and forth' and has been coughing up white phlegm, which he notes does not appear infected.  He experienced chest pain while cleaning his porch and helping a neighbor with his driveway, describing it as occurring in the lung area and associated with heavy breathing. No cold sweats or clamminess during these episodes. He monitors his oxygen  levels, which have remained high, reaching 99% at one point.  He has not had a chest x-ray at urgent care. He mentions a past lung biopsy that revealed scar tissue from previous pneumonia, and he is concerned about potential new scar tissue due to the recent bronchitis.     Outpatient Medications Prior to Visit  Medication Sig Dispense Refill   albuterol  (VENTOLIN  HFA) 108 (90 Base) MCG/ACT inhaler Inhale 1-2 puffs into the lungs every 6 (six) hours as needed  for wheezing or shortness of breath. 8 g 2   aspirin 81 MG EC tablet Take 81 mg by mouth at bedtime.     Blood Glucose Monitoring Suppl (ONETOUCH VERIO) w/Device KIT 1 Act by Does not apply route in the morning and at bedtime. 1 kit 2   Carboxymethylcellul-Glycerin (LUBRICATING EYE DROPS OP) Place 1 drop into both eyes daily as needed (dry eyes).     [employer] D3 50 MCG (2000 UT) CAPS TAKE 2 CAPSULES (4,000 UNITS TOTAL) BY MOUTH DAILY. 180 capsule 1   ezetimibe  (ZETIA ) 10 MG tablet TAKE 1 TABLET BY MOUTH EVERY DAY 90 tablet 1   fexofenadine (ALLEGRA) 180 MG tablet Take 180 mg by mouth daily.     glucose blood (ONETOUCH VERIO) test strip 1 each by Other route in the morning and at bedtime. Use as instructed 200 each 3   Lancets (ONETOUCH ULTRASOFT) lancets      meloxicam  (MOBIC ) 15 MG tablet Take 1 tablet daily for 2 weeks.  If still in pain after 2 weeks, take 1 tablet daily for an additional 1 week. 30 tablet 0   metFORMIN  (GLUCOPHAGE -XR) 750 MG 24 hr tablet TAKE 2 TABLETS (1,500 MG TOTAL) BY MOUTH EVERY DAY WITH BREAKFAST 180 tablet 1   Misc Natural Products (BEET ROOT PO) Take by mouth.     NON FORMULARY Oxygen  continuous 1.5 liters with activity     olmesartan  (BENICAR ) 20 MG tablet TAKE 1 TABLET BY MOUTH EVERY DAY 90 tablet 1   sodium chloride (OCEAN)  0.65 % SOLN nasal spray Place 1 spray into both nostrils as needed for congestion.     Tiotropium Bromide -Olodaterol (STIOLTO RESPIMAT ) 2.5-2.5 MCG/ACT AERS INHALE 2 PUFFS BY MOUTH INTO THE LUNGS DAILY 12 g 1   famotidine  (PEPCID ) 40 MG tablet TAKE 1 TABLET BY MOUTH EVERY DAY (Patient not taking: Reported on 11/13/2024) 90 tablet 1   ONETOUCH ULTRA test strip USE TO TEST UP TO 4 TIMES DAILY AS DIRECTED (Patient not taking: Reported on 11/13/2024) 100 strip 5   ONETOUCH VERIO test strip USE 1 PER DAY (Patient not taking: Reported on 11/13/2024) 100 each 2   No facility-administered medications prior to visit.    ROS Review of Systems   Constitutional:  Negative for appetite change, chills, diaphoresis, fatigue and fever.  HENT: Negative.    Eyes: Negative.   Respiratory:  Positive for cough and shortness of breath. Negative for chest tightness and wheezing.   Cardiovascular:  Positive for chest pain. Negative for palpitations and leg swelling.  Gastrointestinal:  Negative for abdominal pain, constipation, diarrhea, nausea and vomiting.  Genitourinary: Negative.  Negative for difficulty urinating.  Musculoskeletal: Negative.  Negative for arthralgias and myalgias.  Skin: Negative.  Negative for color change and rash.  Neurological:  Negative for dizziness, weakness, light-headedness and headaches.  Hematological:  Negative for adenopathy. Does not bruise/bleed easily.  Psychiatric/Behavioral: Negative.      Objective:  BP 132/80   Pulse (!) 102   Temp (!) 97.3 F (36.3 C) (Oral)   Ht 5' 9 (1.753 m)   Wt 206 lb 3.2 oz (93.5 kg)   SpO2 96%   BMI 30.45 kg/m   BP Readings from Last 3 Encounters:  11/13/24 132/80  09/03/24 130/82  08/11/24 (!) 152/86    Wt Readings from Last 3 Encounters:  11/13/24 206 lb 3.2 oz (93.5 kg)  09/03/24 209 lb (94.8 kg)  08/11/24 207 lb 3.2 oz (94 kg)    Physical Exam Vitals reviewed.  Constitutional:      General: He is not in acute distress.    Appearance: Normal appearance. He is not toxic-appearing or diaphoretic.  HENT:     Head: Atraumatic.     Nose: Nose normal.     Mouth/Throat:     Mouth: Mucous membranes are moist.  Eyes:     General: No scleral icterus.    Conjunctiva/sclera: Conjunctivae normal.  Cardiovascular:     Rate and Rhythm: Regular rhythm. Tachycardia present.     Pulses: Normal pulses.     Heart sounds: No murmur heard.    No friction rub. No gallop.     Comments: EKG--- NSR, 98 bpm Minimal LVH - new Anterior infarct pattern is not new No Q waves Pulmonary:     Effort: Pulmonary effort is normal. No respiratory distress.     Breath sounds:  No stridor. Examination of the right-lower field reveals rales. Examination of the left-lower field reveals rales. Rales present. No decreased breath sounds, wheezing or rhonchi.  Abdominal:     General: Abdomen is flat. Bowel sounds are normal.     Palpations: There is no mass.     Tenderness: There is no abdominal tenderness. There is no guarding.  Musculoskeletal:        General: Normal range of motion.     Cervical back: Neck supple.     Right lower leg: No edema.     Left lower leg: No edema.  Lymphadenopathy:     Cervical: No cervical adenopathy.  Skin:    General: Skin is warm and dry.     Findings: No rash.  Neurological:     General: No focal deficit present.     Mental Status: He is alert.  Psychiatric:        Mood and Affect: Mood normal.        Behavior: Behavior normal.     Lab Results  Component Value Date   WBC 10.0 08/04/2024   HGB 13.7 08/04/2024   HCT 40.4 08/04/2024   PLT 273 08/04/2024   GLUCOSE 220 (H) 11/13/2024   CHOL 197 11/13/2024   TRIG 241.0 (H) 11/13/2024   HDL 40.00 11/13/2024   LDLDIRECT 117.0 04/26/2022   LDLCALC 109 (H) 11/13/2024   ALT 26 02/12/2024   AST 22 02/12/2024   NA 136 11/13/2024   K 4.4 11/13/2024   CL 104 11/13/2024   CREATININE 0.83 11/13/2024   BUN 12 11/13/2024   CO2 23 11/13/2024   TSH 2.08 02/12/2024   PSA 0.53 02/12/2024   INR 1.2 08/04/2008   HGBA1C 9.7 (H) 11/13/2024   MICROALBUR <0.7 11/13/2024    DG Chest Port 1 View Result Date: 08/04/2024 EXAM: 1 VIEW XRAY OF THE CHEST 08/04/2024 11:28:00 AM COMPARISON: 02/12/2024 CLINICAL HISTORY: [id number] S/P bronchoscopy with biopsy [id number]. S/P bronchoscopy with biopsy S/P bronchoscopy with biopsy FINDINGS: LUNGS AND PLEURA: Emphysema. Asymmetric elevation of the right hemidiaphragm. Right upper lobe opacities are similar to the previous exam. No pulmonary edema. No pleural effusion. No pneumothorax identified. Status post bronchoscopy. HEART AND MEDIASTINUM: Stable  cardiac enlargement. Aortic atherosclerosis. BONES AND SOFT TISSUES: No acute osseous abnormality. IMPRESSION: 1. Right upper lobe opacities, similar to the previous exam. 2. No pneumothorax identified status post bronchoscopy. Electronically signed by: Waddell Calk MD 08/04/2024 12:28 PM EDT RP Workstation: [id number]   DG C-ARM BRONCHOSCOPY Result Date: 08/04/2024 C-ARM BRONCHOSCOPY: Fluoroscopy was utilized by the requesting physician.  No radiographic interpretation.   DG C-Arm 1-60 Min-No Report Result Date: 08/04/2024 Fluoroscopy was utilized by the requesting physician.  No radiographic interpretation.    DG Chest 2 View Result Date: 11/13/2024 EXAM: 2 VIEW(S) XRAY OF THE CHEST 11/13/2024 12:04:27 PM COMPARISON: 08/04/2024 CLINICAL HISTORY: Cough. FINDINGS: LUNGS AND PLEURA: Stable chronic bilateral interstitial prominence, consistent with emphysema . Persistent right upper lobe cavitary process measuring 3.1 cm. No pleural effusion. No pneumothorax. HEART AND MEDIASTINUM: No acute abnormality of the cardiac and mediastinal silhouettes. BONES AND SOFT TISSUES: No acute osseous abnormality. IMPRESSION: 1. Persistent 3 cm right upper lobe cavitary process. While this may reflect an atypical inflammatory or infectious process, an underlying malignancy cannot be excluded. See results from bronchoscopy and biopsy performed 08/04/2024. 2. Chronic interstitial coarsening compatible with emphysema. 3. No signs of pleural effusion, pneumothorax, or acute airspace consolidation. Electronically signed by: Waddell Calk MD 11/13/2024 01:23 PM EST RP Workstation: [id number]     Assessment & Plan:  EMPHYSEMA -     Pulmonary Visit  INTERSTITIAL LUNG DISEASE -     Pulmonary Visit  Statin myopathy- He will not take a statin  Pure hyperglyceridemia -     Lipid panel; Future  Essential hypertension, benign -     Basic metabolic panel with GFR; Future -     Urinalysis, Routine w reflex microscopic;  Future  Screening for colon cancer -     Cologuard  Subacute cough -     DG Chest 2 View; Future  Tachycardia -     EKG 12-Lead -  D-dimer, quantitative; Future -     Troponin I (High Sensitivity); Future  Insulin -requiring or dependent type II diabetes mellitus (HCC)- A1C is up to 9.7%. Will start basal insulin . -     AMB Referral [facility] Care Management -     Toujeo  SoloStar; Inject 20 Units into the skin daily.  Dispense: 6 mL; Refill: 0 -     Insulin  Pen Needle; 1 Act by Does not apply route daily.  Dispense: 100 each; Refill: 0 -     FreeStyle Libre 3 Plus Sensor; Apply 1 Act topically every 14 (fourteen) days. Change sensor every 15 days.  Dispense: 6 each; Refill: 1     Follow-up: Return in about 4 months (around 03/13/2025).  Debby Molt, MD "

## 2024-11-13 NOTE — Patient Instructions (Signed)

## 2024-11-14 ENCOUNTER — Telehealth: Payer: Self-pay

## 2024-11-14 DIAGNOSIS — E119 Type 2 diabetes mellitus without complications: Secondary | ICD-10-CM

## 2024-11-14 MED ORDER — LANCETS MISC: 1.0000 | 0 refills | Status: AC

## 2024-11-14 MED ORDER — BLOOD GLUCOSE MONITORING SUPPL DEVI: 1.0000 | 0 refills | Status: AC

## 2024-11-14 MED ORDER — BLOOD GLUCOSE TEST VI STRP: 1.0000 | ORAL_STRIP | 0 refills | Status: AC

## 2024-11-14 MED ORDER — LANCET DEVICE MISC: 1.0000 | 0 refills | Status: AC

## 2024-11-14 NOTE — Telephone Encounter (Signed)
 Please call the patient ? He is waiting so that he can be scheduled.

## 2024-11-14 NOTE — Telephone Encounter (Signed)
 Can you help me with the blood sugar meter. I will handle everything else.

## 2024-11-14 NOTE — Progress Notes (Signed)
 Care Guide Pharmacy Note  11/14/2024 Name: [name] MRN: [id number] DOB: 07-16-1956  Referred By: Joshua Debby CROME, MD Reason for referral: Call Attempt #1, Complex Care Management (Outreach to sch ref w/ pharm/), and Call Attempt #2 (Outreach to sch ref w/ pharm)   [name] is a 69 y.o. year old male who is a primary care patient of Joshua Debby CROME, MD.  [name] was referred to the pharmacist for assistance related to: COPD and DMII  Successful contact was made with the patient to discuss pharmacy services including being ready for the pharmacist to call at least 5 minutes before the scheduled appointment time and to have medication bottles and any blood pressure readings ready for review. The patient agreed to meet with the pharmacist via telephone visit on 11/27/24.  Doyce Razor [facility], [facility] Guide Direct Dial: [phone]  Fax: [phone]

## 2024-11-14 NOTE — Progress Notes (Signed)
 Care Guide Pharmacy Note  11/14/2024 Name: [name] MRN: [id number] DOB: 1956/06/19  Referred By: Joshua Debby CROME, MD Reason for referral: Call Attempt #1 and Complex Care Management (Outreach to sch ref w/ pharm/)   [name] is a 69 y.o. year old male who is a primary care patient of Joshua Debby CROME, MD.  [name] was referred to the pharmacist for assistance related to: COPD and DMII  An unsuccessful telephone outreach was attempted today to contact the patient who was referred to the pharmacy team for assistance with medication management. Additional attempts will be made to contact the patient.  Doyce Razor [facility], [facility] Guide Direct Dial: [phone]  Fax: [phone]

## 2024-11-14 NOTE — Telephone Encounter (Signed)
 Copied from CRM #[id number]. Topic: Clinical - Medical Advice >> Nov 14, 2024 10:31 AM Harlene ORN wrote: Reason for CRM: Patient called. Needs a reader to read his blood sugar; his phone is not the latest model so will need a traditional reader. Also he cancelled the cologuard test, and will need the PCP to cancel the order. Please call the patient to discuss further if needed.

## 2024-11-14 NOTE — Addendum Note (Signed)
 Addended by: MERCEDA GRACES R on: 11/14/2024 02:12 PM   Modules accepted: Orders

## 2024-11-16 DIAGNOSIS — R9431 Abnormal electrocardiogram [ECG] [EKG]: Secondary | ICD-10-CM | POA: Insufficient documentation

## 2024-11-16 DIAGNOSIS — E119 Type 2 diabetes mellitus without complications: Secondary | ICD-10-CM | POA: Insufficient documentation

## 2024-11-18 ENCOUNTER — Ambulatory Visit: Admitting: Acute Care

## 2024-11-20 ENCOUNTER — Ambulatory Visit
Admission: RE | Admit: 2024-11-20 | Discharge: 2024-11-20 | Disposition: A | Source: Ambulatory Visit | Attending: Acute Care | Admitting: Acute Care

## 2024-11-20 DIAGNOSIS — R911 Solitary pulmonary nodule: Secondary | ICD-10-CM

## 2024-11-27 ENCOUNTER — Ambulatory Visit

## 2024-12-03 ENCOUNTER — Ambulatory Visit (HOSPITAL_COMMUNITY)

## 2024-12-04 ENCOUNTER — Ambulatory Visit: Admitting: Pulmonary Disease

## 2024-12-31 ENCOUNTER — Ambulatory Visit

## 2025-01-01 ENCOUNTER — Ambulatory Visit

## 2025-03-16 ENCOUNTER — Ambulatory Visit: Admitting: Internal Medicine
